# Patient Record
Sex: Male | Born: 1960 | Race: White | Hispanic: No | Smoking: Never smoker
Health system: Southern US, Community
[De-identification: ages and names within clinical notes are randomized; demographics above are authoritative.]

## PROBLEM LIST (undated history)

## (undated) DIAGNOSIS — Z972 Presence of dental prosthetic device (complete) (partial): Secondary | ICD-10-CM

## (undated) DIAGNOSIS — A0472 Enterocolitis due to Clostridium difficile, not specified as recurrent: Secondary | ICD-10-CM

## (undated) DIAGNOSIS — G2581 Restless legs syndrome: Secondary | ICD-10-CM

## (undated) DIAGNOSIS — F419 Anxiety disorder, unspecified: Secondary | ICD-10-CM

## (undated) DIAGNOSIS — F0390 Unspecified dementia without behavioral disturbance: Secondary | ICD-10-CM

## (undated) DIAGNOSIS — J449 Chronic obstructive pulmonary disease, unspecified: Secondary | ICD-10-CM

## (undated) DIAGNOSIS — D649 Anemia, unspecified: Secondary | ICD-10-CM

## (undated) DIAGNOSIS — I10 Essential (primary) hypertension: Secondary | ICD-10-CM

## (undated) DIAGNOSIS — Z8679 Personal history of other diseases of the circulatory system: Secondary | ICD-10-CM

## (undated) DIAGNOSIS — R0600 Dyspnea, unspecified: Secondary | ICD-10-CM

## (undated) DIAGNOSIS — C61 Malignant neoplasm of prostate: Secondary | ICD-10-CM

## (undated) HISTORY — PX: HERNIA REPAIR: SHX51

---

## 2013-02-04 ENCOUNTER — Emergency Department (HOSPITAL_COMMUNITY): Payer: Self-pay

## 2013-02-04 ENCOUNTER — Encounter (HOSPITAL_COMMUNITY): Payer: Self-pay | Admitting: *Deleted

## 2013-02-04 ENCOUNTER — Emergency Department (HOSPITAL_COMMUNITY)
Admission: EM | Admit: 2013-02-04 | Discharge: 2013-02-04 | Disposition: A | Discharge status: Home / Self Care | Payer: Self-pay | Attending: Emergency Medicine | Admitting: Emergency Medicine

## 2013-02-04 DIAGNOSIS — S42023A Displaced fracture of shaft of unspecified clavicle, initial encounter for closed fracture: Secondary | ICD-10-CM | POA: Insufficient documentation

## 2013-02-04 DIAGNOSIS — S42002A Fracture of unspecified part of left clavicle, initial encounter for closed fracture: Secondary | ICD-10-CM

## 2013-02-04 DIAGNOSIS — Y939 Activity, unspecified: Secondary | ICD-10-CM | POA: Insufficient documentation

## 2013-02-04 DIAGNOSIS — W11XXXA Fall on and from ladder, initial encounter: Secondary | ICD-10-CM | POA: Insufficient documentation

## 2013-02-04 DIAGNOSIS — S62102A Fracture of unspecified carpal bone, left wrist, initial encounter for closed fracture: Secondary | ICD-10-CM

## 2013-02-04 DIAGNOSIS — Y929 Unspecified place or not applicable: Secondary | ICD-10-CM | POA: Insufficient documentation

## 2013-02-04 DIAGNOSIS — S52599A Other fractures of lower end of unspecified radius, initial encounter for closed fracture: Secondary | ICD-10-CM | POA: Insufficient documentation

## 2013-02-04 IMAGING — CR DG FOREARM 2V*L*
2 series · 2 of 2 positions shown · non-contrast
Comparison: None.

CLINICAL DATA: Fall, left forearm pain, deformity.

LEFT FOREARM - 2 VIEW

[x forearm ap left]
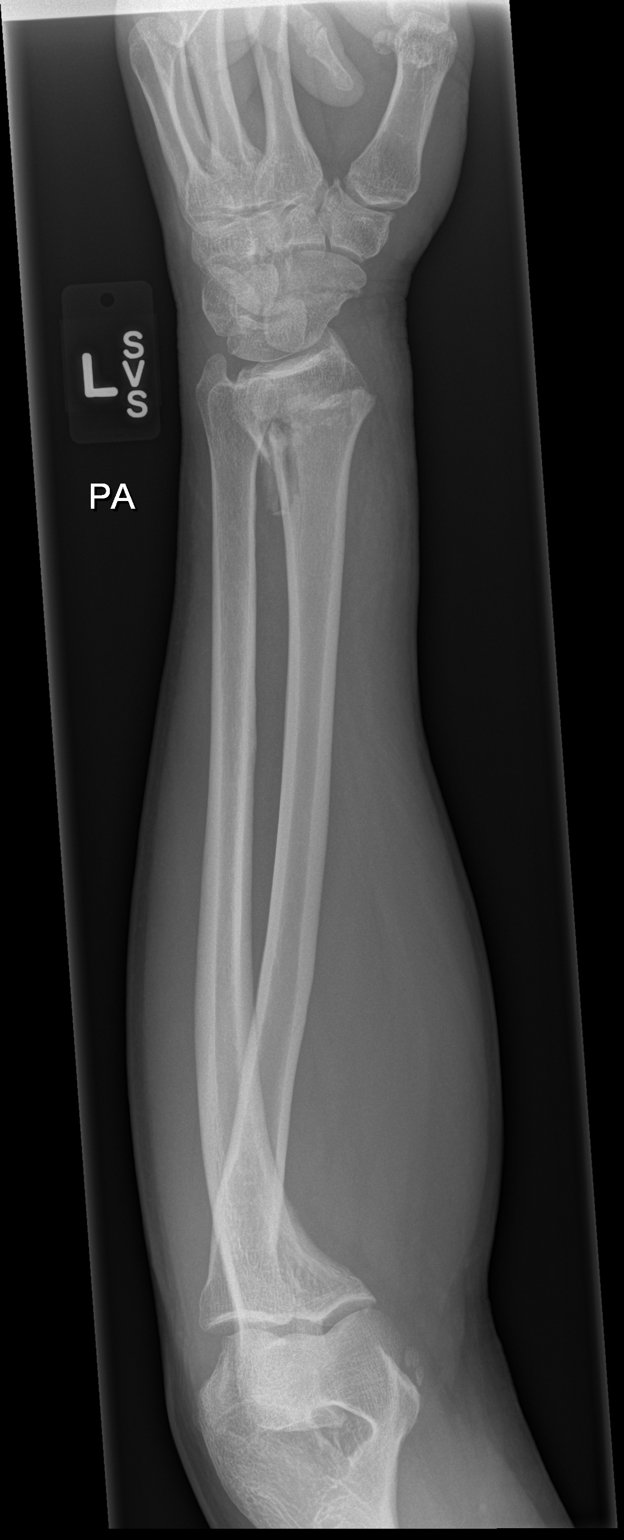

[x forearm lat left]
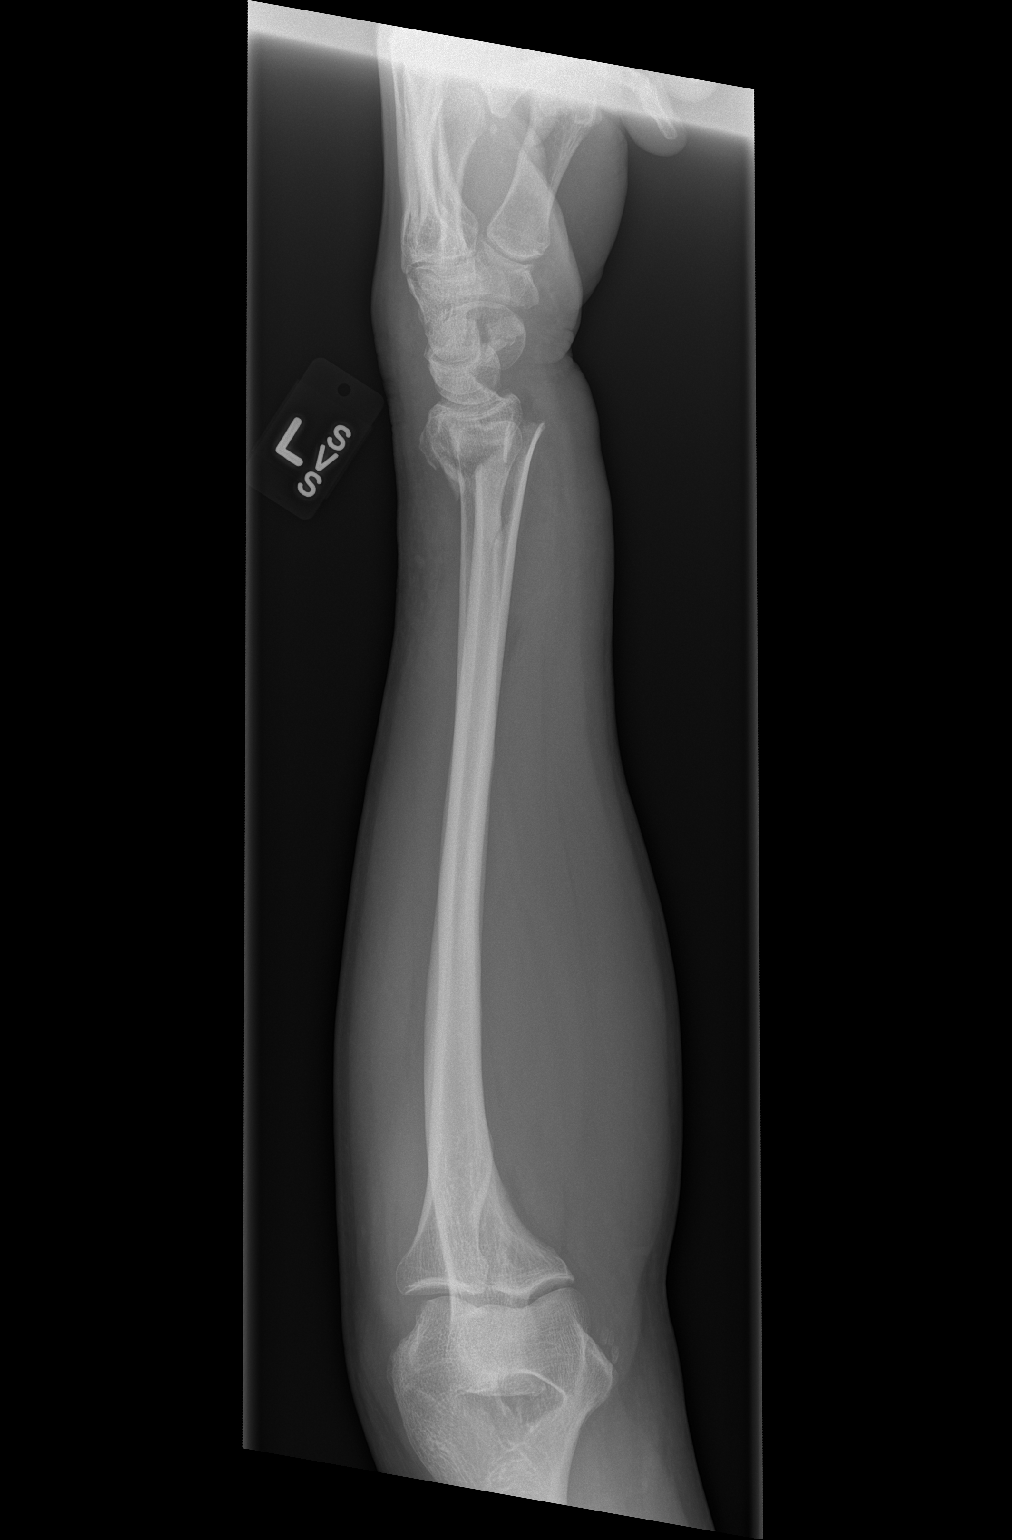

[2 of 2 positions shown; findings below may reference images not displayed]

FINDINGS: Comminuted, displaced and impacted fracture through the
distal left radius.  Distal fragment is displaced posteriorly
approximately 9 mm.  No visible ulnar abnormality.
IMPRESSION: Displaced, comminuted and impacted distal left radial fracture.

## 2013-02-04 IMAGING — CR DG CHEST 1V
1 series · 1 of 1 positions shown · non-contrast
Comparison: None.

***ADDENDUM*** CREATED: [DATE] [DATE]

After discussing the case with the clinician, the patient is tender
over the medial left clavicle.  While difficult to visualize, there
is a displaced fracture through the medial left clavicle.
***END ADDENDUM*** SIGNED BY: TIGER, M.D.
CLINICAL DATA: Fall, chest pain.  Arm injury.
CHEST - 1 VIEW

[x chest ap]
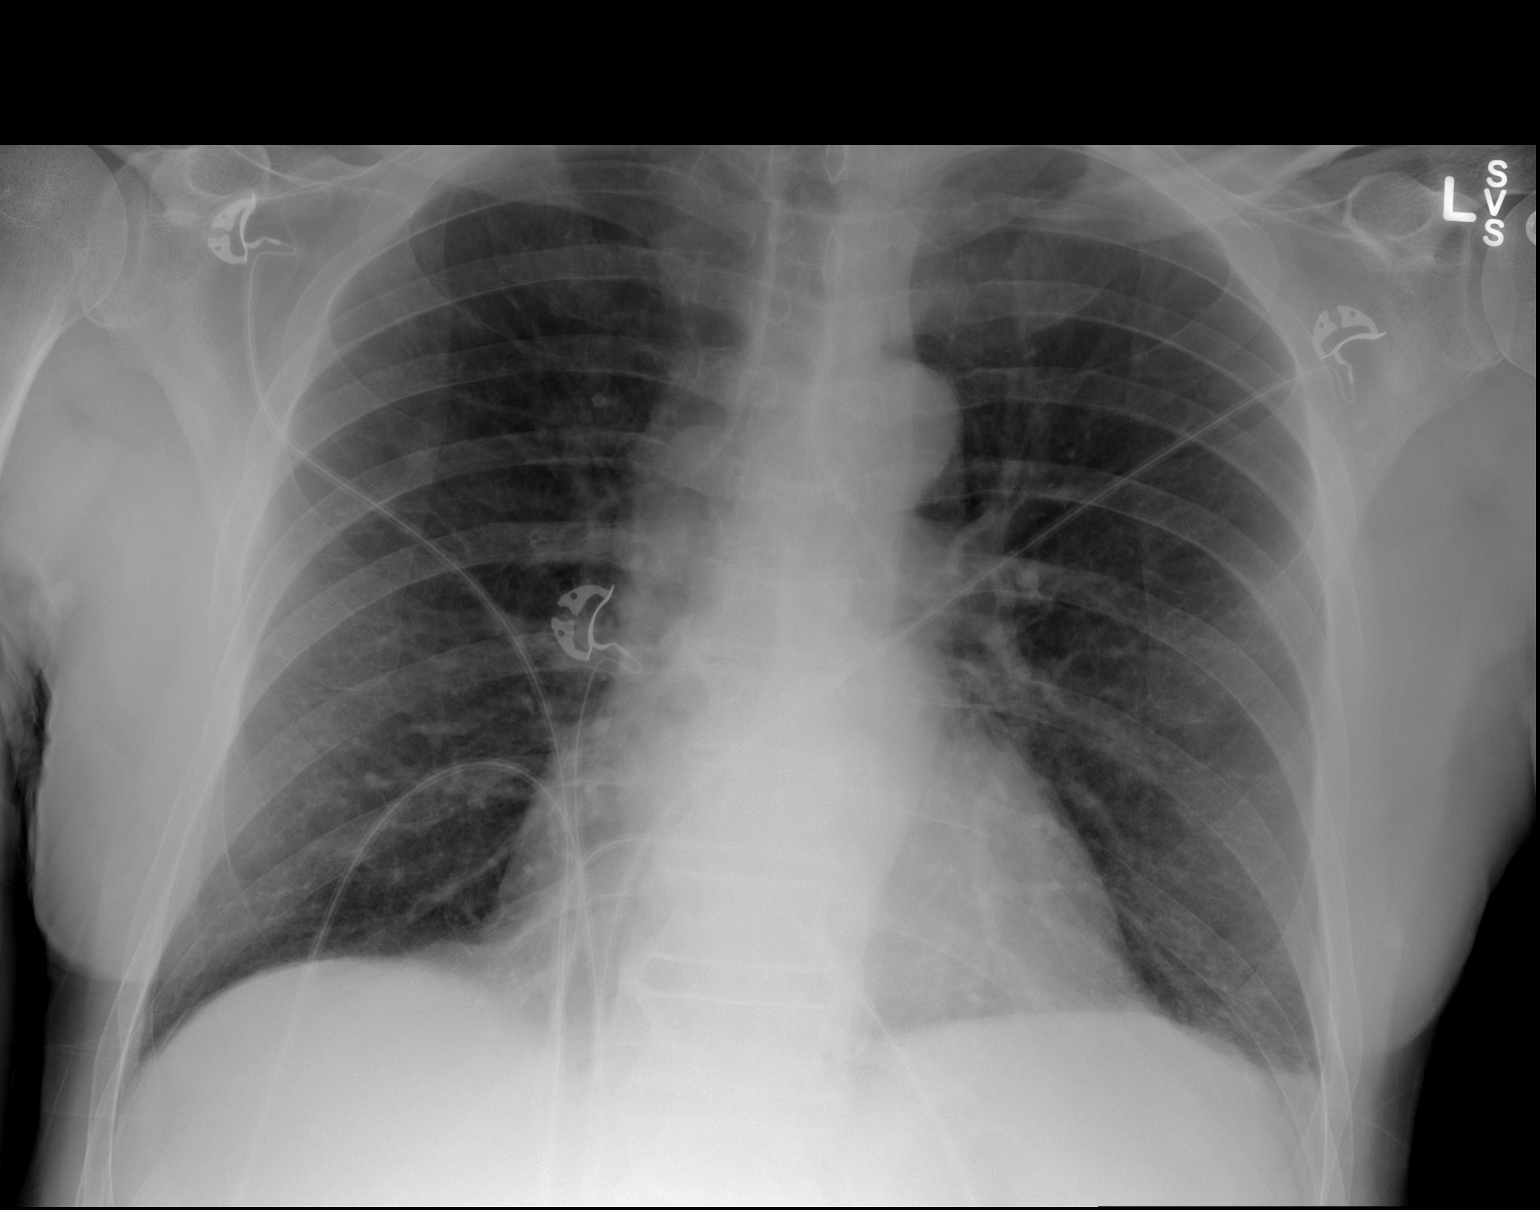

[1 of 1 positions shown; findings below may reference images not displayed]

FINDINGS: Heart is normal size.  No confluent airspace opacities or
effusions.  No acute bony abnormality.  No visualized rib fracture
or pneumothorax.
IMPRESSION: No acute cardiopulmonary disease.

## 2013-02-04 MED ORDER — OXYCODONE-ACETAMINOPHEN 5-325 MG PO TABS: 2.0000 | ORAL_TABLET | ORAL | Status: DC | PRN

## 2013-02-04 MED ORDER — OXYCODONE-ACETAMINOPHEN 5-325 MG PO TABS
2.0000 | ORAL_TABLET | Freq: Once | ORAL | Status: AC
  Administered 2013-02-04: 2 via ORAL
  Filled 2013-02-04: qty 2

## 2013-02-04 NOTE — ED Provider Notes (Signed)
History    CSN: [id number] Arrival date & time 02/04/13  2145  First MD Initiated Contact with Patient 02/04/13 2211     Chief Complaint  Patient presents with  . Fall  . Arm Injury    HPI Patient fell back off an 8 foot ladder and landed on left side.  Has deformity and pain to left forearm/wrist.  Also has some tenderness the left clavicle.  Patient denies shortness of breath abdominal pain denies head injury .  Denies spine or neck pain.  Denies back pain.  Denies paresthesias. History reviewed. No pertinent past medical history. History reviewed. No pertinent past surgical history. No family history on file. History  Substance Use Topics  . Smoking status: Never Smoker   . Smokeless tobacco: Not on file  . Alcohol Use: Yes     Comment: social    Review of Systems All other systems reviewed and are negative Allergies  Review of patient's allergies indicates no known allergies.  Home Medications   Current Outpatient Rx  Name  Route  Sig  Dispense  Refill  . ibuprofen (ADVIL,MOTRIN) 200 MG tablet   Oral   Take 600 mg by mouth every 6 (six) hours as needed for pain.         Marland Kitchen oxyCODONE-acetaminophen (PERCOCET/ROXICET) 5-325 MG per tablet   Oral   Take 2 tablets by mouth every 4 (four) hours as needed for pain.   30 tablet   0    BP 155/102  Pulse 70  Temp(Src) 97.9 F (36.6 C)  Resp 20  Wt 183 lb (83.008 kg)  SpO2 95% Physical Exam  Nursing note and vitals reviewed. Constitutional: He is oriented to person, place, and time. He appears well-developed and well-nourished. No distress.  HENT:  Head: Normocephalic and atraumatic.  Eyes: Pupils are equal, round, and reactive to light.  Neck: Normal range of motion.  Cardiovascular: Normal rate and intact distal pulses.   Pulmonary/Chest: No respiratory distress.  Abdominal: Normal appearance. He exhibits no distension.  Musculoskeletal:       Left shoulder: He exhibits swelling (Swelling and pain where  indicated).       Left wrist: He exhibits decreased range of motion, bony tenderness, swelling and deformity. He exhibits no laceration.       Arms: Neurological: He is alert and oriented to person, place, and time. No cranial nerve deficit.  Skin: Skin is warm and dry. No rash noted.  Psychiatric: He has a normal mood and affect. His behavior is normal.    ED Course  Procedures (including critical care time) Medications  oxyCODONE-acetaminophen (PERCOCET/ROXICET) 5-325 MG per tablet 2 tablet (2 tablets Oral Given 02/04/13 2240)    Labs Reviewed - No data to display Dg Chest 1 View  02/04/2013   **ADDENDUM** CREATED: 02/04/2013 22:43:46  After discussing the case with the clinician, the patient is tender over the medial left clavicle.  While difficult to visualize, there is a displaced fracture through the medial left clavicle.  **END ADDENDUM** SIGNED BY: Aubery Lapping. [place], M.D.  02/04/2013   *RADIOLOGY REPORT*  Clinical Data: Fall, chest pain.  Arm injury.  CHEST - 1 VIEW  Comparison: None.  Findings: Heart is normal size.  No confluent airspace opacities or effusions.  No acute bony abnormality.  No visualized rib fracture or pneumothorax.  IMPRESSION: No acute cardiopulmonary disease.   Original Report Authenticated By: Charlett Nose, M.D.   Dg Forearm Left  02/04/2013   *RADIOLOGY REPORT*  Clinical Data: Fall, left forearm pain, deformity.  LEFT FOREARM - 2 VIEW  Comparison: None.  Findings: Comminuted, displaced and impacted fracture through the distal left radius.  Distal fragment is displaced posteriorly approximately 9 mm.  No visible ulnar abnormality.  IMPRESSION: Displaced, comminuted and impacted distal left radial fracture.   Original Report Authenticated By: Charlett Nose, M.D.   1. Wrist fracture, left, closed, initial encounter   2. Clavicle fracture, left, closed, initial encounter     MDM  I discussed the case with Dr. Janee Morn who is on-call for hand surgery.  He reviewed the x-rays.   Request to place the patient in a sugar tong splint and sling.  He will see him in 9 AM in the morning.  Nelia Shi, MD 02/04/13 [phone]

## 2013-02-04 NOTE — ED Notes (Signed)
Pt refused IV start multiple times. States " I don't need an IV for a broke arm ma'am." pt informed that his type of injury requires close monitoring and IV is needed for possible hidden injuries. Pt continued to refuse. States "let me see the doctor first before y'all start sticking me with needles."

## 2013-02-04 NOTE — ED Notes (Addendum)
Pt fell straight back 34ft after ladder moved; landed on butt/left side/back; c/o left arm pain-- deformity; denies numbness tingling down legs; denies neck pain-- c collar placed; Dr Dierdre Highman made aware of injuries

## 2013-02-04 NOTE — ED Notes (Signed)
MD at bedside. 

## 2013-02-25 NOTE — Progress Notes (Signed)
No meds-has never had any surgery

## 2013-02-27 NOTE — H&P (Signed)
Dan Salazar is an 52 y.o. male.   CC / Reason for Visit: Left wrist and shoulder injury HPI: This patient is a 52 year old male who presents for evaluation having fallen off of a ladder at home.  He was evaluated in the emergency department yesterday, placed into a sugar tong splint for his distal radius and a sling for his clavicle fracture.  He has been taking pain medications and reports no numbness or tingling in the left hand digits  No past medical history on file.  No past surgical history on file.  No family history on file. Social History:  reports that he has never smoked. He does not have any smokeless tobacco history on file. He reports that  drinks alcohol. His drug history is not on file.  Allergies: No Known Allergies  No prescriptions prior to admission    No results found for this or any previous visit (from the past 48 hour(s)). No results found.  Review of Systems  All other systems reviewed and are negative.    Height 6\' 2"  (1.88 m), weight 83.008 kg (183 lb). Physical Exam  Constitutional:  WD, WN, NAD HEENT:  NCAT, EOMI Neuro/Psych:  Alert & oriented to person, place, and time; appropriate mood & affect Lymphatic: No generalized UE edema or lymphadenopathy Extremities / MSK:  Both UE are normal with respect to appearance, ranges of motion, joint stability, muscle strength/tone, sensation, & perfusion except as otherwise noted:  There is some swelling and tenderness in the mid to medial aspects of the left clavicle.  Overriding can be appreciated but there is no skin in jeopardy.  Left sided sugar tong splint is intact, digits are not significantly swollen and intact light touch sensation across all the digital tips with intact motor to the same  Labs / Xrays:  No radiographic studies obtained today.  X-rays from the time of injury are reviewed and reveal a displaced fracture of the medial midportion of the clavicle, as well as a dorsally translated and  displaced distal radius fracture.  Roughly 50% dorsal translation and 10-15 degrees dorsal tilt is noted  Assessment: 1.  Displaced left clavicle fracture 2.  Displaced left distal radius fracture  Plan:  I discussed these findings with him.  I recommended nonoperative treatment for the clavicle, but recommended ORIF for the radius fracture.  Plastic models were used to describe the injury, the altered alignment, the consequences of such, and the recommended course of action.  At this point, this could not be successfully closed reduced without significant anesthesia, and if one were to undergo anesthesia for closed reduction, I would recommend at least percutaneous pin fixation if not open treatment.  The patient is reticent to undergo any type of operative treatment and would like to seek a second opinion.  I offered to provide names and numbers of hand surgeons who could be consulted for second opinion and he declined.  In addition acute median neuropathy symptoms were discussed and the importance of relieving this conveyed.  He was instructed to seek immediate assistance if he developed numbness and tingling in the fingers.  As it stands, we will attempt to schedule surgery for next Wednesday, which he may cancel if he is not ready to proceed at that time for whatever reason.   Cele Mote A. 02/27/2013, 3:18 PM

## 2013-03-02 ENCOUNTER — Encounter (HOSPITAL_BASED_OUTPATIENT_CLINIC_OR_DEPARTMENT_OTHER): Payer: Self-pay | Admitting: Certified Registered Nurse Anesthetist

## 2013-03-02 ENCOUNTER — Ambulatory Visit (HOSPITAL_BASED_OUTPATIENT_CLINIC_OR_DEPARTMENT_OTHER)
Admission: RE | Admit: 2013-03-02 | Discharge: 2013-03-02 | Disposition: A | Discharge status: Home / Self Care | Payer: Self-pay | Source: Ambulatory Visit | Attending: Orthopedic Surgery | Admitting: Orthopedic Surgery

## 2013-03-02 ENCOUNTER — Encounter (HOSPITAL_BASED_OUTPATIENT_CLINIC_OR_DEPARTMENT_OTHER)
Admission: RE | Disposition: A | Discharge status: Home / Self Care | Payer: Self-pay | Source: Ambulatory Visit | Attending: Orthopedic Surgery

## 2013-03-02 ENCOUNTER — Encounter (HOSPITAL_BASED_OUTPATIENT_CLINIC_OR_DEPARTMENT_OTHER): Payer: Self-pay

## 2013-03-02 ENCOUNTER — Ambulatory Visit (HOSPITAL_BASED_OUTPATIENT_CLINIC_OR_DEPARTMENT_OTHER): Payer: Self-pay | Admitting: Certified Registered Nurse Anesthetist

## 2013-03-02 ENCOUNTER — Ambulatory Visit (HOSPITAL_COMMUNITY): Payer: Self-pay

## 2013-03-02 DIAGNOSIS — S52599A Other fractures of lower end of unspecified radius, initial encounter for closed fracture: Secondary | ICD-10-CM | POA: Insufficient documentation

## 2013-03-02 DIAGNOSIS — W11XXXA Fall on and from ladder, initial encounter: Secondary | ICD-10-CM | POA: Insufficient documentation

## 2013-03-02 DIAGNOSIS — Y92009 Unspecified place in unspecified non-institutional (private) residence as the place of occurrence of the external cause: Secondary | ICD-10-CM | POA: Insufficient documentation

## 2013-03-02 HISTORY — PX: OPEN REDUCTION INTERNAL FIXATION (ORIF) DISTAL RADIAL FRACTURE: SHX5989

## 2013-03-02 IMAGING — RF DG WRIST COMPLETE 3+V*L*
1 series · 4 of 4 positions shown · non-contrast
Comparison: [DATE]

CLINICAL DATA: Fracture

DG C-ARM 61-120 MIN,LEFT WRIST - COMPLETE 3+ VIEW

[Series 1: run · 4 of 4 slices shown]
[im 1/4]
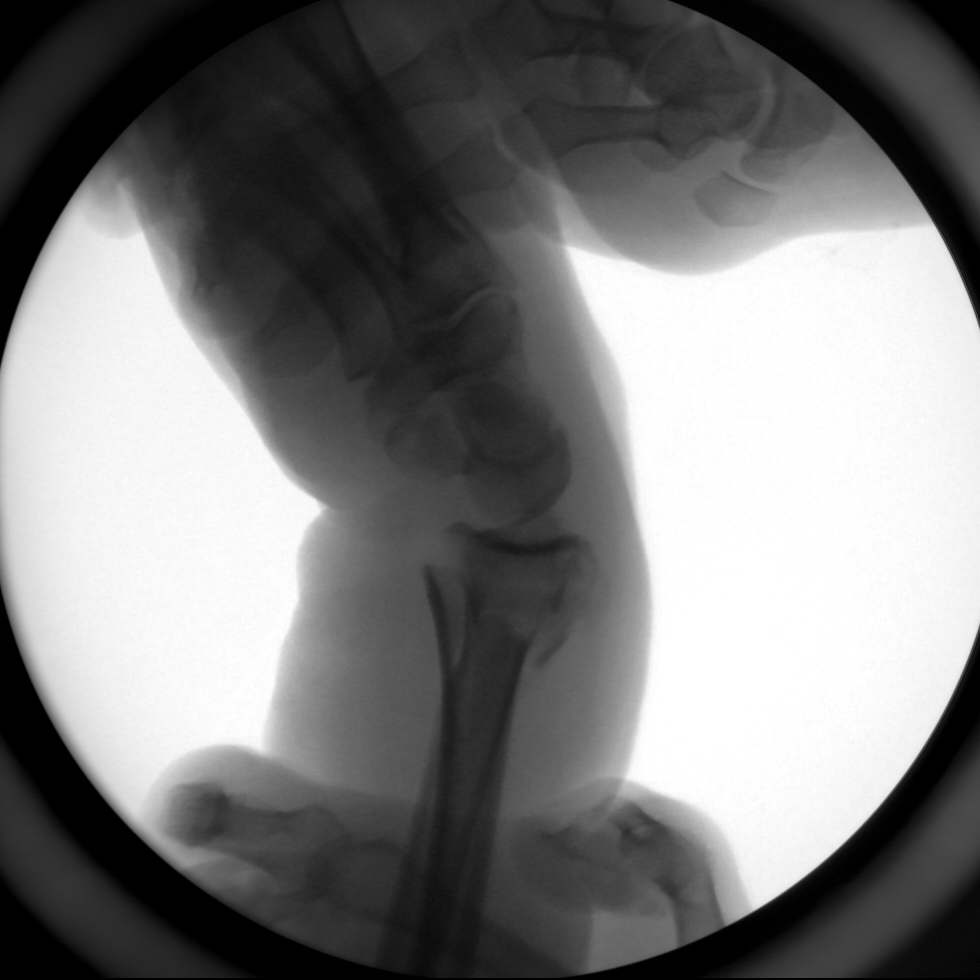
[im 2/4]
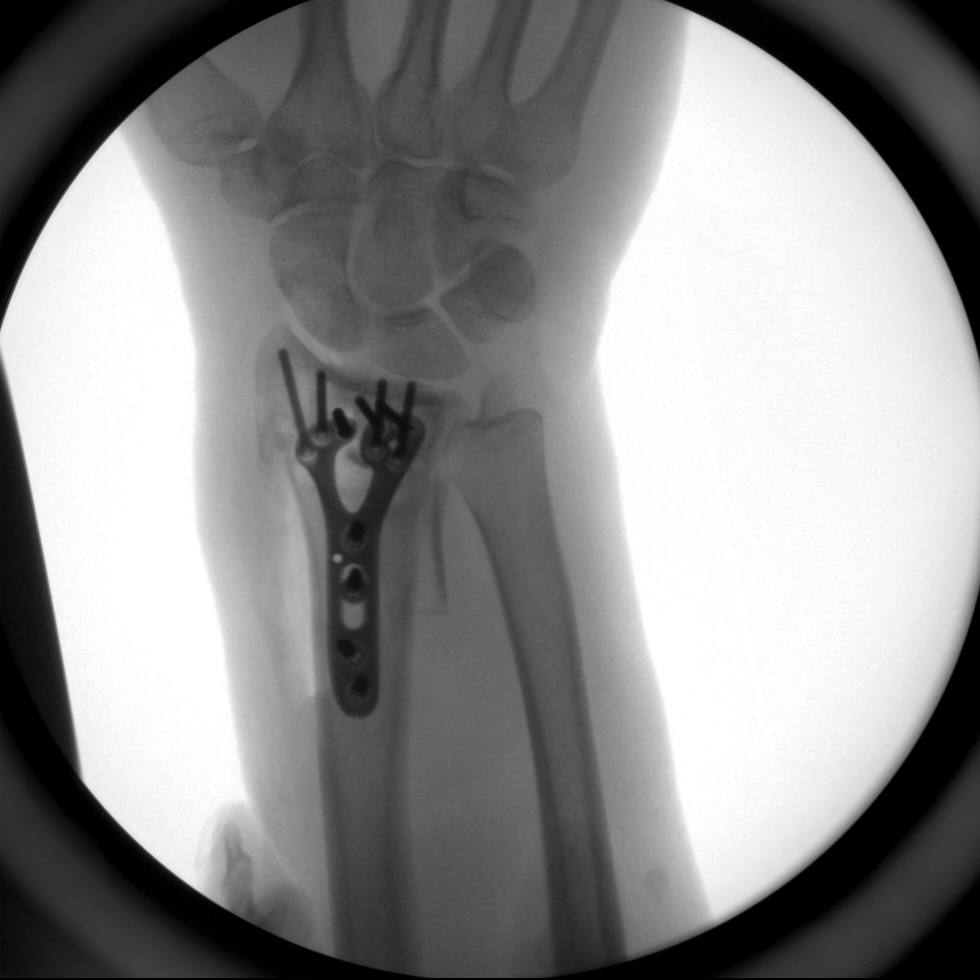
[im 3/4]
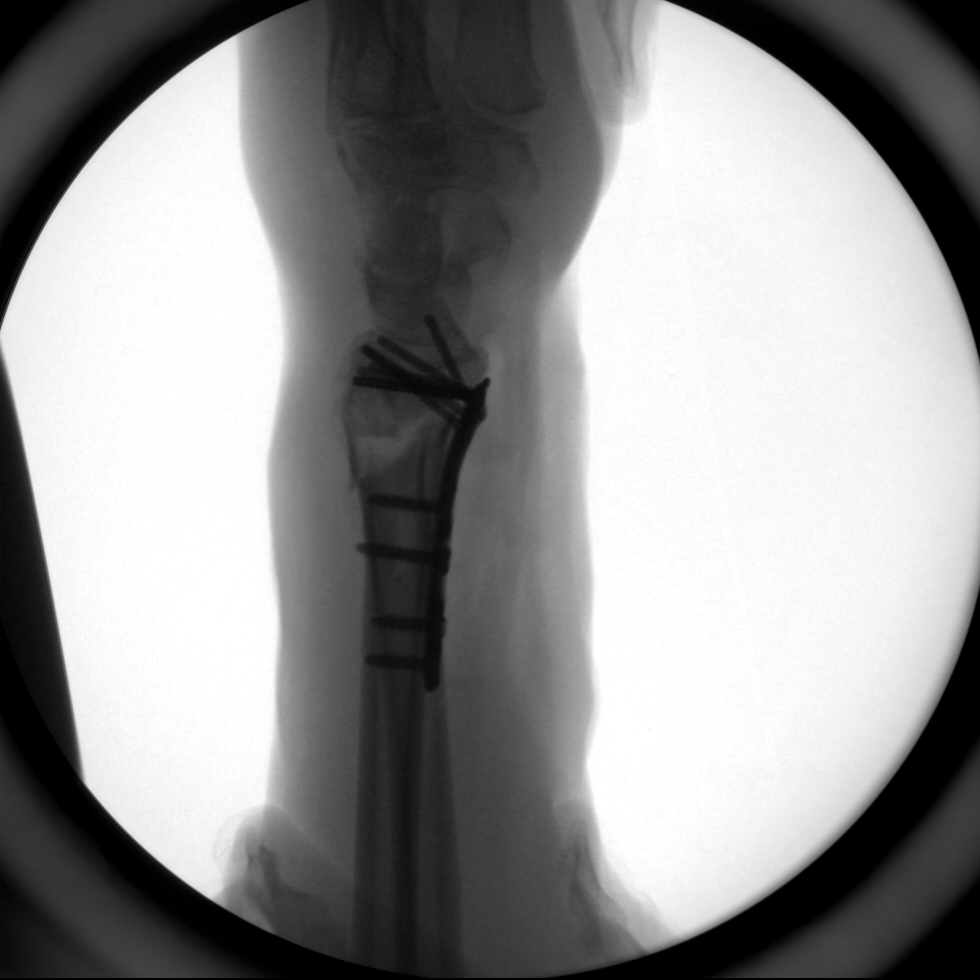
[im 4/4]
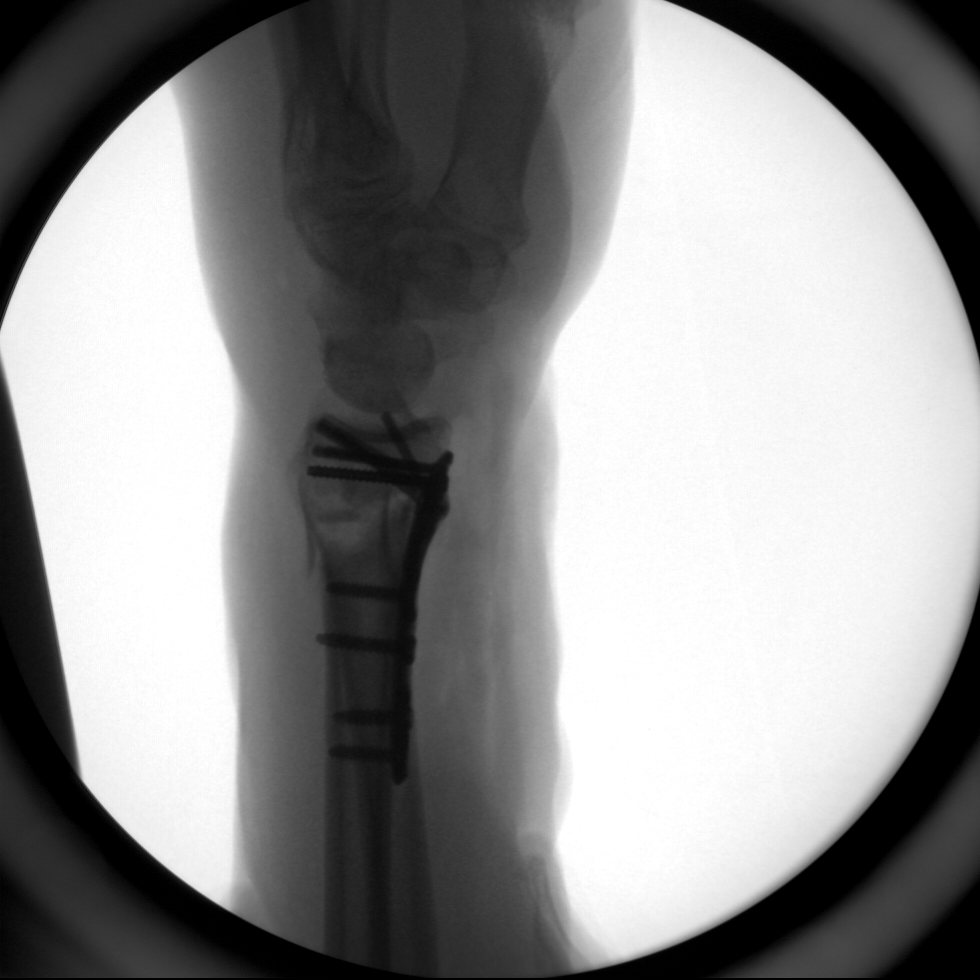

[4 of 4 positions shown; findings below may reference images not displayed]

FINDINGS: Four intraoperative C-arm spot images document reduction
of the comminuted distal radial fracture and fixation with plate
and screws, fragments projecting in near anatomic alignment.  Ulna
appears intact.
IMPRESSION: ORIF of distal radial fracture

## 2013-03-02 SURGERY — OPEN REDUCTION INTERNAL FIXATION (ORIF) DISTAL RADIUS FRACTURE
Anesthesia: General | Site: Wrist | Laterality: Left | Wound class: Clean

## 2013-03-02 MED ORDER — BUPIVACAINE-EPINEPHRINE 0.5% -1:200000 IJ SOLN
INTRAMUSCULAR | Status: DC | PRN
  Administered 2013-03-02: 10 mL

## 2013-03-02 MED ORDER — PROPOFOL 10 MG/ML IV BOLUS
INTRAVENOUS | Status: DC | PRN
  Administered 2013-03-02: 200 mg via INTRAVENOUS

## 2013-03-02 MED ORDER — HYDROMORPHONE HCL PF 1 MG/ML IJ SOLN
0.2500 mg | INTRAMUSCULAR | Status: DC | PRN
  Administered 2013-03-02 (×4): 0.5 mg via INTRAVENOUS

## 2013-03-02 MED ORDER — FENTANYL CITRATE 0.05 MG/ML IJ SOLN
INTRAMUSCULAR | Status: DC | PRN
  Administered 2013-03-02: 25 ug via INTRAVENOUS
  Administered 2013-03-02: 50 ug via INTRAVENOUS
  Administered 2013-03-02: 25 ug via INTRAVENOUS
  Administered 2013-03-02: 50 ug via INTRAVENOUS
  Administered 2013-03-02: 25 ug via INTRAVENOUS
  Administered 2013-03-02: 100 ug via INTRAVENOUS
  Administered 2013-03-02: 50 ug via INTRAVENOUS

## 2013-03-02 MED ORDER — FENTANYL CITRATE 0.05 MG/ML IJ SOLN: 50.0000 ug | INTRAMUSCULAR | Status: DC | PRN

## 2013-03-02 MED ORDER — LIDOCAINE HCL (CARDIAC) 20 MG/ML IV SOLN
INTRAVENOUS | Status: DC | PRN
  Administered 2013-03-02: 60 mg via INTRAVENOUS

## 2013-03-02 MED ORDER — LACTATED RINGERS IV SOLN
INTRAVENOUS | Status: DC
  Administered 2013-03-02 (×2): via INTRAVENOUS

## 2013-03-02 MED ORDER — DEXAMETHASONE SODIUM PHOSPHATE 10 MG/ML IJ SOLN
INTRAMUSCULAR | Status: DC | PRN
  Administered 2013-03-02: 10 mg via INTRAVENOUS

## 2013-03-02 MED ORDER — 0.9 % SODIUM CHLORIDE (POUR BTL) OPTIME
TOPICAL | Status: DC | PRN
  Administered 2013-03-02: 1000 mL

## 2013-03-02 MED ORDER — MIDAZOLAM HCL 2 MG/2ML IJ SOLN: 1.0000 mg | INTRAMUSCULAR | Status: DC | PRN

## 2013-03-02 MED ORDER — MIDAZOLAM HCL 5 MG/5ML IJ SOLN
INTRAMUSCULAR | Status: DC | PRN
  Administered 2013-03-02: 1 mg via INTRAVENOUS

## 2013-03-02 MED ORDER — ONDANSETRON HCL 4 MG/2ML IJ SOLN
INTRAMUSCULAR | Status: DC | PRN
  Administered 2013-03-02: 4 mg via INTRAVENOUS

## 2013-03-02 MED ORDER — OXYCODONE HCL 5 MG PO TABS
5.0000 mg | ORAL_TABLET | Freq: Once | ORAL | Status: AC | PRN
  Administered 2013-03-02: 5 mg via ORAL

## 2013-03-02 MED ORDER — OXYCODONE HCL 5 MG/5ML PO SOLN: 5.0000 mg | Freq: Once | ORAL | Status: AC | PRN

## 2013-03-02 MED ORDER — CEFAZOLIN SODIUM-DEXTROSE 2-3 GM-% IV SOLR
2.0000 g | INTRAVENOUS | Status: AC
  Administered 2013-03-02: 2 g via INTRAVENOUS

## 2013-03-02 MED ORDER — ONDANSETRON HCL 4 MG/2ML IJ SOLN: 4.0000 mg | Freq: Once | INTRAMUSCULAR | Status: DC | PRN

## 2013-03-02 MED ORDER — OXYCODONE-ACETAMINOPHEN 5-325 MG PO TABS: 1.0000 | ORAL_TABLET | ORAL | Status: DC | PRN

## 2013-03-02 SURGICAL SUPPLY — 53 items
BANDAGE COBAN STERILE 2 (GAUZE/BANDAGES/DRESSINGS) IMPLANT
BANDAGE GAUZE ELAST BULKY 4 IN (GAUZE/BANDAGES/DRESSINGS) ×4 IMPLANT
BLADE MINI RND TIP GREEN BEAV (BLADE) IMPLANT
BLADE SURG 15 STRL LF DISP TIS (BLADE) ×2 IMPLANT
BLADE SURG 15 STRL SS (BLADE) ×2
BNDG COHESIVE 4X5 TAN STRL (GAUZE/BANDAGES/DRESSINGS) ×2 IMPLANT
BNDG ESMARK 4X9 LF (GAUZE/BANDAGES/DRESSINGS) ×2 IMPLANT
BRUSH SCRUB EZ PLAIN DRY (MISCELLANEOUS) ×2 IMPLANT
CANISTER SUCTION 1200CC (MISCELLANEOUS) ×2 IMPLANT
CHLORAPREP W/TINT 26ML (MISCELLANEOUS) ×2 IMPLANT
CORDS BIPOLAR (ELECTRODE) ×2 IMPLANT
COVER MAYO STAND STRL (DRAPES) ×2 IMPLANT
COVER TABLE BACK 60X90 (DRAPES) ×2 IMPLANT
CUFF TOURNIQUET SINGLE 18IN (TOURNIQUET CUFF) ×2 IMPLANT
CUFF TOURNIQUET SINGLE 24IN (TOURNIQUET CUFF) IMPLANT
DRAPE C-ARM 42X72 X-RAY (DRAPES) ×2 IMPLANT
DRAPE EXTREMITY T 121X128X90 (DRAPE) ×2 IMPLANT
DRAPE SURG 17X23 STRL (DRAPES) ×2 IMPLANT
DRILL, SOLID SIDE CUTTING, 2.0MM X 40MM ×2 IMPLANT
DRILL, SOLID SIDE CUTTING, 2.5MM X 40MM ×2 IMPLANT
DRSG ADAPTIC 3X8 NADH LF (GAUZE/BANDAGES/DRESSINGS) ×2 IMPLANT
DRSG EMULSION OIL 3X3 NADH (GAUZE/BANDAGES/DRESSINGS) ×2 IMPLANT
ELECT REM PT RETURN 9FT ADLT (ELECTROSURGICAL) ×2
ELECTRODE REM PT RTRN 9FT ADLT (ELECTROSURGICAL) ×1 IMPLANT
GLOVE BIO SURGEON STRL SZ 6.5 (GLOVE) ×2 IMPLANT
GLOVE BIO SURGEON STRL SZ7.5 (GLOVE) ×2 IMPLANT
GLOVE BIOGEL PI IND STRL 7.0 (GLOVE) ×1 IMPLANT
GLOVE BIOGEL PI IND STRL 8 (GLOVE) ×1 IMPLANT
GLOVE BIOGEL PI INDICATOR 7.0 (GLOVE) ×1
GLOVE BIOGEL PI INDICATOR 8 (GLOVE) ×1
GOWN PREVENTION PLUS XLARGE (GOWN DISPOSABLE) ×4 IMPLANT
K-WIRE STANDARD 9MM X 152MM ×10 IMPLANT
NEEDLE HYPO 25X1 1.5 SAFETY (NEEDLE) IMPLANT
NS IRRIG 1000ML POUR BTL (IV SOLUTION) ×2 IMPLANT
PACK BASIN DAY SURGERY FS (CUSTOM PROCEDURE TRAY) ×2 IMPLANT
PAD CAST 4YDX4 CTTN HI CHSV (CAST SUPPLIES) ×1 IMPLANT
PADDING CAST ABS 4INX4YD NS (CAST SUPPLIES) ×1
PADDING CAST ABS COTTON 4X4 ST (CAST SUPPLIES) ×1 IMPLANT
PADDING CAST COTTON 4X4 STRL (CAST SUPPLIES) ×1
PENCIL BUTTON HOLSTER BLD 10FT (ELECTRODE) ×2 IMPLANT
RUBBERBAND STERILE (MISCELLANEOUS) IMPLANT
SKELETAL DYNAMICS DVR SET (Set) ×2 IMPLANT
SLEEVE SCD COMPRESS KNEE MED (MISCELLANEOUS) ×2 IMPLANT
SPONGE GAUZE 4X4 12PLY (GAUZE/BANDAGES/DRESSINGS) ×2 IMPLANT
STOCKINETTE 4X48 STRL (DRAPES) ×2 IMPLANT
SUT VIC AB 2-0 CT3 27 (SUTURE) ×2 IMPLANT
SUT VICRYL 4-0 PS2 18IN ABS (SUTURE) IMPLANT
SUT VICRYL RAPIDE 4/0 PS 2 (SUTURE) ×2 IMPLANT
SYR BULB 3OZ (MISCELLANEOUS) ×2 IMPLANT
SYRINGE 10CC LL (SYRINGE) IMPLANT
TOWEL OR 17X24 6PK STRL BLUE (TOWEL DISPOSABLE) ×2 IMPLANT
TOWEL OR NON WOVEN STRL DISP B (DISPOSABLE) ×2 IMPLANT
UNDERPAD 30X30 INCONTINENT (UNDERPADS AND DIAPERS) ×2 IMPLANT

## 2013-03-02 NOTE — Interval H&P Note (Signed)
History and Physical Interval Note:  03/02/2013 9:24 AM  Dan Salazar  has presented today for surgery, with the diagnosis of Left Distal Radius Fracture  The various methods of treatment have been discussed with the patient and family. After consideration of risks, benefits and other options for treatment, the patient has consented to  Procedure(s): OPEN REDUCTION INTERNAL FIXATION (ORIF) LEFT DISTAL RADIAL FRACTURE (Left) as a surgical intervention .  The patient's history has been reviewed, patient examined, no change in status, stable for surgery.  I have reviewed the patient's chart and labs.  Questions were answered to the patient's satisfaction.     [name] A.

## 2013-03-02 NOTE — Transfer of Care (Signed)
Immediate Anesthesia Transfer of Care Note  Patient: Dan Salazar  Procedure(s) Performed: Procedure(s): OPEN REDUCTION INTERNAL FIXATION (ORIF) LEFT DISTAL RADIAL FRACTURE (Left)  Patient Location: PACU  Anesthesia Type:General  Level of Consciousness: awake, alert , oriented and patient cooperative  Airway & Oxygen Therapy: Patient Spontanous Breathing and Patient connected to face mask oxygen  Post-op Assessment: Report given to PACU RN and Post -op Vital signs reviewed and stable  Post vital signs: Reviewed and stable  Complications: No apparent anesthesia complications

## 2013-03-02 NOTE — Op Note (Signed)
03/02/2013  9:42 AM  PATIENT:  Dan Salazar  52 y.o. male  PRE-OPERATIVE DIAGNOSIS:  Displaced left distal radius fracture  POST-OPERATIVE DIAGNOSIS:  Same  PROCEDURE:  ORIF left distal radius fracture  SURGEON: Cliffton Asters. Janee Morn, MD  PHYSICIAN ASSISTANT: None  ANESTHESIA:  general  SPECIMENS:  None  DRAINS:   None  PREOPERATIVE INDICATIONS:  [name] is a  52 y.o. male with a diagnosis of displaced left distal radius fracture who initially desired to continue nonoperative management seek a second opinion, and ultimately changed his intentions and agreed to proceed operatively.  The risks benefits and alternatives were discussed with the patient preoperatively including but not limited to the risks of infection, bleeding, nerve injury, cardiopulmonary complications, the need for revision surgery, among others, and the patient verbalized understanding and consented to proceed.  OPERATIVE IMPLANTS: Skeletal dynamics standard left plate, all distal holes drilled and filled with smooth pegs except for one multidirectional screw. 2 proximal holes filled with compressing screws, 2 with locking screws.  OPERATIVE FINDINGS: Distal radius fracture with dorsal and proximal displacement, near-anatomic alignment following reduction and fixation.  OPERATIVE PROCEDURE:  After receiving prophylactic antibiotics, the patient was escorted to the operative theatre and placed in a supine position.  A surgical "time-out" was performed during which the planned procedure, proposed operative site, and the correct patient identity were compared to the operative consent and agreement confirmed by the circulating nurse according to current facility policy.  An attempt was made at a closed reduction under fluoroscopy, and acceptable alignment could not be obtained. Decision was made to proceed with open treatment. Following application of a tourniquet to the operative extremity, splint was removed and the the  exposed skin was pre-scrubbed with a Hibiclens scrub brush before being formally prepped with Chloraprep and draped in the usual sterile fashion.  The limb was exsanguinated with an Esmarch bandage and the tourniquet inflated to approximately higher than systolic BP.  A curvilinear sinusoidal-shaped incision was made over the FCR. The skin was incised sharply with a scalpel. The FCR axis was exploited deeply and it was retracted radially. The contents of the carpal canal were retracted ulnarly. The pronator quadratus was reflected in an L-shaped ulnarly. The fracture had shredded it, allowing for 2 separate leaflets to be reflected ulnarly do to the axial a transverse nature of the disruption of the muscle. The brachial radialis was split in a Z-plasty fashion for later reapproximation. Work was set about to  free the distal fracture fragments from the shaft. The extended FCR approach was employed allowing person stripping of the dorsal periosteum off the radius and some dividing of it to allow for gains in length to be made. The fracture was provisionally reduced but still had dorsal tilt. The plate was affixed to the volar surface of the bone and secured with a screw in the slotted hole. A couple of attempts were made to place distal hardware by first inserting wires, and it was determined that the plate still was at too far distally as all the hardware was removed, the plate repositioned slightly proximal and secured in that fashion. Now the distal holes could be drilled and filled with hardware remaining juxta articular and not intra-articular. All the holes were filled with smooth pegs deliberately on the short side of the measurements. One take would not thread into the plate and so he multidirectional screw was placed instead. Satisfied with placement of the hardware but recognizing that perhaps the fracture was  still slightly short the plate was slid distally thus moving the distal fracture  fragments and then the plate was tightened and the new position and the other proximal holes drilled and filled. Once satisfied with the reduction and fixation, final fluoroscopic images were obtained and the wound is copiously irrigated. The brachial radialis was repaired with 2-0 Vicryl sutures followed by repair the pronator quadratus which covered the plate nicely. Tourniquet was released and additional hemostasis obtained and the tourniquet was reinflated. The skin was closed with 2-0 Vicryl deep dermal buried sutures followed by running 4-0 Vicryl Rapide horizontal mattress suture and the skin. The DRUJ he had been assessed and found to be stable following reduction and fixation of the fracture. Therefore the limb was dressed with a short arm dressing allowing for free elbow motion and forearm rotation. It contained a volar plaster component. Prior to dressing application 10 mL's of half percent Marcaine with epinephrine was instilled around the incision to help with postoperative pain. He was awakened and taken to the recovery room stable condition breathing spontaneously  DISPOSITION: he will be discharged home today with instructions to work vigorously on digital range of motion exercises.  Because this has been somewhat neglected over the past 2-3 weeks, he will return next week to therapy to have a splint constructed and begin his rehabilitation, and return to see me the following week for a wound check and new x-rays, including an incline lateral.

## 2013-03-02 NOTE — Interval H&P Note (Signed)
History and Physical Interval Note:  03/02/2013 9:24 AM  Dan Salazar  has presented today for surgery, with the diagnosis of Left Distal Radius Fracture  The various methods of treatment have been discussed with the patient and family. After consideration of risks, benefits and other options for treatment, the patient has consented to  Procedure(s): OPEN REDUCTION INTERNAL FIXATION (ORIF) LEFT DISTAL RADIAL FRACTURE (Left) as a surgical intervention .  However, I discussed with him a plan to try to obtain a closed reduction with percutaneous pinning if possible, but I predicted a low liklihood of success for that procedure at this time--likely requiring ORIF to which he has consented.  The patient's history has been reviewed, patient examined, no change in status, stable for surgery.  I have reviewed the patient's chart and labs.  Questions were answered to the patient's satisfaction.     Gabrielly Mccrystal A.

## 2013-03-02 NOTE — Anesthesia Postprocedure Evaluation (Signed)
  Anesthesia Post-op Note  Patient: Dan Salazar  Procedure(s) Performed: Procedure(s): OPEN REDUCTION INTERNAL FIXATION (ORIF) LEFT DISTAL RADIAL FRACTURE (Left)  Patient Location: PACU  Anesthesia Type:General  Level of Consciousness: awake, alert  and oriented  Airway and Oxygen Therapy: Patient Spontanous Breathing  Post-op Pain: mild  Post-op Assessment: Post-op Vital signs reviewed  Post-op Vital Signs: Reviewed  Complications: No apparent anesthesia complications

## 2013-03-02 NOTE — Anesthesia Preprocedure Evaluation (Signed)
Anesthesia Evaluation  Patient identified by MRN, date of birth, ID band Patient awake    Reviewed: Allergy & Precautions, H&P , NPO status , Patient's Chart, lab work & pertinent test results  Airway Mallampati: I TM Distance: >3 FB Neck ROM: Full    Dental  (+) Upper Dentures, Partial Lower and Dental Advisory Given   Pulmonary  breath sounds clear to auscultation        Cardiovascular Rhythm:Regular Rate:Normal     Neuro/Psych    GI/Hepatic   Endo/Other    Renal/GU      Musculoskeletal   Abdominal   Peds  Hematology   Anesthesia Other Findings   Reproductive/Obstetrics                           Anesthesia Physical Anesthesia Plan  ASA: I  Anesthesia Plan: General   Post-op Pain Management:    Induction: Intravenous  Airway Management Planned: LMA  Additional Equipment:   Intra-op Plan:   Post-operative Plan: Extubation in OR  Informed Consent: I have reviewed the patients History and Physical, chart, labs and discussed the procedure including the risks, benefits and alternatives for the proposed anesthesia with the patient or authorized representative who has indicated his/her understanding and acceptance.   Dental advisory given  Plan Discussed with: CRNA, Anesthesiologist and Surgeon  Anesthesia Plan Comments:         Anesthesia Quick Evaluation

## 2013-03-02 NOTE — H&P (View-Only) (Signed)
No meds-has never had any surgery 

## 2013-03-04 ENCOUNTER — Encounter (HOSPITAL_BASED_OUTPATIENT_CLINIC_OR_DEPARTMENT_OTHER): Payer: Self-pay | Admitting: Orthopedic Surgery

## 2016-09-28 ENCOUNTER — Inpatient Hospital Stay (HOSPITAL_COMMUNITY)
Admission: EM | Admit: 2016-09-28 | Discharge: 2016-09-30 | DRG: 641 | Disposition: A | Discharge status: Home / Self Care | Payer: Self-pay | Attending: Family Medicine | Admitting: Family Medicine

## 2016-09-28 ENCOUNTER — Encounter (HOSPITAL_COMMUNITY): Payer: Self-pay | Admitting: *Deleted

## 2016-09-28 ENCOUNTER — Emergency Department (HOSPITAL_COMMUNITY): Payer: Self-pay

## 2016-09-28 ENCOUNTER — Inpatient Hospital Stay (HOSPITAL_COMMUNITY): Payer: Self-pay

## 2016-09-28 DIAGNOSIS — D649 Anemia, unspecified: Secondary | ICD-10-CM | POA: Diagnosis present

## 2016-09-28 DIAGNOSIS — M8588 Other specified disorders of bone density and structure, other site: Secondary | ICD-10-CM | POA: Diagnosis present

## 2016-09-28 DIAGNOSIS — E871 Hypo-osmolality and hyponatremia: Secondary | ICD-10-CM | POA: Diagnosis present

## 2016-09-28 DIAGNOSIS — I10 Essential (primary) hypertension: Secondary | ICD-10-CM | POA: Diagnosis present

## 2016-09-28 DIAGNOSIS — R739 Hyperglycemia, unspecified: Secondary | ICD-10-CM | POA: Diagnosis present

## 2016-09-28 DIAGNOSIS — S22000A Wedge compression fracture of unspecified thoracic vertebra, initial encounter for closed fracture: Secondary | ICD-10-CM

## 2016-09-28 DIAGNOSIS — E873 Alkalosis: Secondary | ICD-10-CM | POA: Diagnosis present

## 2016-09-28 DIAGNOSIS — E878 Other disorders of electrolyte and fluid balance, not elsewhere classified: Secondary | ICD-10-CM | POA: Diagnosis present

## 2016-09-28 DIAGNOSIS — N179 Acute kidney failure, unspecified: Secondary | ICD-10-CM | POA: Diagnosis present

## 2016-09-28 DIAGNOSIS — E876 Hypokalemia: Principal | ICD-10-CM | POA: Diagnosis present

## 2016-09-28 DIAGNOSIS — I491 Atrial premature depolarization: Secondary | ICD-10-CM | POA: Diagnosis present

## 2016-09-28 DIAGNOSIS — M4854XA Collapsed vertebra, not elsewhere classified, thoracic region, initial encounter for fracture: Secondary | ICD-10-CM | POA: Diagnosis present

## 2016-09-28 HISTORY — DX: Essential (primary) hypertension: I10

## 2016-09-28 LAB — I-STAT VENOUS BLOOD GAS, ED
Acid-Base Excess: 15 mmol/L — ABNORMAL HIGH (ref 0.0–2.0)
BICARBONATE: 39.6 mmol/L — AB (ref 20.0–28.0)
O2 Saturation: 53 %
PCO2 VEN: 49.2 mmHg (ref 44.0–60.0)
PH VEN: 7.515 — AB (ref 7.250–7.430)
PO2 VEN: 26 mmHg — AB (ref 32.0–45.0)
TCO2: 41 mmol/L (ref 0–100)

## 2016-09-28 LAB — RENAL FUNCTION PANEL
ALBUMIN: 3.4 g/dL — AB (ref 3.5–5.0)
ANION GAP: 11 (ref 5–15)
Albumin: 3.3 g/dL — ABNORMAL LOW (ref 3.5–5.0)
Albumin: 3.1 g/dL — ABNORMAL LOW (ref 3.5–5.0)
Anion gap: 12 (ref 5–15)
Anion gap: 9 (ref 5–15)
BUN: 9 mg/dL (ref 6–20)
BUN: 11 mg/dL (ref 6–20)
BUN: 11 mg/dL (ref 6–20)
CALCIUM: 8.8 mg/dL — AB (ref 8.9–10.3)
CO2: 36 mmol/L — AB (ref 22–32)
CO2: 36 mmol/L — ABNORMAL HIGH (ref 22–32)
CO2: 37 mmol/L — ABNORMAL HIGH (ref 22–32)
Calcium: 8.7 mg/dL — ABNORMAL LOW (ref 8.9–10.3)
Calcium: 8.7 mg/dL — ABNORMAL LOW (ref 8.9–10.3)
Chloride: 80 mmol/L — ABNORMAL LOW (ref 101–111)
Chloride: 80 mmol/L — ABNORMAL LOW (ref 101–111)
Chloride: 85 mmol/L — ABNORMAL LOW (ref 101–111)
Creatinine, Ser: 1.11 mg/dL (ref 0.61–1.24)
Creatinine, Ser: 1.21 mg/dL (ref 0.61–1.24)
Creatinine, Ser: 1.27 mg/dL — ABNORMAL HIGH (ref 0.61–1.24)
GFR calc Af Amer: >60 mL/min (ref >60)
GFR calc Af Amer: >60 mL/min
GFR calc Af Amer: >60 mL/min
GFR calc non Af Amer: >60 mL/min (ref >60)
GFR calc non Af Amer: >60 mL/min
GFR calc non Af Amer: >60 mL/min
GLUCOSE: 111 mg/dL — AB (ref 65–99)
Glucose, Bld: 109 mg/dL — ABNORMAL HIGH (ref 65–99)
Glucose, Bld: 111 mg/dL — ABNORMAL HIGH (ref 65–99)
POTASSIUM: 2.1 mmol/L — AB (ref 3.5–5.1)
Phosphorus: 3.2 mg/dL (ref 2.5–4.6)
Phosphorus: 3.9 mg/dL (ref 2.5–4.6)
Phosphorus: 3.3 mg/dL (ref 2.5–4.6)
Potassium: 2.2 mmol/L — CL (ref 3.5–5.1)
Potassium: 2.6 mmol/L — CL (ref 3.5–5.1)
SODIUM: 127 mmol/L — AB (ref 135–145)
Sodium: 128 mmol/L — ABNORMAL LOW (ref 135–145)
Sodium: 131 mmol/L — ABNORMAL LOW (ref 135–145)

## 2016-09-28 LAB — MAGNESIUM: MAGNESIUM: 1.8 mg/dL (ref 1.7–2.4)

## 2016-09-28 LAB — COMPREHENSIVE METABOLIC PANEL
ALBUMIN: 3.5 g/dL (ref 3.5–5.0)
ALT: 8 U/L — AB (ref 17–63)
AST: 14 U/L — AB (ref 15–41)
Alkaline Phosphatase: 58 U/L (ref 38–126)
Anion gap: 13 (ref 5–15)
BUN: 9 mg/dL (ref 6–20)
CO2: 35 mmol/L — ABNORMAL HIGH (ref 22–32)
CREATININE: 1.19 mg/dL (ref 0.61–1.24)
Calcium: 8.9 mg/dL (ref 8.9–10.3)
Chloride: 79 mmol/L — ABNORMAL LOW (ref 101–111)
GFR calc Af Amer: >60 mL/min (ref >60)
GLUCOSE: 107 mg/dL — AB (ref 65–99)
Potassium: <2 mmol/L — CL (ref 3.5–5.1)
Sodium: 127 mmol/L — ABNORMAL LOW (ref 135–145)
Total Bilirubin: 1.2 mg/dL (ref 0.3–1.2)
Total Protein: 6.4 g/dL — ABNORMAL LOW (ref 6.5–8.1)

## 2016-09-28 LAB — CBC WITH DIFFERENTIAL/PLATELET
BASOS ABS: 0 10*3/uL (ref 0.0–0.1)
BASOS PCT: 0 %
EOS PCT: 0 %
Eosinophils Absolute: 0 10*3/uL (ref 0.0–0.7)
HEMATOCRIT: 34.3 % — AB (ref 39.0–52.0)
Hemoglobin: 12.6 g/dL — ABNORMAL LOW (ref 13.0–17.0)
Lymphocytes Relative: 17 %
Lymphs Abs: 0.9 10*3/uL (ref 0.7–4.0)
MCH: 34.8 pg — ABNORMAL HIGH (ref 26.0–34.0)
MCHC: 36.7 g/dL — ABNORMAL HIGH (ref 30.0–36.0)
MCV: 94.8 fL (ref 78.0–100.0)
MONO ABS: 0.3 10*3/uL (ref 0.1–1.0)
Monocytes Relative: 7 %
NEUTROS ABS: 3.8 10*3/uL (ref 1.7–7.7)
Neutrophils Relative %: 76 %
PLATELETS: 197 10*3/uL (ref 150–400)
RBC: 3.62 MIL/uL — AB (ref 4.22–5.81)
RDW: 13.3 % (ref 11.5–15.5)
WBC: 5.1 10*3/uL (ref 4.0–10.5)

## 2016-09-28 LAB — URINALYSIS, ROUTINE W REFLEX MICROSCOPIC
Bilirubin Urine: NEGATIVE
GLUCOSE, UA: NEGATIVE mg/dL
HGB URINE DIPSTICK: NEGATIVE
Ketones, ur: NEGATIVE mg/dL
Leukocytes, UA: NEGATIVE
Nitrite: NEGATIVE
PH: 6 (ref 5.0–8.0)
Protein, ur: NEGATIVE mg/dL
SPECIFIC GRAVITY, URINE: 1.004 — AB (ref 1.005–1.030)

## 2016-09-28 LAB — BRAIN NATRIURETIC PEPTIDE: B Natriuretic Peptide: 16.4 pg/mL (ref 0.0–100.0)

## 2016-09-28 LAB — I-STAT TROPONIN, ED: Troponin i, poc: 0.01 ng/mL (ref 0.00–0.08)

## 2016-09-28 LAB — MRSA PCR SCREENING: MRSA BY PCR: NEGATIVE

## 2016-09-28 LAB — TROPONIN I

## 2016-09-28 LAB — TSH: TSH: 0.703 u[IU]/mL (ref 0.350–4.500)

## 2016-09-28 LAB — D-DIMER, QUANTITATIVE: D-Dimer, Quant: 0.34 ug/mL-FEU (ref 0.00–0.50)

## 2016-09-28 IMAGING — DX DG THORACIC SPINE 2V
2 series · 2 of 2 positions shown · non-contrast
Comparison: [DATE] chest radiograph.

CLINICAL DATA: 56 y/o  M; shortness of breath.

EXAM:
THORACIC SPINE 2 VIEWS

[t-spine lat]
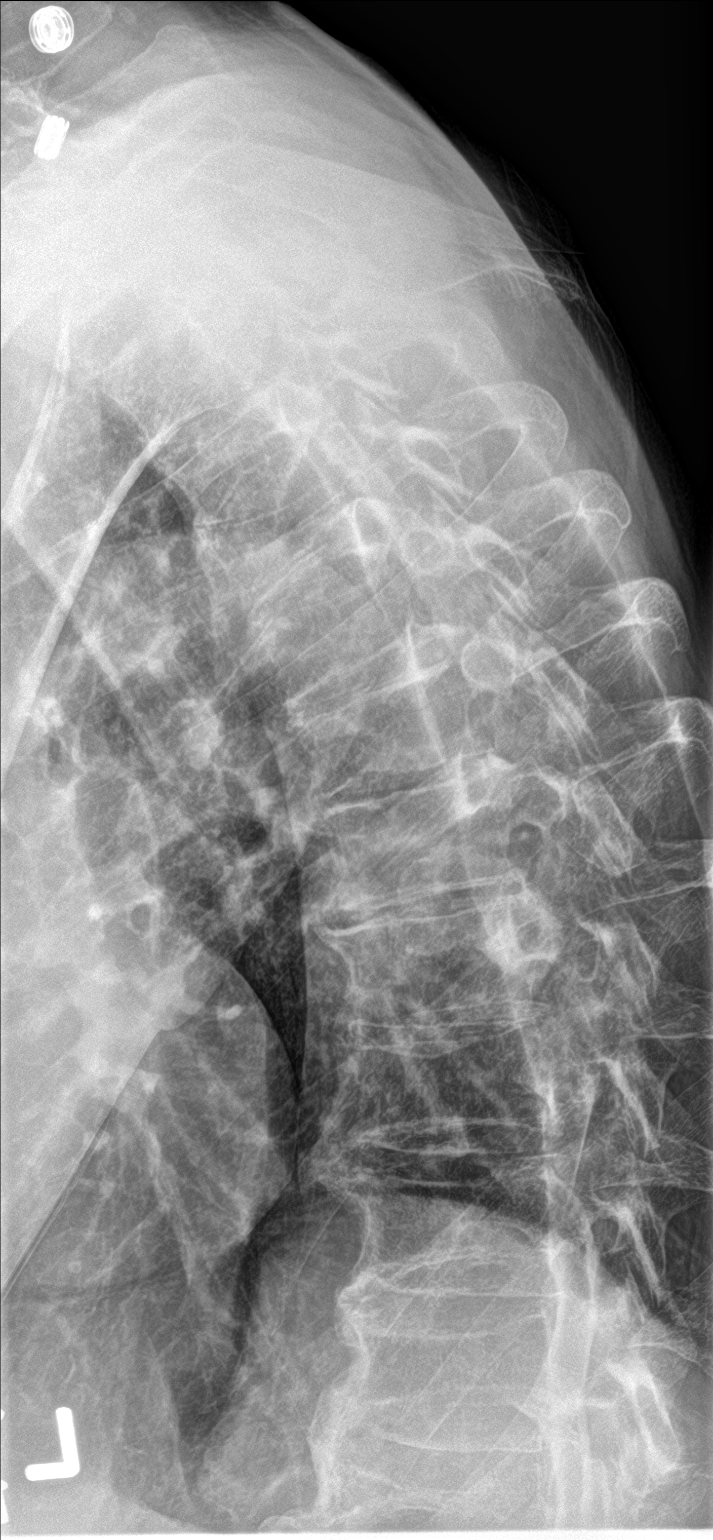

[t-spine ap]
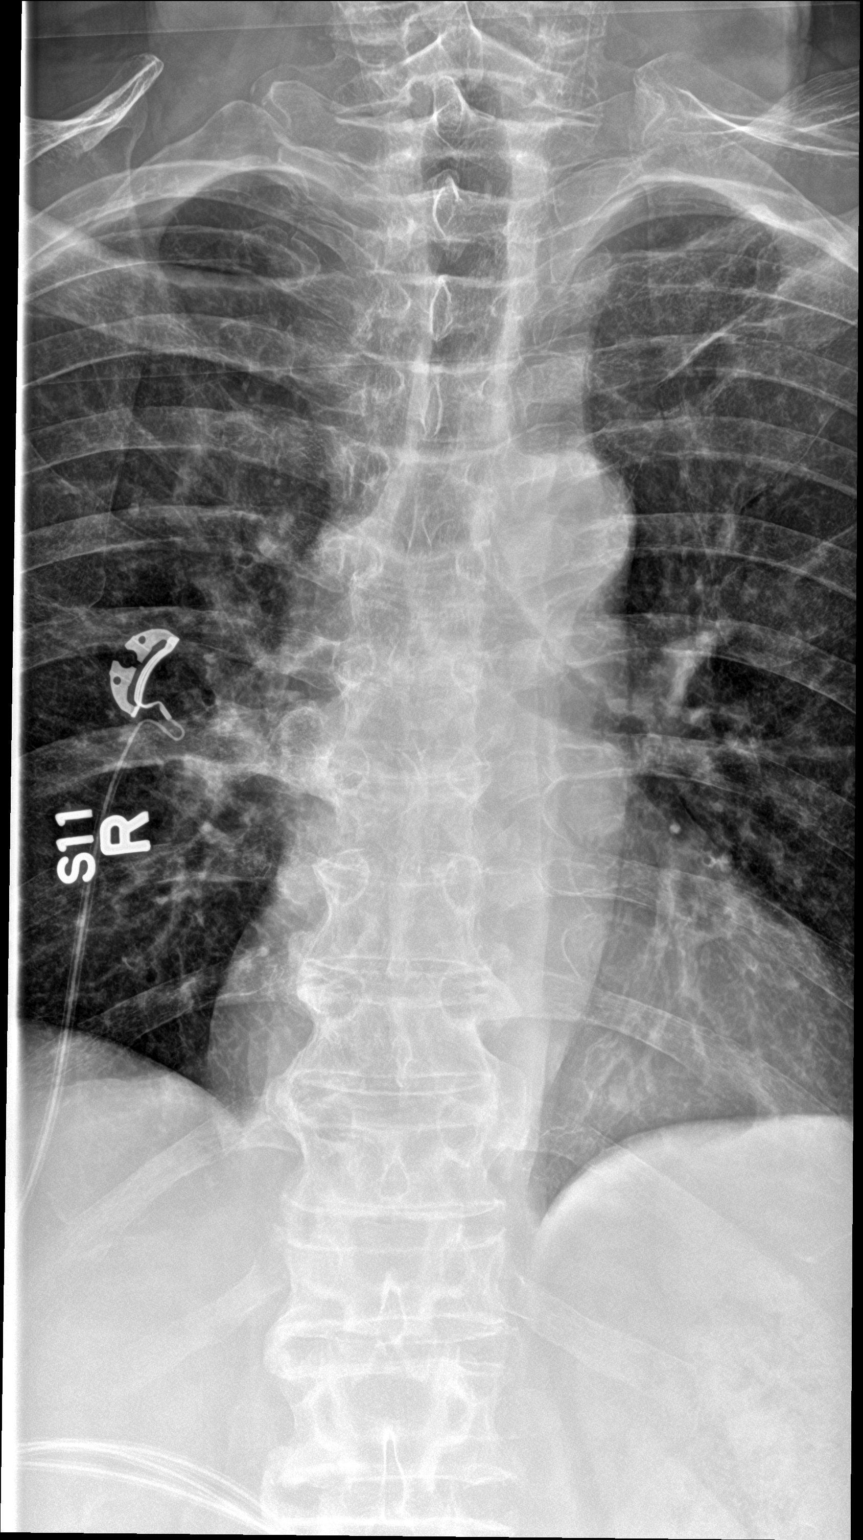

[2 of 2 positions shown; findings below may reference images not displayed]

FINDINGS: Slight loss of height of upper thoracic vertebral bodies is likely
degenerative. Normal thoracic kyphosis. No listhesis. Discogenic
degenerative changes are present with disc space narrowing, endplate
sclerosis, and marginal osteophytes.
IMPRESSION: Slight loss of height upper thoracic vertebral bodies is likely
degenerative. No acute fracture or malalignment identified.

By: VIDAEL M.D.

## 2016-09-28 IMAGING — CR DG CHEST 2V
2 series · 2 of 2 positions shown · non-contrast
Comparison: [DATE].

CLINICAL DATA: Shortness of breath .

EXAM:
CHEST  2 VIEW

[chest pa]
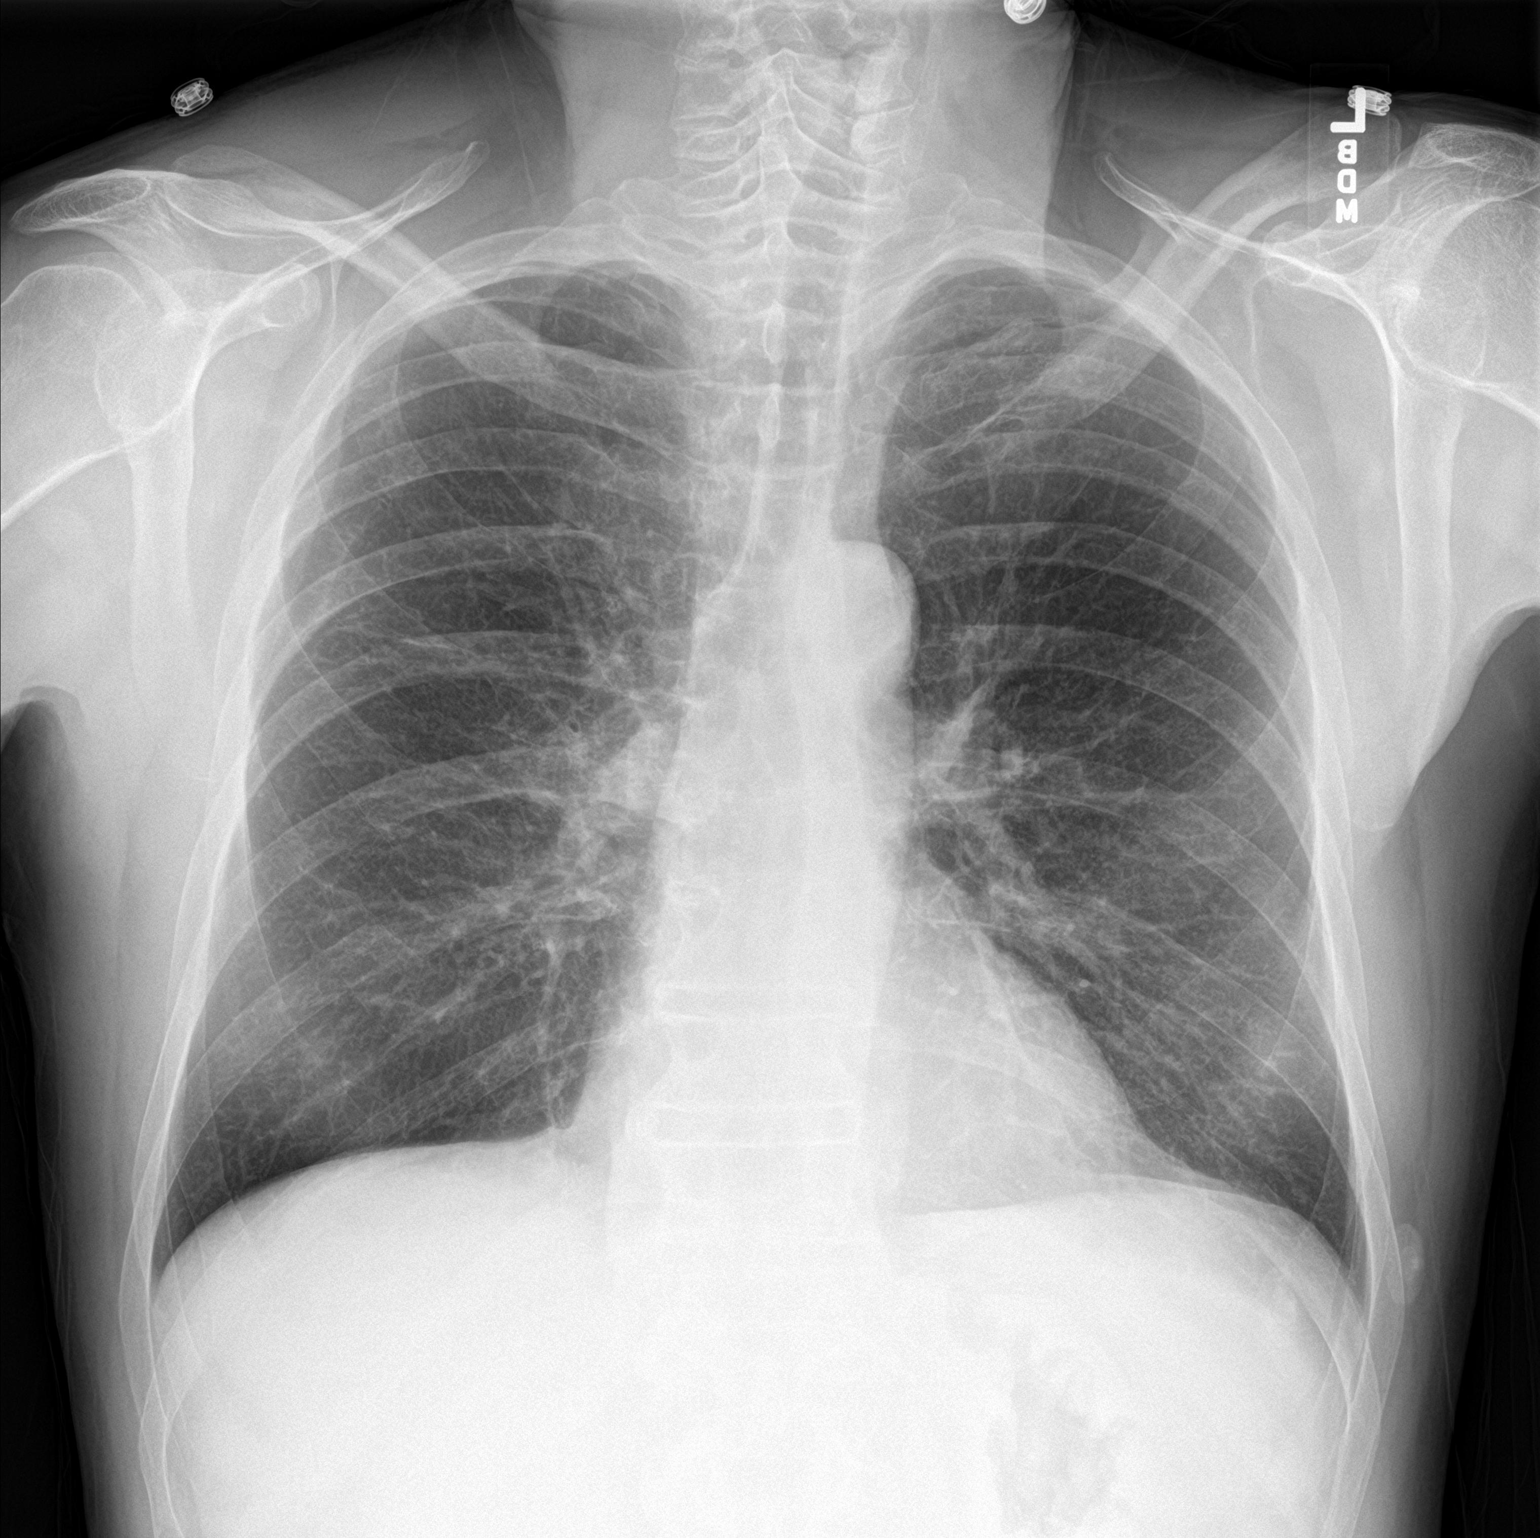

[chest lat]
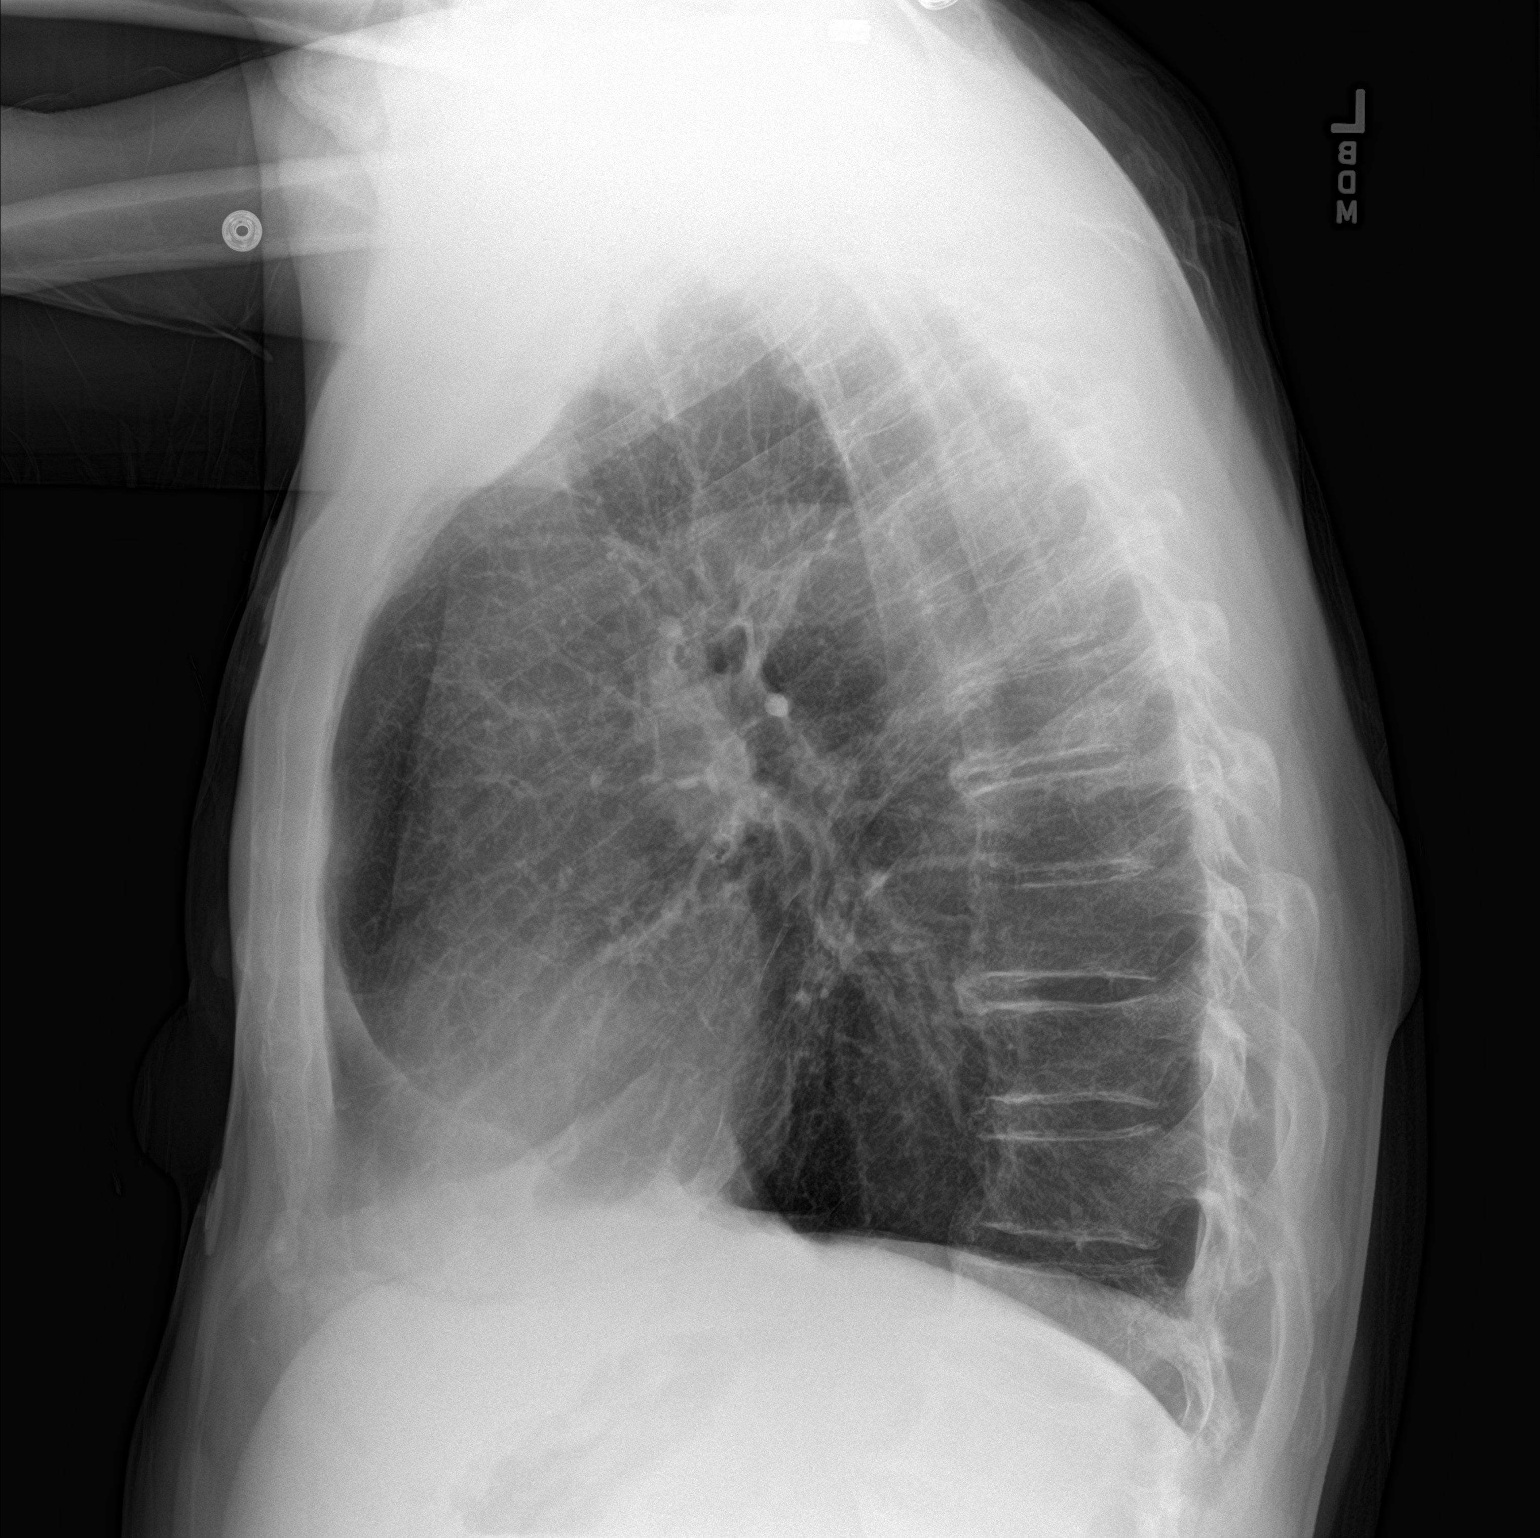

[2 of 2 positions shown; findings below may reference images not displayed]

FINDINGS: Mediastinum is normal. Heart size normal. Mild perihilar
interstitial prominence. Mild pneumonitis cannot be excluded. No
pleural effusion or pneumothorax. Diffuse thoracic spine osteopenia
and degenerative change. Mild upper thoracic vertebral body
compression fractures, age undetermined .
IMPRESSION: 1. Mild right perihilar interstitial prominence. Mild pneumonitis
cannot be excluded.

2. Diffuse thoracic spine osteopenia degenerative change. Mild upper
thoracic vertebral body compression fractures, age undetermined.

## 2016-09-28 MED ORDER — LISINOPRIL 40 MG PO TABS
40.0000 mg | ORAL_TABLET | Freq: Every day | ORAL | Status: DC
  Administered 2016-09-29 – 2016-09-30 (×2): 40 mg via ORAL
  Filled 2016-09-28: qty 1
  Filled 2016-09-28: qty 2

## 2016-09-28 MED ORDER — POTASSIUM CHLORIDE CRYS ER 20 MEQ PO TBCR
40.0000 meq | EXTENDED_RELEASE_TABLET | Freq: Three times a day (TID) | ORAL | Status: AC
  Administered 2016-09-28 (×2): 40 meq via ORAL
  Filled 2016-09-28 (×2): qty 2

## 2016-09-28 MED ORDER — MAGNESIUM SULFATE 2 GM/50ML IV SOLN: 2.0000 g | Freq: Once | INTRAVENOUS | Status: DC

## 2016-09-28 MED ORDER — SODIUM CHLORIDE 0.9 % IV SOLN
30.0000 meq | Freq: Once | INTRAVENOUS | Status: AC
  Administered 2016-09-28: 30 meq via INTRAVENOUS
  Filled 2016-09-28: qty 15

## 2016-09-28 MED ORDER — MAGNESIUM SULFATE 2 GM/50ML IV SOLN
2.0000 g | Freq: Once | INTRAVENOUS | Status: AC
  Administered 2016-09-28: 2 g via INTRAVENOUS
  Filled 2016-09-28: qty 50

## 2016-09-28 MED ORDER — LORAZEPAM 2 MG/ML IJ SOLN
1.0000 mg | Freq: Once | INTRAMUSCULAR | Status: AC
  Administered 2016-09-28: 1 mg via INTRAVENOUS
  Filled 2016-09-28: qty 1

## 2016-09-28 MED ORDER — POTASSIUM CHLORIDE 2 MEQ/ML IV SOLN
30.0000 meq | Freq: Once | INTRAVENOUS | Status: AC
  Administered 2016-09-28: 30 meq via INTRAVENOUS
  Filled 2016-09-28: qty 15

## 2016-09-28 MED ORDER — IPRATROPIUM-ALBUTEROL 0.5-2.5 (3) MG/3ML IN SOLN
3.0000 mL | Freq: Once | RESPIRATORY_TRACT | Status: AC
  Administered 2016-09-28: 3 mL via RESPIRATORY_TRACT
  Filled 2016-09-28: qty 3

## 2016-09-28 MED ORDER — ENOXAPARIN SODIUM 40 MG/0.4ML ~~LOC~~ SOLN
40.0000 mg | SUBCUTANEOUS | Status: DC
  Administered 2016-09-28 – 2016-09-29 (×2): 40 mg via SUBCUTANEOUS
  Filled 2016-09-28 (×2): qty 0.4

## 2016-09-28 MED ORDER — HYDRALAZINE HCL 20 MG/ML IJ SOLN: 10.0000 mg | Freq: Four times a day (QID) | INTRAMUSCULAR | Status: DC | PRN

## 2016-09-28 MED ORDER — POTASSIUM CHLORIDE CRYS ER 20 MEQ PO TBCR
40.0000 meq | EXTENDED_RELEASE_TABLET | Freq: Once | ORAL | Status: AC
  Administered 2016-09-28: 40 meq via ORAL
  Filled 2016-09-28: qty 2

## 2016-09-28 MED ORDER — LACTATED RINGERS IV BOLUS (SEPSIS)
1000.0000 mL | Freq: Once | INTRAVENOUS | Status: AC
  Administered 2016-09-28: 1000 mL via INTRAVENOUS

## 2016-09-28 MED ORDER — POTASSIUM CHLORIDE 2 MEQ/ML IV SOLN
INTRAVENOUS | Status: AC
Start: 2016-09-28 — End: 2016-09-29
  Administered 2016-09-28: 17:00:00 via INTRAVENOUS
  Filled 2016-09-28 (×2): qty 1000

## 2016-09-28 MED ORDER — SODIUM CHLORIDE 0.9 % IV SOLN
30.0000 meq | Freq: Once | INTRAVENOUS | Status: AC
  Administered 2016-09-29: 30 meq via INTRAVENOUS
  Filled 2016-09-28: qty 15

## 2016-09-28 MED ORDER — POTASSIUM CHLORIDE CRYS ER 20 MEQ PO TBCR
40.0000 meq | EXTENDED_RELEASE_TABLET | Freq: Three times a day (TID) | ORAL | Status: AC
  Administered 2016-09-29 (×2): 40 meq via ORAL
  Filled 2016-09-28: qty 2

## 2016-09-28 NOTE — Progress Notes (Signed)
Patient arrived to 4E27 via stretcher  From ED. Patient ambulated to bed with stand by assist. Patient denies any pain or discomfort at this time.

## 2016-09-28 NOTE — ED Provider Notes (Addendum)
[facility] DEPT Provider Note   CSN: [id number] Arrival date & time: 09/28/16  [id number]     History   Chief Complaint Chief Complaint  Patient presents with  . Shortness of Breath    HPI Dan Salazar is a 56 y.o. male.  HPI   56 yo M with h/o HTN who presents with Generalized weakness and shortness of breath. The patient states that he was recently started on 2 blood pressure medications. Since then, over the last month, he has felt generally fatigued" intermittently confused, and drowsy. He also feels like he is more short of breath than usual. He feels like he cannot get enough air. Denies any associated chest pain, however. The symptoms seem to be constant but do seem worse with exertion. Denies any chest pressure. No nausea or vomiting. He has had decreased appetite as well over the same time. He does not recall what these medications are. Otherwise, denies any recent fevers or chills. Denies any cough or sputum production.  Past Medical History:  Diagnosis Date  . Hypertension     Patient Active Problem List   Diagnosis Date Noted  . Hypokalemia 09/28/2016    Past Surgical History:  Procedure Laterality Date  . OPEN REDUCTION INTERNAL FIXATION (ORIF) DISTAL RADIAL FRACTURE Left 03/02/2013   Procedure: OPEN REDUCTION INTERNAL FIXATION (ORIF) LEFT DISTAL RADIAL FRACTURE;  Surgeon: Jolyn Nap, MD;  Location: [facility];  Service: Orthopedics;  Laterality: Left;       Home Medications    Prior to Admission medications   Medication Sig Start Date End Date Taking? Authorizing Provider  ibuprofen (ADVIL,MOTRIN) 200 MG tablet Take 600 mg by mouth every 6 (six) hours as needed for pain.    Historical Provider, MD  oxyCODONE-acetaminophen (PERCOCET/ROXICET) 5-325 MG per tablet Take 2 tablets by mouth every 4 (four) hours as needed for pain. 02/04/13   Leonard Schwartz, MD  oxyCODONE-acetaminophen (ROXICET) 5-325 MG per tablet Take 1-2 tablets by mouth every 4  (four) hours as needed for pain. 03/02/13   Milly Jakob, MD    Family History No family history on file.  Social History Social History  Substance Use Topics  . Smoking status: Never Smoker  . Smokeless tobacco: Never Used  . Alcohol use Yes     Comment: social     Allergies   Patient has no known allergies.   Review of Systems Review of Systems  Constitutional: Positive for fatigue. Negative for chills and fever.  HENT: Negative for congestion and rhinorrhea.   Eyes: Negative for visual disturbance.  Respiratory: Positive for shortness of breath. Negative for cough and wheezing.   Cardiovascular: Negative for chest pain and leg swelling.  Gastrointestinal: Negative for abdominal pain, diarrhea, nausea and vomiting.  Genitourinary: Negative for dysuria and flank pain.  Musculoskeletal: Negative for neck pain and neck stiffness.  Skin: Negative for rash and wound.  Allergic/Immunologic: Negative for immunocompromised state.  Neurological: Positive for weakness. Negative for syncope and headaches.  Psychiatric/Behavioral: Positive for confusion.  All other systems reviewed and are negative.    Physical Exam Updated Vital Signs BP 126/78   Pulse (!) 35   Temp 98.2 F (36.8 C) (Oral)   Resp 17   Ht 6\' 2"  (1.88 m)   Wt 190 lb (86.2 kg)   SpO2 98%   BMI 24.39 kg/m   Physical Exam  Constitutional: He is oriented to person, place, and time. He appears well-developed and well-nourished. No distress.  HENT:  Head:  Normocephalic and atraumatic.  Mildly dry MM  Eyes: Conjunctivae are normal.  Neck: Neck supple.  Cardiovascular: Normal rate, regular rhythm and normal heart sounds.  Exam reveals no friction rub.   No murmur heard. Pulmonary/Chest: Effort normal and breath sounds normal. No respiratory distress. He has no wheezes. He has no rales.  Abdominal: Soft. He exhibits no distension. There is no tenderness.  Musculoskeletal: He exhibits no edema.    Neurological: He is alert and oriented to person, place, and time. He has normal strength. No cranial nerve deficit or sensory deficit. He exhibits normal muscle tone. GCS eye subscore is 4. GCS verbal subscore is 5. GCS motor subscore is 6.  Mild, course tremor b/l UE  Skin: Skin is warm. Capillary refill takes less than 2 seconds.  Psychiatric: His mood appears anxious.  Nursing note and vitals reviewed.    ED Treatments / Results  Labs (all labs ordered are listed, but only abnormal results are displayed) Labs Reviewed  CBC WITH DIFFERENTIAL/PLATELET - Abnormal; Notable for the following:       Result Value   RBC 3.62 (*)    Hemoglobin 12.6 (*)    HCT 34.3 (*)    MCH 34.8 (*)    MCHC 36.7 (*)    All other components within normal limits  COMPREHENSIVE METABOLIC PANEL - Abnormal; Notable for the following:    Sodium 127 (*)    Potassium <2.0 (*)    Chloride 79 (*)    CO2 35 (*)    Glucose, Bld 107 (*)    Total Protein 6.4 (*)    AST 14 (*)    ALT 8 (*)    All other components within normal limits  URINALYSIS, ROUTINE W REFLEX MICROSCOPIC - Abnormal; Notable for the following:    Specific Gravity, Urine 1.004 (*)    All other components within normal limits  I-STAT VENOUS BLOOD GAS, ED - Abnormal; Notable for the following:    pH, Ven 7.515 (*)    pO2, Ven 26.0 (*)    Bicarbonate 39.6 (*)    Acid-Base Excess 15.0 (*)    All other components within normal limits  BRAIN NATRIURETIC PEPTIDE  D-DIMER, QUANTITATIVE (NOT AT [facility])  MAGNESIUM  I-STAT TROPOININ, ED    EKG  EKG Interpretation  Date/Time:  Friday September 28 2016 09:10:28 EST Ventricular Rate:  60 PR Interval:  156 QRS Duration: 122 QT Interval:  481 QTC Calculation: 481 R Axis:   31 Text Interpretation:  Sinus rhythm Atrial premature complexes Nonspecific intraventricular conduction delay Nonspecific T abnormalities, lateral leads Since last EKG pACs are new Otherwise no significant change Persistent TWI  in V6 No ST elevations Confirmed by Ellender Hose MD, Lysbeth Galas [phone]) on 09/28/2016 9:16:45 AM       Radiology Dg Chest 2 View  Result Date: 09/28/2016 CLINICAL DATA:  Shortness of breath . EXAM: CHEST  2 VIEW COMPARISON:  02/04/2013. FINDINGS: Mediastinum is normal. Heart size normal. Mild perihilar interstitial prominence. Mild pneumonitis cannot be excluded. No pleural effusion or pneumothorax. Diffuse thoracic spine osteopenia and degenerative change. Mild upper thoracic vertebral body compression fractures, age undetermined . IMPRESSION: 1. Mild right perihilar interstitial prominence. Mild pneumonitis cannot be excluded. 2. Diffuse thoracic spine osteopenia degenerative change. Mild upper thoracic vertebral body compression fractures, age undetermined. Electronically Signed   By: Marcello Moores  Register   On: 09/28/2016 09:27    Procedures .Critical Care Performed by: Duffy Bruce Authorized by: Duffy Bruce   Critical care provider statement:  Critical care time (minutes):  35   Critical care time was exclusive of:  Separately billable procedures and treating other patients   Critical care was necessary to treat or prevent imminent or life-threatening deterioration of the following conditions:  Circulatory failure, dehydration and metabolic crisis   Critical care was time spent personally by me on the following activities:  Blood draw for specimens, development of treatment plan with patient or surrogate, discussions with consultants, evaluation of patient's response to treatment, examination of patient, obtaining history from patient or surrogate, ordering and performing treatments and interventions, pulse oximetry, re-evaluation of patient's condition, ordering and review of laboratory studies and ordering and review of radiographic studies   I assumed direction of critical care for this patient from another provider in my specialty: no      (including critical care time)  Medications  Ordered in ED Medications  lactated ringers bolus 1,000 mL (1,000 mLs Intravenous New Bag/Given 09/28/16 1133)  potassium chloride SA (K-DUR,KLOR-CON) CR tablet 40 mEq (not administered)  potassium chloride 30 mEq in sodium chloride 0.9 % 265 mL (KCL MULTIRUN) IVPB (30 mEq Intravenous Given 09/28/16 1134)  magnesium sulfate IVPB 2 g 50 mL (not administered)  LORazepam (ATIVAN) injection 1 mg (not administered)  ipratropium-albuterol (DUONEB) 0.5-2.5 (3) MG/3ML nebulizer solution 3 mL (3 mLs Nebulization Given 09/28/16 0951)     Initial Impression / Assessment and Plan / ED Course  I have reviewed the triage vital signs and the nursing notes.  Pertinent labs & imaging results that were available during my care of the patient were reviewed by me and considered in my medical decision making (see chart for details).     56 year old male here with generalized fatigue, shortness of breath, and confusion. On arrival, vital signs are stable. He is mildly bradycardic. Labwork shows profound hyponatremia, hypokalemia, and metabolic alkalosis. He recently started a blood pressure medication, which I suspect is hydrochlorothiazide based on his description. Suspect his symptoms are secondary to adverse effect from diuretic. Will give the patient fluids, replete potassium, magnesium, and admit for close monitoring given hypokalemia, bradycardia, profound electrolyte disturbances.  Final Clinical Impressions(s) / ED Diagnoses   Final diagnoses:  Hypokalemia  Hyponatremia  Metabolic alkalosis    New Prescriptions New Prescriptions   No medications on file     Duffy Bruce, MD 09/28/16 1218    Duffy Bruce, MD 10/11/16 1217

## 2016-09-28 NOTE — ED Notes (Signed)
Patient transported to X-ray 

## 2016-09-28 NOTE — ED Triage Notes (Signed)
To ED by POV for eval of sob and 'just feeling not right' for a couple of weeks. Denies cp. No vomiting. Appears in nad.

## 2016-09-28 NOTE — Evaluation (Signed)
Physical Therapy Evaluation Patient Details Name: Dan Salazar MRN: [id number] DOB: 24-Dec-1960 Today's Date: 09/28/2016   History of Present Illness  Pt is a 56 y/o male admitted secondary to SOB and generalized weakness, found to be hyponatremic and hypokalemic. PMH includes HTN.  Clinical Impression  Pt presented supine in bed with HOB elevated, awake and willing to participate in therapy session. Prior to admission, pt reported that he was independent with all functional mobility and ADLs. He continues to work as well. Pt moving very well during evaluation and limited in ambulation distance only to being very tired. Pt stated that he did not sleep well and was just admitted on 4E floor from the ED approximately 15 minutes prior to this evaluation. All VSS throughout. PT will follow acutely to ensure a safe d/c home.     Follow Up Recommendations No PT follow up    Equipment Recommendations  None recommended by PT    Recommendations for Other Services       Precautions / Restrictions Restrictions Weight Bearing Restrictions: No      Mobility  Bed Mobility Overal bed mobility: Modified Independent                Transfers Overall transfer level: Needs assistance Equipment used: None Transfers: Sit to/from Stand Sit to Stand: Supervision         General transfer comment: supervision for safety, no LOB or instability with transition into standing from bed  Ambulation/Gait Ambulation/Gait assistance: Supervision Ambulation Distance (Feet): 25 Feet Assistive device: None Gait Pattern/deviations: Step-through pattern Gait velocity: decreased Gait velocity interpretation: Below normal speed for age/gender General Gait Details: no instability or LOB, supervision for safety and line management  Stairs            Wheelchair Mobility    Modified Rankin (Stroke Patients Only)       Balance Overall balance assessment: Needs assistance Sitting-balance support:  Feet supported Sitting balance-Leahy Scale: Normal     Standing balance support: During functional activity;No upper extremity supported Standing balance-Leahy Scale: Fair                               Pertinent Vitals/Pain Pain Assessment: No/denies pain    Home Living Family/patient expects to be discharged to:: Private residence Living Arrangements: Spouse/significant other Available Help at Discharge: Family;Available PRN/intermittently Type of Home: House Home Access: Stairs to enter Entrance Stairs-Rails: Right;Left;Can reach both Entrance Stairs-Number of Steps: 5 Home Layout: One level Home Equipment: None      Prior Function Level of Independence: Independent               Hand Dominance        Extremity/Trunk Assessment   Upper Extremity Assessment Upper Extremity Assessment: Overall WFL for tasks assessed    Lower Extremity Assessment Lower Extremity Assessment: Overall WFL for tasks assessed    Cervical / Trunk Assessment Cervical / Trunk Assessment: Normal  Communication   Communication: No difficulties  Cognition Arousal/Alertness: Awake/alert Behavior During Therapy: WFL for tasks assessed/performed Overall Cognitive Status: Within Functional Limits for tasks assessed                      General Comments      Exercises     Assessment/Plan    PT Assessment Patient needs continued PT services  PT Problem List Decreased balance;Decreased mobility;Decreased coordination;Cardiopulmonary status limiting activity  PT Treatment Interventions DME instruction;Gait training;Stair training;Functional mobility training;Therapeutic activities;Therapeutic exercise;Balance training;Neuromuscular re-education;Patient/family education    PT Goals (Current goals can be found in the Care Plan section)  Acute Rehab PT Goals Patient Stated Goal: return home tomorrow PT Goal Formulation: With patient Time For Goal  Achievement: 10/12/16 Potential to Achieve Goals: Good    Frequency Min 3X/week   Barriers to discharge        Co-evaluation               End of Session   Activity Tolerance: Patient tolerated treatment well Patient left: in bed;with call bell/phone within reach Nurse Communication: Mobility status PT Visit Diagnosis: Other abnormalities of gait and mobility (R26.89)         Time: [id number] PT Time Calculation (min) (ACUTE ONLY): 14 min   Charges:   PT Evaluation $PT Eval Low Complexity: 1 Procedure     PT G CodesClearnce Sorrel Oletta Buehring 09/28/2016, 4:47 PM Sherie Don, [place], DPT [phone]

## 2016-09-28 NOTE — ED Notes (Signed)
ED Provider at bedside. 
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
## 2016-09-29 ENCOUNTER — Inpatient Hospital Stay (HOSPITAL_COMMUNITY): Payer: Self-pay

## 2016-09-29 DIAGNOSIS — R0600 Dyspnea, unspecified: Secondary | ICD-10-CM

## 2016-09-29 LAB — ECHOCARDIOGRAM COMPLETE
CHL CUP DOP CALC LVOT VTI: 20.1 cm
CHL CUP MV DEC (S): 257
CHL CUP RV SYS PRESS: 29 mmHg
CHL CUP TV REG PEAK VELOCITY: 256 cm/s
E/e' ratio: 7.27
EWDT: 257 ms
FS: 35 % (ref 28–44)
Height: 74 in
IV/PV OW: 0.96
LA ID, A-P, ES: 38 mm
LA diam index: 1.78 cm/m2
LA vol A4C: 43.4 ml
LA vol index: 23.8 mL/m2
LA vol: 50.8 mL
LDCA: 3.8 cm2
LEFT ATRIUM END SYS DIAM: 38 mm
LV E/e' medial: 7.27
LV E/e'average: 7.27
LV PW d: 10.2 mm — AB (ref 0.6–1.1)
LV TDI E'MEDIAL: 6.02
LV e' LATERAL: 8.33 cm/s
LVOT SV: 76 mL
LVOT peak grad rest: 5 mmHg
LVOTD: 22 mm
LVOTPV: 117 cm/s
Lateral S' vel: 13.6 cm/s
MV pk A vel: 55.1 m/s
MV pk E vel: 60.6 m/s
TAPSE: 20.4 mm
TDI e' lateral: 8.33
TR max vel: 256 cm/s
Weight: 3040 oz

## 2016-09-29 LAB — HIV ANTIBODY (ROUTINE TESTING W REFLEX): HIV Screen 4th Generation wRfx: NONREACTIVE

## 2016-09-29 LAB — BASIC METABOLIC PANEL
Anion gap: 11 (ref 5–15)
Anion gap: 7 (ref 5–15)
BUN: 10 mg/dL (ref 6–20)
BUN: 11 mg/dL (ref 6–20)
CALCIUM: 8.8 mg/dL — AB (ref 8.9–10.3)
CHLORIDE: 92 mmol/L — AB (ref 101–111)
CHLORIDE: 97 mmol/L — AB (ref 101–111)
CO2: 31 mmol/L (ref 22–32)
CO2: 31 mmol/L (ref 22–32)
CREATININE: 1.2 mg/dL (ref 0.61–1.24)
CREATININE: 1.28 mg/dL — AB (ref 0.61–1.24)
Calcium: 8.9 mg/dL (ref 8.9–10.3)
Glucose, Bld: 101 mg/dL — ABNORMAL HIGH (ref 65–99)
Glucose, Bld: 135 mg/dL — ABNORMAL HIGH (ref 65–99)
POTASSIUM: 3.4 mmol/L — AB (ref 3.5–5.1)
Potassium: 3.5 mmol/L (ref 3.5–5.1)
SODIUM: 134 mmol/L — AB (ref 135–145)
SODIUM: 135 mmol/L (ref 135–145)

## 2016-09-29 LAB — CBC
HCT: 34.3 % — ABNORMAL LOW (ref 39.0–52.0)
HEMOGLOBIN: 12 g/dL — AB (ref 13.0–17.0)
MCH: 33.8 pg (ref 26.0–34.0)
MCHC: 35 g/dL (ref 30.0–36.0)
MCV: 96.6 fL (ref 78.0–100.0)
Platelets: 192 10*3/uL (ref 150–400)
RBC: 3.55 MIL/uL — AB (ref 4.22–5.81)
RDW: 13 % (ref 11.5–15.5)
WBC: 3.9 10*3/uL — ABNORMAL LOW (ref 4.0–10.5)

## 2016-09-29 LAB — HEMOGLOBIN A1C
HEMOGLOBIN A1C: 5.6 % (ref 4.8–5.6)
MEAN PLASMA GLUCOSE: 114 mg/dL

## 2016-09-29 MED ORDER — POTASSIUM CHLORIDE CRYS ER 20 MEQ PO TBCR
40.0000 meq | EXTENDED_RELEASE_TABLET | Freq: Once | ORAL | Status: AC
  Administered 2016-09-29: 40 meq via ORAL
  Filled 2016-09-29: qty 2

## 2016-09-29 MED ORDER — SODIUM CHLORIDE 0.45 % IV SOLN
INTRAVENOUS | Status: AC
  Administered 2016-09-29: 09:00:00 via INTRAVENOUS
  Filled 2016-09-29: qty 1000

## 2016-09-29 MED ORDER — SODIUM CHLORIDE 0.9 % IV SOLN
INTRAVENOUS | Status: DC
  Administered 2016-09-29: 06:00:00 via INTRAVENOUS
  Filled 2016-09-29: qty 1000

## 2016-09-29 NOTE — Progress Notes (Signed)
Family Medicine Teaching Service Daily Progress Note Intern Pager: [phone]  Patient name: Dan Salazar Medical record number: [id number] Date of birth: 09-19-1960 Age: 56 y.o. Gender: male  Primary Care Provider: No PCP Per Patient Consultants: None Code Status: Full  Pt Overview and Major Events to Date:    Assessment and Plan: Dan Salazar is a 56 y.o. male presenting with shortness of breath and generalized weakness, found to have significant hyponatremia and hypokalemia. PMH is significant for Hypertension.  # Metabolic Alkalosis with Multiple Electrolyte Abnormalities including Hyponatremia, Hypokalemia, and Hypochloremia, Resolved: Suspected secondary to medication effect. Started 2 new BP medications one month prior to presentation. Contacted [facility] on [address] and they confirm that he was taking Lisinopril 40mg  and HCTZ 25mg . VBG with pH 7.515, pCO2 49.2, pO2 26, Bicarb 39.6. CMP with Sodium 127, Potassium less than 2, Chloride 79. Magnesium level not checked. BNP, Troponin, and D Dimer all normal at 16.4, 0.01, and 0.34 respectively. CXR with mild right perihilar interstitial prominence, mild pneumonitis cannot be excluded. EKG with premature atrial complexes, sinus rhythm. In ED, was given Duoneb, 1L LR, Magnesium 2g, KCl 51mEq IV, and Kdur 71mEq. Electrolytes stable this morning. - transfer out of stepdown - Renal Function Panel daily - will give one more dose of Kdur today - TSH wnl - Increased risk of Rhabdomyolysis with severe hypokalemia. Monitor closely.  - PT/OT - no needs  - Does not have PCP. Will need Hospital Follow up.  #AKI. Unknown baseline. Patient admitted with Cr of 1.19, today 1.28. -trending up -repeat tomorrow -PO hydration -avoid nephrotoxic medications  #Shortness of breath, resolved - per patient, this is the reason he came to the ED. Longstanding, months in duration, not acutely worsened recently. XR without obvious cause. Vitals normal. No  edema, cough, fevers, etc to further guide differential. Nonsmoker. EKG with PACs, no significant ST changes though no prior for comparison. BNP normal. Trop neg.  - echo pending - recommend outpatient PFTs after discharge  # Hypertension: Home medications include Lisinopril 40mg  and HCTZ 25mg . Normotensive currently  - Hold home HCTZ. This will likely need to be held at discharge - Continue home Lisinopril - Monitor BP  # Anemia: Mild anemia with hemoglobin 12.6, normal MVC at 94.8, noted on admission. - Continue to monitor on CBC  # Hyperglycemia: to 107 noted on admission. Increased risk of diabetes with history of hypertension. - A1c 5.6  # Possible Osteopenia: CXR showed diffuse thoracic spine osteopenia with mild upper thoracic vertebral body compression fractures, age undetermined. - Consider further workup as outpatient.   FEN/GI: Heart Healthy, SLIV Prophylaxis: Lovenox  Disposition: Discharge home soon  Subjective:  Doing well this morning. No concerns. Patient wanting to be discharged. Denies any symptoms including no dyspnea.  Objective: Temp:  [97.4 F (36.3 C)-98.2 F (36.8 C)] 97.4 F (36.3 C) (03/03 0320) Pulse Rate:  [34-147] 66 (03/03 0700) Resp:  [11-21] 18 (03/03 0700) BP: (95-145)/(59-120) 128/87 (03/03 0700) SpO2:  [88 %-100 %] 92 % (03/03 0320) Physical Exam: General: 56yo male sitting in bed, resting comfortably, NAD Cardiovascular: RRR. No murmurs. Respiratory: Clear to auscultation bilaterally, no wheezing Gastrointestinal: Soft and nondistended, bowel sounds normal, nontender MSK: Muscle strength 5/5 in upper and lower extremities Neuro: No focal deficits. Sensation intact.   Laboratory:  Recent Labs Lab 09/28/16 0939 09/29/16 0513  WBC 5.1 3.9*  HGB 12.6* 12.0*  HCT 34.3* 34.3*  PLT 197 192    Recent Labs Lab 09/28/16 0939  09/28/16  2013 09/28/16 2235 09/29/16 0513  NA 127*  < > 128* 131* 134*  K <2.0*  < > 2.2* 2.6* 3.4*   CL 79*  < > 80* 85* 92*  CO2 35*  < > 36* 37* 31  BUN 9  < > 11 11 10   CREATININE 1.19  < > 1.21 1.27* 1.20  CALCIUM 8.9  < > 8.7* 8.7* 8.9  PROT 6.4*  --   --   --   --   BILITOT 1.2  --   --   --   --   ALKPHOS 58  --   --   --   --   ALT 8*  --   --   --   --   AST 14*  --   --   --   --   GLUCOSE 107*  < > 109* 111* 101*  < > = values in this interval not displayed.  Imaging/Diagnostic Tests: Dg Chest 2 View  Result Date: 09/28/2016 CLINICAL DATA:  Shortness of breath . EXAM: CHEST  2 VIEW COMPARISON:  02/04/2013. FINDINGS: Mediastinum is normal. Heart size normal. Mild perihilar interstitial prominence. Mild pneumonitis cannot be excluded. No pleural effusion or pneumothorax. Diffuse thoracic spine osteopenia and degenerative change. Mild upper thoracic vertebral body compression fractures, age undetermined . IMPRESSION: 1. Mild right perihilar interstitial prominence. Mild pneumonitis cannot be excluded. 2. Diffuse thoracic spine osteopenia degenerative change. Mild upper thoracic vertebral body compression fractures, age undetermined. Electronically Signed   By: Marcello Moores  Register   On: 09/28/2016 09:27   Dg Thoracic Spine 2 View  Result Date: 09/28/2016 CLINICAL DATA:  56 y/o  M; shortness of breath. EXAM: THORACIC SPINE 2 VIEWS COMPARISON:  09/28/2016 chest radiograph. FINDINGS: Slight loss of height of upper thoracic vertebral bodies is likely degenerative. Normal thoracic kyphosis. No listhesis. Discogenic degenerative changes are present with disc space narrowing, endplate sclerosis, and marginal osteophytes. IMPRESSION: Slight loss of height upper thoracic vertebral bodies is likely degenerative. No acute fracture or malalignment identified. Electronically Signed   By: Kristine Garbe M.D.   On: 09/28/2016 14:34    Katheren Shams, DO 09/29/2016, 8:32 AM PGY-3, [place] Intern pager: [phone], text pages welcome

## 2016-09-29 NOTE — Discharge Summary (Addendum)
[facility] Discharge Summary  Patient name: Dan Salazar Medical record number: [id number] Date of birth: 1961-04-22 Age: 56 y.o. Gender: male Date of Admission: 09/28/2016  Date of Discharge:  Admitting Physician: Dan Resides, MD  Primary Care Provider: No PCP Per Patient Consultants: None  Indication for Hospitalization: Electrolyte abnormalities  Discharge Diagnoses/Problem List:   Patient Active Problem List   Diagnosis Date Noted  . Hypokalemia 09/28/2016  . Hyponatremia   . Metabolic alkalosis   . Essential hypertension    Disposition: Home  Discharge Condition: Improved  Discharge Exam:  BP 131/82 (BP Location: Left Arm)   Pulse 61   Temp 98.2 F (36.8 C) (Oral)   Resp 19   Ht 6\' 2"  (1.88 m)   Wt 190 lb (86.2 kg)   SpO2 100%   BMI 24.39 kg/m   General: resting comfortably, NAD Cardiovascular: RRR. No murmurs. Respiratory: Clear to auscultation bilaterally, no wheezing Gastrointestinal: Soft and nondistended, bowel sounds normal, nontender MSK: Muscle strength 5/5 in upper and lower extremities Neuro: No focal deficits.     Brief Hospital Course:  Dan Salazar is a 56 y.o. male who presented with shortness of breath, found to have fairly profound electrolyte abnormalities. PMH is significant for hypertension.  Electrolyte abnormalities on admission included low K, low Na, low Cl with a metabolic alkalosis (see labs below). Most likely etiology was hydrochlorothiazide use. Electrolytes were replenished and patient returned to baseline soon after discontinuation of medicine. Noted to also have AKI which started down trending at time of discharge (baseline overall unknown). Made HCTZ an intolerance for patient to avoid this adverse effect in future. For his hypertension he was just continued on lisinopril. Blood pressures were wnl on just this medication. All symptoms had resolved by time of discharge.   Issues for Follow Up:  1.  Monitor BP on new medications 2. Recheck electrolytes and renal function  Significant Procedures: None  Significant Labs and Imaging:   Recent Labs Lab 09/28/16 0939 09/29/16 0513  WBC 5.1 3.9*  HGB 12.6* 12.0*  HCT 34.3* 34.3*  PLT 197 192    Recent Labs Lab 09/28/16 0939 09/28/16 1129 09/28/16 1542 09/28/16 2013 09/28/16 2235 09/29/16 0513 09/29/16 0939  NA 127*  --  127* 128* 131* 134* 135  K <2.0*  --  2.1* 2.2* 2.6* 3.4* 3.5  CL 79*  --  80* 80* 85* 92* 97*  CO2 35*  --  36* 36* 37* 31 31  GLUCOSE 107*  --  111* 109* 111* 101* 135*  BUN 9  --  9 11 11 10 11   CREATININE 1.19  --  1.11 1.21 1.27* 1.20 1.28*  CALCIUM 8.9  --  8.8* 8.7* 8.7* 8.9 8.8*  MG  --  1.8  --   --   --   --   --   PHOS  --   --  3.2 3.9 3.3  --   --   ALKPHOS 58  --   --   --   --   --   --   AST 14*  --   --   --   --   --   --   ALT 8*  --   --   --   --   --   --   ALBUMIN 3.5  --  3.4* 3.3* 3.1*  --   --    VBG 7.5/49.09/24/37.6 Trop neg TSH 0.7 Hbg A1c 5.6 HIV neg  Echo - normal EF; G1DD  Dg Chest 2 View  Result Date: 09/28/2016 IMPRESSION: 1. Mild right perihilar interstitial prominence. Mild pneumonitis cannot be excluded. 2. Diffuse thoracic spine osteopenia degenerative change. Mild upper thoracic vertebral body compression fractures, age undetermined. Electronically Signed   By: Marcello Moores  Register   On: 09/28/2016 09:27   Dg Thoracic Spine 2 View  Result Date: 09/28/2016 IMPRESSION: Slight loss of height upper thoracic vertebral bodies is likely degenerative. No acute fracture or malalignment identified. Electronically Signed   By: Kristine Garbe M.D.   On: 09/28/2016 14:34    Results/Tests Pending at Time of Discharge: None  Discharge Medications:  Allergies as of 09/30/2016      Reactions   Hctz [hydrochlorothiazide] Other (See Comments)   Severe electrolyte abnormalities      Medication List    STOP taking these medications   hydrochlorothiazide 25 MG  tablet Commonly known as:  HYDRODIURIL   oxyCODONE-acetaminophen 5-325 MG tablet Commonly known as:  ROXICET     TAKE these medications   lisinopril 40 MG tablet Commonly known as:  PRINIVIL,ZESTRIL Take 40 mg by mouth daily.       Discharge Instructions: Please refer to Patient Instructions section of EMR for full details.  Patient was counseled important signs and symptoms that should prompt return to medical care, changes in medications, dietary instructions, activity restrictions, and follow up appointments.   Follow-Up Appointments:   Follow-up Information    [facility]. Schedule an appointment as soon as possible for a visit.   Why:  For hospital follow-up Contact information: [place] [address] [phone]           Yasmeen Manka Y Hayze Gazda, DO 09/29/2016, 8:42 PM PGY-3, [place]

## 2016-09-29 NOTE — Progress Notes (Signed)
  Echocardiogram 2D Echocardiogram has been performed.  Johny Chess 09/29/2016, 3:33 PM

## 2016-09-29 NOTE — Progress Notes (Signed)
Report called to Hulan Amato, RN on 6N.

## 2016-09-30 LAB — RENAL FUNCTION PANEL
Albumin: 3.1 g/dL — ABNORMAL LOW (ref 3.5–5.0)
Anion gap: 6 (ref 5–15)
BUN: 14 mg/dL (ref 6–20)
CO2: 31 mmol/L (ref 22–32)
CREATININE: 1.27 mg/dL — AB (ref 0.61–1.24)
Calcium: 8.8 mg/dL — ABNORMAL LOW (ref 8.9–10.3)
Chloride: 96 mmol/L — ABNORMAL LOW (ref 101–111)
GFR calc Af Amer: >60 mL/min (ref >60)
GLUCOSE: 98 mg/dL (ref 65–99)
POTASSIUM: 3.3 mmol/L — AB (ref 3.5–5.1)
Phosphorus: 3.3 mg/dL (ref 2.5–4.6)
SODIUM: 133 mmol/L — AB (ref 135–145)

## 2016-09-30 MED ORDER — POTASSIUM CHLORIDE CRYS ER 20 MEQ PO TBCR
80.0000 meq | EXTENDED_RELEASE_TABLET | Freq: Once | ORAL | Status: AC
  Administered 2016-09-30: 80 meq via ORAL
  Filled 2016-09-30: qty 4

## 2016-09-30 NOTE — Discharge Instructions (Signed)
STOP TAKING YOUR HCTZ. Please dispense of medication appropriately.  Follow-up with PCP.  You were admitted to hospital for electrolyte abnormalities. They have since resolved.

## 2016-09-30 NOTE — Evaluation (Addendum)
Occupational Therapy Evaluation Patient Details Name: Dan Salazar MRN: [id number] DOB: 08-22-1960 Today's Date: 09/30/2016    History of Present Illness Pt is Dan 57 y/o male admitted secondary to SOB and generalized weakness, found to be hyponatremic and hypokalemic. PMH includes HTN.   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 Pt currently requires supervision for safety with standing ADL and increased time for processing with higher level ADL/IADL tasks. Vital signs stable throughout session with SpO2 ranging from 94-97%. He would benefit from continued OT services while admitted to improve independence with ADL and functional mobility prior to returning home with intermittent supervision from family. Will continue to follow acutely.    Follow Up Recommendations  No OT follow up;Supervision - Intermittent    Equipment Recommendations  None recommended by OT    Recommendations for Other Services       Precautions / Restrictions Precautions Precautions: Fall Restrictions Weight Bearing Restrictions: No      Mobility Bed Mobility               General bed mobility comments: OOB in chair on arrival  Transfers Overall transfer level: Needs assistance Equipment used: None Transfers: Sit to/from Stand Sit to Stand: Supervision         General transfer comment: Supervision for safety.    Balance Overall balance assessment: Needs assistance Sitting-balance support: Feet supported Sitting balance-Leahy Scale: Normal     Standing balance support: During functional activity;No upper extremity supported Standing balance-Leahy Scale: Good Standing balance comment: Supervision for safety with standing tasks.                            ADL Overall ADL's : Needs assistance/impaired                                       General ADL Comments: Supervision for safety with  standing ADL. Otherwise modified independence (increased time).     Vision Patient Visual Report: No change from baseline Vision Assessment?: No apparent visual deficits     Perception     Praxis      Pertinent Vitals/Pain Pain Assessment: No/denies pain     Hand Dominance Right   Extremity/Trunk Assessment Upper Extremity Assessment Upper Extremity Assessment: Overall WFL for tasks assessed   Lower Extremity Assessment Lower Extremity Assessment: Overall WFL for tasks assessed   Cervical / Trunk Assessment Cervical / Trunk Assessment: Normal   Communication Communication Communication: No difficulties   Cognition Arousal/Alertness: Awake/alert Behavior During Therapy: WFL for tasks assessed/performed Overall Cognitive Status: No family/caregiver present to determine baseline cognitive functioning                 General Comments: Per RN, pt likely at baseline cognitively. Increased processing time and required cues for problem solving.   General Comments       Exercises       Shoulder Instructions      Home Living Family/patient expects to be discharged to:: Private residence Living Arrangements: Spouse/significant other Available Help at Discharge: Family;Available PRN/intermittently Type of Home: House Home Access: Stairs to enter [employer] of Steps: 5 Entrance Stairs-Rails: Right;Left;Can reach both Home Layout: One level     Bathroom Shower/Tub: Tub/shower unit Shower/tub characteristics: Curtain Biochemist, clinical: Handicapped height (comfort height)     Home  Equipment: Grab bars - tub/shower          Prior Functioning/Environment Level of Independence: Independent                 OT Problem List: Decreased activity tolerance;Impaired balance (sitting and/or standing);Decreased safety awareness;Decreased cognition;Decreased knowledge of use of DME or AE      OT Treatment/Interventions: Self-care/ADL  training;Therapeutic exercise;Energy conservation;DME and/or AE instruction;Therapeutic activities;Patient/family education;Cognitive remediation/compensation;Balance training    OT Goals(Current goals can be found in the care plan section) Acute Rehab OT Goals Patient Stated Goal: go home today OT Goal Formulation: With patient Time For Goal Achievement: 10/14/16 Potential to Achieve Goals: Good ADL Goals Pt Will Transfer to Toilet: with modified independence;ambulating Pt Will Perform Toileting - Clothing Manipulation and hygiene: with modified independence;sit to/from stand Pt Will Perform Tub/Shower Transfer: with modified independence;Tub transfer;ambulating Additional ADL Goal #1: Pt will complete medication management task with no VC's for problem solving in Dan moderately distracting environment.  OT Frequency: Min 1X/week   Barriers to D/C:            Co-evaluation              End of Session Nurse Communication: Mobility status  Activity Tolerance: Patient tolerated treatment well Patient left: in chair;with call bell/phone within reach  OT Visit Diagnosis: Unsteadiness on feet (R26.81)                ADL either performed or assessed with clinical judgement  Time: 1025-1045 OT Time Calculation (min): 20 min Charges:  OT General Charges $OT Visit: 1 Procedure OT Evaluation $OT Eval Moderate Complexity: 1 Procedure G-Codes:     Dan Herrlich, MS OTR/L  Pager: [place] Dan Salazar 09/30/2016, 12:32 PM

## 2016-09-30 NOTE — Progress Notes (Signed)
Pt ready to go home.  DC instructions given and reviewed with pt.  Follow up reviewed.  Instructed pt on potassium rich foods at home.

## 2017-04-24 ENCOUNTER — Ambulatory Visit: Payer: Self-pay | Admitting: General Surgery

## 2017-04-24 NOTE — Patient Instructions (Addendum)
Dan Salazar  04/24/2017   Your procedure is scheduled on: 04/30/17  Report to [facility] Main  Entrance Take [place]  elevators to 3rd floor to  [place] at    0830 AM.    Call this number if you have problems the morning of surgery [phone]    Remember: ONLY 1 PERSON MAY GO WITH YOU TO SHORT STAY TO GET  READY MORNING OF [place].  Do not eat food or drink liquids :After Midnight.     Take these medicines the morning of surgery with A SIP OF WATER: NONE                                You may not have any metal on your body including hair pins and              piercings  Do not wear jewelry,, lotions, powders or perfumes, deodorant                       Men may shave face and neck.   Do not bring valuables to the hospital. [place].  Contacts, dentures or bridgework may not be worn into surgery.     Patients discharged the day of surgery will not be allowed to drive home.  Name and phone number of your driver:  Special Instructions: N/A              Please read over the following fact sheets you were given: _____________________________________________________________________             [facility] - Preparing for Surgery Before surgery, you can play an important role.  Because skin is not sterile, your skin needs to be as free of germs as possible.  You can reduce the number of germs on your skin by washing with CHG (chlorahexidine gluconate) soap before surgery.  CHG is an antiseptic cleaner which kills germs and bonds with the skin to continue killing germs even after washing. Please DO NOT use if you have an allergy to CHG or antibacterial soaps.  If your skin becomes reddened/irritated stop using the CHG and inform your nurse when you arrive at Short Stay. Do not shave (including legs and underarms) for at least 48 hours prior to the first CHG shower.  You may shave your  face/neck. Please follow these instructions carefully:  1.  Shower with CHG Soap the night before surgery and the  morning of Surgery.  2.  If you choose to wash your hair, wash your hair first as usual with your  normal  shampoo.  3.  After you shampoo, rinse your hair and body thoroughly to remove the  shampoo.                           4.  Use CHG as you would any other liquid soap.  You can apply chg directly  to the skin and wash                       Gently with a scrungie or clean washcloth.  5.  Apply the CHG Soap to your body ONLY  FROM THE NECK DOWN.   Do not use on face/ open                           Wound or open sores. Avoid contact with eyes, ears mouth and genitals (private parts).                       Wash face,  Genitals (private parts) with your normal soap.             6.  Wash thoroughly, paying special attention to the area where your surgery  will be performed.  7.  Thoroughly rinse your body with warm water from the neck down.  8.  DO NOT shower/wash with your normal soap after using and rinsing off  the CHG Soap.                9.  Pat yourself dry with a clean towel.            10.  Wear clean pajamas.            11.  Place clean sheets on your bed the night of your first shower and do not  sleep with pets. Day of Surgery : Do not apply any lotions/deodorants the morning of surgery.  Please wear clean clothes to the hospital/surgery center.  FAILURE TO FOLLOW THESE INSTRUCTIONS MAY RESULT IN THE CANCELLATION OF YOUR SURGERY PATIENT SIGNATURE_________________________________  NURSE SIGNATURE__________________________________  ________________________________________________________________________

## 2017-04-24 NOTE — H&P (Signed)
Dan [facility] 04/24/2017 9:09 AM Location: [facility] Patient #: [id number] DOB: [birth year] Married / Language: Dan [facility] / Race: White Male  History of Present Illness Dan Hiss M. [name]; 04/24/2017 9:36 AM) The patient is a 57 year old male who presents with an inguinal hernia. He comes in today to rediscuss his massive left inguinal hernia. He is accompanied by his significant other. He has been seeing his primary care team and has been taking his blood pressure medication and his blood pressure is much better. When I initially met him back in June his blood pressure was massively uncontrolled. He denies any headaches or vision changes. He denies any nausea, vomiting, diarrhea or constipation. He denies any inguinal discomfort. He states that he does get a little bit winded with physical activity but denies shortness of breath at rest. He denies any chest pain, chest pressure, TIAs or amaurosis fugax. He denies any jaw pain or left upper extremity pain.   Problem List/Past Medical Dan Hiss M. 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 [name], MD; 04/24/2017 9:36 AM) No pertinent past surgical history  Diagnostic Studies History Dan Hiss M. [name], MD; 04/24/2017 9:36 AM) Colonoscopy never  Allergies Dan Salazar, Salazar; 04/24/2017 9:10 AM) HYDROCHLOROTHIAZIDE  Medication History Dan Salazar, [place]; 04/24/2017 9:10 AM) Lisinopril (40MG Tablet, Oral) Active. Medications Reconciled  Social History Dan Hiss M. [name], MD; 04/24/2017 9:36 AM) Alcohol use Occasional alcohol use. Caffeine use Carbonated beverages. Illicit drug use Recently quit drug use. Tobacco use Never smoker.  Family History Dan Hiss M. [name], MD; 04/24/2017 9:36 AM) Breast Cancer Mother. Depression Daughter. Diabetes Mellitus Mother. Prostate Cancer Father.     Review of Systems Dan Hiss M. Dan Alvidrez  MD; 04/24/2017 9:34 AM) General Not Present- Appetite Loss, Chills, Fatigue, Fever, Night Sweats, Weight Gain and Weight Loss. Skin Not Present- Change in Wart/Mole, Dryness, Hives, Jaundice, New Lesions, Non-Healing Wounds, Rash and Ulcer. HEENT Not Present- Earache, Hearing Loss, Hoarseness, Nose Bleed, Oral Ulcers, Ringing in the Ears, Seasonal Allergies, Sinus Pain, Sore Throat, Visual Disturbances, Wears glasses/contact lenses and Yellow Eyes. Respiratory Present- Difficulty Breathing and Snoring. Not Present- Bloody sputum, Chronic Cough and Wheezing. Breast Not Present- Breast Mass, Breast Pain, Nipple Discharge and Skin Changes. Cardiovascular Not Present- Chest Pain, Difficulty Breathing Lying Down, Leg Cramps, Palpitations, Rapid Heart Rate, Shortness of Breath and Swelling of Extremities. Gastrointestinal Present- Hemorrhoids. Not Present- Abdominal Pain, Bloating, Bloody Stool, Change in Bowel Habits, Chronic diarrhea, Constipation, Difficulty Swallowing, Excessive gas, Gets full quickly at meals, Indigestion, Nausea, Rectal Pain and Vomiting. Male Genitourinary Not Present- Blood in Urine, Change in Urinary Stream, Frequency, Impotence, Nocturia, Painful Urination, Urgency and Urine Leakage.  Vitals Dan Salazar; 04/24/2017 9:10 AM) 04/24/2017 9:10 AM Weight: 182.6 lb Height: 74in Body Surface Area: 2.09 m Body Mass Index: 23.44 kg/m  Temp.: 98.30F  Pulse: 52 (Regular)  BP: 120/80 (Sitting, Left Arm, Standard)      Physical Exam Dan Hiss M. Dan Stensland MD; 04/24/2017 9:33 AM)  General Mental Status-Alert. General Appearance-Consistent with stated age. Hydration-Well hydrated. Voice-Normal.  Head and Neck Head-normocephalic, atraumatic with no lesions or palpable masses. Trachea-midline. Thyroid Gland Characteristics - normal size and consistency.  Eye Eyeball - Bilateral-Extraocular movements intact. Sclera/Conjunctiva - Bilateral-No  scleral icterus.  ENMT Note: ears - normal ext ears mouth - lips intact  Chest and Lung Exam Chest and lung exam reveals -quiet, even and easy respiratory effort with no use of accessory muscles and  on auscultation, normal breath sounds, no adventitious sounds and normal vocal resonance. Inspection Chest Wall - Normal. Back - normal.  Breast - Did not examine.  Cardiovascular Cardiovascular examination reveals -normal heart sounds, regular rate and rhythm with no murmurs and normal pedal pulses bilaterally.  Abdomen Inspection Inspection of the abdomen reveals - No Hernias. Skin - Scar - no surgical scars. Palpation/Percussion Palpation and Percussion of the abdomen reveal - Soft, Non Tender, No Rebound tenderness, No Rigidity (guarding) and No hepatosplenomegaly. Auscultation Auscultation of the abdomen reveals - Bowel sounds normal.  Male Genitourinary Note: Massive left inguinal bulge extends into scrotum., Soft, nontender, nonreducible  Peripheral Vascular Upper Extremity Palpation - Pulses bilaterally normal.  Neurologic Neurologic evaluation reveals -alert and oriented x 3 with no impairment of recent or remote memory. Mental Status-Normal.  Neuropsychiatric The patient's mood and affect are described as -normal. Judgment and Insight-insight is appropriate concerning matters relevant to self.  Musculoskeletal Normal Exam - Left-Upper Extremity Strength Normal and Lower Extremity Strength Normal. Normal Exam - Right-Upper Extremity Strength Normal and Lower Extremity Strength Normal.  Lymphatic Head & Neck  General Head & Neck Lymphatics: Bilateral - Description - Normal. Axillary - Did not examine. Femoral & Inguinal - Did not examine.    Assessment & Plan Dan Hiss M. [name] MD; 04/24/2017 9:33 AM)  [name] LEFT INGUINAL HERNIA (K40.30) Impression: We discussed the etiology of inguinal hernias. We discussed the signs & symptoms of  incarceration & strangulation. We discussed non-operative and operative management.  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  We discussed diagnostic laparoscopy with open repair with mesh I described the procedure in detail. The patient was given educational material. We discussed the risks and benefits including but not limited to bleeding, infection, chronic inguinal pain, nerve entrapment, hernia recurrence, mesh complications, hematoma formation, urinary retention, injury to the testicle, numbness in the groin, blood clots, injury to the surrounding structures, and anesthesia risk. We also discussed the typical post operative recovery course, including no heavy lifting for 6 weeks. I explained that the likelihood of improvement of their symptoms is fair to good  I explained that he was at high risk for hematoma formation, recurrence, testicular loss, chronic numbness and/or pain in his groin due to the sheer size of his inguinal hernia  Current Plans Pt Education - Pamphlet Given - Hernia Surgery: discussed with patient and provided information. HYPERTENSION, ESSENTIAL (I10) Impression: It appears the patient has poor insight into his underlying medical history. He was unaware why he was taking the pills. Specifically he did not know he had a diagnosis of hypertension. He states that he ran out of the pills a few months ago. It sounded like they started a second agent in January for refractory hypertension namely HCTZ to his lisinopril. He ended up in [facility] with a left tried abnormalities in March probably due to diuretic usage. He currently is not taking any medication for hypertension. I explained that he would have to follow-up with his primary care team prior to scheduling surgery to get back on hypertension medication  Leighton Ruff. [name], MD, FACS General, Bariatric, & Minimally Invasive Surgery [facility], [place]

## 2017-04-29 ENCOUNTER — Encounter (HOSPITAL_COMMUNITY)
Admission: RE | Admit: 2017-04-29 | Discharge: 2017-04-29 | Disposition: A | Discharge status: Home / Self Care | Payer: Self-pay | Source: Ambulatory Visit | Attending: General Surgery | Admitting: General Surgery

## 2017-04-29 ENCOUNTER — Encounter (HOSPITAL_COMMUNITY): Payer: Self-pay

## 2017-04-29 DIAGNOSIS — K4030 Unilateral inguinal hernia, with obstruction, without gangrene, not specified as recurrent: Secondary | ICD-10-CM | POA: Insufficient documentation

## 2017-04-29 DIAGNOSIS — I10 Essential (primary) hypertension: Secondary | ICD-10-CM | POA: Insufficient documentation

## 2017-04-29 DIAGNOSIS — Z01812 Encounter for preprocedural laboratory examination: Secondary | ICD-10-CM | POA: Insufficient documentation

## 2017-04-29 HISTORY — DX: Dyspnea, unspecified: R06.00

## 2017-04-29 LAB — CBC WITH DIFFERENTIAL/PLATELET
BASOS ABS: 0 10*3/uL (ref 0.0–0.1)
Basophils Relative: 0 %
EOS PCT: 1 %
Eosinophils Absolute: 0.1 10*3/uL (ref 0.0–0.7)
HEMATOCRIT: 45 % (ref 39.0–52.0)
HEMOGLOBIN: 15.6 g/dL (ref 13.0–17.0)
LYMPHS PCT: 14 %
Lymphs Abs: 1.1 10*3/uL (ref 0.7–4.0)
MCH: 32.4 pg (ref 26.0–34.0)
MCHC: 34.7 g/dL (ref 30.0–36.0)
MCV: 93.4 fL (ref 78.0–100.0)
Monocytes Absolute: 0.5 10*3/uL (ref 0.1–1.0)
Monocytes Relative: 7 %
NEUTROS ABS: 6 10*3/uL (ref 1.7–7.7)
NEUTROS PCT: 78 %
PLATELETS: 199 10*3/uL (ref 150–400)
RBC: 4.82 MIL/uL (ref 4.22–5.81)
RDW: 13.8 % (ref 11.5–15.5)
WBC: 7.7 10*3/uL (ref 4.0–10.5)

## 2017-04-29 LAB — COMPREHENSIVE METABOLIC PANEL
ALK PHOS: 68 U/L (ref 38–126)
ALT: 28 U/L (ref 17–63)
AST: 23 U/L (ref 15–41)
Albumin: 3.8 g/dL (ref 3.5–5.0)
Anion gap: 7 (ref 5–15)
BUN: 16 mg/dL (ref 6–20)
CHLORIDE: 101 mmol/L (ref 101–111)
CO2: 28 mmol/L (ref 22–32)
CREATININE: 1.24 mg/dL (ref 0.61–1.24)
Calcium: 8.8 mg/dL — ABNORMAL LOW (ref 8.9–10.3)
GFR calc Af Amer: >60 mL/min (ref >60)
Glucose, Bld: 99 mg/dL (ref 65–99)
Potassium: 4.1 mmol/L (ref 3.5–5.1)
Sodium: 136 mmol/L (ref 135–145)
Total Bilirubin: 0.8 mg/dL (ref 0.3–1.2)
Total Protein: 7 g/dL (ref 6.5–8.1)

## 2017-04-29 NOTE — Progress Notes (Signed)
ekg 09/29/16 chart cxr 09/28/16 epic Echo 09/29/16 epic

## 2017-04-30 ENCOUNTER — Encounter (HOSPITAL_COMMUNITY)
Admission: RE | Disposition: A | Discharge status: Home / Self Care | Payer: Self-pay | Source: Ambulatory Visit | Attending: General Surgery

## 2017-04-30 ENCOUNTER — Ambulatory Visit (HOSPITAL_COMMUNITY): Payer: Self-pay | Admitting: Certified Registered Nurse Anesthetist

## 2017-04-30 ENCOUNTER — Observation Stay (HOSPITAL_COMMUNITY)
Admission: RE | Admit: 2017-04-30 | Discharge: 2017-05-01 | Disposition: A | Discharge status: Home / Self Care | Payer: Self-pay | Source: Ambulatory Visit | Attending: General Surgery | Admitting: General Surgery

## 2017-04-30 ENCOUNTER — Encounter (HOSPITAL_COMMUNITY): Payer: Self-pay | Admitting: Certified Registered Nurse Anesthetist

## 2017-04-30 DIAGNOSIS — Z79899 Other long term (current) drug therapy: Secondary | ICD-10-CM | POA: Insufficient documentation

## 2017-04-30 DIAGNOSIS — Z888 Allergy status to other drugs, medicaments and biological substances status: Secondary | ICD-10-CM | POA: Insufficient documentation

## 2017-04-30 DIAGNOSIS — Q549 Hypospadias, unspecified: Secondary | ICD-10-CM | POA: Insufficient documentation

## 2017-04-30 DIAGNOSIS — K4030 Unilateral inguinal hernia, with obstruction, without gangrene, not specified as recurrent: Principal | ICD-10-CM | POA: Insufficient documentation

## 2017-04-30 DIAGNOSIS — I10 Essential (primary) hypertension: Secondary | ICD-10-CM | POA: Insufficient documentation

## 2017-04-30 HISTORY — PX: LAPAROSCOPY: SHX197

## 2017-04-30 HISTORY — PX: INSERTION OF MESH: SHX5868

## 2017-04-30 HISTORY — PX: INGUINAL HERNIA REPAIR: SHX194

## 2017-04-30 SURGERY — LAPAROSCOPY, DIAGNOSTIC
Anesthesia: General | Site: Groin

## 2017-04-30 MED ORDER — ROCURONIUM BROMIDE 10 MG/ML (PF) SYRINGE
PREFILLED_SYRINGE | INTRAVENOUS | Status: DC | PRN
  Administered 2017-04-30: 50 mg via INTRAVENOUS
  Administered 2017-04-30: 20 mg via INTRAVENOUS

## 2017-04-30 MED ORDER — FENTANYL CITRATE (PF) 100 MCG/2ML IJ SOLN
INTRAMUSCULAR | Status: AC
Start: 2017-04-30 — End: 2017-04-30
  Filled 2017-04-30: qty 2

## 2017-04-30 MED ORDER — ENOXAPARIN SODIUM 40 MG/0.4ML ~~LOC~~ SOLN
40.0000 mg | SUBCUTANEOUS | Status: DC
  Administered 2017-05-01: 40 mg via SUBCUTANEOUS
  Filled 2017-04-30: qty 0.4

## 2017-04-30 MED ORDER — SUCCINYLCHOLINE CHLORIDE 200 MG/10ML IV SOSY
PREFILLED_SYRINGE | INTRAVENOUS | Status: AC
  Filled 2017-04-30: qty 10

## 2017-04-30 MED ORDER — MORPHINE SULFATE (PF) 2 MG/ML IV SOLN: 1.0000 mg | INTRAVENOUS | Status: DC | PRN

## 2017-04-30 MED ORDER — PROPOFOL 10 MG/ML IV BOLUS
INTRAVENOUS | Status: DC | PRN
  Administered 2017-04-30: 160 mg via INTRAVENOUS
  Administered 2017-04-30: 40 mg via INTRAVENOUS

## 2017-04-30 MED ORDER — LACTATED RINGERS IV SOLN
INTRAVENOUS | Status: DC
  Administered 2017-04-30 (×3): via INTRAVENOUS

## 2017-04-30 MED ORDER — GLYCOPYRROLATE 0.2 MG/ML IV SOSY
PREFILLED_SYRINGE | INTRAVENOUS | Status: AC
  Filled 2017-04-30: qty 5

## 2017-04-30 MED ORDER — SODIUM CHLORIDE 0.9 % IJ SOLN
INTRAMUSCULAR | Status: AC
  Filled 2017-04-30: qty 50

## 2017-04-30 MED ORDER — POLYMYXIN B SULFATE 500000 UNITS IJ SOLR
INTRAMUSCULAR | Status: AC
  Filled 2017-04-30: qty 500000

## 2017-04-30 MED ORDER — ONDANSETRON HCL 4 MG/2ML IJ SOLN
INTRAMUSCULAR | Status: DC | PRN
  Administered 2017-04-30: 4 mg via INTRAVENOUS

## 2017-04-30 MED ORDER — BUPIVACAINE HCL (PF) 0.5 % IJ SOLN
INTRAMUSCULAR | Status: DC | PRN
  Administered 2017-04-30: 30 mL

## 2017-04-30 MED ORDER — LIDOCAINE HCL 2 % IJ SOLN
INTRAMUSCULAR | Status: AC
  Filled 2017-04-30: qty 20

## 2017-04-30 MED ORDER — FENTANYL CITRATE (PF) 100 MCG/2ML IJ SOLN
100.0000 ug | Freq: Once | INTRAMUSCULAR | Status: AC
  Administered 2017-04-30: 100 ug via INTRAVENOUS

## 2017-04-30 MED ORDER — DIPHENHYDRAMINE HCL 50 MG/ML IJ SOLN: 12.5000 mg | Freq: Four times a day (QID) | INTRAMUSCULAR | Status: DC | PRN

## 2017-04-30 MED ORDER — GABAPENTIN 100 MG PO CAPS
200.0000 mg | ORAL_CAPSULE | Freq: Two times a day (BID) | ORAL | Status: DC
  Administered 2017-04-30: 200 mg via ORAL
  Filled 2017-04-30 (×2): qty 2

## 2017-04-30 MED ORDER — PROMETHAZINE HCL 25 MG/ML IJ SOLN: 6.2500 mg | INTRAMUSCULAR | Status: DC | PRN

## 2017-04-30 MED ORDER — ACETAMINOPHEN 500 MG PO TABS
1000.0000 mg | ORAL_TABLET | Freq: Four times a day (QID) | ORAL | Status: DC
  Administered 2017-04-30 – 2017-05-01 (×3): 1000 mg via ORAL
  Filled 2017-04-30 (×3): qty 2

## 2017-04-30 MED ORDER — MIDAZOLAM HCL 2 MG/2ML IJ SOLN
INTRAMUSCULAR | Status: AC
  Administered 2017-04-30: 2 mg via INTRAVENOUS
  Filled 2017-04-30: qty 2

## 2017-04-30 MED ORDER — GABAPENTIN 300 MG PO CAPS
300.0000 mg | ORAL_CAPSULE | ORAL | Status: AC
  Administered 2017-04-30: 300 mg via ORAL
  Filled 2017-04-30: qty 1

## 2017-04-30 MED ORDER — LIDOCAINE 2% (20 MG/ML) 5 ML SYRINGE
INTRAMUSCULAR | Status: DC | PRN
  Administered 2017-04-30: 100 mg via INTRAVENOUS

## 2017-04-30 MED ORDER — FENTANYL CITRATE (PF) 250 MCG/5ML IJ SOLN
INTRAMUSCULAR | Status: AC
  Filled 2017-04-30: qty 5

## 2017-04-30 MED ORDER — CEFAZOLIN SODIUM-DEXTROSE 2-4 GM/100ML-% IV SOLN
2.0000 g | INTRAVENOUS | Status: AC
  Administered 2017-04-30: 2 g via INTRAVENOUS
  Filled 2017-04-30: qty 100

## 2017-04-30 MED ORDER — TRAMADOL HCL 50 MG PO TABS: 50.0000 mg | ORAL_TABLET | Freq: Four times a day (QID) | ORAL | Status: DC | PRN

## 2017-04-30 MED ORDER — PROPOFOL 10 MG/ML IV BOLUS
INTRAVENOUS | Status: AC
  Filled 2017-04-30: qty 20

## 2017-04-30 MED ORDER — SODIUM CHLORIDE 0.9 % IV SOLN
INTRAVENOUS | Status: DC | PRN
  Administered 2017-04-30: 500 mL

## 2017-04-30 MED ORDER — LISINOPRIL 20 MG PO TABS
40.0000 mg | ORAL_TABLET | Freq: Every day | ORAL | Status: DC
  Administered 2017-04-30: 40 mg via ORAL
  Filled 2017-04-30 (×2): qty 2

## 2017-04-30 MED ORDER — KETAMINE HCL 10 MG/ML IJ SOLN
INTRAMUSCULAR | Status: DC | PRN
  Administered 2017-04-30: 30 mg via INTRAVENOUS

## 2017-04-30 MED ORDER — SODIUM CHLORIDE 0.9 % IJ SOLN
INTRAMUSCULAR | Status: DC | PRN
  Administered 2017-04-30: 50 mL

## 2017-04-30 MED ORDER — CHLORHEXIDINE GLUCONATE 4 % EX LIQD: 60.0000 mL | Freq: Once | CUTANEOUS | Status: DC

## 2017-04-30 MED ORDER — FENTANYL CITRATE (PF) 100 MCG/2ML IJ SOLN
INTRAMUSCULAR | Status: DC | PRN
  Administered 2017-04-30 (×3): 100 ug via INTRAVENOUS
  Administered 2017-04-30: 50 ug via INTRAVENOUS

## 2017-04-30 MED ORDER — KETOROLAC TROMETHAMINE 30 MG/ML IJ SOLN
INTRAMUSCULAR | Status: AC
  Filled 2017-04-30: qty 1

## 2017-04-30 MED ORDER — POTASSIUM CHLORIDE IN NACL 20-0.9 MEQ/L-% IV SOLN
INTRAVENOUS | Status: DC
  Administered 2017-04-30: 17:00:00 via INTRAVENOUS
  Filled 2017-04-30: qty 1000

## 2017-04-30 MED ORDER — MIDAZOLAM HCL 2 MG/2ML IJ SOLN
INTRAMUSCULAR | Status: AC
  Filled 2017-04-30: qty 2

## 2017-04-30 MED ORDER — ONDANSETRON 4 MG PO TBDP: 4.0000 mg | ORAL_TABLET | Freq: Four times a day (QID) | ORAL | Status: DC | PRN

## 2017-04-30 MED ORDER — ROCURONIUM BROMIDE 50 MG/5ML IV SOSY
PREFILLED_SYRINGE | INTRAVENOUS | Status: AC
  Filled 2017-04-30: qty 5

## 2017-04-30 MED ORDER — DIPHENHYDRAMINE HCL 12.5 MG/5ML PO ELIX: 12.5000 mg | ORAL_SOLUTION | Freq: Four times a day (QID) | ORAL | Status: DC | PRN

## 2017-04-30 MED ORDER — ONDANSETRON HCL 4 MG/2ML IJ SOLN: 4.0000 mg | Freq: Four times a day (QID) | INTRAMUSCULAR | Status: DC | PRN

## 2017-04-30 MED ORDER — ONDANSETRON HCL 4 MG/2ML IJ SOLN
INTRAMUSCULAR | Status: AC
  Filled 2017-04-30: qty 2

## 2017-04-30 MED ORDER — SUGAMMADEX SODIUM 200 MG/2ML IV SOLN
INTRAVENOUS | Status: AC
  Filled 2017-04-30: qty 2

## 2017-04-30 MED ORDER — PHENYLEPHRINE 40 MCG/ML (10ML) SYRINGE FOR IV PUSH (FOR BLOOD PRESSURE SUPPORT)
PREFILLED_SYRINGE | INTRAVENOUS | Status: AC
  Filled 2017-04-30: qty 10

## 2017-04-30 MED ORDER — GLYCOPYRROLATE 0.2 MG/ML IV SOSY
PREFILLED_SYRINGE | INTRAVENOUS | Status: DC | PRN
  Administered 2017-04-30: 0.4 mg via INTRAVENOUS

## 2017-04-30 MED ORDER — PHENYLEPHRINE 40 MCG/ML (10ML) SYRINGE FOR IV PUSH (FOR BLOOD PRESSURE SUPPORT)
PREFILLED_SYRINGE | INTRAVENOUS | Status: DC | PRN
  Administered 2017-04-30 (×5): 80 ug via INTRAVENOUS

## 2017-04-30 MED ORDER — DEXAMETHASONE SODIUM PHOSPHATE 10 MG/ML IJ SOLN
INTRAMUSCULAR | Status: AC
  Filled 2017-04-30: qty 1

## 2017-04-30 MED ORDER — KETAMINE HCL-SODIUM CHLORIDE 100-0.9 MG/10ML-% IV SOSY
PREFILLED_SYRINGE | INTRAVENOUS | Status: AC
  Filled 2017-04-30: qty 10

## 2017-04-30 MED ORDER — 0.9 % SODIUM CHLORIDE (POUR BTL) OPTIME
TOPICAL | Status: DC | PRN
  Administered 2017-04-30: 1000 mL

## 2017-04-30 MED ORDER — BUPIVACAINE LIPOSOME 1.3 % IJ SUSP
20.0000 mL | Freq: Once | INTRAMUSCULAR | Status: AC
  Administered 2017-04-30: 20 mL
  Filled 2017-04-30: qty 20

## 2017-04-30 MED ORDER — SUGAMMADEX SODIUM 200 MG/2ML IV SOLN
INTRAVENOUS | Status: DC | PRN
  Administered 2017-04-30: 200 mg via INTRAVENOUS

## 2017-04-30 MED ORDER — MIDAZOLAM HCL 2 MG/2ML IJ SOLN
2.0000 mg | Freq: Once | INTRAMUSCULAR | Status: AC
  Administered 2017-04-30: 2 mg via INTRAVENOUS

## 2017-04-30 MED ORDER — FENTANYL CITRATE (PF) 100 MCG/2ML IJ SOLN
INTRAMUSCULAR | Status: AC
  Administered 2017-04-30: 100 ug via INTRAVENOUS
  Filled 2017-04-30: qty 2

## 2017-04-30 MED ORDER — SIMETHICONE 80 MG PO CHEW: 40.0000 mg | CHEWABLE_TABLET | Freq: Four times a day (QID) | ORAL | Status: DC | PRN

## 2017-04-30 MED ORDER — DOCUSATE SODIUM 100 MG PO CAPS
100.0000 mg | ORAL_CAPSULE | Freq: Two times a day (BID) | ORAL | Status: DC
  Administered 2017-04-30: 100 mg via ORAL
  Filled 2017-04-30 (×3): qty 1

## 2017-04-30 MED ORDER — DEXAMETHASONE SODIUM PHOSPHATE 4 MG/ML IJ SOLN
INTRAMUSCULAR | Status: DC | PRN
  Administered 2017-04-30: 10 mg via INTRAVENOUS

## 2017-04-30 MED ORDER — KETOROLAC TROMETHAMINE 30 MG/ML IJ SOLN
30.0000 mg | Freq: Once | INTRAMUSCULAR | Status: DC | PRN
  Administered 2017-04-30: 30 mg via INTRAVENOUS

## 2017-04-30 MED ORDER — LIDOCAINE 2% (20 MG/ML) 5 ML SYRINGE
INTRAMUSCULAR | Status: AC
  Filled 2017-04-30: qty 5

## 2017-04-30 MED ORDER — ACETAMINOPHEN 500 MG PO TABS
1000.0000 mg | ORAL_TABLET | ORAL | Status: AC
  Administered 2017-04-30: 1000 mg via ORAL
  Filled 2017-04-30: qty 2

## 2017-04-30 MED ORDER — HYDROMORPHONE HCL-NACL 0.5-0.9 MG/ML-% IV SOSY: 0.2500 mg | PREFILLED_SYRINGE | INTRAVENOUS | Status: DC | PRN

## 2017-04-30 MED ORDER — LIDOCAINE 2% (20 MG/ML) 5 ML SYRINGE
INTRAMUSCULAR | Status: DC | PRN
  Administered 2017-04-30: 1.5 mg/kg/h via INTRAVENOUS

## 2017-04-30 SURGICAL SUPPLY — 72 items
APPLIER CLIP 5 13 M/L LIGAMAX5 (MISCELLANEOUS)
APPLIER CLIP ROT 10 11.4 M/L (STAPLE)
BENZOIN TINCTURE PRP APPL 2/3 (GAUZE/BANDAGES/DRESSINGS) IMPLANT
BLADE EXTENDED COATED 6.5IN (ELECTRODE) IMPLANT
BLADE SURG SZ10 CARB STEEL (BLADE) IMPLANT
CELLS DAT CNTRL 66122 CELL SVR (MISCELLANEOUS) IMPLANT
CHLORAPREP W/TINT 26ML (MISCELLANEOUS) ×5 IMPLANT
CLIP APPLIE 5 13 M/L LIGAMAX5 (MISCELLANEOUS) IMPLANT
CLIP APPLIE ROT 10 11.4 M/L (STAPLE) IMPLANT
CLOSURE WOUND 1/2 X4 (GAUZE/BANDAGES/DRESSINGS) ×1
COVER MAYO STAND STRL (DRAPES) IMPLANT
COVER SURGICAL LIGHT HANDLE (MISCELLANEOUS) ×5 IMPLANT
DECANTER SPIKE VIAL GLASS SM (MISCELLANEOUS) ×10 IMPLANT
DERMABOND ADVANCED (GAUZE/BANDAGES/DRESSINGS) ×2
DERMABOND ADVANCED .7 DNX12 (GAUZE/BANDAGES/DRESSINGS) ×3 IMPLANT
DRAIN PENROSE 18X1/2 LTX STRL (DRAIN) ×5 IMPLANT
DRAPE LAPAROTOMY T 98X78 PEDS (DRAPES) ×5 IMPLANT
DRAPE SHEET LG 3/4 BI-LAMINATE (DRAPES) IMPLANT
DRAPE WARM FLUID 44X44 (DRAPE) IMPLANT
DRSG TEGADERM 4X4.75 (GAUZE/BANDAGES/DRESSINGS) IMPLANT
ELECT PENCIL ROCKER SW 15FT (MISCELLANEOUS) IMPLANT
ELECT REM PT RETURN 15FT ADLT (MISCELLANEOUS) ×5 IMPLANT
GAUZE SPONGE 4X4 12PLY STRL (GAUZE/BANDAGES/DRESSINGS) ×5 IMPLANT
GLOVE BIO SURGEON STRL SZ7.5 (GLOVE) ×5 IMPLANT
GLOVE BIOGEL M STRL SZ7.5 (GLOVE) ×5 IMPLANT
GLOVE INDICATOR 8.0 STRL GRN (GLOVE) ×5 IMPLANT
GOWN STRL REUS W/TWL XL LVL3 (GOWN DISPOSABLE) ×20 IMPLANT
HANDLE SUCTION POOLE (INSTRUMENTS) IMPLANT
IRRIG SUCT STRYKERFLOW 2 WTIP (MISCELLANEOUS)
IRRIGATION SUCT STRKRFLW 2 WTP (MISCELLANEOUS) IMPLANT
KIT BASIN OR (CUSTOM PROCEDURE TRAY) ×5 IMPLANT
LEGGING LITHOTOMY PAIR STRL (DRAPES) IMPLANT
MESH ULTRAPRO 3X6 7.6X15CM (Mesh General) ×5 IMPLANT
NEEDLE HYPO 22GX1.5 SAFETY (NEEDLE) ×5 IMPLANT
PACK GENERAL/GYN (CUSTOM PROCEDURE TRAY) IMPLANT
RTRCTR WOUND ALEXIS 18CM MED (MISCELLANEOUS)
SCISSORS LAP 5X35 DISP (ENDOMECHANICALS) ×5 IMPLANT
SEALER TISSUE X1 CVD JAW (INSTRUMENTS) IMPLANT
SHEARS HARMONIC ACE PLUS 36CM (ENDOMECHANICALS) IMPLANT
SLEEVE XCEL OPT CAN 5 100 (ENDOMECHANICALS) ×15 IMPLANT
SPONGE LAP 18X18 X RAY DECT (DISPOSABLE) ×5 IMPLANT
STAPLER VISISTAT 35W (STAPLE) IMPLANT
STRIP CLOSURE SKIN 1/2X4 (GAUZE/BANDAGES/DRESSINGS) ×4 IMPLANT
SUCTION POOLE HANDLE (INSTRUMENTS)
SUT MNCRL AB 4-0 PS2 18 (SUTURE) ×5 IMPLANT
SUT PDS AB 1 TP1 96 (SUTURE) IMPLANT
SUT PROLENE 2 0 CT2 30 (SUTURE) ×30 IMPLANT
SUT PROLENE 2 0 KS (SUTURE) IMPLANT
SUT PROLENE 2 0 SH DA (SUTURE) IMPLANT
SUT SILK 2 0 (SUTURE)
SUT SILK 2 0 SH CR/8 (SUTURE) IMPLANT
SUT SILK 2-0 18XBRD TIE 12 (SUTURE) IMPLANT
SUT SILK 3 0 (SUTURE)
SUT SILK 3 0 SH CR/8 (SUTURE) IMPLANT
SUT SILK 3-0 18XBRD TIE 12 (SUTURE) IMPLANT
SUT VIC AB 2-0 SH 18 (SUTURE) ×15 IMPLANT
SUT VIC AB 2-0 SH 27 (SUTURE) ×4
SUT VIC AB 2-0 SH 27X BRD (SUTURE) ×6 IMPLANT
SUT VIC AB 3-0 SH 18 (SUTURE) ×10 IMPLANT
SYR 20CC LL (SYRINGE) ×5 IMPLANT
SYR BULB IRRIGATION 50ML (SYRINGE) IMPLANT
SYS LAPSCP GELPORT 120MM (MISCELLANEOUS)
SYSTEM LAPSCP GELPORT 120MM (MISCELLANEOUS) IMPLANT
TOWEL OR 17X26 10 PK STRL BLUE (TOWEL DISPOSABLE) ×5 IMPLANT
TOWEL OR NON WOVEN STRL DISP B (DISPOSABLE) ×5 IMPLANT
TRAY FOLEY W/METER SILVER 16FR (SET/KITS/TRAYS/PACK) ×5 IMPLANT
TRAY LAP CHOLE (CUSTOM PROCEDURE TRAY) ×5 IMPLANT
TRAY LAPAROSCOPIC (CUSTOM PROCEDURE TRAY) ×5 IMPLANT
TROCAR BLADELESS OPT 5 100 (ENDOMECHANICALS) ×5 IMPLANT
TROCAR XCEL NON-BLD 11X100MML (ENDOMECHANICALS) IMPLANT
YANKAUER SUCT BULB TIP 10FT TU (MISCELLANEOUS) IMPLANT
YANKAUER SUCT BULB TIP NO VENT (SUCTIONS) ×5 IMPLANT

## 2017-04-30 NOTE — Op Note (Signed)
Dan Salazar [id number] 1960-12-20 04/30/2017   Diagnostic laparoscopy, Open Repair of Massive Incarcerated Left direct Inguinal Hernia with Mesh Procedure Note  Indications: The patient presented with a history of a left, not reducible hernia.    Pre-operative Diagnosis: Massive left incarcerated inguinal hernia  Post-operative Diagnosis: Massive left direct incarcerated inguinal hernia  Surgeon: Gayland Curry  MD FACS  Assistants: Alphonsa Overall MD FACS (an assistant was required for this hernia given the fact of its massive size, assistance in identifying critical structures)  Anesthesia: General endotracheal anesthesia and L TAP block   Surgeon: Leighton Ruff. [name], MD, FACS  Procedure Details  The patient was seen again in the Holding Room. The risks, benefits, complications, treatment options, and expected outcomes were discussed with the patient. The possibilities of reaction to medication, pulmonary aspiration, perforation of viscus, bleeding, recurrent infection, the need for additional procedures, and development of a complication requiring transfusion or further operation were discussed with the patient and/or family. The likelihood of success in repairing the hernia and returning the patient to their previous functional status is good. Patient underwent a tap block by anesthesia. He also received ERAS medications. There was concurrence with the proposed plan, and informed consent was obtained. The site of surgery was properly noted/marked. The patient was taken to the Operating Room, identified as [facility], and the procedure verified as diagnostic laparoscopy, left inguinal hernia repair, possible orchiectomy. A Time Out was held and the above information confirmed.  The patient was placed in the supine position and underwent induction of anesthesia. A Foley catheter was placed using sterile technique. It should be noted that the patient has hypospadias. The catheter passed with  ease.The lower abdomen and groin was prepped with Chloraprep and the scrotum and penis prepped with Betadine and draped in the standard fashion. Laparoscopy was done first. This was done in order to identify the nature of the herniated contents. Optiview te Technique was used in the left upper quadrant. A small incision was made just below the left subcostal margin. A 0 5 mm laparoscope was advanced through a 5 mm trocar and carefully advanced through all layers of the abdominal wall and entered the abdominal cavity. Pneumoperitoneum was smoothly established up to a patient pressure of 15 mmHg. The sigmoid colon appeared to be herniated into the left groin hernia along with omentum. There is evidence of a small right indirect inguinal hernia. A 5 mm trocar was placed just above the umbilicand another 5 in the right upper quadrant all under direct visualization. I tried to reduce the omentum and sigmoid colon. It appeared that the sigmoid colon was potentially part of the hernia sac as in a sliding hernia. I was able to reduce some of the omentum but there is still a large bulky mass herniated. I could not do anymore laparoscopically.    An generous oblique incision was made over the inguinal canal. Dissection was carried down through the subcutaneous tissue with cautery to the external oblique fascia.  We opened the external oblique fascia along the direction of its fibers to the external ring. There was a large amount of herniated contents. Cremasterics fibers were taken down with Bovie electrocautery. Pubic tubercle was identified. It took about 20 minutes to reduce the hernia sac from the scrotum. The testicle was also delivered into the operative field. We were able to tease the hernia sac with its contents away from the testicle and cord structures. I ended up opening  The thickened hernia sac. Within  it was a large amount of omentum as well as portions of the sigmoid colon. There was a distal part of the  omentum that was chronically indurated and essentially in the shape of a large lemon. I was unable to reduce it through the small direct defect and therefore decided to reduce and amputate the majority of the herniated omentum. This was done with a series of Kelly's and 2-0 Vicryl ties. 2-0 Vicryl suture stick ties were placed on the patient aspect of the transected omentum. At this point I was able to reduce the remaining omentum back into the abdominal cavity. The spermatic cord was circumferentially dissected bluntly and retracted with a Penrose drain.  The floor of the inguinal canal was inspected and there was a decent sized direct defect.  We skeletonized the spermatic cord and was able to reduce the herniated direct contents back into the abdominal cavity. I then placed 3 interrupted 2-0 Prolene sutures between the conjoined tendon and the shelving edge of the inguinal ligament to reapproximate the floor of the inguinal canal. We used a 3 x 6 inch piece of Ultrapro mesh, which was cut into a keyhole shape.  This was secured with 2-0 Prolene, beginning at the pubic tubercle, running this along the shelving edge inferiorly. Superiorly, the mesh was secured to the internal oblique fascia with interrupted 2-0 Prolene sutures.  The tails of the mesh were sutured together behind the spermatic cord.  The mesh was tucked underneath the external oblique fascia laterally.   I then reestablished pneumoperitoneum. The reduced colon Appeared viable. There is no evidence of injury to the colonic wall. There was some oozing from some of the omentum which was taking care of with cautery. The abdominal cavity was irrigated and evacuated. There is no evidence of ongoing bleeding. I infiltrated Exparel in bilateral lateral upper abdominal walls and lower abdominal walls. Pneumoperitoneum was released.  I then irrigated the inguinal wound with triple antibiotic irrigation.  The external oblique fascia was reapproximated  with 2-0 Vicryl.  3-0 Vicryl was used to close the subcutaneous tissues and 4-0 Monocryl was used to close the skin in subcuticular fashion.  4-0 Monocryl was used to close the 5 mm trocar sites. Dermabond was used to seal the incision.    The patient was then extubated and brought to the recovery room in stable condition.  All sponge, instrument, and needle counts were correct prior to closure and at the conclusion of the case.   Estimated Blood Loss: less than 50 mL                 Complications: None; patient tolerated the procedure well.         Disposition: PACU - hemodynamically stable.         Condition: stable  Leighton Ruff. [name], MD, FACS General, Bariatric, & Minimally Invasive Surgery [facility], [place]

## 2017-04-30 NOTE — H&P (View-Only) (Signed)
Dan Salazar 04/24/2017 9:09 AM Location: [facility] Patient #: [id number] DOB: 03/13/1961 Married / Language: English / Race: White Male  History of Present Illness (Dan Martinezgarcia M. Nyxon Strupp MD; 04/24/2017 9:36 AM) The patient is a 56 year old male who presents with an inguinal hernia. He comes in today to rediscuss his massive left inguinal hernia. He is accompanied by his significant other. He has been seeing his primary care team and has been taking his blood pressure medication and his blood pressure is much better. When I initially met him back in June his blood pressure was massively uncontrolled. He denies any headaches or vision changes. He denies any nausea, vomiting, diarrhea or constipation. He denies any inguinal discomfort. He states that he does get a little bit winded with physical activity but denies shortness of breath at rest. He denies any chest pain, chest pressure, TIAs or amaurosis fugax. He denies any jaw pain or left upper extremity pain.   Problem List/Past Medical (Dan Ganus M. 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 Jalysa Swopes, MD; 04/24/2017 9:36 AM) No pertinent past surgical history  Diagnostic Studies History (Dan Mogg M. Alieah Brinton, MD; 04/24/2017 9:36 AM) Colonoscopy never  Allergies (Dan Salazar, Salazar; 04/24/2017 9:10 AM) HYDROCHLOROTHIAZIDE  Medication History (Dan Salazar, Salazar; 04/24/2017 9:10 AM) Lisinopril (40MG Tablet, Oral) Active. Medications Reconciled  Social History (Dan Montefusco M. Daemon Dowty, MD; 04/24/2017 9:36 AM) Alcohol use Occasional alcohol use. Caffeine use Carbonated beverages. Illicit drug use Recently quit drug use. Tobacco use Never smoker.  Family History (Dan Derstine M. Minda Faas, MD; 04/24/2017 9:36 AM) Breast Cancer Mother. Depression Daughter. Diabetes Mellitus Mother. Prostate Cancer Father.     Review of Systems (Dan Birdwell M. Jahsir Rama  MD; 04/24/2017 9:34 AM) General Not Present- Appetite Loss, Chills, Fatigue, Fever, Night Sweats, Weight Gain and Weight Loss. Skin Not Present- Change in Wart/Mole, Dryness, Hives, Jaundice, New Lesions, Non-Healing Wounds, Rash and Ulcer. HEENT Not Present- Earache, Hearing Loss, Hoarseness, Nose Bleed, Oral Ulcers, Ringing in the Ears, Seasonal Allergies, Sinus Pain, Sore Throat, Visual Disturbances, Wears glasses/contact lenses and Yellow Eyes. Respiratory Present- Difficulty Breathing and Snoring. Not Present- Bloody sputum, Chronic Cough and Wheezing. Breast Not Present- Breast Mass, Breast Pain, Nipple Discharge and Skin Changes. Cardiovascular Not Present- Chest Pain, Difficulty Breathing Lying Down, Leg Cramps, Palpitations, Rapid Heart Rate, Shortness of Breath and Swelling of Extremities. Gastrointestinal Present- Hemorrhoids. Not Present- Abdominal Pain, Bloating, Bloody Stool, Change in Bowel Habits, Chronic diarrhea, Constipation, Difficulty Swallowing, Excessive gas, Gets full quickly at meals, Indigestion, Nausea, Rectal Pain and Vomiting. Male Genitourinary Not Present- Blood in Urine, Change in Urinary Stream, Frequency, Impotence, Nocturia, Painful Urination, Urgency and Urine Leakage.  Vitals (Dan Salazar; 04/24/2017 9:10 AM) 04/24/2017 9:10 AM Weight: 182.6 lb Height: 74in Body Surface Area: 2.09 m Body Mass Index: 23.44 kg/m  Temp.: 98.2F  Pulse: 52 (Regular)  BP: 120/80 (Sitting, Left Arm, Standard)      Physical Exam (Karol Liendo M. Lonette Stevison MD; 04/24/2017 9:33 AM)  General Mental Status-Alert. General Appearance-Consistent with stated age. Hydration-Well hydrated. Voice-Normal.  Head and Neck Head-normocephalic, atraumatic with no lesions or palpable masses. Trachea-midline. Thyroid Gland Characteristics - normal size and consistency.  Eye Eyeball - Bilateral-Extraocular movements intact. Sclera/Conjunctiva - Bilateral-No  scleral icterus.  ENMT Note: ears - normal ext ears mouth - lips intact  Chest and Lung Exam Chest and lung exam reveals -quiet, even and easy respiratory effort with no use of accessory muscles and   on auscultation, normal breath sounds, no adventitious sounds and normal vocal resonance. Inspection Chest Wall - Normal. Back - normal.  Breast - Did not examine.  Cardiovascular Cardiovascular examination reveals -normal heart sounds, regular rate and rhythm with no murmurs and normal pedal pulses bilaterally.  Abdomen Inspection Inspection of the abdomen reveals - No Hernias. Skin - Scar - no surgical scars. Palpation/Percussion Palpation and Percussion of the abdomen reveal - Soft, Non Tender, No Rebound tenderness, No Rigidity (guarding) and No hepatosplenomegaly. Auscultation Auscultation of the abdomen reveals - Bowel sounds normal.  Male Genitourinary Note: Massive left inguinal bulge extends into scrotum., Soft, nontender, nonreducible  Peripheral Vascular Upper Extremity Palpation - Pulses bilaterally normal.  Neurologic Neurologic evaluation reveals -alert and oriented x 3 with no impairment of recent or remote memory. Mental Status-Normal.  Neuropsychiatric The patient's mood and affect are described as -normal. Judgment and Insight-insight is appropriate concerning matters relevant to self.  Musculoskeletal Normal Exam - Left-Upper Extremity Strength Normal and Lower Extremity Strength Normal. Normal Exam - Right-Upper Extremity Strength Normal and Lower Extremity Strength Normal.  Lymphatic Head & Neck  General Head & Neck Lymphatics: Bilateral - Description - Normal. Axillary - Did not examine. Femoral & Inguinal - Did not examine.    Assessment & Plan (Ezequias Lard M. Keeli Roberg MD; 04/24/2017 9:33 AM)  INCARCERATED LEFT INGUINAL HERNIA (K40.30) Impression: We discussed the etiology of inguinal hernias. We discussed the signs & symptoms of  incarceration & strangulation. We discussed non-operative and operative management.  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  We discussed diagnostic laparoscopy with open repair with mesh I described the procedure in detail. The patient was given educational material. We discussed the risks and benefits including but not limited to bleeding, infection, chronic inguinal pain, nerve entrapment, hernia recurrence, mesh complications, hematoma formation, urinary retention, injury to the testicle, numbness in the groin, blood clots, injury to the surrounding structures, and anesthesia risk. We also discussed the typical post operative recovery course, including no heavy lifting for 6 weeks. I explained that the likelihood of improvement of their symptoms is fair to good  I explained that he was at high risk for hematoma formation, recurrence, testicular loss, chronic numbness and/or pain in his groin due to the sheer size of his inguinal hernia  Current Plans Pt Education - Pamphlet Given - Hernia Surgery: discussed with patient and provided information. HYPERTENSION, ESSENTIAL (I10) Impression: It appears the patient has poor insight into his underlying medical history. He was unaware why he was taking the pills. Specifically he did not know he had a diagnosis of hypertension. He states that he ran out of the pills a few months ago. It sounded like they started a second agent in January for refractory hypertension namely HCTZ to his lisinopril. He ended up in [facility] with a left tried abnormalities in March probably due to diuretic usage. He currently is not taking any medication for hypertension. I explained that he would have to follow-up with his primary care team prior to scheduling surgery to get back on hypertension medication  Sinead Hockman M. Oland Arquette, MD, FACS General, Bariatric, & Minimally Invasive Surgery [facility] [place]   

## 2017-04-30 NOTE — Anesthesia Procedure Notes (Signed)
Anesthesia Regional Block: TAP block   Pre-Anesthetic Checklist: ,, timeout performed, Correct Patient, Correct Site, Correct Laterality, Correct Procedure, Correct Position, site marked, Risks and benefits discussed,  Surgical consent,  Pre-op evaluation,  At surgeon's request and post-op pain management  Laterality: Left  Prep: chloraprep       Needles:  Injection technique: Single-shot  Needle Type: Echogenic Needle     Needle Length: 9cm      Additional Needles:   Procedures:,,,, ultrasound used (permanent image in chart),,,,  Narrative:  Start time: 04/30/2017 10:24 AM End time: 04/30/2017 10:34 AM Injection made incrementally with aspirations every 5 mL.  Performed by: Personally  Anesthesiologist: Vinton Layson  Additional Notes: Patient tolerated the procedure well without complications

## 2017-04-30 NOTE — Progress Notes (Signed)
Assisted Dr. Rose with left, ultrasound guided, transabdominal plane block. Side rails up, monitors on throughout procedure. See vital signs in flow sheet. Tolerated Procedure well.  

## 2017-04-30 NOTE — Anesthesia Procedure Notes (Signed)
Anesthesia Procedure Image    

## 2017-04-30 NOTE — Interval H&P Note (Signed)
History and Physical Interval Note:  04/30/2017 10:23 AM  Dan Salazar  has presented today for surgery, with the diagnosis of MASSIVE LEFT INGUINAL HERNIA   The various methods of treatment have been discussed with the patient and family. After consideration of risks, benefits and other options for treatment, the patient has consented to  Procedure(s): LAPAROSCOPY DIAGNOSTIC (N/A) OPEN REPAIR MASSIVE INCARCERATED LEFT INGUINAL HERNIA REPAIR WITH MESH (Left) ORCHIECTOMY (N/A) INSERTION OF MESH (Left) as a surgical intervention .  The patient's history has been reviewed, patient examined, no change in status, stable for surgery.  I have reviewed the patient's chart and labs.  Questions were answered to the patient's satisfaction.    Leighton Ruff. [name], MD, [facility], Bariatric, & Minimally Invasive Surgery [facility], [place]  [facility]

## 2017-04-30 NOTE — Anesthesia Preprocedure Evaluation (Signed)
Anesthesia Evaluation  Patient identified by MRN, date of birth, ID band Patient awake    Reviewed: Allergy & Precautions, NPO status , Patient's Chart, lab work & pertinent test results  Airway Mallampati: II  TM Distance: >3 FB Neck ROM: Full    Dental no notable dental hx.    Pulmonary neg pulmonary ROS,    Pulmonary exam normal breath sounds clear to auscultation       Cardiovascular hypertension, Normal cardiovascular exam Rhythm:Regular Rate:Normal     Neuro/Psych negative neurological ROS  negative psych ROS   GI/Hepatic negative GI ROS, Neg liver ROS,   Endo/Other  negative endocrine ROS  Renal/GU negative Renal ROS  negative genitourinary   Musculoskeletal negative musculoskeletal ROS (+)   Abdominal   Peds negative pediatric ROS (+)  Hematology negative hematology ROS (+)   Anesthesia Other Findings   Reproductive/Obstetrics negative OB ROS                             Anesthesia Physical Anesthesia Plan  ASA: II  Anesthesia Plan: General   Post-op Pain Management:    Induction: Intravenous  PONV Risk Score and Plan: 2 and Ondansetron and Dexamethasone  Airway Management Planned: Oral ETT  Additional Equipment:   Intra-op Plan:   Post-operative Plan: Extubation in OR  Informed Consent: I have reviewed the patients History and Physical, chart, labs and discussed the procedure including the risks, benefits and alternatives for the proposed anesthesia with the patient or authorized representative who has indicated his/her understanding and acceptance.   Dental advisory given  Plan Discussed with: CRNA  Anesthesia Plan Comments:         Anesthesia Quick Evaluation

## 2017-04-30 NOTE — Anesthesia Procedure Notes (Signed)
Procedure Name: Intubation Date/Time: 04/30/2017 11:09 AM Performed by: Carleene Cooper A Pre-anesthesia Checklist: Patient identified, Emergency Drugs available, Suction available, Patient being monitored and Timeout performed Patient Re-evaluated:Patient Re-evaluated prior to induction Oxygen Delivery Method: Circle system utilized Preoxygenation: Pre-oxygenation with 100% oxygen Induction Type: IV induction Ventilation: Mask ventilation without difficulty Laryngoscope Size: Mac and 4 Grade View: Grade I Tube type: Oral Tube size: 7.5 mm Number of attempts: 1 Airway Equipment and Method: Stylet Placement Confirmation: ETT inserted through vocal cords under direct vision,  positive ETCO2 and breath sounds checked- equal and bilateral Secured at: 22 cm Tube secured with: Tape Dental Injury: Teeth and Oropharynx as per pre-operative assessment

## 2017-04-30 NOTE — Transfer of Care (Signed)
Immediate Anesthesia Transfer of Care Note  Patient: Dan Salazar  Procedure(s) Performed: Procedure(s): LAPAROSCOPY DIAGNOSTIC (N/A) OPEN REPAIR MASSIVE INCARCERATED LEFT INGUINAL HERNIA REPAIR WITH MESH (Left) INSERTION OF MESH (Left)  Patient Location: PACU  Anesthesia Type:General  Level of Consciousness:  sedated, patient cooperative and responds to stimulation  Airway & Oxygen Therapy:Patient Spontanous Breathing and Patient connected to face mask oxgen  Post-op Assessment:  Report given to PACU RN and Post -op Vital signs reviewed and stable  Post vital signs:  Reviewed and stable  Last Vitals:  Vitals:   04/30/17 1042 04/30/17 1047  BP: 96/70 97/66  Pulse: (!) 44 (!) 45  Resp: 10 11  SpO2: 347% 583%    Complications: No apparent anesthesia complications

## 2017-05-01 LAB — BASIC METABOLIC PANEL
ANION GAP: 7 (ref 5–15)
BUN: 23 mg/dL — AB (ref 6–20)
CO2: 28 mmol/L (ref 22–32)
Calcium: 8.3 mg/dL — ABNORMAL LOW (ref 8.9–10.3)
Chloride: 101 mmol/L (ref 101–111)
Creatinine, Ser: 1.31 mg/dL — ABNORMAL HIGH (ref 0.61–1.24)
GFR calc Af Amer: >60 mL/min (ref >60)
GFR calc non Af Amer: 58 mL/min — ABNORMAL LOW (ref >60)
GLUCOSE: 112 mg/dL — AB (ref 65–99)
POTASSIUM: 4.9 mmol/L (ref 3.5–5.1)
Sodium: 136 mmol/L (ref 135–145)

## 2017-05-01 LAB — CBC
HEMATOCRIT: 37.7 % — AB (ref 39.0–52.0)
Hemoglobin: 12.7 g/dL — ABNORMAL LOW (ref 13.0–17.0)
MCH: 32.4 pg (ref 26.0–34.0)
MCHC: 33.7 g/dL (ref 30.0–36.0)
MCV: 96.2 fL (ref 78.0–100.0)
PLATELETS: 187 10*3/uL (ref 150–400)
RBC: 3.92 MIL/uL — AB (ref 4.22–5.81)
RDW: 14.1 % (ref 11.5–15.5)
WBC: 12.1 10*3/uL — AB (ref 4.0–10.5)

## 2017-05-01 MED ORDER — TRAMADOL HCL 50 MG PO TABS: 50.0000 mg | ORAL_TABLET | Freq: Four times a day (QID) | ORAL | 0 refills | Status: DC | PRN

## 2017-05-01 NOTE — Progress Notes (Signed)
Pt was bladder scanned, 103 cc of urine was measured.

## 2017-05-01 NOTE — Discharge Instructions (Signed)
DRINK PLENTY OF LIQUIDS!!!!!!!!  [facility]  UMBILICAL OR INGUINAL HERNIA REPAIR: POST OP INSTRUCTIONS  Always review your discharge instruction sheet given to you by the facility where your surgery was performed. IF YOU HAVE DISABILITY OR FAMILY LEAVE FORMS, YOU MUST BRING THEM TO THE OFFICE FOR PROCESSING.   DO NOT GIVE THEM TO YOUR DOCTOR.  1. A  prescription for pain medication may be given to you upon discharge.   you may take acetaminophen (Tylenol) and/or ibuprofen (Advil) as needed for pain. If you still have pain after taking tylenol and ibuprofen then you may take your narcotic pain medication (ultram) as prescribed, if needed.  2. Take your usually prescribed medications unless otherwise directed. 3. If you need a refill on your pain medication, please contact your pharmacy.  They will contact our office to request authorization. Prescriptions will not be filled after 5 pm or on week-ends. 4. You should follow a light diet the first 24 hours after arrival home, such as soup and crackers, etc.  Be sure to include lots of fluids daily.  Resume your normal diet the day after surgery. 5. Most patients will experience some swelling and bruising around the umbilicus or in the groin and scrotum.  Ice packs and reclining will help.  Swelling and bruising can take several days to resolve.  6. It is common to experience some constipation if taking pain medication after surgery.  Increasing fluid intake and taking a stool softener (such as Colace) will usually help or prevent this problem from occurring.  A mild laxative (Milk of Magnesia or Miralax) should be taken according to package directions if there are no bowel movements after 48 hours. 7.  If your surgeon used skin glue on the incision, you may shower in 24 hours.  The glue will flake off over the next 2-3 weeks.  Any sutures or staples will be removed at the office during your follow-up visit. 8. ACTIVITIES:  You may  resume regular (light) daily activities beginning the next day--such as daily self-care, walking, climbing stairs--gradually increasing activities as tolerated.  You may have sexual intercourse when it is comfortable.  Refrain from any heavy lifting or straining until approved by your doctor. a. You may drive when you are no longer taking prescription pain medication, you can comfortably wear a seatbelt, and you can safely maneuver your car and apply brakes. b. RETURN TO WORK:  9. You should see your doctor in the office for a follow-up appointment approximately 2-3 weeks after your surgery.  Make sure that you call for this appointment within a day or two after you arrive home to insure a convenient appointment time. 10. OTHER INSTRUCTIONS:     WHEN TO CALL YOUR DOCTOR: 1. Fever over 101.0 2. Inability to urinate 3. Nausea and/or vomiting 4. Extreme swelling or bruising 5. Continued bleeding from incision. 6. Increased pain, redness, or drainage from the incision  The clinic staff is available to answer your questions during regular business hours.  Please dont hesitate to call and ask to speak to one of the nurses for clinical concerns.  If you have a medical emergency, go to the nearest emergency room or call 911.  A surgeon from [facility] is always on call at the hospital   [address], [place], [place], [place]  [address] ?  [address], [place], [place]   [address] [phone] ? [phone] ? FAX [phone] Web site: [email or web address]

## 2017-05-01 NOTE — Progress Notes (Signed)
IV was removed. Pt was given discharge instructions and prescription. He was taken out in a wheelchair by nurse.His friend took him home.

## 2017-05-01 NOTE — Discharge Summary (Signed)
Physician Discharge Summary  Dan Salazar RDE:[id number] DOB: 1960-08-02 DOA: 04/30/2017  PCP: Patient, No Pcp Per  Admit date: 04/30/2017 Discharge date: 05/01/2017  Recommendations for Outpatient Follow-up:   Follow-up Information    Greer Pickerel, MD. Go on 06/05/2017.   Specialty:  General Surgery Why:  at 11:15 AM; please arrive at 11 AM Contact information: [place] [address] [phone]          Discharge Diagnoses:  1. Incarcerated left inguinal hernia 2. hypertension  Surgical Procedure:  Diagnostic laparoscopy; open repair of massive incarcerated left direct inguinal hernia with mesh  Discharge Condition: good Disposition: home  Diet recommendation: cardiac  Filed Weights   04/30/17 0851  Weight: 82.6 kg (182 lb)    History of present illness:  The patient is a 56 year old male who presents with an inguinal hernia. He comes in today to rediscuss his massive left inguinal hernia. He is accompanied by his significant other. He has been seeing his primary care team and has been taking his blood pressure medication and his blood pressure is much better. When I initially met him back in June his blood pressure was massively uncontrolled. He denies any headaches or vision changes. He denies any nausea, vomiting, diarrhea or constipation. He denies any inguinal discomfort. He states that he does get a little bit winded with physical activity but denies shortness of breath at rest. He denies any chest pain, chest pressure, TIAs or amaurosis fugax. He denies any jaw pain or left upper extremity pain.  Hospital Course:  He was kept overnight for observation for pain control, bleeding, and to make sure voided spontaneously. On pod 1 he was doing well, ambulating in halls, minimal pain, voided several times, post void residual was 100, tolerating a diet. We discussed dc instructions.   BP 136/71 (BP Location: Left Arm)   Pulse (!) 52   Temp  97.7 F (36.5 C) (Axillary)   Resp 18   Ht '6\' 2"'$  (1.88 m)   Wt 82.6 kg (182 lb)   SpO2 100%   BMI 23.37 kg/m   Gen: alert, NAD, non-toxic appearing Pupils: equal, no scleral icterus Pulm: Lungs clear to auscultation, symmetric chest rise CV: regular rate and rhythm Abd: soft, nontender, nondistended. . No cellulitis. No incisional hernia GU- redundant scrotal skin but no signif swelling or evidence of hematoma Ext: no edema, no calf tenderness Skin: no rash, no jaundice    Discharge Instructions  Discharge Instructions    Call MD for:    Complete by:  As directed    Temperature >101   Call MD for:  hives    Complete by:  As directed    Call MD for:  persistant dizziness or light-headedness    Complete by:  As directed    Call MD for:  persistant nausea and vomiting    Complete by:  As directed    Call MD for:  redness, tenderness, or signs of infection (pain, swelling, redness, odor or green/yellow discharge around incision site)    Complete by:  As directed    Call MD for:  severe uncontrolled pain    Complete by:  As directed    Diet - low sodium heart healthy    Complete by:  As directed    Discharge instructions    Complete by:  As directed    See [facility] discharge instructions   Increase activity slowly    Complete by:  As directed  Allergies as of 05/01/2017      Reactions   Hctz [hydrochlorothiazide] Other (See Comments)   Severe electrolyte abnormalities      Medication List    TAKE these medications   lisinopril 40 MG tablet Commonly known as:  PRINIVIL,ZESTRIL Take 40 mg by mouth daily.   traMADol 50 MG tablet Commonly known as:  ULTRAM Take 1 tablet (50 mg total) by mouth every 6 (six) hours as needed (mild pain).      Follow-up Information    Greer Pickerel, MD. Go on 06/05/2017.   Specialty:  General Surgery Why:  at 11:15 AM; please arrive at 11 AM Contact information: [place] [address] [phone]             The results of significant diagnostics from this hospitalization (including imaging, microbiology, ancillary and laboratory) are listed below for reference.    Significant Diagnostic Studies: No results found.  Microbiology: No results found for this or any previous visit (from the past 240 hour(s)).   Labs: Basic Metabolic Panel:  Recent Labs Lab 04/29/17 0827 05/01/17 0531  NA 136 136  K 4.1 4.9  CL 101 101  CO2 28 28  GLUCOSE 99 112*  BUN 16 23*  CREATININE 1.24 1.31*  CALCIUM 8.8* 8.3*   Liver Function Tests:  Recent Labs Lab 04/29/17 0827  AST 23  ALT 28  ALKPHOS 68  BILITOT 0.8  PROT 7.0  ALBUMIN 3.8   No results for input(s): LIPASE, AMYLASE in the last 168 hours. No results for input(s): AMMONIA in the last 168 hours. CBC:  Recent Labs Lab 04/29/17 0827 05/01/17 0531  WBC 7.7 12.1*  NEUTROABS 6.0  --   HGB 15.6 12.7*  HCT 45.0 37.7*  MCV 93.4 96.2  PLT 199 187   Cardiac Enzymes: No results for input(s): CKTOTAL, CKMB, CKMBINDEX, TROPONINI in the last 168 hours. BNP: BNP (last 3 results)  Recent Labs  09/28/16 0939  BNP 16.4    ProBNP (last 3 results) No results for input(s): PROBNP in the last 8760 hours.  CBG: No results for input(s): GLUCAP in the last 168 hours.  Active Problems:   Incarcerated inguinal hernia   Time coordinating discharge: 15 min  Signed:  Gayland Curry, MD [facility] [place] 05/01/2017, 8:36 AM

## 2017-05-02 NOTE — Anesthesia Postprocedure Evaluation (Signed)
Anesthesia Post Note  Patient: Programmer, systems  Procedure(s) Performed: LAPAROSCOPY DIAGNOSTIC (N/A Abdomen) OPEN REPAIR MASSIVE INCARCERATED LEFT INGUINAL HERNIA REPAIR WITH MESH (Left Groin) INSERTION OF MESH (Left Groin)     Patient location during evaluation: PACU Anesthesia Type: General Level of consciousness: awake and alert Pain management: pain level controlled Vital Signs Assessment: post-procedure vital signs reviewed and stable Respiratory status: spontaneous breathing, nonlabored ventilation, respiratory function stable and patient connected to nasal cannula oxygen Cardiovascular status: blood pressure returned to baseline and stable Postop Assessment: no apparent nausea or vomiting Anesthetic complications: no    Last Vitals:  Vitals:   05/01/17 0149 05/01/17 0541  BP: 109/77 136/71  Pulse: (!) 56 (!) 52  Resp: 18 18  Temp: 36.5 C 36.5 C  SpO2: 98% 100%    Last Pain:  Vitals:   05/01/17 0638  TempSrc:   PainSc: 0-No pain                 Ludmilla Mcgillis S

## 2017-10-31 ENCOUNTER — Other Ambulatory Visit: Payer: Self-pay

## 2017-10-31 ENCOUNTER — Inpatient Hospital Stay (HOSPITAL_COMMUNITY)
Admission: EM | Admit: 2017-10-31 | Discharge: 2017-11-14 | DRG: 853 | Disposition: A | Discharge status: Home Health | Payer: Self-pay | Attending: Internal Medicine | Admitting: Internal Medicine

## 2017-10-31 ENCOUNTER — Encounter (HOSPITAL_COMMUNITY): Payer: Self-pay

## 2017-10-31 ENCOUNTER — Emergency Department (HOSPITAL_COMMUNITY): Payer: Self-pay

## 2017-10-31 ENCOUNTER — Inpatient Hospital Stay (HOSPITAL_COMMUNITY): Payer: Self-pay

## 2017-10-31 DIAGNOSIS — J189 Pneumonia, unspecified organism: Secondary | ICD-10-CM

## 2017-10-31 DIAGNOSIS — D6489 Other specified anemias: Secondary | ICD-10-CM | POA: Diagnosis present

## 2017-10-31 DIAGNOSIS — I130 Hypertensive heart and chronic kidney disease with heart failure and stage 1 through stage 4 chronic kidney disease, or unspecified chronic kidney disease: Secondary | ICD-10-CM | POA: Diagnosis present

## 2017-10-31 DIAGNOSIS — J869 Pyothorax without fistula: Secondary | ICD-10-CM | POA: Diagnosis present

## 2017-10-31 DIAGNOSIS — J9819 Other pulmonary collapse: Secondary | ICD-10-CM | POA: Diagnosis present

## 2017-10-31 DIAGNOSIS — T17990A Other foreign object in respiratory tract, part unspecified in causing asphyxiation, initial encounter: Secondary | ICD-10-CM | POA: Diagnosis present

## 2017-10-31 DIAGNOSIS — I5032 Chronic diastolic (congestive) heart failure: Secondary | ICD-10-CM | POA: Diagnosis present

## 2017-10-31 DIAGNOSIS — R6521 Severe sepsis with septic shock: Secondary | ICD-10-CM | POA: Diagnosis present

## 2017-10-31 DIAGNOSIS — E871 Hypo-osmolality and hyponatremia: Secondary | ICD-10-CM | POA: Diagnosis present

## 2017-10-31 DIAGNOSIS — J9601 Acute respiratory failure with hypoxia: Secondary | ICD-10-CM

## 2017-10-31 DIAGNOSIS — Z79899 Other long term (current) drug therapy: Secondary | ICD-10-CM

## 2017-10-31 DIAGNOSIS — Z01818 Encounter for other preprocedural examination: Secondary | ICD-10-CM

## 2017-10-31 DIAGNOSIS — Z781 Physical restraint status: Secondary | ICD-10-CM

## 2017-10-31 DIAGNOSIS — E2740 Unspecified adrenocortical insufficiency: Secondary | ICD-10-CM | POA: Diagnosis present

## 2017-10-31 DIAGNOSIS — J90 Pleural effusion, not elsewhere classified: Secondary | ICD-10-CM

## 2017-10-31 DIAGNOSIS — G9340 Encephalopathy, unspecified: Secondary | ICD-10-CM | POA: Diagnosis present

## 2017-10-31 DIAGNOSIS — Z9689 Presence of other specified functional implants: Secondary | ICD-10-CM

## 2017-10-31 DIAGNOSIS — E876 Hypokalemia: Secondary | ICD-10-CM | POA: Diagnosis not present

## 2017-10-31 DIAGNOSIS — R000 Tachycardia, unspecified: Secondary | ICD-10-CM

## 2017-10-31 DIAGNOSIS — J690 Pneumonitis due to inhalation of food and vomit: Secondary | ICD-10-CM | POA: Diagnosis present

## 2017-10-31 DIAGNOSIS — J9811 Atelectasis: Secondary | ICD-10-CM

## 2017-10-31 DIAGNOSIS — A419 Sepsis, unspecified organism: Principal | ICD-10-CM | POA: Diagnosis present

## 2017-10-31 DIAGNOSIS — D631 Anemia in chronic kidney disease: Secondary | ICD-10-CM | POA: Diagnosis present

## 2017-10-31 DIAGNOSIS — N182 Chronic kidney disease, stage 2 (mild): Secondary | ICD-10-CM | POA: Diagnosis present

## 2017-10-31 DIAGNOSIS — J939 Pneumothorax, unspecified: Secondary | ICD-10-CM

## 2017-10-31 DIAGNOSIS — Z789 Other specified health status: Secondary | ICD-10-CM

## 2017-10-31 DIAGNOSIS — R1310 Dysphagia, unspecified: Secondary | ICD-10-CM | POA: Diagnosis present

## 2017-10-31 DIAGNOSIS — R197 Diarrhea, unspecified: Secondary | ICD-10-CM | POA: Diagnosis not present

## 2017-10-31 LAB — BLOOD GAS, VENOUS
ACID-BASE EXCESS: 2.3 mmol/L — AB (ref 0.0–2.0)
BICARBONATE: 27 mmol/L (ref 20.0–28.0)
Drawn by: 295031
O2 CONTENT: 3 L/min
O2 Saturation: 36.3 %
PATIENT TEMPERATURE: 98.6
pCO2, Ven: 44.6 mmHg (ref 44.0–60.0)
pH, Ven: 7.399 (ref 7.250–7.430)

## 2017-10-31 LAB — BASIC METABOLIC PANEL
Anion gap: 11 (ref 5–15)
BUN: 18 mg/dL (ref 6–20)
CALCIUM: 8.8 mg/dL — AB (ref 8.9–10.3)
CHLORIDE: 95 mmol/L — AB (ref 101–111)
CO2: 27 mmol/L (ref 22–32)
CREATININE: 1.21 mg/dL (ref 0.61–1.24)
GFR calc Af Amer: >60 mL/min (ref >60)
Glucose, Bld: 126 mg/dL — ABNORMAL HIGH (ref 65–99)
Potassium: 4.6 mmol/L (ref 3.5–5.1)
SODIUM: 133 mmol/L — AB (ref 135–145)

## 2017-10-31 LAB — I-STAT TROPONIN, ED: Troponin i, poc: 0 ng/mL (ref 0.00–0.08)

## 2017-10-31 LAB — CBC WITH DIFFERENTIAL/PLATELET
BASOS PCT: 0 %
Basophils Absolute: 0 10*3/uL (ref 0.0–0.1)
EOS ABS: 0 10*3/uL (ref 0.0–0.7)
EOS PCT: 0 %
HCT: 37.5 % — ABNORMAL LOW (ref 39.0–52.0)
Hemoglobin: 12.6 g/dL — ABNORMAL LOW (ref 13.0–17.0)
LYMPHS ABS: 0.9 10*3/uL (ref 0.7–4.0)
Lymphocytes Relative: 4 %
MCH: 31.7 pg (ref 26.0–34.0)
MCHC: 33.6 g/dL (ref 30.0–36.0)
MCV: 94.2 fL (ref 78.0–100.0)
MONOS PCT: 4 %
Monocytes Absolute: 1 10*3/uL (ref 0.1–1.0)
Neutro Abs: 22 10*3/uL — ABNORMAL HIGH (ref 1.7–7.7)
Neutrophils Relative %: 92 %
PLATELETS: 363 10*3/uL (ref 150–400)
RBC: 3.98 MIL/uL — ABNORMAL LOW (ref 4.22–5.81)
RDW: 13.6 % (ref 11.5–15.5)
WBC: 23.9 10*3/uL — ABNORMAL HIGH (ref 4.0–10.5)

## 2017-10-31 IMAGING — CT CT ANGIO CHEST
2 of 6 series · 18 of 36 positions shown · IV contrast (ISOVUE)
Comparison: None.

CLINICAL DATA: Hypertension and dyspnea for 4 weeks. Cough.
Shortness of breath.

EXAM:
CT ANGIOGRAPHY CHEST WITH CONTRAST
TECHNIQUE: Multidetector CT imaging of the chest was performed using the
standard protocol during bolus administration of intravenous
contrast. Multiplanar CT image reconstructions and MIPs were
obtained to evaluate the vascular anatomy.
CONTRAST:  100mL [Z7] IOPAMIDOL ([Z7]) INJECTION 76%

[Series 5: thins · axial · 0.83mm/px · z∈[-672,-384]mm · 17 of 320 slices shown]
[im 16/320  lung]
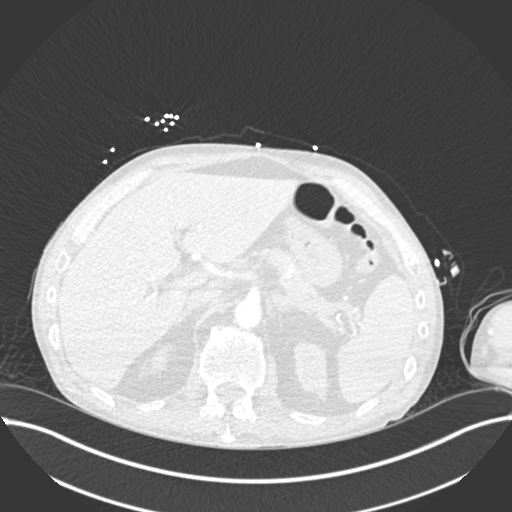
[im 32/320  mediastinal]
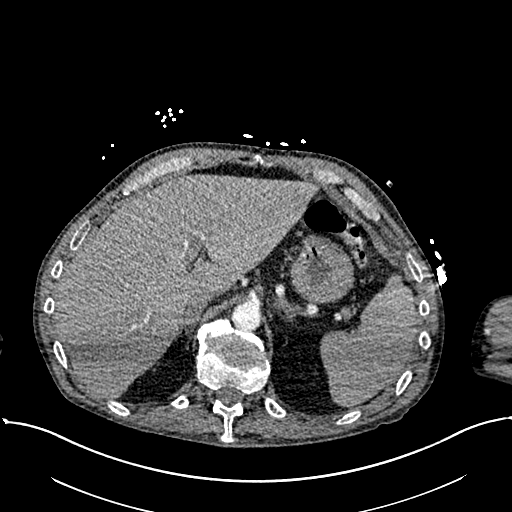
[im 48/320  lung]
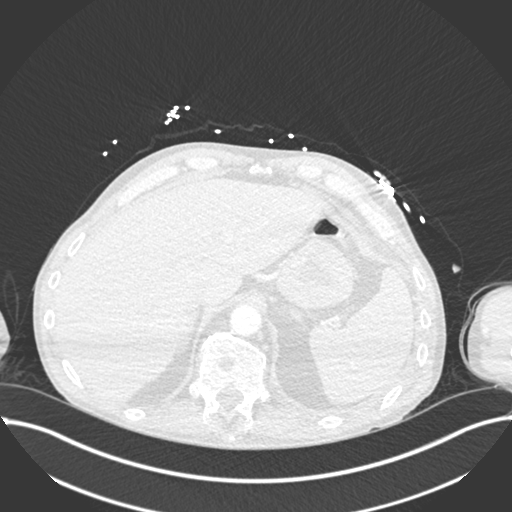
[im 64/320  mediastinal]
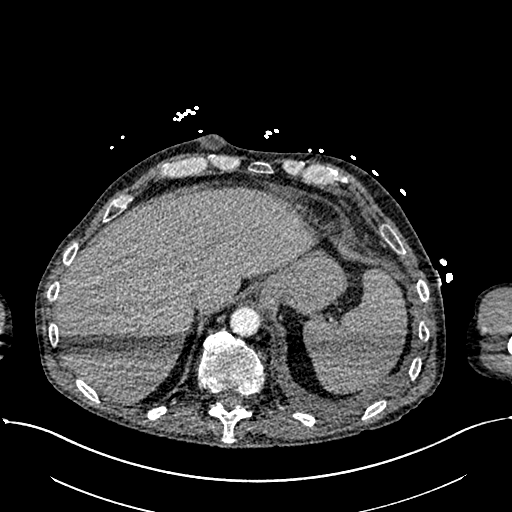
[im 96/320  lung]
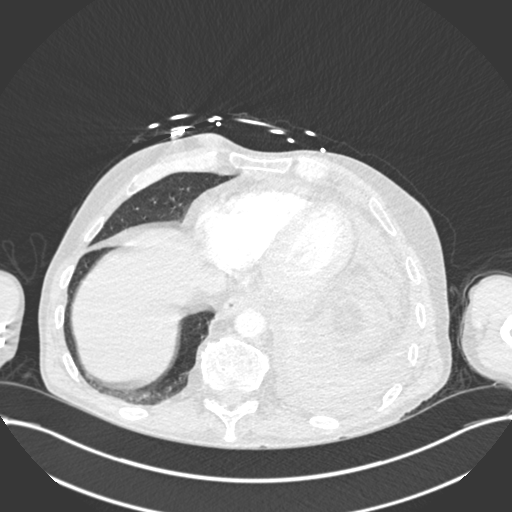
[im 112/320  mediastinal]
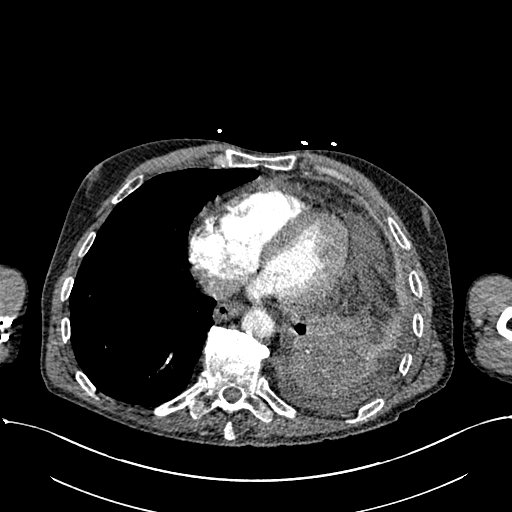
[im 128/320  lung]
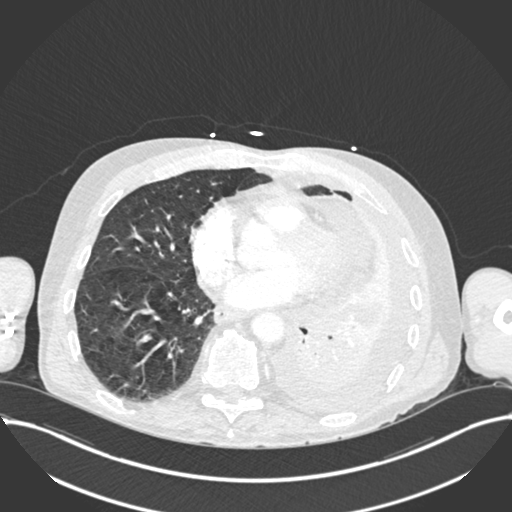
[im 144/320  mediastinal]
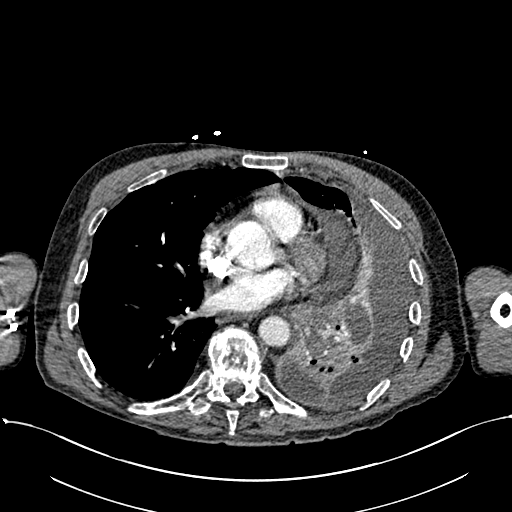
[im 160/320  lung]
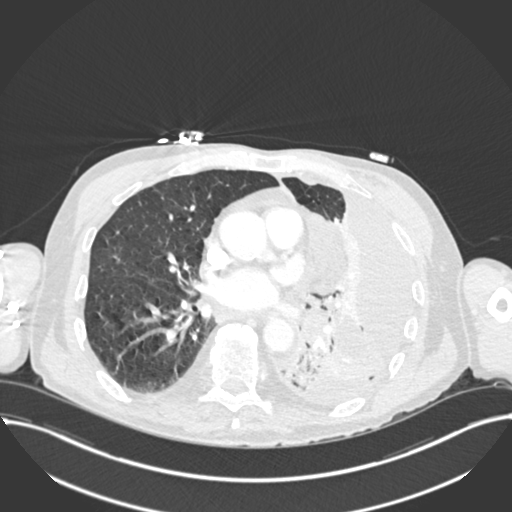
[im 176/320  mediastinal]
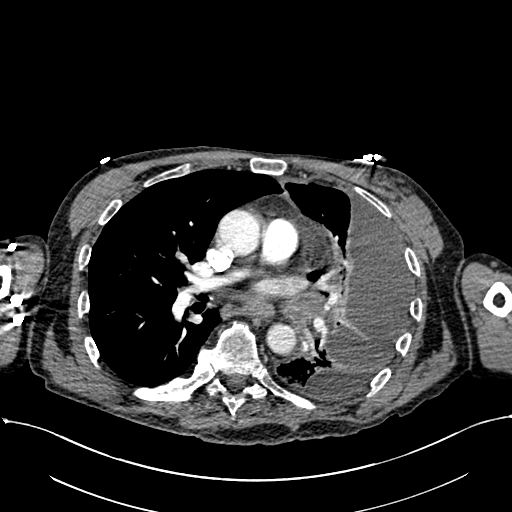
[im 192/320  lung]
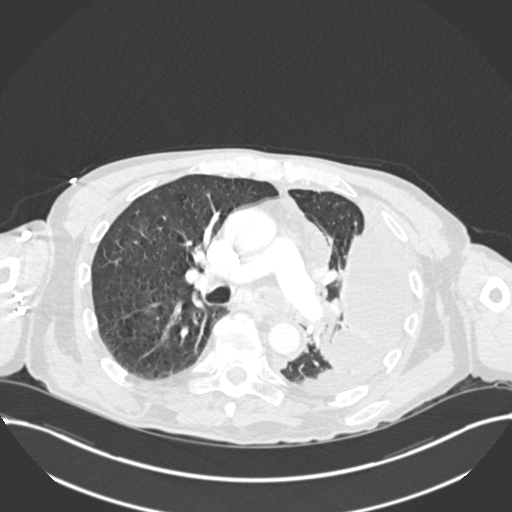
[im 208/320  mediastinal]
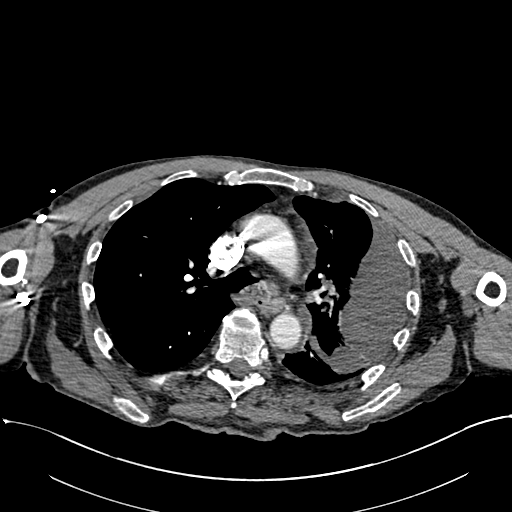
[im 224/320  lung]
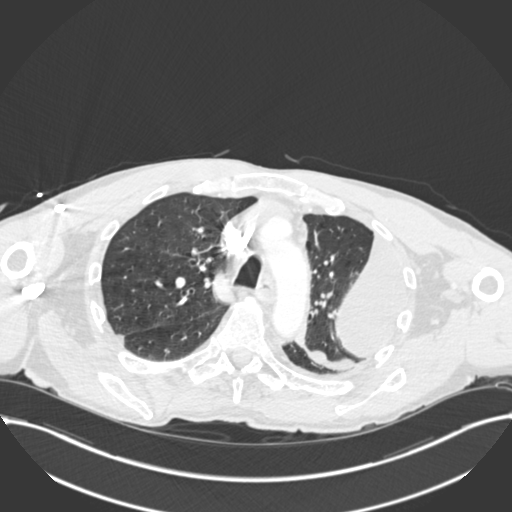
[im 256/320  mediastinal]
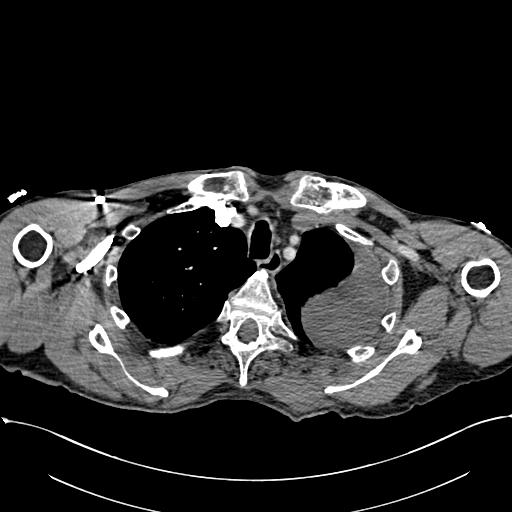
[im 272/320  lung]
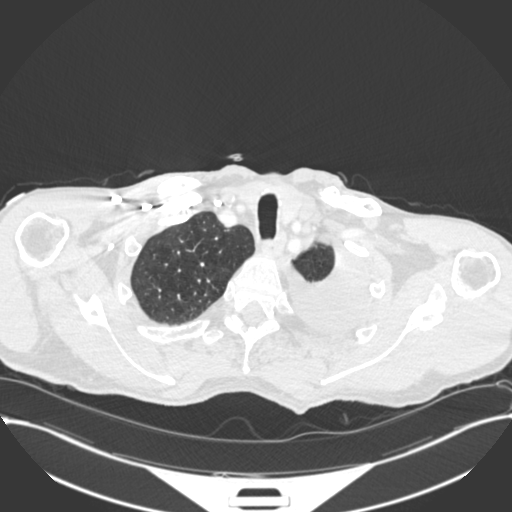
[im 288/320  mediastinal]
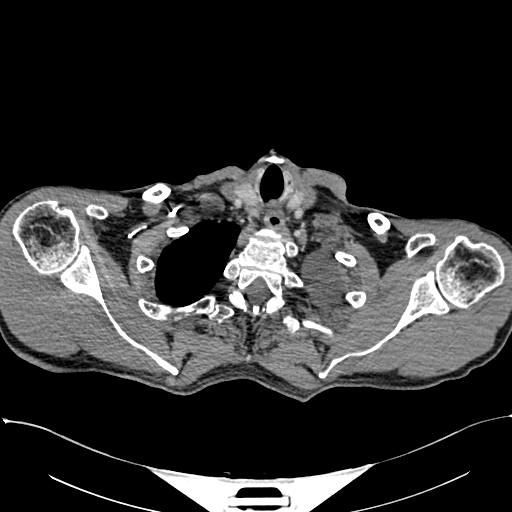
[im 304/320  lung]
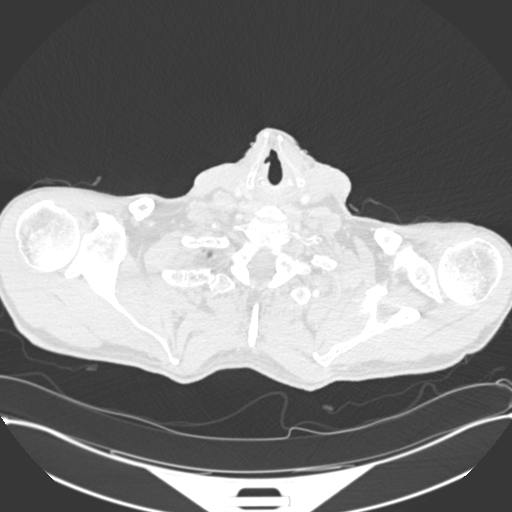

[Series 7: coronal mpr · coronal · 0.64mm/px · 1 of 150 slices shown]
[im 75/150  mediastinal]
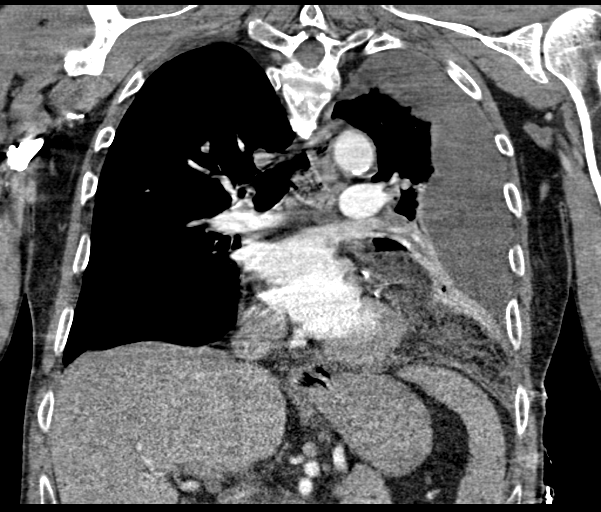

[18 of 36 positions shown; findings below may reference images not displayed]

FINDINGS: Cardiovascular: Good opacification of the central and segmental
pulmonary arteries. No focal filling defects. No evidence of
significant pulmonary embolus. Normal caliber thoracic aorta with
scattered calcifications. Great vessel origins are patent. Normal
heart size with small to moderate pericardial effusion. Coronary
artery calcifications.

Mediastinum/Nodes: Esophagus is decompressed. Small esophageal
hiatal hernia. No significant lymphadenopathy in the chest.

Lungs/Pleura: There is a large amount of fluid and debris at the
distal trachea involving the origin of the left mainstem bronchus
with near complete filling of the left mainstem and central bronchi.
This likely represents aspiration although mucous impaction or
secretions could also possibly have this appearance. Obstructing
endobronchial lesion could also be present. There is volume loss and
consolidation of the left lower lung. Large left pleural effusion
with possible loculation. Right lung is clear and expanded. No
significant fluid or secretions on the right. No pneumothorax.

Upper Abdomen: No acute abnormality.

Musculoskeletal: Degenerative changes in the spine. No destructive
bone lesions.

Review of the MIP images confirms the above findings.
IMPRESSION: 1. No evidence of significant pulmonary embolus.
2. Large amount of fluid and debris filling the left mainstem
bronchus and extending into the left central bronchi. This is likely
due to aspiration. Mucous secretions or obstructing endobronchial
lesion are other possibilities.
3. Consolidation and volume loss in the left lower lung, likely
infectious or postobstructive.
4. Large left pleural effusion with possible loculation.
5. Pericardial effusion.

Aortic Atherosclerosis ([Z7]-[Z7]).

## 2017-10-31 IMAGING — CT CT HEAD W/O CM
4 series · 17 of 47 positions shown, 19 images · non-contrast
Comparison: None.

CLINICAL DATA: Altered mental status.

EXAM:
CT HEAD WITHOUT CONTRAST
TECHNIQUE: Contiguous axial images were obtained from the base of the skull
through the vertex without intravenous contrast.

[Series 2: head wo · axial · 0.47mm/px · z∈[-256,-136]mm · 7 of 33 slices shown, 9 images]
[im 5/33  brain]
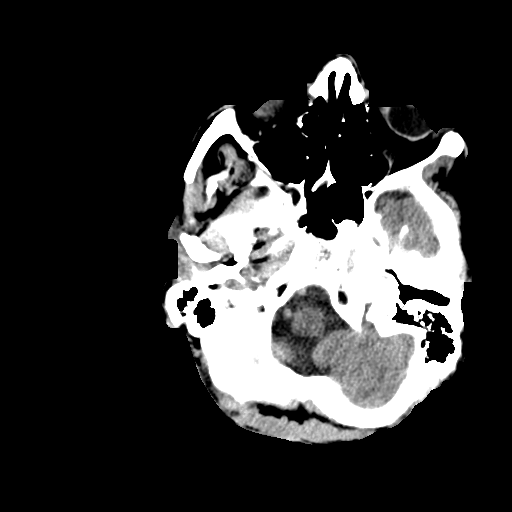
[im 5/33  bone]
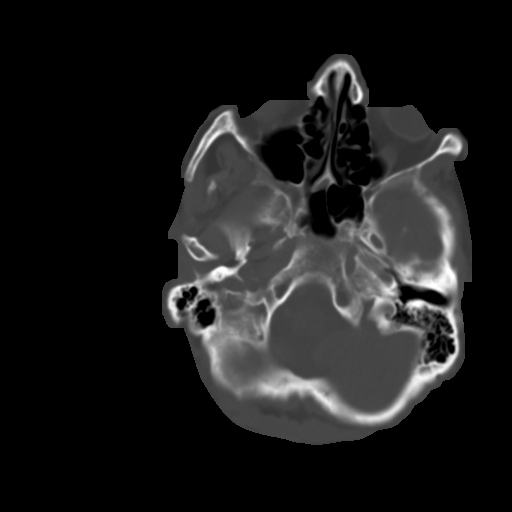
[im 9/33  brain]
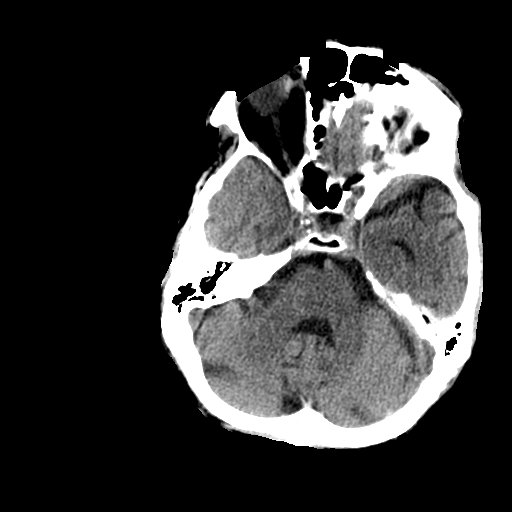
[im 13/33  brain]
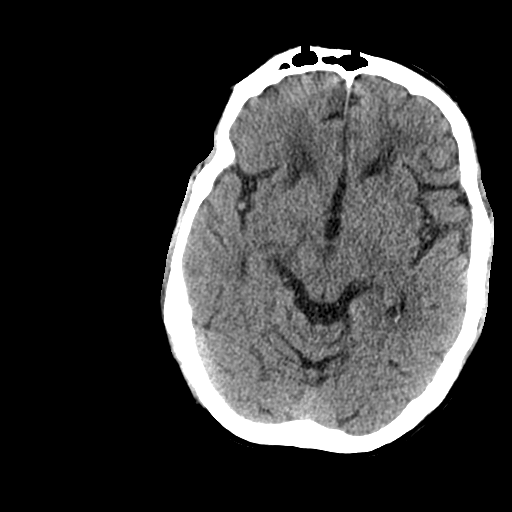
[im 17/33  brain]
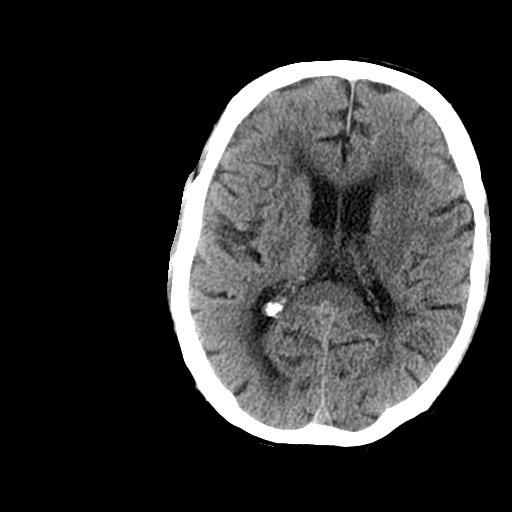
[im 21/33  brain]
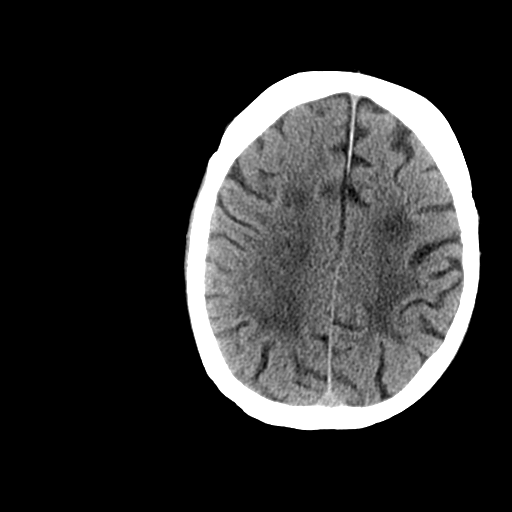
[im 21/33  bone]
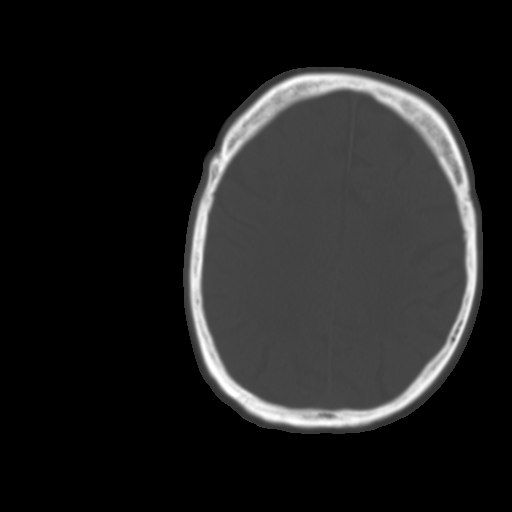
[im 25/33  brain]
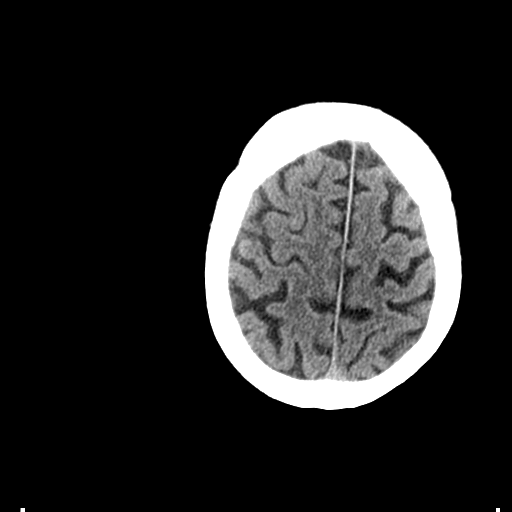
[im 29/33  brain]
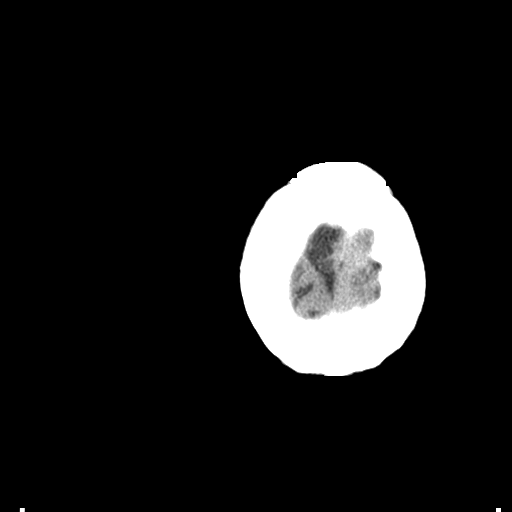

[Series 3: head bone · axial · 0.47mm/px · z∈[-260,-204]mm · 4 of 83 slices shown]
[im 9/83  bone]
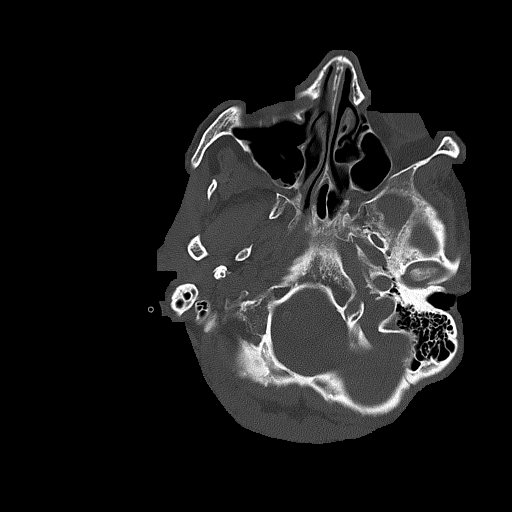
[im 17/83  bone]
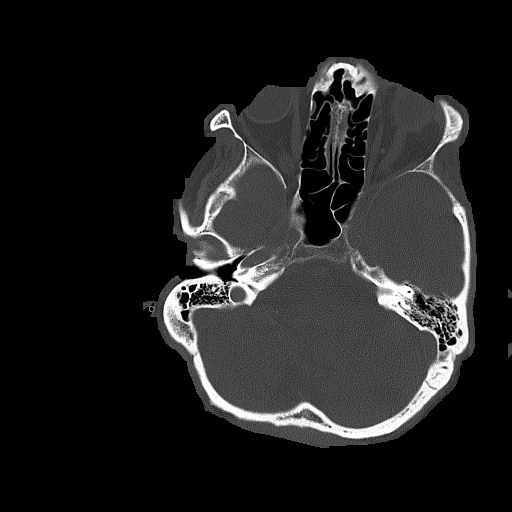
[im 25/83  bone]
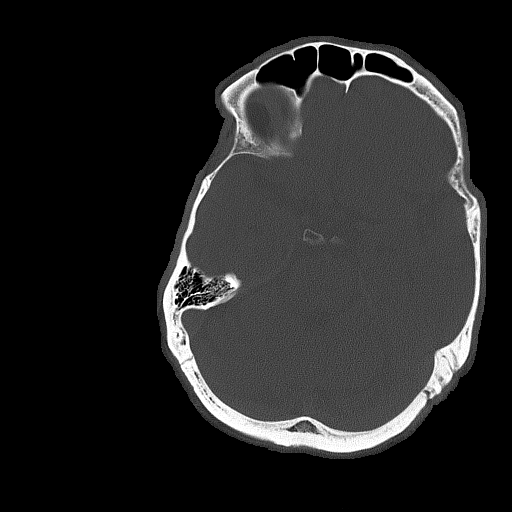
[im 37/83  bone]
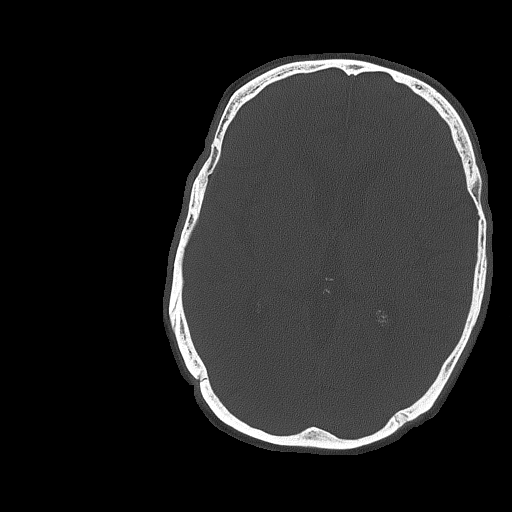

[Series 4: coronal soft tissue · coronal · 0.32mm/px · 3 of 68 slices shown]
[im 23/68  brain]
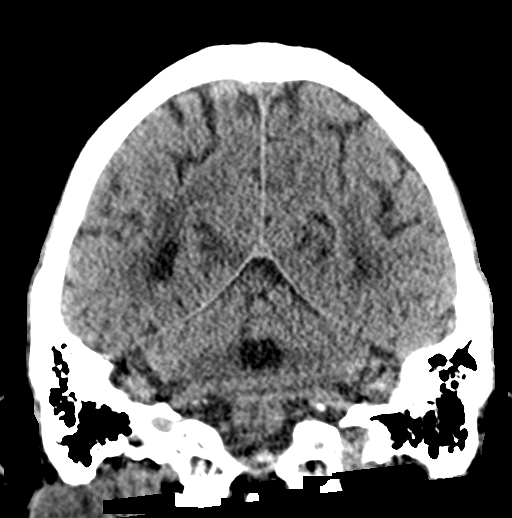
[im 30/68  brain]
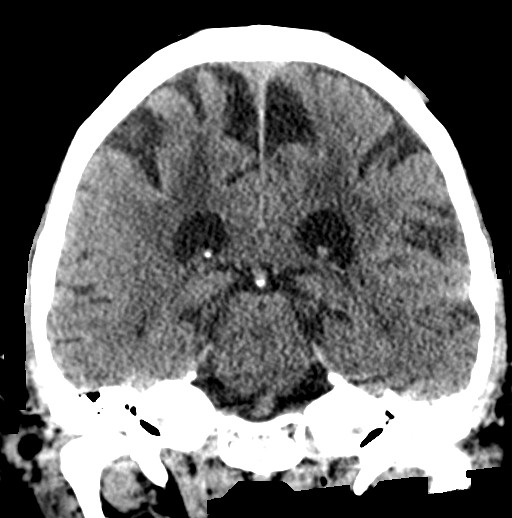
[im 38/68  brain]
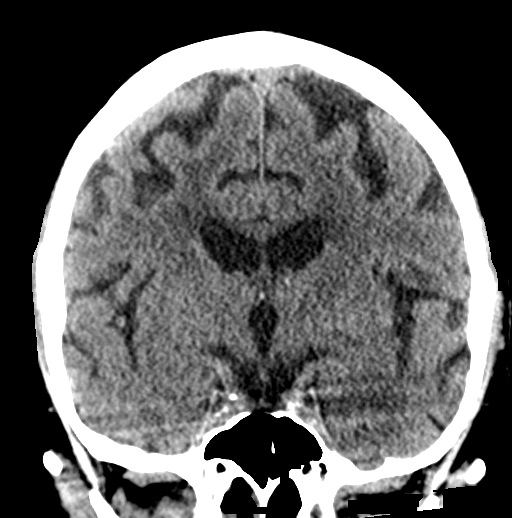

[Series 5: sagittal soft tissue · sagittal · 0.32mm/px · 3 of 55 slices shown]
[im 19/55  brain]
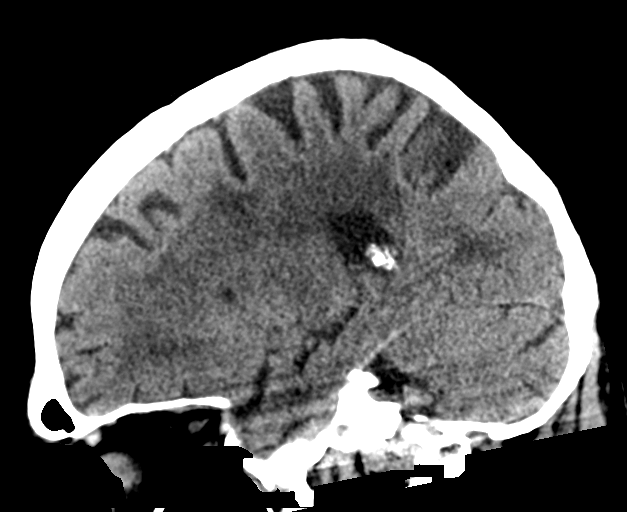
[im 28/55  brain]
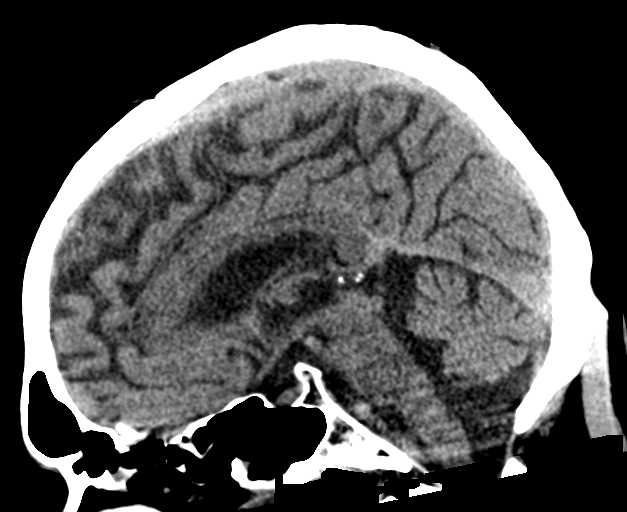
[im 37/55  brain]
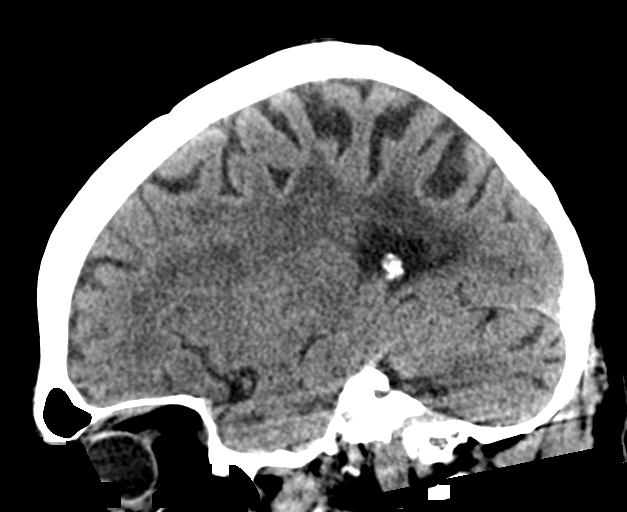

[17 of 47 positions shown; findings below may reference images not displayed]

FINDINGS: Brain: No evidence of acute infarction, hemorrhage, hydrocephalus,
extra-axial collection or mass lesion/mass effect. Mild generalized
cerebral atrophy. Moderate periventricular and subcortical white
matter hypodensities are nonspecific but favored to reflect chronic
microvascular ischemic changes. Lacunar infarcts in the left pons,
left basal ganglia, and bilateral thalami.

Vascular: Calcified atherosclerosis at the skullbase. No hyperdense
vessel.

Skull: Normal. Negative for fracture or focal lesion.

Sinuses/Orbits: No acute finding.

Other: 1.3 cm oval lesion in the subcutaneous tissues of the left
parietal scalp may represent a sebaceous cyst.
IMPRESSION: 1.  No acute intracranial abnormality.
2. Mild atrophy and moderate chronic microvascular ischemic changes.

## 2017-10-31 IMAGING — CR DG CHEST 2V
2 series · 2 of 2 positions shown · non-contrast
Comparison: Chest x-ray dated [DATE].

CLINICAL DATA: Worsening shortness of breath over the past 6
months. History of sickle cell anemia.

EXAM:
CHEST - 2 VIEW

[w chest lat]
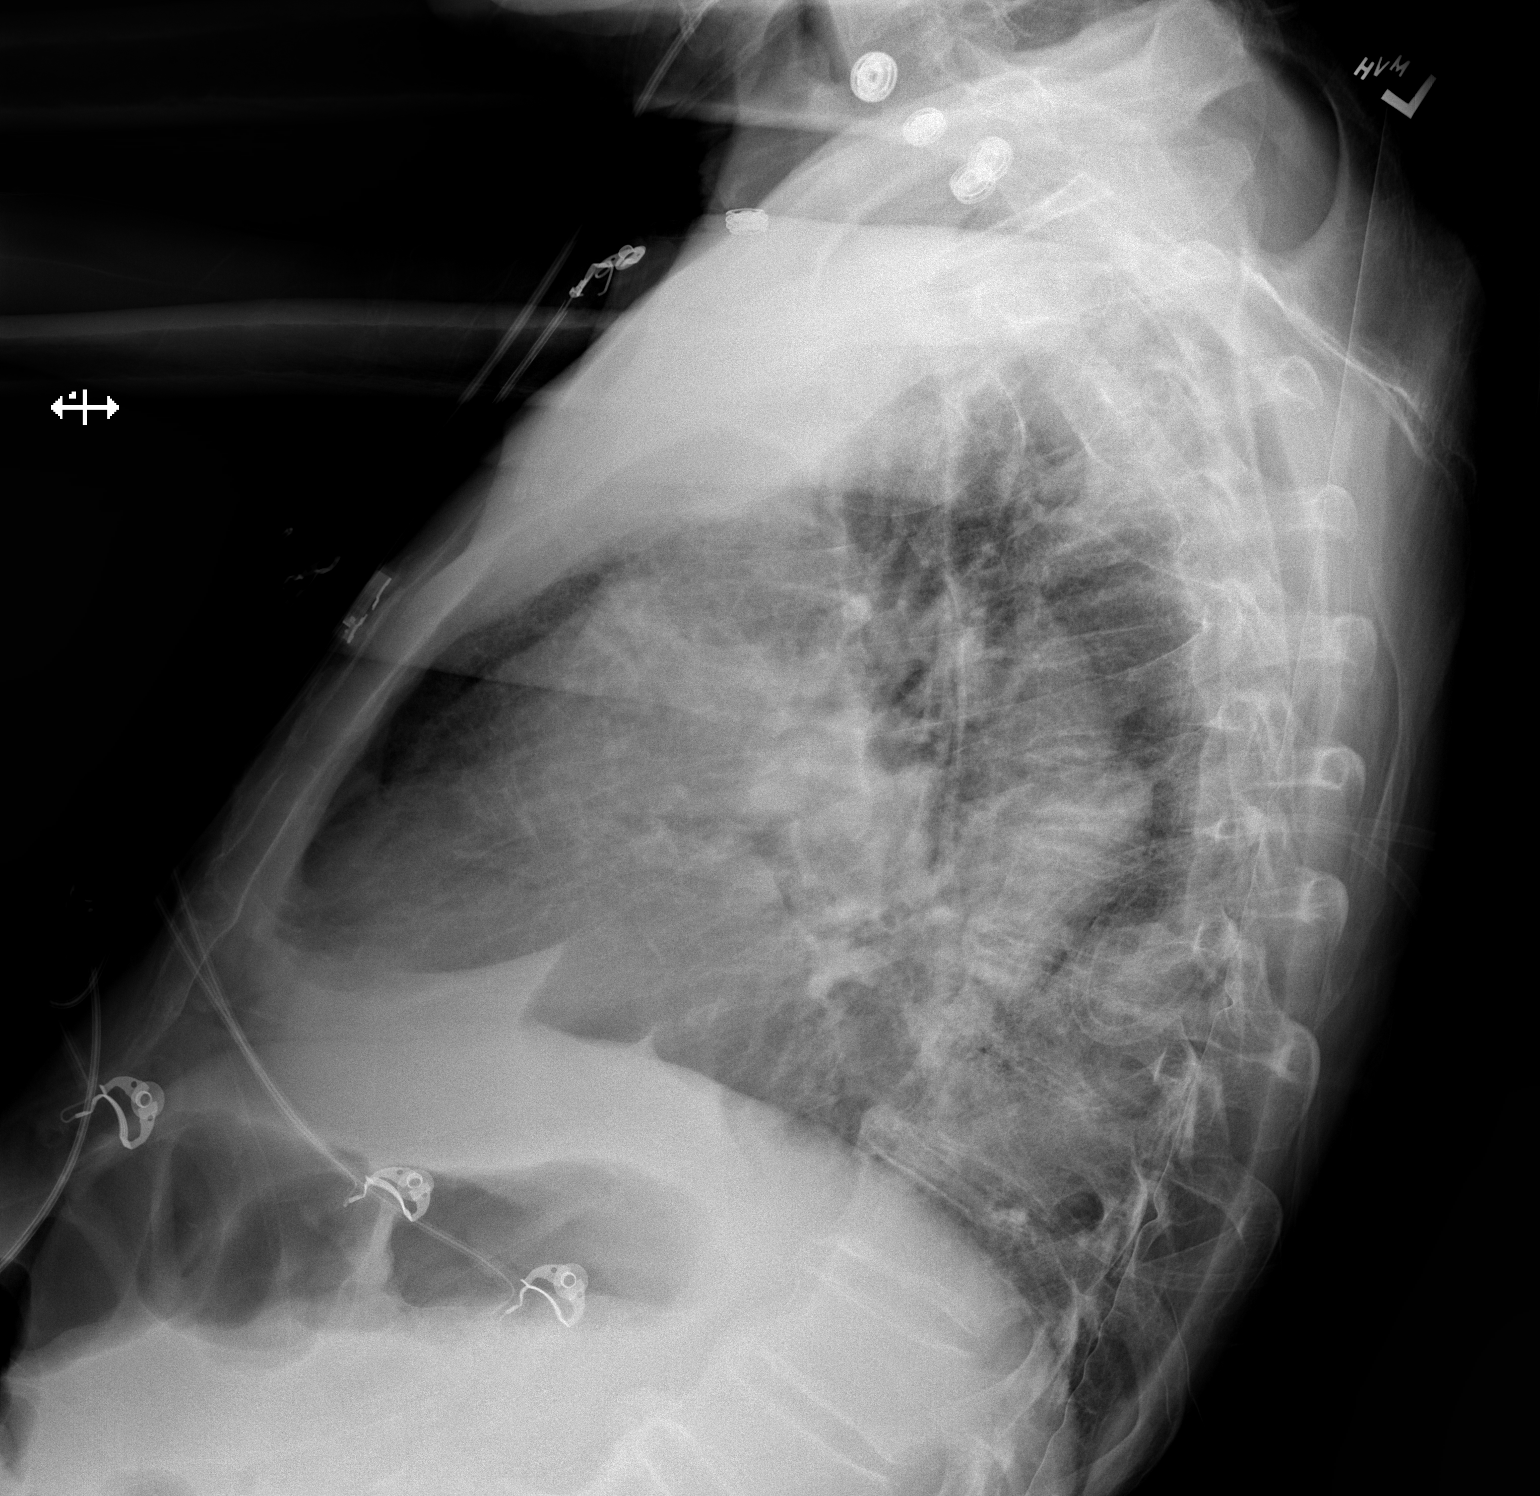

[x chest ap]
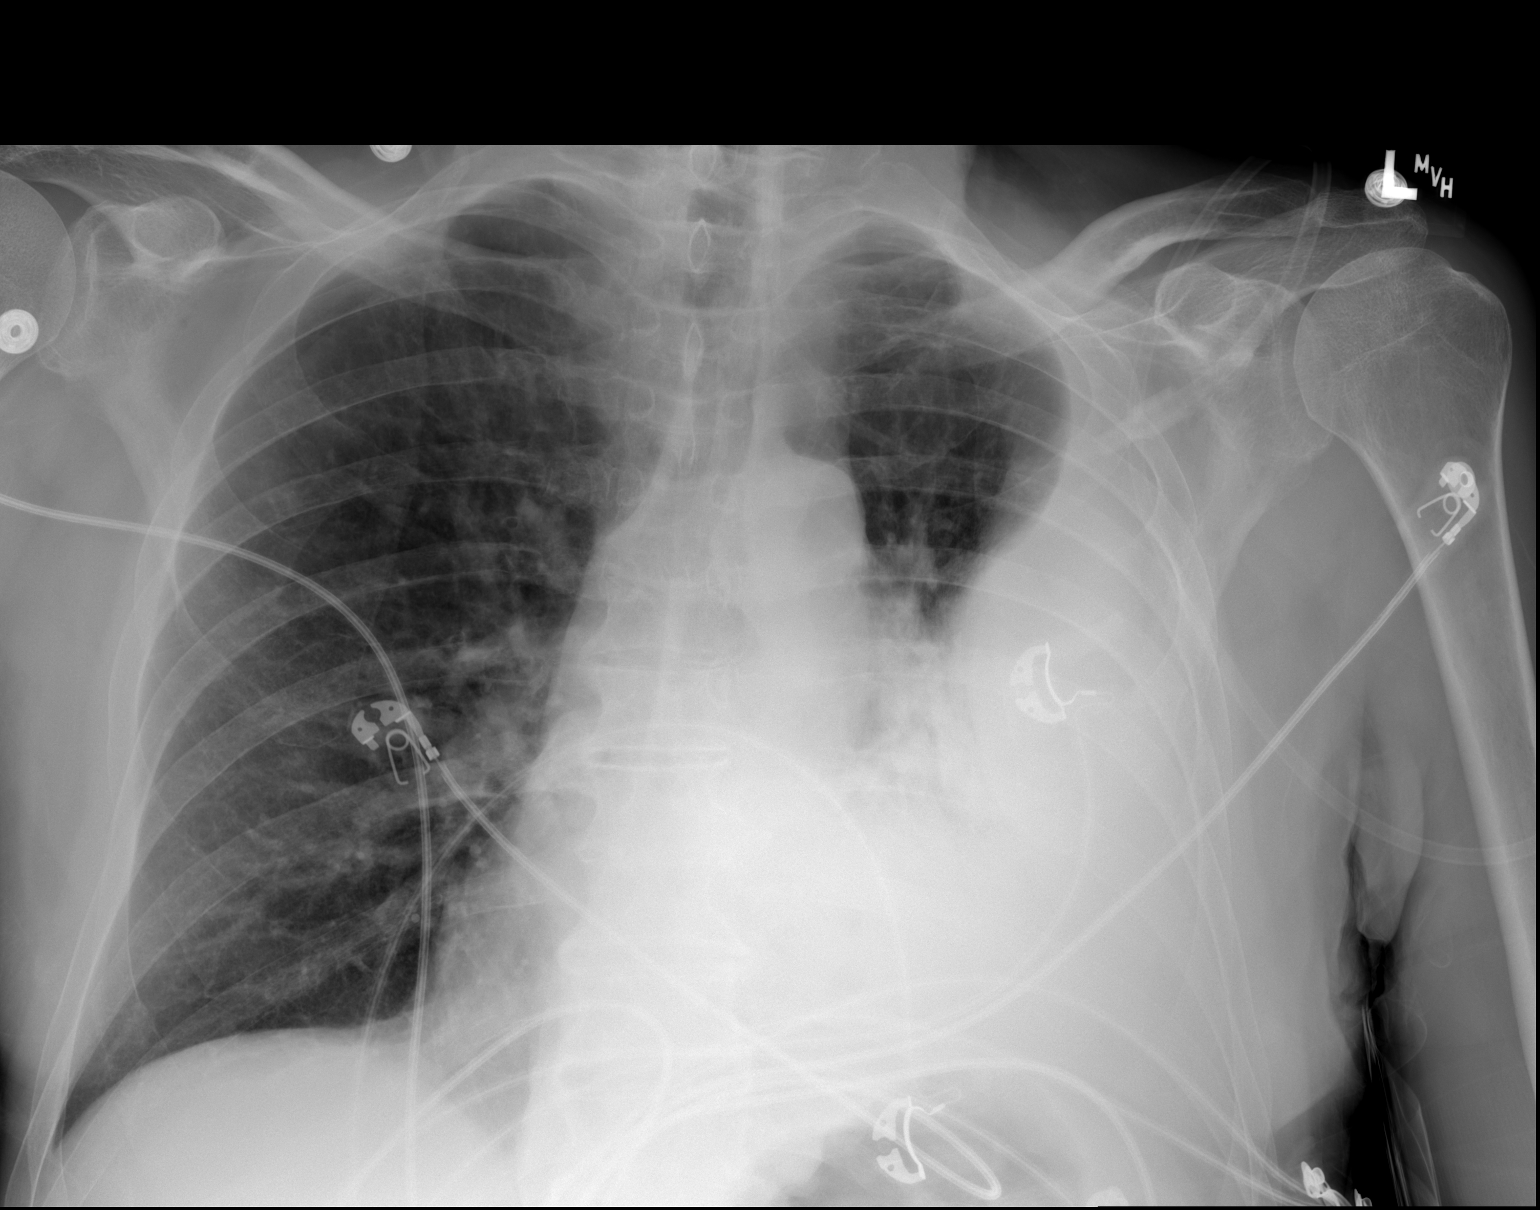

[2 of 2 positions shown; findings below may reference images not displayed]

FINDINGS: The left heart border is obscured. Moderate to large left-sided
pleural effusion with consolidation/collapse of the left lower lobe
and atelectasis in the lingula. The right lung is clear. No
pneumothorax. No acute osseous abnormality.
IMPRESSION: 1. New moderate to large left-sided pleural effusion with adjacent
left lung collapse.

## 2017-10-31 MED ORDER — PREDNISONE 20 MG PO TABS
60.0000 mg | ORAL_TABLET | Freq: Once | ORAL | Status: AC
  Administered 2017-10-31: 60 mg via ORAL
  Filled 2017-10-31: qty 3

## 2017-10-31 MED ORDER — SODIUM CHLORIDE 0.9 % IV BOLUS
500.0000 mL | Freq: Once | INTRAVENOUS | Status: AC
  Administered 2017-10-31: 500 mL via INTRAVENOUS

## 2017-10-31 MED ORDER — TAMSULOSIN HCL 0.4 MG PO CAPS: 0.4000 mg | ORAL_CAPSULE | Freq: Every day | ORAL | Status: DC

## 2017-10-31 MED ORDER — VANCOMYCIN HCL 10 G IV SOLR
1500.0000 mg | INTRAVENOUS | Status: DC
  Administered 2017-11-01: 1500 mg via INTRAVENOUS
  Filled 2017-10-31 (×2): qty 1500

## 2017-10-31 MED ORDER — IOPAMIDOL (ISOVUE-370) INJECTION 76%
INTRAVENOUS | Status: AC
  Filled 2017-10-31: qty 100

## 2017-10-31 MED ORDER — IOPAMIDOL (ISOVUE-370) INJECTION 76%
100.0000 mL | Freq: Once | INTRAVENOUS | Status: AC | PRN
  Administered 2017-10-31: 100 mL via INTRAVENOUS

## 2017-10-31 MED ORDER — VANCOMYCIN HCL 10 G IV SOLR
1500.0000 mg | Freq: Once | INTRAVENOUS | Status: AC
  Administered 2017-10-31: 1500 mg via INTRAVENOUS
  Filled 2017-10-31: qty 1500

## 2017-10-31 MED ORDER — IPRATROPIUM-ALBUTEROL 0.5-2.5 (3) MG/3ML IN SOLN
3.0000 mL | RESPIRATORY_TRACT | Status: AC
Start: 2017-10-31 — End: 2017-10-31
  Administered 2017-10-31 (×3): 3 mL via RESPIRATORY_TRACT
  Filled 2017-10-31: qty 9

## 2017-10-31 MED ORDER — SODIUM CHLORIDE 0.9 % IV SOLN
INTRAVENOUS | Status: DC
  Administered 2017-10-31 – 2017-11-01 (×2): via INTRAVENOUS

## 2017-10-31 MED ORDER — SODIUM CHLORIDE 0.9 % IV SOLN
2.0000 g | INTRAVENOUS | Status: AC
  Administered 2017-10-31: 2 g via INTRAVENOUS
  Filled 2017-10-31: qty 2

## 2017-10-31 MED ORDER — SODIUM CHLORIDE 0.9 % IV SOLN
1.0000 g | Freq: Three times a day (TID) | INTRAVENOUS | Status: DC
  Filled 2017-10-31: qty 1

## 2017-10-31 MED ORDER — ENOXAPARIN SODIUM 40 MG/0.4ML ~~LOC~~ SOLN
40.0000 mg | Freq: Every day | SUBCUTANEOUS | Status: DC
  Administered 2017-10-31: 40 mg via SUBCUTANEOUS
  Filled 2017-10-31: qty 0.4

## 2017-10-31 MED ORDER — LISINOPRIL 20 MG PO TABS: 20.0000 mg | ORAL_TABLET | Freq: Every day | ORAL | Status: DC

## 2017-10-31 MED ORDER — SODIUM CHLORIDE 0.9 % IJ SOLN
INTRAMUSCULAR | Status: AC
  Filled 2017-10-31: qty 50

## 2017-10-31 MED ORDER — PIPERACILLIN-TAZOBACTAM 3.375 G IVPB 30 MIN
3.3750 g | Freq: Once | INTRAVENOUS | Status: DC
  Administered 2017-10-31: 3.375 g via INTRAVENOUS
  Filled 2017-10-31: qty 50

## 2017-10-31 NOTE — ED Notes (Signed)
Bed: WA08 Expected date:  Expected time:  Means of arrival:  Comments: EMS-SOB 

## 2017-10-31 NOTE — ED Notes (Addendum)
Pt is alert and oriented x 4 and is verbally responsive, Pt reports left rib pain 8/10. Pt reports that he has had a productive cough x a few weeks with yellow phlegm. Pt l/s are diminished bialterally and remains tachy at 121. Pt is breathing at 30 RR/ min.shallow and non-labored, O2 saturation between 88-96% on 3 lmp of oxygen. Pt has an abscess noted to lower rt side chest.

## 2017-10-31 NOTE — ED Notes (Signed)
ED TO INPATIENT HANDOFF REPORT  Name/Age/Gender Dan Salazar 57 y.o. male  Code Status Code Status History    Date Active Date Inactive Code Status Order ID Comments User Context   04/30/2017 1543 05/01/2017 1303 Full Code [id number]  Greer Pickerel, MD Inpatient   09/28/2016 1402 09/30/2016 1526 Full Code [id number]  Lorna Few, DO ED      Home/SNF/Other Home  Chief Complaint shortness of breath  Level of Care/Admitting Diagnosis ED Disposition    ED Disposition Condition [place]: [facility] [100102]  Level of Care: Stepdown [14]  Admit to SDU based on following criteria: Respiratory Distress:  Frequent assessment and/or intervention to maintain adequate ventilation/respiration, pulmonary toilet, and respiratory treatment.  Diagnosis: Pleural effusion [761950]  Admitting Physician: Jani Gravel [[id number]]  Attending Physician: Jani Gravel [phone]  Estimated length of stay: past midnight tomorrow  Certification:: I certify this patient will need inpatient services for at least 2 midnights  PT Class (Do Not Modify): Inpatient [101]  PT Acc Code (Do Not Modify): Private [1]       Medical History Past Medical History:  Diagnosis Date  . Dyspnea   . Hypertension     Allergies Allergies  Allergen Reactions  . Hctz [Hydrochlorothiazide] Other (See Comments)    Severe electrolyte abnormalities    IV Location/Drains/Wounds Patient Lines/Drains/Airways Status   Active Line/Drains/Airways    Name:   Placement date:   Placement time:   Site:   Days:   Incision 03/02/13 Hand Left   03/02/13    1033     1704   Incision (Closed) 04/30/17 Abdomen Other (Comment)   04/30/17    1326     184   Incision - 3 Ports Abdomen Left Umbilicus Right   71/24/58    1245     184          Labs/Imaging Results for orders placed or performed during the hospital encounter of 10/31/17 (from the past 48 hour(s))  CBC with Differential     Status:  Abnormal   Collection Time: 10/31/17  5:14 PM  Result Value Ref Range   WBC 23.9 (H) 4.0 - 10.5 K/uL   RBC 3.98 (L) 4.22 - 5.81 MIL/uL   Hemoglobin 12.6 (L) 13.0 - 17.0 g/dL   HCT 37.5 (L) 39.0 - 52.0 %   MCV 94.2 78.0 - 100.0 fL   MCH 31.7 26.0 - 34.0 pg   MCHC 33.6 30.0 - 36.0 g/dL   RDW 13.6 11.5 - 15.5 %   Platelets 363 150 - 400 K/uL   Neutrophils Relative % 92 %   Neutro Abs 22.0 (H) 1.7 - 7.7 K/uL   Lymphocytes Relative 4 %   Lymphs Abs 0.9 0.7 - 4.0 K/uL   Monocytes Relative 4 %   Monocytes Absolute 1.0 0.1 - 1.0 K/uL   Eosinophils Relative 0 %   Eosinophils Absolute 0.0 0.0 - 0.7 K/uL   Basophils Relative 0 %   Basophils Absolute 0.0 0.0 - 0.1 K/uL    Comment: Performed at [facility], [place] [address], [place], [place] [address]  Basic metabolic panel     Status: Abnormal   Collection Time: 10/31/17  5:14 PM  Result Value Ref Range   Sodium 133 (L) 135 - 145 mmol/L   Potassium 4.6 3.5 - 5.1 mmol/L   Chloride 95 (L) 101 - 111 mmol/L   CO2 27 22 - 32 mmol/L   Glucose,  Bld 126 (H) 65 - 99 mg/dL   BUN 18 6 - 20 mg/dL   Creatinine, Ser 1.21 0.61 - 1.24 mg/dL   Calcium 8.8 (L) 8.9 - 10.3 mg/dL   GFR calc non Af Amer >60 >60 mL/min   GFR calc Af Amer >60 >60 mL/min    Comment: (NOTE) The eGFR has been calculated using the CKD EPI equation. This calculation has not been validated in all clinical situations. eGFR's persistently <60 mL/min signify possible Chronic Kidney Disease.    Anion gap 11 5 - 15    Comment: Performed at [facility], [place] [address], [place], [place] [address]  Blood gas, venous     Status: Abnormal   Collection Time: 10/31/17  5:20 PM  Result Value Ref Range   O2 Content 3.0 L/min   Delivery systems NASAL CANNULA    pH, Ven 7.399 7.250 - 7.430   pCO2, Ven 44.6 44.0 - 60.0 mmHg   pO2, Ven below reportable range 32.0 - 45.0 mmHg    Comment: RBV DAN FLOYD,MD AT 1732 BY KENNAN DEPUE,RRT,RCP ON 10/31/2017    Bicarbonate 27.0 20.0 - 28.0 mmol/L   Acid-Base Excess 2.3 (H) 0.0 - 2.0 mmol/L   O2 Saturation 36.3 %   Patient temperature 98.6    Collection site VEIN    Drawn by [phone]    Sample type VEIN     Comment: Performed at [facility], [place] [address], [place], [place] [address]  I-stat troponin, ED     Status: None   Collection Time: 10/31/17  5:22 PM  Result Value Ref Range   Troponin i, poc 0.00 0.00 - 0.08 ng/mL   Comment 3            Comment: Due to the release kinetics of cTnI, a negative result within the first hours of the onset of symptoms does not rule out myocardial infarction with certainty. If myocardial infarction is still suspected, repeat the test at appropriate intervals.    Dg Chest 2 View  Result Date: 10/31/2017 CLINICAL DATA:  Worsening shortness of breath over the past 6 months. History of sickle cell anemia. EXAM: CHEST - 2 VIEW COMPARISON:  Chest x-ray dated September 28, 2016. FINDINGS: The left heart border is obscured. Moderate to large left-sided pleural effusion with consolidation/collapse of the left lower lobe and atelectasis in the lingula. The right lung is clear. No pneumothorax. No acute osseous abnormality. IMPRESSION: 1. New moderate to large left-sided pleural effusion with adjacent left lung collapse. Electronically Signed   By: Titus Dubin M.D.   On: 10/31/2017 18:32    Pending Labs Unresulted Labs (From admission, onward)   Start     Ordered   11/01/17 0500  Creatinine, serum  Daily,   R     10/31/17 2024   10/31/17 1639  .Cooxemetry Panel (carboxy, met, total hgb, O2 sat)  Once,   R     10/31/17 1640   Signed and Held  Culture, blood (routine x 2) Call MD if unable to obtain prior to antibiotics being given  BLOOD CULTURE X 2,   R    Comments:  If blood cultures drawn in Emergency Department - Do not draw and cancel order   Question:  Patient immune status  Answer:  Normal   Signed and Held   Signed and Held  Culture,  sputum-assessment  Once,   R    Question:  Patient immune status  Answer:  Normal  Signed and Held   Signed and Held  Gram stain  Once,   R    Question:  Patient immune status  Answer:  Normal   Signed and Held   Signed and Held  HIV antibody (Routine Screening)  Once,   R     Signed and Held   Signed and Held  Strep pneumoniae urinary antigen  Once,   R     Signed and Held   Signed and Held  Creatinine, serum  (enoxaparin (LOVENOX)    CrCl >/= 30 ml/min)  Weekly,   R    Comments:  while on enoxaparin therapy    Signed and Held   Signed and Held  Troponin I (q 6hr x 3)  Now then every 6 hours,   R     Signed and Held   Signed and Held  CBC  Tomorrow morning,   R     Signed and Held   Signed and Held  Legionella Pneumophila Serogp 1 Ur Ag  Once,   R     Signed and Held      Vitals/Pain Today's Vitals   10/31/17 1733 10/31/17 1734 10/31/17 1954 10/31/17 2049  BP:   93/67 102/70  Pulse:   (!) 117 100  Resp:   18 (!) 30  Temp:      TempSrc:      SpO2:   95% 95%  Weight: 175 lb (79.4 kg)     Height: '6\' 2"'$  (1.88 m)     PainSc:  8       Isolation Precautions No active isolations  Medications Medications  vancomycin (VANCOCIN) 1,500 mg in sodium chloride 0.9 % 500 mL IVPB (1,500 mg Intravenous New Bag/Given 10/31/17 1920)  ceFEPIme (MAXIPIME) 2 g in sodium chloride 0.9 % 100 mL IVPB (has no administration in time range)  ceFEPIme (MAXIPIME) 1 g in sodium chloride 0.9 % 100 mL IVPB (has no administration in time range)  vancomycin (VANCOCIN) 1,500 mg in sodium chloride 0.9 % 500 mL IVPB (has no administration in time range)  sodium chloride 0.9 % injection (has no administration in time range)  iopamidol (ISOVUE-370) 76 % injection (has no administration in time range)  ipratropium-albuterol (DUONEB) 0.5-2.5 (3) MG/3ML nebulizer solution 3 mL (3 mLs Nebulization Given 10/31/17 1655)  predniSONE (DELTASONE) tablet 60 mg (60 mg Oral Given 10/31/17 1727)  iopamidol (ISOVUE-370) 76 %  injection 100 mL (100 mLs Intravenous Contrast Given 10/31/17 2053)    Mobility walks

## 2017-10-31 NOTE — Progress Notes (Signed)
Hypotension Tele Trop I q6hx3 Check cortisol Check cardiac echo Will give ns 523mL iv x1 Pulmonary consult for ? Mucous plugging as well as for ? Loculated pleural effusion

## 2017-10-31 NOTE — H&P (Signed)
TRH H&P   Patient Demographics:    Dan Salazar, is a 57 y.o. male  MRN: [id number]   DOB - November 08, 1960  Admit Date - 10/31/2017  Outpatient Primary MD for the patient is Patient, No Pcp Per  Referring MD/NP/PA:   Deno Etienne  Outpatient Specialists:   Patient coming from: home  Chief Complaint  Patient presents with  . Shortness of Breath      HPI:    Dan Salazar  is a 57 y.o. male, w Hypertension c/o dyspnea for the past 4 weeks. Worse today. Slight cough with yellow sputum.  Denies fever, chills, cp, palp,  N/v, diarrhea, brbpr.  Pt presented due to dyspnea.   In ED.  CXR IMPRESSION: 1. New moderate to large left-sided pleural effusion with adjacent left lung collapse.  Wbc 23.9, hgb 12.6, Plt 363 Na 133, K 4.6, Bun 18, Creatinine 1.21  PH 7.399 Pco2 44.6    Trop I 0.00  Pt will be admitted for left pleural effusion, and tachycardia and leukocytosis.     Review of systems:    In addition to the HPI above,  No Fever-chills, No Headache, No changes with Vision or hearing, No problems swallowing food or Liquids, No Chest pain No Abdominal pain, No Nausea or Vommitting, Bowel movements are regular, No Blood in stool or Urine, No dysuria, No new skin rashes or bruises, No new joints pains-aches,  No new weakness, tingling, numbness in any extremity, No recent weight gain or loss, No polyuria, polydypsia or polyphagia, No significant Mental Stressors.  A full 10 point Review of Systems was done, except as stated above, all other Review of Systems were negative.   With Past History of the following :    Past Medical History:  Diagnosis Date  . Dyspnea   . Hypertension       Past Surgical History:  Procedure Laterality Date  . HERNIA REPAIR     Lap diagnostic inguinal hernia repar 04/30/17 Dr. Greer Pickerel  . INGUINAL HERNIA REPAIR Left 04/30/2017     Procedure: OPEN REPAIR MASSIVE INCARCERATED LEFT INGUINAL HERNIA REPAIR WITH MESH;  Surgeon: Greer Pickerel, MD;  Location: [facility];  Service: General;  Laterality: Left;  . INSERTION OF MESH Left 04/30/2017   Procedure: INSERTION OF MESH;  Surgeon: Greer Pickerel, MD;  Location: [facility];  Service: General;  Laterality: Left;  . LAPAROSCOPY N/A 04/30/2017   Procedure: LAPAROSCOPY DIAGNOSTIC;  Surgeon: Greer Pickerel, MD;  Location: [facility];  Service: General;  Laterality: N/A;  . OPEN REDUCTION INTERNAL FIXATION (ORIF) DISTAL RADIAL FRACTURE Left 03/02/2013   Procedure: OPEN REDUCTION INTERNAL FIXATION (ORIF) LEFT DISTAL RADIAL FRACTURE;  Surgeon: Jolyn Nap, MD;  Location: [facility];  Service: Orthopedics;  Laterality: Left;      Social History:     Social History   Tobacco Use  . Smoking status:  Never Smoker  . Smokeless tobacco: Never Used  Substance Use Topics  . Alcohol use: Yes    Comment: social     Lives - at home Mobility - walks by self   Family History :    History reviewed. No pertinent family history. Negative for Copd   Home Medications:   Prior to Admission medications   Medication Sig Start Date End Date Taking? Authorizing Provider  lisinopril (PRINIVIL,ZESTRIL) 40 MG tablet Take 20 mg by mouth daily.  09/19/16  Yes [provider]  tamsulosin (FLOMAX) 0.4 MG CAPS capsule Take 0.4 mg by mouth daily.   Yes [provider]  traMADol (ULTRAM) 50 MG tablet Take 1 tablet (50 mg total) by mouth every 6 (six) hours as needed (mild pain). Patient not taking: Reported on 10/31/2017 05/01/17   Greer Pickerel, MD     Allergies:     Allergies  Allergen Reactions  . Hctz [Hydrochlorothiazide] Other (See Comments)    Severe electrolyte abnormalities     Physical Exam:   Vitals  Blood pressure 93/67, pulse (!) 117, temperature 99.8 F (37.7 C), temperature source Oral, resp. rate 18, height 6\' 2"  (1.88 m), weight 79.4 kg (175 lb), SpO2  95 %.   1. General  lying in bed in NAD,    2. Normal affect and insight, Not Suicidal or Homicidal, Awake Alert, Oriented X 3.  3. No F.N deficits, ALL C.Nerves Intact, Strength 5/5 all 4 extremities, Sensation intact all 4 extremities, Plantars down going.  4. Ears and Eyes appear Normal, Conjunctivae clear, PERRLA. Moist Oral Mucosa.  5. Supple Neck, No JVD, No cervical lymphadenopathy appriciated, No Carotid Bruits.  6. Symmetrical Chest wall movement tight, , decrease in breath sounds left lung base, about 1/2 up   7.tachycardia s1, s2, no m/g/r  8. Positive Bowel Sounds, Abdomen Soft, No tenderness, No organomegaly appriciated,No rebound -guarding or rigidity.  9.  No Cyanosis, Normal Skin Turgor, No Skin Rash or Bruise.  10. Good muscle tone,  joints appear normal , no effusions, Normal ROM.  11. No Palpable Lymph Nodes in Neck or Axillae     Data Review:    CBC Recent Labs  Lab 10/31/17 1714  WBC 23.9*  HGB 12.6*  HCT 37.5*  PLT 363  MCV 94.2  MCH 31.7  MCHC 33.6  RDW 13.6  LYMPHSABS 0.9  MONOABS 1.0  EOSABS 0.0  BASOSABS 0.0   ------------------------------------------------------------------------------------------------------------------  Chemistries  Recent Labs  Lab 10/31/17 1714  NA 133*  K 4.6  CL 95*  CO2 27  GLUCOSE 126*  BUN 18  CREATININE 1.21  CALCIUM 8.8*   ------------------------------------------------------------------------------------------------------------------ estimated creatinine clearance is 73.8 mL/min (by C-G formula based on SCr of 1.21 mg/dL). ------------------------------------------------------------------------------------------------------------------ No results for input(s): TSH, T4TOTAL, T3FREE, THYROIDAB in the last 72 hours.  Invalid input(s): FREET3  Coagulation profile No results for input(s): INR, PROTIME in the last 168  hours. ------------------------------------------------------------------------------------------------------------------- No results for input(s): DDIMER in the last 72 hours. -------------------------------------------------------------------------------------------------------------------  Cardiac Enzymes No results for input(s): CKMB, TROPONINI, MYOGLOBIN in the last 168 hours.  Invalid input(s): CK ------------------------------------------------------------------------------------------------------------------    Component Value Date/Time   BNP 16.4 09/28/2016 0939     ---------------------------------------------------------------------------------------------------------------  Urinalysis    Component Value Date/Time   COLORURINE YELLOW 09/28/2016 0942   APPEARANCEUR CLEAR 09/28/2016 0942   LABSPEC 1.004 (L) 09/28/2016 0942   PHURINE 6.0 09/28/2016 [place] 09/28/2016 0942   HGBUR NEGATIVE 09/28/2016 0942   BILIRUBINUR  NEGATIVE 09/28/2016 [place] 09/28/2016 0942   PROTEINUR NEGATIVE 09/28/2016 0942   NITRITE NEGATIVE 09/28/2016 0942   LEUKOCYTESUR NEGATIVE 09/28/2016 0942    ----------------------------------------------------------------------------------------------------------------   Imaging Results:    Dg Chest 2 View  Result Date: 10/31/2017 CLINICAL DATA:  Worsening shortness of breath over the past 6 months. History of sickle cell anemia. EXAM: CHEST - 2 VIEW COMPARISON:  Chest x-ray dated September 28, 2016. FINDINGS: The left heart border is obscured. Moderate to large left-sided pleural effusion with consolidation/collapse of the left lower lobe and atelectasis in the lingula. The right lung is clear. No pneumothorax. No acute osseous abnormality. IMPRESSION: 1. New moderate to large left-sided pleural effusion with adjacent left lung collapse. Electronically Signed   By: Titus Dubin M.D.   On: 10/31/2017 18:32       Assessment & Plan:    Principal Problem:   Pleural effusion Active Problems:   Hyponatremia   Tachycardia    Left pleural effusion NPO after MN CTA chest pending Pulmonary consult requested for evaluation vanco iv, cefepime iv pharmacy to dose  Tachycardia Tele Trop I q6h x3 Cardiac echo  Hyponatremia Check cmp in am    DVT Prophylaxis Lovenox - SCDs  AM Labs Ordered, also please review Full Orders  Family Communication: Admission, patients condition and plan of care including tests being ordered have been discussed with the patient  who indicate understanding and agree with the plan and Code Status.  Code Status FULL CODE  Likely DC to  home  Condition GUARDED    Consults called: pulmonary consult  Admission status: inpatient  Time spent in minutes : 45   Jani Gravel M.D on 10/31/2017 at 7:59 PM  Between 7am to 7pm - Pager - [phone]. After 7pm go to [email or web address] - password [facility]  [facility] - Office  [phone]

## 2017-10-31 NOTE — Progress Notes (Addendum)
Pharmacy Antibiotic Note  Dan Salazar is a 57 y.o. male admitted on 10/31/2017 with pneumonia.  Pharmacy has been consulted for vanc/cefepime dosing.  Plan: 1) Vancomycin 1500mg  IV x 1 then 1500mg  IV q24 - goal AUC 400-500 2) Cefepime 2g x 1 then 1g IV q8 3) Daily SCr  Height: 6\' 2"  (188 cm) Weight: 175 lb (79.4 kg) IBW/kg (Calculated) : 82.2  Temp (24hrs), Avg:99.8 F (37.7 C), Min:99.8 F (37.7 C), Max:99.8 F (37.7 C)  Recent Labs  Lab 10/31/17 1714  WBC 23.9*  CREATININE 1.21    Estimated Creatinine Clearance: 73.8 mL/min (by C-G formula based on SCr of 1.21 mg/dL).    Allergies  Allergen Reactions  . Hctz [Hydrochlorothiazide] Other (See Comments)    Severe electrolyte abnormalities    Thank you for allowing pharmacy to be a part of this patient's care.   Adrian Saran, PharmD, BCPS Pager [phone] 10/31/2017 8:22 PM

## 2017-10-31 NOTE — ED Provider Notes (Signed)
[facility] DEPT Provider Note   CSN: [id number] Arrival date & time: 10/31/17  1626     History   Chief Complaint Chief Complaint  Patient presents with  . Shortness of Breath    HPI Dan Salazar is a 57 y.o. male.  57 yo M with a chief complaint of shortness of breath.  This been going on for a long time but worsening over the past week.  The patient denies smoking use denies history of COPD or asthma.  He denies fevers.  Has had some cough.  Denies lower extremity edema denies history of heart failure.  Denies change in medications.  Denies headache.  The history is provided by the patient.  Shortness of Breath  This is a new problem. The average episode lasts 2 months. The problem occurs frequently.The current episode started more than 1 week ago. The problem has been rapidly worsening. Pertinent negatives include no fever, no headaches, no chest pain, no vomiting, no abdominal pain and no rash. He has tried nothing for the symptoms. The treatment provided no relief. He has had prior hospitalizations. He has had no prior ED visits. Associated medical issues do not include asthma or COPD.    Past Medical History:  Diagnosis Date  . Dyspnea   . Hypertension     Patient Active Problem List   Diagnosis Date Noted  . Pleural effusion 10/31/2017  . Incarcerated inguinal hernia 04/30/2017  . Hypokalemia 09/28/2016  . Hyponatremia   . Metabolic alkalosis   . Essential hypertension     Past Surgical History:  Procedure Laterality Date  . HERNIA REPAIR     Lap diagnostic inguinal hernia repar 04/30/17 Dr. Greer Pickerel  . INGUINAL HERNIA REPAIR Left 04/30/2017   Procedure: OPEN REPAIR MASSIVE INCARCERATED LEFT INGUINAL HERNIA REPAIR WITH MESH;  Surgeon: Greer Pickerel, MD;  Location: [facility];  Service: General;  Laterality: Left;  . INSERTION OF MESH Left 04/30/2017   Procedure: INSERTION OF MESH;  Surgeon: Greer Pickerel, MD;  Location: [facility];   Service: General;  Laterality: Left;  . LAPAROSCOPY N/A 04/30/2017   Procedure: LAPAROSCOPY DIAGNOSTIC;  Surgeon: Greer Pickerel, MD;  Location: [facility];  Service: General;  Laterality: N/A;  . OPEN REDUCTION INTERNAL FIXATION (ORIF) DISTAL RADIAL FRACTURE Left 03/02/2013   Procedure: OPEN REDUCTION INTERNAL FIXATION (ORIF) LEFT DISTAL RADIAL FRACTURE;  Surgeon: Jolyn Nap, MD;  Location: [facility];  Service: Orthopedics;  Laterality: Left;        Home Medications    Prior to Admission medications   Medication Sig Start Date End Date Taking? Authorizing Provider  lisinopril (PRINIVIL,ZESTRIL) 40 MG tablet Take 20 mg by mouth daily.  09/19/16  Yes [provider]  tamsulosin (FLOMAX) 0.4 MG CAPS capsule Take 0.4 mg by mouth daily.   Yes [provider]  traMADol (ULTRAM) 50 MG tablet Take 1 tablet (50 mg total) by mouth every 6 (six) hours as needed (mild pain). Patient not taking: Reported on 10/31/2017 05/01/17   Greer Pickerel, MD    Family History History reviewed. No pertinent family history.  Social History Social History   Tobacco Use  . Smoking status: Never Smoker  . Smokeless tobacco: Never Used  Substance Use Topics  . Alcohol use: Yes    Comment: social  . Drug use: No     Allergies   Hctz [hydrochlorothiazide]   Review of Systems Review of Systems  Constitutional: Negative for chills and fever.  HENT:  Negative for congestion and facial swelling.   Eyes: Negative for discharge and visual disturbance.  Respiratory: Positive for shortness of breath.   Cardiovascular: Negative for chest pain and palpitations.  Gastrointestinal: Negative for abdominal pain, diarrhea and vomiting.  Musculoskeletal: Negative for arthralgias and myalgias.  Skin: Negative for color change and rash.  Neurological: Negative for tremors, syncope and headaches.  Psychiatric/Behavioral: Negative for confusion and dysphoric mood.     Physical  Exam Updated Vital Signs BP 97/69 (BP Location: Left Arm)   Pulse (!) 126   Temp 99.8 F (37.7 C) (Oral)   Resp (!) 37   Ht 6\' 2"  (1.88 m)   Wt 79.4 kg (175 lb)   SpO2 92%   BMI 22.47 kg/m   Physical Exam  Constitutional: He is oriented to person, place, and time. He appears well-developed and well-nourished.  HENT:  Head: Normocephalic and atraumatic.  Eyes: Pupils are equal, round, and reactive to light. EOM are normal.  Neck: Normal range of motion. Neck supple. No JVD present.  Cardiovascular: Normal rate and regular rhythm. Exam reveals no gallop and no friction rub.  No murmur heard. Pulmonary/Chest: No respiratory distress. He has no wheezes.  Diminished in all fields   Abdominal: He exhibits no distension and no mass. There is no tenderness. There is no rebound and no guarding.  Musculoskeletal: Normal range of motion.  Neurological: He is alert and oriented to person, place, and time.  Skin: No rash noted. No pallor.  Psychiatric: He has a normal mood and affect. His behavior is normal.  Nursing note and vitals reviewed.    ED Treatments / Results  Labs (all labs ordered are listed, but only abnormal results are displayed) Labs Reviewed  CBC WITH DIFFERENTIAL/PLATELET - Abnormal; Notable for the following components:      Result Value   WBC 23.9 (*)    RBC 3.98 (*)    Hemoglobin 12.6 (*)    HCT 37.5 (*)    Neutro Abs 22.0 (*)    All other components within normal limits  BASIC METABOLIC PANEL - Abnormal; Notable for the following components:   Sodium 133 (*)    Chloride 95 (*)    Glucose, Bld 126 (*)    Calcium 8.8 (*)    All other components within normal limits  BLOOD GAS, VENOUS - Abnormal; Notable for the following components:   Acid-Base Excess 2.3 (*)    All other components within normal limits  COOXEMETRY PANEL  I-STAT TROPONIN, ED    EKG None  Radiology Dg Chest 2 View  Result Date: 10/31/2017 CLINICAL DATA:  Worsening shortness of  breath over the past 6 months. History of sickle cell anemia. EXAM: CHEST - 2 VIEW COMPARISON:  Chest x-ray dated September 28, 2016. FINDINGS: The left heart border is obscured. Moderate to large left-sided pleural effusion with consolidation/collapse of the left lower lobe and atelectasis in the lingula. The right lung is clear. No pneumothorax. No acute osseous abnormality. IMPRESSION: 1. New moderate to large left-sided pleural effusion with adjacent left lung collapse. Electronically Signed   By: Titus Dubin M.D.   On: 10/31/2017 18:32    Procedures Procedures (including critical care time)  Medications Ordered in ED Medications  vancomycin (VANCOCIN) 1,500 mg in sodium chloride 0.9 % 500 mL IVPB (1,500 mg Intravenous New Bag/Given 10/31/17 1920)  piperacillin-tazobactam (ZOSYN) IVPB 3.375 g (has no administration in time range)  ipratropium-albuterol (DUONEB) 0.5-2.5 (3) MG/3ML nebulizer solution 3 mL (3 mLs  Nebulization Given 10/31/17 1655)  predniSONE (DELTASONE) tablet 60 mg (60 mg Oral Given 10/31/17 1727)     Initial Impression / Assessment and Plan / ED Course  I have reviewed the triage vital signs and the nursing notes.  Pertinent labs & imaging results that were available during my care of the patient were reviewed by me and considered in my medical decision making (see chart for details).     57 yo M with a cc of sob. Going on for quite some time but worsening over the past week.  Cough, but denies other symptoms.  Diminished breath sounds in all fields, will obtain cxr, labs, give duonebs, steroids and reassess.   Patient found to have a large left-sided pleural effusion.  We will cover broadly with antibiotics as the patient has had some cough going on for the past couple months.  Discussed with the hospitalist and will obtain a CT scan to further evaluate the pleural effusion for possible empyema.  Admit.  CRITICAL CARE Performed by: Cecilio Asper   Total critical  care time: 80 minutes  Critical care time was exclusive of separately billable procedures and treating other patients.  Critical care was necessary to treat or prevent imminent or life-threatening deterioration.  Critical care was time spent personally by me on the following activities: development of treatment plan with patient and/or surrogate as well as nursing, discussions with consultants, evaluation of patient's response to treatment, examination of patient, obtaining history from patient or surrogate, ordering and performing treatments and interventions, ordering and review of laboratory studies, ordering and review of radiographic studies, pulse oximetry and re-evaluation of patient's condition.  The patients results and plan were reviewed and discussed.   Any x-rays performed were independently reviewed by myself.   Differential diagnosis were considered with the presenting HPI.  Medications  vancomycin (VANCOCIN) 1,500 mg in sodium chloride 0.9 % 500 mL IVPB (1,500 mg Intravenous New Bag/Given 10/31/17 1920)  piperacillin-tazobactam (ZOSYN) IVPB 3.375 g (has no administration in time range)  ipratropium-albuterol (DUONEB) 0.5-2.5 (3) MG/3ML nebulizer solution 3 mL (3 mLs Nebulization Given 10/31/17 1655)  predniSONE (DELTASONE) tablet 60 mg (60 mg Oral Given 10/31/17 1727)    Vitals:   10/31/17 1716 10/31/17 1717 10/31/17 1732 10/31/17 1733  BP: 97/69     Pulse: (!) 126     Resp: (!) 37     Temp:      TempSrc:      SpO2: (!) 81% (!) 89% 92%   Weight:    79.4 kg (175 lb)  Height:    6\' 2"  (1.88 m)    Final diagnoses:  Pleural effusion on left    Admission/ observation were discussed with the admitting physician, patient and/or family and they are comfortable with the plan.    Final Clinical Impressions(s) / ED Diagnoses   Final diagnoses:  Pleural effusion on left    ED Discharge Orders    None       Deno Etienne, DO 10/31/17 1924

## 2017-10-31 NOTE — ED Triage Notes (Signed)
Pt arrived via EMS from sickle Cell clinic. Pt has hx of anemia. Pt has been c/o SOB x6 months, that has been worse over the past few days. Pt O2 saturation was 86% on RA. Pt is not on o@ at home. Pt was given a Duo Neb en route per EMS symptoms improved. . Pt has been having pain to left lower flank, pt does reports a productive cough. Pt has some Memory deficits, caregiver is enroute.   EMS v/s   104/62 RR 22 HR 86 CBG 124 O2 100% on Neb.

## 2017-10-31 NOTE — ED Notes (Signed)
Dan Salazar [phone] or [phone] he would like a phone of anything changes/happens.

## 2017-11-01 ENCOUNTER — Inpatient Hospital Stay (HOSPITAL_COMMUNITY): Payer: Self-pay

## 2017-11-01 DIAGNOSIS — R531 Weakness: Secondary | ICD-10-CM

## 2017-11-01 DIAGNOSIS — I361 Nonrheumatic tricuspid (valve) insufficiency: Secondary | ICD-10-CM

## 2017-11-01 DIAGNOSIS — A419 Sepsis, unspecified organism: Principal | ICD-10-CM

## 2017-11-01 DIAGNOSIS — J690 Pneumonitis due to inhalation of food and vomit: Secondary | ICD-10-CM

## 2017-11-01 DIAGNOSIS — R6521 Severe sepsis with septic shock: Secondary | ICD-10-CM

## 2017-11-01 DIAGNOSIS — J90 Pleural effusion, not elsewhere classified: Secondary | ICD-10-CM

## 2017-11-01 DIAGNOSIS — R634 Abnormal weight loss: Secondary | ICD-10-CM

## 2017-11-01 LAB — GLUCOSE, CAPILLARY
GLUCOSE-CAPILLARY: 126 mg/dL — AB (ref 65–99)
GLUCOSE-CAPILLARY: 186 mg/dL — AB (ref 65–99)
Glucose-Capillary: 120 mg/dL — ABNORMAL HIGH (ref 65–99)
Glucose-Capillary: 151 mg/dL — ABNORMAL HIGH (ref 65–99)
Glucose-Capillary: 104 mg/dL — ABNORMAL HIGH (ref 65–99)

## 2017-11-01 LAB — ECHOCARDIOGRAM COMPLETE
HEIGHTINCHES: 74 in
WEIGHTICAEL: 2800 [oz_av]

## 2017-11-01 LAB — BASIC METABOLIC PANEL
Anion gap: 9 (ref 5–15)
BUN: 23 mg/dL — ABNORMAL HIGH (ref 6–20)
CO2: 24 mmol/L (ref 22–32)
Calcium: 7.7 mg/dL — ABNORMAL LOW (ref 8.9–10.3)
Chloride: 100 mmol/L — ABNORMAL LOW (ref 101–111)
Creatinine, Ser: 1.33 mg/dL — ABNORMAL HIGH (ref 0.61–1.24)
GFR calc Af Amer: >60 mL/min (ref >60)
GFR calc non Af Amer: 57 mL/min — ABNORMAL LOW (ref >60)
Glucose, Bld: 153 mg/dL — ABNORMAL HIGH (ref 65–99)
Potassium: 4.1 mmol/L (ref 3.5–5.1)
Sodium: 133 mmol/L — ABNORMAL LOW (ref 135–145)

## 2017-11-01 LAB — RESPIRATORY PANEL BY PCR
ADENOVIRUS-RVPPCR: NOT DETECTED
Bordetella pertussis: NOT DETECTED
CORONAVIRUS 229E-RVPPCR: NOT DETECTED
CORONAVIRUS HKU1-RVPPCR: NOT DETECTED
CORONAVIRUS NL63-RVPPCR: NOT DETECTED
Chlamydophila pneumoniae: NOT DETECTED
Coronavirus OC43: NOT DETECTED
INFLUENZA B-RVPPCR: NOT DETECTED
Influenza A: NOT DETECTED
MYCOPLASMA PNEUMONIAE-RVPPCR: NOT DETECTED
Metapneumovirus: NOT DETECTED
PARAINFLUENZA VIRUS 1-RVPPCR: NOT DETECTED
PARAINFLUENZA VIRUS 2-RVPPCR: NOT DETECTED
Parainfluenza Virus 3: NOT DETECTED
Parainfluenza Virus 4: NOT DETECTED
Respiratory Syncytial Virus: NOT DETECTED
Rhinovirus / Enterovirus: NOT DETECTED

## 2017-11-01 LAB — CBC WITH DIFFERENTIAL/PLATELET
BASOS PCT: 0 %
Basophils Absolute: 0 10*3/uL (ref 0.0–0.1)
EOS ABS: 0 10*3/uL (ref 0.0–0.7)
Eosinophils Relative: 0 %
HEMATOCRIT: 30.7 % — AB (ref 39.0–52.0)
HEMOGLOBIN: 10.1 g/dL — AB (ref 13.0–17.0)
Lymphocytes Relative: 2 %
Lymphs Abs: 0.4 10*3/uL — ABNORMAL LOW (ref 0.7–4.0)
MCH: 31.1 pg (ref 26.0–34.0)
MCHC: 32.9 g/dL (ref 30.0–36.0)
MCV: 94.5 fL (ref 78.0–100.0)
Monocytes Absolute: 0.7 10*3/uL (ref 0.1–1.0)
Monocytes Relative: 3 %
NEUTROS ABS: 19.2 10*3/uL — AB (ref 1.7–7.7)
NEUTROS PCT: 95 %
Platelets: 267 10*3/uL (ref 150–400)
RBC: 3.25 MIL/uL — AB (ref 4.22–5.81)
RDW: 13.8 % (ref 11.5–15.5)
WBC: 20.3 10*3/uL — AB (ref 4.0–10.5)

## 2017-11-01 LAB — LACTIC ACID, PLASMA
LACTIC ACID, VENOUS: 2.3 mmol/L — AB (ref 0.5–1.9)
Lactic Acid, Venous: 1.5 mmol/L (ref 0.5–1.9)
Lactic Acid, Venous: 1.6 mmol/L (ref 0.5–1.9)

## 2017-11-01 LAB — HIV ANTIBODY (ROUTINE TESTING W REFLEX): HIV SCREEN 4TH GENERATION: NONREACTIVE

## 2017-11-01 LAB — CORTISOL: CORTISOL PLASMA: 13.5 ug/dL

## 2017-11-01 LAB — PHOSPHORUS: Phosphorus: 4.2 mg/dL (ref 2.5–4.6)

## 2017-11-01 LAB — PROCALCITONIN: Procalcitonin: 0.93 ng/mL

## 2017-11-01 LAB — HEPATIC FUNCTION PANEL
ALT: 21 U/L (ref 17–63)
AST: 16 U/L (ref 15–41)
Albumin: 2.4 g/dL — ABNORMAL LOW (ref 3.5–5.0)
Alkaline Phosphatase: 66 U/L (ref 38–126)
Bilirubin, Direct: 0.3 mg/dL (ref 0.1–0.5)
Indirect Bilirubin: 0.7 mg/dL (ref 0.3–0.9)
Total Bilirubin: 1 mg/dL (ref 0.3–1.2)
Total Protein: 5.7 g/dL — ABNORMAL LOW (ref 6.5–8.1)

## 2017-11-01 LAB — EXPECTORATED SPUTUM ASSESSMENT W REFEX TO RESP CULTURE

## 2017-11-01 LAB — STREP PNEUMONIAE URINARY ANTIGEN: STREP PNEUMO URINARY ANTIGEN: NEGATIVE

## 2017-11-01 LAB — MRSA PCR SCREENING: MRSA by PCR: NEGATIVE

## 2017-11-01 LAB — TROPONIN I
Troponin I: 0.18 ng/mL (ref <0.03)
Troponin I: <0.03 ng/mL (ref <0.03)

## 2017-11-01 LAB — EXPECTORATED SPUTUM ASSESSMENT W GRAM STAIN, RFLX TO RESP C

## 2017-11-01 LAB — LIPID PANEL
CHOLESTEROL: 101 mg/dL (ref 0–200)
HDL: 42 mg/dL (ref >40)
LDL CALC: 49 mg/dL (ref 0–99)
Total CHOL/HDL Ratio: 2.4 RATIO
Triglycerides: 49 mg/dL (ref <150)
VLDL: 10 mg/dL (ref 0–40)

## 2017-11-01 LAB — HEMOGLOBIN A1C
Hgb A1c MFr Bld: 6.1 % — ABNORMAL HIGH (ref 4.8–5.6)
MEAN PLASMA GLUCOSE: 128.37 mg/dL

## 2017-11-01 LAB — PROTIME-INR
INR: 1.28
Prothrombin Time: 15.9 seconds — ABNORMAL HIGH (ref 11.4–15.2)

## 2017-11-01 LAB — MAGNESIUM: Magnesium: 1.8 mg/dL (ref 1.7–2.4)

## 2017-11-01 LAB — TSH: TSH: 0.511 u[IU]/mL (ref 0.350–4.500)

## 2017-11-01 MED ORDER — ORAL CARE MOUTH RINSE
15.0000 mL | Freq: Two times a day (BID) | OROMUCOSAL | Status: DC
  Administered 2017-11-01 – 2017-11-05 (×7): 15 mL via OROMUCOSAL

## 2017-11-01 MED ORDER — SODIUM CHLORIDE 0.9 % IV BOLUS (SEPSIS)
2000.0000 mL | Freq: Once | INTRAVENOUS | Status: AC
  Administered 2017-11-01: 2000 mL via INTRAVENOUS

## 2017-11-01 MED ORDER — ASPIRIN EC 81 MG PO TBEC: 81.0000 mg | DELAYED_RELEASE_TABLET | Freq: Every day | ORAL | Status: DC

## 2017-11-01 MED ORDER — IPRATROPIUM-ALBUTEROL 0.5-2.5 (3) MG/3ML IN SOLN: 3.0000 mL | RESPIRATORY_TRACT | Status: DC | PRN

## 2017-11-01 MED ORDER — ACETAMINOPHEN 325 MG PO TABS: 650.0000 mg | ORAL_TABLET | ORAL | Status: DC | PRN

## 2017-11-01 MED ORDER — INSULIN ASPART 100 UNIT/ML ~~LOC~~ SOLN
1.0000 [IU] | SUBCUTANEOUS | Status: DC
  Administered 2017-11-01: 1 [IU] via SUBCUTANEOUS
  Administered 2017-11-01 (×2): 2 [IU] via SUBCUTANEOUS
  Administered 2017-11-02 – 2017-11-04 (×3): 1 [IU] via SUBCUTANEOUS

## 2017-11-01 MED ORDER — PANTOPRAZOLE SODIUM 40 MG IV SOLR
40.0000 mg | Freq: Every day | INTRAVENOUS | Status: DC
  Administered 2017-11-01 – 2017-11-05 (×6): 40 mg via INTRAVENOUS
  Filled 2017-11-01 (×6): qty 40

## 2017-11-01 MED ORDER — PIPERACILLIN-TAZOBACTAM 3.375 G IVPB
3.3750 g | Freq: Three times a day (TID) | INTRAVENOUS | Status: DC
  Administered 2017-11-01 – 2017-11-11 (×32): 3.375 g via INTRAVENOUS
  Filled 2017-11-01 (×33): qty 50

## 2017-11-01 MED ORDER — ONDANSETRON HCL 4 MG/2ML IJ SOLN
4.0000 mg | Freq: Four times a day (QID) | INTRAMUSCULAR | Status: DC | PRN
  Administered 2017-11-03: 4 mg via INTRAVENOUS

## 2017-11-01 MED ORDER — HYDROCORTISONE NA SUCCINATE PF 100 MG IJ SOLR
50.0000 mg | Freq: Four times a day (QID) | INTRAMUSCULAR | Status: DC
  Administered 2017-11-01 – 2017-11-02 (×4): 50 mg via INTRAVENOUS
  Filled 2017-11-01 (×4): qty 2

## 2017-11-01 MED ORDER — ATORVASTATIN CALCIUM 40 MG PO TABS: 80.0000 mg | ORAL_TABLET | Freq: Every day | ORAL | Status: DC

## 2017-11-01 MED ORDER — IPRATROPIUM-ALBUTEROL 0.5-2.5 (3) MG/3ML IN SOLN
3.0000 mL | RESPIRATORY_TRACT | Status: DC
  Administered 2017-11-01 (×2): 3 mL via RESPIRATORY_TRACT
  Filled 2017-11-01 (×2): qty 3

## 2017-11-01 MED ORDER — NOREPINEPHRINE 4 MG/250ML-% IV SOLN
5.0000 ug/min | INTRAVENOUS | Status: DC
  Administered 2017-11-01: 5 ug/min via INTRAVENOUS
  Filled 2017-11-01: qty 250

## 2017-11-01 MED ORDER — SODIUM CHLORIDE 0.9 % IV SOLN
INTRAVENOUS | Status: DC
  Administered 2017-11-01: 20:00:00 via INTRAVENOUS

## 2017-11-01 MED ORDER — HEPARIN SODIUM (PORCINE) 5000 UNIT/ML IJ SOLN
5000.0000 [IU] | Freq: Three times a day (TID) | INTRAMUSCULAR | Status: DC
  Administered 2017-11-02 (×3): 5000 [IU] via SUBCUTANEOUS
  Filled 2017-11-01 (×3): qty 1

## 2017-11-01 MED ORDER — SODIUM CHLORIDE 0.9 % IV SOLN: 250.0000 mL | INTRAVENOUS | Status: DC | PRN

## 2017-11-01 NOTE — Progress Notes (Signed)
  Echocardiogram 2D Echocardiogram has been performed.  Divya Munshi L Androw 11/01/2017, 1:35 PM

## 2017-11-01 NOTE — Consult Note (Signed)
PULMONARY / CRITICAL CARE MEDICINE   Name: Dan Salazar MRN: [id number] DOB: 02-05-1961    ADMISSION DATE:  10/31/2017 CONSULTATION DATE: 11/01/17  REFERRING MD: Dr Maudie Mercury  CHIEF COMPLAINT: Hypoxia, Shock  HISTORY OF PRESENT ILLNESS:   10yoM with hx HTN, Anemia, CKD, g1 Diastolic CHF, Neversmoker, who presents to the hospital c/o 6 months of SOB, Generalized weakness, and Untintentional weight loss "quite a bit." He is unable to quantify how much weight he has lost but does report he has had a good appetite and isn't sure why he keeps losing weight. He says his SOB worsened over the past few days and now he also has a cough productive of yellow sputum. Denies F/C, CP, or Dysphagia.   On arrival to the hospital he was found to have an aspiration pneumonia and a loculated pleural effusion. He was found to have acute hypoxia requiring 4L O2. He was found to have soft BP although unclear what his baseline BP is. He was admitted to ICU under hosptialist service. He received 500cc IVF bolus. BP then began dropping from previously 90/60's to now 80/50's. He remains on 4L O2 in no respiratory distress. PCCM is now consulted.   PAST MEDICAL HISTORY :  He  has a past medical history of Dyspnea and Hypertension.  PAST SURGICAL HISTORY: He  has a past surgical history that includes Open reduction internal fixation (orif) distal radial fracture (Left, 03/02/2013); Hernia repair; laparoscopy (N/A, 04/30/2017); Inguinal hernia repair (Left, 04/30/2017); and Insertion of mesh (Left, 04/30/2017).  Allergies  Allergen Reactions  . Hctz [Hydrochlorothiazide] Other (See Comments)    Severe electrolyte abnormalities    No current facility-administered medications on file prior to encounter.    Current Outpatient Medications on File Prior to Encounter  Medication Sig  . lisinopril (PRINIVIL,ZESTRIL) 40 MG tablet Take 20 mg by mouth daily.   . tamsulosin (FLOMAX) 0.4 MG CAPS capsule Take 0.4 mg by mouth daily.  .  traMADol (ULTRAM) 50 MG tablet Take 1 tablet (50 mg total) by mouth every 6 (six) hours as needed (mild pain). (Patient not taking: Reported on 10/31/2017)   FAMILY HISTORY:  His has no family status information on file.   SOCIAL HISTORY: He  reports that he has never smoked. He has never used smokeless tobacco. He reports that he drinks alcohol. He reports that he does not use drugs.  REVIEW OF SYSTEMS:   Review of Systems  Constitutional: Positive for malaise/fatigue and weight loss. Negative for chills and fever.  HENT: Negative.   Eyes: Negative.   Respiratory: Positive for cough, sputum production and shortness of breath. Negative for wheezing.   Cardiovascular: Negative for chest pain.  Gastrointestinal: Negative.  Negative for blood in stool and melena.  Genitourinary: Negative.   Musculoskeletal: Negative.   Skin: Negative.   Neurological: Positive for weakness.  Endo/Heme/Allergies: Negative.   Psychiatric/Behavioral: Negative.    SUBJECTIVE:  Lying in ICU bed on 4L O2 in NAD  VITAL SIGNS: BP (!) 85/58   Pulse 94   Temp 98.3 F (36.8 C) (Oral)   Resp (!) 27   Ht 6\' 2"  (1.88 m)   Wt 79.4 kg (175 lb)   SpO2 96%   BMI 22.47 kg/m   INTAKE / OUTPUT: No intake/output data recorded.  PHYSICAL EXAMINATION: General: WDWN Adult male, appears older than his stated age, in NAD Neuro: Awake/alert, oriented x 3, moving all extremities but is diffusely weak, PERRL, EOMI HEENT: OP clear, MM slightly dry Cardiovascular: RRR  no m/r/g Lungs: Coarse breath sounds b/l anteriorly and laterally; decreased breath sounds left base; no accessory muscle use. No respiratory distress. Speaking in full sentences  Abdomen: Soft flat NTND, BS hypoactive Musculoskeletal: no LE edema  Skin: no rashes   LABS:  BMET Recent Labs  Lab 10/31/17 1714  NA 133*  K 4.6  CL 95*  CO2 27  BUN 18  CREATININE 1.21  GLUCOSE 126*   Electrolytes Recent Labs  Lab 10/31/17 1714  CALCIUM 8.8*    CBC Recent Labs  Lab 10/31/17 1714  WBC 23.9*  HGB 12.6*  HCT 37.5*  PLT 363   Coag's No results for input(s): APTT, INR in the last 168 hours.  Sepsis Markers No results for input(s): LATICACIDVEN, PROCALCITON, O2SATVEN in the last 168 hours.  ABG No results for input(s): PHART, PCO2ART, PO2ART in the last 168 hours.  Liver Enzymes No results for input(s): AST, ALT, ALKPHOS, BILITOT, ALBUMIN in the last 168 hours.  Cardiac Enzymes Recent Labs  Lab 10/31/17 2335  TROPONINI 0.18*   Glucose No results for input(s): GLUCAP in the last 168 hours.  Imaging Dg Chest 2 View  Result Date: 10/31/2017 CLINICAL DATA:  Worsening shortness of breath over the past 6 months. History of sickle cell anemia. EXAM: CHEST - 2 VIEW COMPARISON:  Chest x-ray dated September 28, 2016. FINDINGS: The left heart border is obscured. Moderate to large left-sided pleural effusion with consolidation/collapse of the left lower lobe and atelectasis in the lingula. The right lung is clear. No pneumothorax. No acute osseous abnormality. IMPRESSION: 1. New moderate to large left-sided pleural effusion with adjacent left lung collapse. Electronically Signed   By: Titus Dubin M.D.   On: 10/31/2017 18:32   Ct Head Wo Contrast  Result Date: 10/31/2017 CLINICAL DATA:  Altered mental status. EXAM: CT HEAD WITHOUT CONTRAST TECHNIQUE: Contiguous axial images were obtained from the base of the skull through the vertex without intravenous contrast. COMPARISON:  None. FINDINGS: Brain: No evidence of acute infarction, hemorrhage, hydrocephalus, extra-axial collection or mass lesion/mass effect. Mild generalized cerebral atrophy. Moderate periventricular and subcortical white matter hypodensities are nonspecific but favored to reflect chronic microvascular ischemic changes. 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 Vascular: Calcified atherosclerosis at the skullbase. No hyperdense vessel.  Skull: Normal. Negative for fracture or focal lesion. Sinuses/Orbits: No acute finding. Other: 1.3 cm oval lesion in the subcutaneous tissues of the left parietal scalp may represent a sebaceous cyst. IMPRESSION: 1.  No acute intracranial abnormality. 2. Mild atrophy and moderate chronic microvascular ischemic changes. Electronically Signed   By: Titus Dubin M.D.   On: 10/31/2017 21:30   Ct Angio Chest Pe W And/or Wo Contrast  Result Date: 10/31/2017 CLINICAL DATA:  Hypertension and dyspnea for 4 weeks. Cough. Shortness of breath. EXAM: CT ANGIOGRAPHY CHEST WITH CONTRAST TECHNIQUE: Multidetector CT imaging of the chest was performed using the standard protocol during bolus administration of intravenous contrast. Multiplanar CT image reconstructions and MIPs were obtained to evaluate the vascular anatomy. CONTRAST:  190mL ISOVUE-370 IOPAMIDOL (ISOVUE-370) INJECTION 76% COMPARISON:  None. FINDINGS: Cardiovascular: Good opacification of the central and segmental pulmonary arteries. No focal filling defects. No evidence of significant pulmonary embolus. Normal caliber thoracic aorta with scattered calcifications. Great vessel origins are patent. Normal heart size with small to moderate pericardial effusion. Coronary artery calcifications. Mediastinum/Nodes: Esophagus is decompressed. Small esophageal hiatal hernia. No significant lymphadenopathy in the chest. Lungs/Pleura: There is a large amount of fluid  and debris at the distal trachea involving the origin of the left mainstem bronchus with near complete filling of the left mainstem and central bronchi. This likely represents aspiration although mucous impaction or secretions could also possibly have this appearance. Obstructing endobronchial lesion could also be present. There is volume loss and consolidation of the left lower lung. Large left pleural effusion with possible loculation. Right lung is clear and expanded. No significant fluid or secretions on the  right. No pneumothorax. Upper Abdomen: No acute abnormality. Musculoskeletal: Degenerative changes in the spine. No destructive bone lesions. Review of the MIP images confirms the above findings. IMPRESSION: 1. No evidence of significant pulmonary embolus. 2. Large amount of fluid and debris filling the left mainstem bronchus and extending into the left central bronchi. This is likely due to aspiration. Mucous secretions or obstructing endobronchial lesion are other possibilities. 3. Consolidation and volume loss in the left lower lung, likely infectious or postobstructive. 4. Large left pleural effusion with possible loculation. 5. Pericardial effusion. Aortic Atherosclerosis (ICD10-I70.0). Electronically Signed   By: Lucienne Capers M.D.   On: 10/31/2017 21:37   CULTURES: Blood cultures (4/4): pending Sputum culture: ordered  ANTIBIOTICS: Cefepime 4/4 Zosyn 4/4 >> Vancomycin 4/4 >>  SIGNIFICANT EVENTS: 4./4: presented to ER c/o 6 months of SOB, Generalized weakness, Weight loss  LINES/TUBES: PIV's  DISCUSSION: 57yoM with hx HTN, Anemia, CKD, g1 Diastolic CHF, Neversmoker, who presents to the hospital c/o 6 months of SOB, Generalized weakness, and Untintentional weight loss, now with productive cough and worsening SOB for past few days, found to have Septic shock due to Aspiration pneumonia and Loculated Pleural effusion  ASSESSMENT / PLAN:  PULMONARY 1. Acute Hypoxic Respiratory Failure; Aspiration Pneumonia; Left-sided Loculated Pleural Effusion - Chest CT on my review shows a left-sided pulmonary infiltrate with considerable debris within left mainstem bronchus, consistent with an aspiration pneumonia. It also shows a large left-sided loculated pleural effusion.  - stable on 3L O2 at time of my exam with Pox 98%. Add humidification to O2.  - Start Chest PT q4hrs with vibratory vest and Duoenbs q4hrs - Incentive spirometry q1hr while awake; Flutter valve q2hrs while awake - Consider  Ultrasound guided thoracentesis in AM however the loculated appearance on CT makes it likely that it will not be able to be fully drained with thoracentesis and may require VATS once clinically more stable. No urgency for thoracentesis at this time since patient is stable on 3L O2 and in no respiratory distress. - Check INR now in preparation for thoracentesis in AM; hold DVT prophylaxis until after procedure.   CARDIOVASCULAR 1. Hx HTN; Currently in Shock: - hold home antihypertensive medications  RENAL 1. CKD: - creatinine 1.21 which is at his baseline creatinine which ranges 1.2-1.3; continue to monitor. Avoid nephrotoxic agents. Monitor UOP.   GASTROINTESTINAL No active issues; NPO; GI prophylaxis   HEMATOLOGIC 1. Hx Anemia:  - Hgb 12.6 which is at his baseline which ranges in 12's  2. Unintentional Weight loss: - patient reports "significant" unintentional weight loss over the past 6 months despite good appetite; he cannot quantify how much weight he has lost though. Very suspicious this could indicate malignancy; discussed this with him. He is a never smoker but has never had a prostate exam,  Colonoscopy, or other cancer screening tests.  - check TSH, HIV, LFT's, HCV, A1c, CEA  INFECTIOUS 1. Septic Shock due to Aspiration Pneumonia: - continue O2 and Chest PT as discussed above - Currently MAP 65 but  has only received 500cc IVF. Give another 2L IVF bolus now to complete his 30cc/kg - Trend lactate and procalcitonin; obtain blood cultures and sputum culture.  - Change Cefepime to Zosyn since concerned for possible aspiration; Continue Vancomycin - Check MRSA nares and Respiratory Viral panel - If MAP does not improve and stay above 65 after IVF's, will start Levophed.   ENDOCRINE No active issues; NPO; cover with SSI as needed  NEUROLOGIC 1. Generalized Weakness: no focal deficits on neuro exam; weakness likely due to sepsis. Defer to primary team if wish for more workup if  weakness doesn't improve with treatment of sepsis.   FAMILY  - Updates: No family present at bedside at time of my exam - Inter-disciplinary family meet or Palliative Care meeting due by: 11/07/17  60 minutes critical care time  Vernie Murders, MD  Pulmonary and [place] Pager: [phone]  11/01/2017, 1:28 AM

## 2017-11-01 NOTE — Progress Notes (Signed)
MD made aware earlier of critical troponin. New orders received. MD on floor and also made aware of critical lactic result. No new orders given. Will contiune to monitor pt.

## 2017-11-01 NOTE — Progress Notes (Signed)
Nutrition Brief Note  Patient identified on the Malnutrition Screening Tool (MST) Report  Wt Readings from Last 15 Encounters:  10/31/17 175 lb (79.4 kg)  04/30/17 182 lb (82.6 kg)  04/29/17 182 lb (82.6 kg)  09/28/16 190 lb (86.2 kg)  03/02/13 202 lb (91.6 kg)  02/04/13 183 lb (83 kg)    Body mass index is 22.47 kg/m. Patient meets criteria for normal weight based on current BMI. Per review, pt has lost 7 lbs (3.8% body weight) in the past 6 months which is not significant for time frame. Skin WDL.  Pt with hx of HTN who presented to the ED with SOB x4 weeks. He was found to have moderate to large L-sided pleural effusion with L lung collapse. He reports good appetite PTA and no difficulty chewing or swallowing and no pain or nausea with or without intakes PTA. He reports that he was losing weight PTA but unsure of time frame, the amount of weight lost, or UBW.   Diet advanced from NPO to CLD today at 10:25 AM. Medications reviewed; 50 mg Solu-cortef QID, sliding scale Novolog, 40 mg IV Protonix/day, 60 mg Deltasone x1 dose yesterday. Labs reviewed;  CBGs: 120 and 126 mg/dL today, Na: 133 mmol/L, Cl: 100 mmol/L, BUN: 23 mg/dL, creatinine: 1.33 mg/dL, Ca: 7.7 mg/dL, GFR: 57 mL/min.  IVF: NS @ 75 mL/hr.   No nutrition interventions warranted at this time. If nutrition issues arise, please consult RD.      Jarome Matin, MS, RD, LDN, [facility] Inpatient Clinical Dietitian Pager # [phone] After hours/weekend pager # [phone]

## 2017-11-01 NOTE — Progress Notes (Signed)
PULMONARY / CRITICAL CARE MEDICINE   Name: Dan Salazar MRN: [id number] DOB: October 06, 1960    ADMISSION DATE:  10/31/2017 CONSULTATION DATE:  11/01/2017  REFERRING MD:  Dr. Maudie Mercury, [facility]  CHIEF COMPLAINT:  Short of breath  HISTORY OF PRESENT ILLNESS:   57 yo male dyspnea, hypoxia, flank pain, weight loss and cough.  Found to have aspiration pneumonia and Lt pleural effusion.  PMHx of HTN, CKD 1.  SUBJECTIVE:  He wants something to eat.  VITAL SIGNS: BP 107/68   Pulse 87   Temp 97.8 F (36.6 C) (Oral)   Resp (!) 26   Ht 6\' 2"  (1.88 m)   Wt 175 lb (79.4 kg)   SpO2 90%   BMI 22.47 kg/m   INTAKE / OUTPUT: I/O last 3 completed shifts: In: 650 [IV Piggyback:650] Out: 400 [Urine:400]  PHYSICAL EXAMINATION:  General - alert Eyes - pupils reactive ENT - poor dentition Cardiac - regular, no murmur Chest - decreased BS Lt lung, scattered rhonchi Abd - soft, non tender Ext - no edema Skin - no rashes Neuro - normal strength   LABS:  BMET Recent Labs  Lab 10/31/17 1714 11/01/17 0129  NA 133* 133*  K 4.6 4.1  CL 95* 100*  CO2 27 24  BUN 18 23*  CREATININE 1.21 1.33*  GLUCOSE 126* 153*    Electrolytes Recent Labs  Lab 10/31/17 1714 11/01/17 0129  CALCIUM 8.8* 7.7*  MG  --  1.8  PHOS  --  4.2    CBC Recent Labs  Lab 10/31/17 1714 11/01/17 0129  WBC 23.9* 20.3*  HGB 12.6* 10.1*  HCT 37.5* 30.7*  PLT 363 267    Coag's Recent Labs  Lab 11/01/17 0100  INR 1.28    Sepsis Markers Recent Labs  Lab 11/01/17 0100 11/01/17 0402 11/01/17 0646  LATICACIDVEN 2.3* 1.5 1.6  PROCALCITON 0.93  --   --     ABG No results for input(s): PHART, PCO2ART, PO2ART in the last 168 hours.  Liver Enzymes Recent Labs  Lab 11/01/17 0129  AST 16  ALT 21  ALKPHOS 66  BILITOT 1.0  ALBUMIN 2.4*    Cardiac Enzymes Recent Labs  Lab 10/31/17 2335 11/01/17 0646  TROPONINI 0.18* <0.03    Glucose Recent Labs  Lab 11/01/17 0317 11/01/17 0754  GLUCAP 120*  126*    Imaging Dg Chest 2 View  Result Date: 10/31/2017 CLINICAL DATA:  Worsening shortness of breath over the past 6 months. History of sickle cell anemia. EXAM: CHEST - 2 VIEW COMPARISON:  Chest x-ray dated September 28, 2016. FINDINGS: The left heart border is obscured. Moderate to large left-sided pleural effusion with consolidation/collapse of the left lower lobe and atelectasis in the lingula. The right lung is clear. No pneumothorax. No acute osseous abnormality. IMPRESSION: 1. New moderate to large left-sided pleural effusion with adjacent left lung collapse. Electronically Signed   By: Titus Dubin M.D.   On: 10/31/2017 18:32   Ct Head Wo Contrast  Result Date: 10/31/2017 CLINICAL DATA:  Altered mental status. EXAM: CT HEAD WITHOUT CONTRAST TECHNIQUE: Contiguous axial images were obtained from the base of the skull through the vertex without intravenous contrast. COMPARISON:  None. FINDINGS: Brain: No evidence of acute infarction, hemorrhage, hydrocephalus, extra-axial collection or mass lesion/mass effect. Mild generalized cerebral atrophy. Moderate periventricular and subcortical white matter hypodensities are nonspecific but favored to reflect chronic microvascular ischemic changes. 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 Vascular: Calcified atherosclerosis  at the skullbase. No hyperdense vessel. Skull: Normal. Negative for fracture or focal lesion. Sinuses/Orbits: No acute finding. Other: 1.3 cm oval lesion in the subcutaneous tissues of the left parietal scalp may represent a sebaceous cyst. IMPRESSION: 1.  No acute intracranial abnormality. 2. Mild atrophy and moderate chronic microvascular ischemic changes. Electronically Signed   By: Titus Dubin M.D.   On: 10/31/2017 21:30   Ct Angio Chest Pe W And/or Wo Contrast  Result Date: 10/31/2017 CLINICAL DATA:  Hypertension and dyspnea for 4 weeks. Cough. Shortness of breath. EXAM: CT ANGIOGRAPHY CHEST  WITH CONTRAST TECHNIQUE: Multidetector CT imaging of the chest was performed using the standard protocol during bolus administration of intravenous contrast. Multiplanar CT image reconstructions and MIPs were obtained to evaluate the vascular anatomy. CONTRAST:  159mL ISOVUE-370 IOPAMIDOL (ISOVUE-370) INJECTION 76% COMPARISON:  None. FINDINGS: Cardiovascular: Good opacification of the central and segmental pulmonary arteries. No focal filling defects. No evidence of significant pulmonary embolus. Normal caliber thoracic aorta with scattered calcifications. Great vessel origins are patent. Normal heart size with small to moderate pericardial effusion. Coronary artery calcifications. Mediastinum/Nodes: Esophagus is decompressed. Small esophageal hiatal hernia. No significant lymphadenopathy in the chest. Lungs/Pleura: There is a large amount of fluid and debris at the distal trachea involving the origin of the left mainstem bronchus with near complete filling of the left mainstem and central bronchi. This likely represents aspiration although mucous impaction or secretions could also possibly have this appearance. Obstructing endobronchial lesion could also be present. There is volume loss and consolidation of the left lower lung. Large left pleural effusion with possible loculation. Right lung is clear and expanded. No significant fluid or secretions on the right. No pneumothorax. Upper Abdomen: No acute abnormality. Musculoskeletal: Degenerative changes in the spine. No destructive bone lesions. Review of the MIP images confirms the above findings. IMPRESSION: 1. No evidence of significant pulmonary embolus. 2. Large amount of fluid and debris filling the left mainstem bronchus and extending into the left central bronchi. This is likely due to aspiration. Mucous secretions or obstructing endobronchial lesion are other possibilities. 3. Consolidation and volume loss in the left lower lung, likely infectious or  postobstructive. 4. Large left pleural effusion with possible loculation. 5. Pericardial effusion. Aortic Atherosclerosis (ICD10-I70.0). Electronically Signed   By: Lucienne Capers M.D.   On: 10/31/2017 21:37     STUDIES:  CT angio chest 4/04 >> debris in Lt main bronchus, consolidation LLL, loculated Lt pleural effusion CT head 4/04 >> atrophy, microvascular ischemic changes  CULTURES: Blood 4/04 >> Respiratory viral panel 4/05 >>  HIV 4/05 >> Pneumococcal Ag 4/05 >> negative Legionella Ag 4/05 >>   ANTIBIOTICS: Vancomycin 4/04 >> Zosyn 4/04 >>   SIGNIFICANT EVENTS: 4/04 Admit 4/05 Consult TCTS, Transfer to [facility]  LINES/TUBES:  DISCUSSION: 57 yo with aspiration pneumonia and loculated Lt effusion.  Will likely need VATS.  Have consulted TCTS.  Will transfer to [facility].  ASSESSMENT / PLAN:  Aspiration pneumonia. Loculated Lt pleural effusion. - continue ABx - transfer to [facility] - TCTS to assess - f/u CXR  Septic shock. - pressors to keep MAP > 65 - continue IV fluids  Relative adrenal insufficiency. - cortisol 13.5 from 4/05 - add solu cortef while on pressors  Dysphagia. - speech therapy to assess  CKD 2. - monitor renal fx  Anemia of critical illness. - f/u CBC  DVT prophylaxis - SQ heparin SUP - Protonix Nutrition - clear liquids Goals of care - full code  CC  time 34 minutes  Chesley Mires, MD [place] 11/01/2017, 10:24 AM

## 2017-11-01 NOTE — Evaluation (Addendum)
Clinical/Bedside Swallow Evaluation Patient Details  Name: Dan Salazar MRN: [id number] Date of Birth: 05-02-1961  Today's Date: 11/01/2017 Time: SLP Start Time (ACUTE ONLY): 1150 SLP Stop Time (ACUTE ONLY): 1210 SLP Time Calculation (min) (ACUTE ONLY): 20 min  Past Medical History:  Past Medical History:  Diagnosis Date  . Dyspnea   . Hypertension    Past Surgical History:  Past Surgical History:  Procedure Laterality Date  . HERNIA REPAIR     Lap diagnostic inguinal hernia repar 04/30/17 Dr. Greer Pickerel  . INGUINAL HERNIA REPAIR Left 04/30/2017   Procedure: OPEN REPAIR MASSIVE INCARCERATED LEFT INGUINAL HERNIA REPAIR WITH MESH;  Surgeon: Greer Pickerel, MD;  Location: [facility];  Service: General;  Laterality: Left;  . INSERTION OF MESH Left 04/30/2017   Procedure: INSERTION OF MESH;  Surgeon: Greer Pickerel, MD;  Location: [facility];  Service: General;  Laterality: Left;  . LAPAROSCOPY N/A 04/30/2017   Procedure: LAPAROSCOPY DIAGNOSTIC;  Surgeon: Greer Pickerel, MD;  Location: [facility];  Service: General;  Laterality: N/A;  . OPEN REDUCTION INTERNAL FIXATION (ORIF) DISTAL RADIAL FRACTURE Left 03/02/2013   Procedure: OPEN REDUCTION INTERNAL FIXATION (ORIF) LEFT DISTAL RADIAL FRACTURE;  Surgeon: Jolyn Nap, MD;  Location: [place];  Service: Orthopedics;  Laterality: Left;   HPI:  57 year old male admitted 10/31/17 with SOB. PMH significant for HTN, dyspnea. MD questions aspiration pneumonia. Chest CT indicates "Large amount of fluid and debris filling the left mainstem bronchus and extending into the left central bronchi. This is likely due to aspiration." No prior history of dysphagia per pt. No prior ST intervention found.  Assessment / Plan / Recommendation Clinical Impression  Pt presents with adequate oral motor strength and function. Missing dentition, but dentures are in his room. Pt reports no history of swallowing difficulty, and RN has observed no difficulty. Pt was  provided with thin liquid, puree, and solid consistencies. No oral difficulty noted, and no overt s/s aspiration observed.  Recommend advancing diet back to regular per MD/test schedule (pt on clear liquids for possible procedure today. RN ok'd a few bites of puree/solid for BSE). Objective study may be beneficial to rule out silent aspiration if lungs do not clear in a timely manner. Pt has no high risk indicators to raise suspicion for dysphagia or tendency to aspirate, however, silent aspiration cannot be determined at bedside. ST will follow for diet tolerance assessment and education.  SLP Visit Diagnosis: Dysphagia, unspecified (R13.10)    Aspiration Risk  Mild aspiration risk    Diet Recommendation Regular;Thin liquid   Liquid Administration via: Cup;Straw Medication Administration: Whole meds with liquid Supervision: Patient able to self feed Compensations: Minimize environmental distractions;Small sips/bites;Slow rate Postural Changes: Seated upright at 90 degrees    Other  Recommendations Oral Care Recommendations: Oral care BID   Follow up Recommendations (TBD)      Frequency and Duration min 1 x/week  1 week       Prognosis Prognosis for Safe Diet Advancement: Good      Swallow Study   General Date of Onset: 10/31/17 HPI: 57 year old male admitted 10/31/17 with SOB. PMH significant for HTN, dyspnea. MD questions aspiration pneumonia. Chest CT indicates "Large amount of fluid and debris filling the left mainstem bronchus and extending into the left central bronchi. This is likely due to aspiration." Type of Study: Bedside Swallow Evaluation Previous Swallow Assessment: none Diet Prior to this Study: Thin liquids(clear liquids) Temperature Spikes Noted: No Respiratory Status: Nasal  cannula History of Recent Intubation: No Behavior/Cognition: Alert;Cooperative Oral Cavity Assessment: Within Functional Limits Oral Care Completed by SLP: No Oral Cavity - Dentition:  Missing dentition Vision: Functional for self-feeding Self-Feeding Abilities: Able to feed self Patient Positioning: Upright in bed Baseline Vocal Quality: Normal Volitional Cough: Strong Volitional Swallow: Able to elicit    Oral/Motor/Sensory Function Overall Oral Motor/Sensory Function: Within functional limits   Ice Chips Ice chips: Not tested   Thin Liquid Thin Liquid: Within functional limits Presentation: Straw    Nectar Thick Nectar Thick Liquid: Not tested   Honey Thick Honey Thick Liquid: Not tested   Puree Puree: Within functional limits Presentation: Spoon   Solid   GO   Solid: Within functional limits Presentation: [name] [name] Speech Language Pathologist [phone]  Shonna Chock 11/01/2017,12:19 PM

## 2017-11-01 NOTE — Progress Notes (Signed)
Patient is on pressor, care transferred to critical care. hospitalist will sign off.

## 2017-11-01 NOTE — Progress Notes (Signed)
Pharmacy Antibiotic Note  Dan Salazar is a 57 y.o. male admitted on 10/31/2017 with pneumonia.  Pharmacy has been consulted for vanc/cefepime dosing.  Plan: 1) Vancomycin 1500mg  IV x 1 then 1500mg  IV q24 - goal AUC 400-500 2) Zosyn 3.375 Gm IV q8h EI 3) Daily SCr  Height: 6\' 2"  (188 cm) Weight: 175 lb (79.4 kg) IBW/kg (Calculated) : 82.2  Temp (24hrs), Avg:99.1 F (37.3 C), Min:98.3 F (36.8 C), Max:99.8 F (37.7 C)  Recent Labs  Lab 10/31/17 1714  WBC 23.9*  CREATININE 1.21    Estimated Creatinine Clearance: 73.8 mL/min (by C-G formula based on SCr of 1.21 mg/dL).    Allergies  Allergen Reactions  . Hctz [Hydrochlorothiazide] Other (See Comments)    Severe electrolyte abnormalities    Thank you for allowing pharmacy to be a part of this patient's care.   Dorrene German 11/01/2017 12:54 AM

## 2017-11-02 DIAGNOSIS — J90 Pleural effusion, not elsewhere classified: Secondary | ICD-10-CM

## 2017-11-02 LAB — GLUCOSE, CAPILLARY
GLUCOSE-CAPILLARY: 98 mg/dL (ref 65–99)
Glucose-Capillary: 101 mg/dL — ABNORMAL HIGH (ref 65–99)
Glucose-Capillary: 101 mg/dL — ABNORMAL HIGH (ref 65–99)
Glucose-Capillary: 108 mg/dL — ABNORMAL HIGH (ref 65–99)
Glucose-Capillary: 111 mg/dL — ABNORMAL HIGH (ref 65–99)
Glucose-Capillary: 112 mg/dL — ABNORMAL HIGH (ref 65–99)
Glucose-Capillary: 152 mg/dL — ABNORMAL HIGH (ref 65–99)

## 2017-11-02 LAB — MAGNESIUM: Magnesium: 2 mg/dL (ref 1.7–2.4)

## 2017-11-02 LAB — BASIC METABOLIC PANEL
ANION GAP: 8 (ref 5–15)
BUN: 19 mg/dL (ref 6–20)
CO2: 25 mmol/L (ref 22–32)
CREATININE: 1.08 mg/dL (ref 0.61–1.24)
Calcium: 8.3 mg/dL — ABNORMAL LOW (ref 8.9–10.3)
Chloride: 104 mmol/L (ref 101–111)
GLUCOSE: 113 mg/dL — AB (ref 65–99)
Potassium: 4.2 mmol/L (ref 3.5–5.1)
Sodium: 137 mmol/L (ref 135–145)

## 2017-11-02 LAB — CBC WITH DIFFERENTIAL/PLATELET
BASOS ABS: 0 10*3/uL (ref 0.0–0.1)
BASOS PCT: 0 %
Eosinophils Absolute: 0 10*3/uL (ref 0.0–0.7)
Eosinophils Relative: 0 %
HCT: 31.1 % — ABNORMAL LOW (ref 39.0–52.0)
Hemoglobin: 10.1 g/dL — ABNORMAL LOW (ref 13.0–17.0)
Lymphocytes Relative: 3 %
Lymphs Abs: 0.6 10*3/uL — ABNORMAL LOW (ref 0.7–4.0)
MCH: 30.6 pg (ref 26.0–34.0)
MCHC: 32.5 g/dL (ref 30.0–36.0)
MCV: 94.2 fL (ref 78.0–100.0)
MONO ABS: 0.9 10*3/uL (ref 0.1–1.0)
MONOS PCT: 5 %
Neutro Abs: 17 10*3/uL — ABNORMAL HIGH (ref 1.7–7.7)
Neutrophils Relative %: 92 %
PLATELETS: 315 10*3/uL (ref 150–400)
RBC: 3.3 MIL/uL — ABNORMAL LOW (ref 4.22–5.81)
RDW: 14.1 % (ref 11.5–15.5)
WBC: 18.5 10*3/uL — ABNORMAL HIGH (ref 4.0–10.5)

## 2017-11-02 LAB — LEGIONELLA PNEUMOPHILA SEROGP 1 UR AG: L. pneumophila Serogp 1 Ur Ag: NEGATIVE

## 2017-11-02 LAB — HEPATITIS C ANTIBODY

## 2017-11-02 LAB — PROCALCITONIN: PROCALCITONIN: 0.66 ng/mL

## 2017-11-02 LAB — CEA: CEA1: 4.1 ng/mL (ref 0.0–4.7)

## 2017-11-02 MED ORDER — VANCOMYCIN HCL IN DEXTROSE 1-5 GM/200ML-% IV SOLN
1000.0000 mg | Freq: Two times a day (BID) | INTRAVENOUS | Status: DC
  Administered 2017-11-02 – 2017-11-08 (×12): 1000 mg via INTRAVENOUS
  Filled 2017-11-02 (×13): qty 200

## 2017-11-02 MED ORDER — HYDROCORTISONE NA SUCCINATE PF 100 MG IJ SOLR
50.0000 mg | Freq: Two times a day (BID) | INTRAMUSCULAR | Status: DC
  Administered 2017-11-02 – 2017-11-03 (×2): 50 mg via INTRAVENOUS
  Filled 2017-11-02 (×2): qty 2

## 2017-11-02 NOTE — Consult Note (Signed)
Reason for Consult: Loculated left pleural effusion Referring Physician: Dr. Owens Shark Dan Salazar is an 57 y.o. male.  HPI: 57 yo man presented with a cc/o of several months of SOB and left sided pleuritic CP.  Dan Salazar is a 57 yo man with a past history of hypertension who c/o of shortness of breath for the past several months.  He also has been having left sided chest pain recently. Worse with cough. He denies any fevers or chills. He went to a doctor on 4/4 and says he was sent straight to the ED. He was found to have a loculated left pleural effusion and occlusion of the left main stem bronchus. Radiology felt bronchial obstruction likely due to debris, mucous rather than tumor.  He was admitted and started on IV antibiotics. He feels better since admission but still has a frequent productive cough with yellow sputum. Transferred to [facility] for VATS.  Past Medical History:  Diagnosis Date  . Dyspnea   . Hypertension     Past Surgical History:  Procedure Laterality Date  . HERNIA REPAIR     Lap diagnostic inguinal hernia repar 04/30/17 Dr. Greer Pickerel  . INGUINAL HERNIA REPAIR Left 04/30/2017   Procedure: OPEN REPAIR MASSIVE INCARCERATED LEFT INGUINAL HERNIA REPAIR WITH MESH;  Surgeon: Greer Pickerel, MD;  Location: [facility];  Service: General;  Laterality: Left;  . INSERTION OF MESH Left 04/30/2017   Procedure: INSERTION OF MESH;  Surgeon: Greer Pickerel, MD;  Location: [facility];  Service: General;  Laterality: Left;  . LAPAROSCOPY N/A 04/30/2017   Procedure: LAPAROSCOPY DIAGNOSTIC;  Surgeon: Greer Pickerel, MD;  Location: [facility];  Service: General;  Laterality: N/A;  . OPEN REDUCTION INTERNAL FIXATION (ORIF) DISTAL RADIAL FRACTURE Left 03/02/2013   Procedure: OPEN REDUCTION INTERNAL FIXATION (ORIF) LEFT DISTAL RADIAL FRACTURE;  Surgeon: Jolyn Nap, MD;  Location: [facility];  Service: Orthopedics;  Laterality: Left;    History reviewed. No pertinent family history.  Social  History:  reports that he has never smoked. He has never used smokeless tobacco. He reports that he drinks alcohol. He reports that he does not use drugs.  Allergies:  Allergies  Allergen Reactions  . Hctz [Hydrochlorothiazide] Other (See Comments)    Severe electrolyte abnormalities    Medications:  Scheduled: . heparin injection (subcutaneous)  5,000 Units Subcutaneous Q8H  . hydrocortisone sod succinate (SOLU-CORTEF) inj  50 mg Intravenous Q12H  . insulin aspart  1-3 Units Subcutaneous Q4H  . mouth rinse  15 mL Mouth Rinse BID  . pantoprazole (PROTONIX) IV  40 mg Intravenous QHS    Results for orders placed or performed during the hospital encounter of 10/31/17 (from the past 48 hour(s))  CBC with Differential     Status: Abnormal   Collection Time: 10/31/17  5:14 PM  Result Value Ref Range   WBC 23.9 (H) 4.0 - 10.5 K/uL   RBC 3.98 (L) 4.22 - 5.81 MIL/uL   Hemoglobin 12.6 (L) 13.0 - 17.0 g/dL   HCT 37.5 (L) 39.0 - 52.0 %   MCV 94.2 78.0 - 100.0 fL   MCH 31.7 26.0 - 34.0 pg   MCHC 33.6 30.0 - 36.0 g/dL   RDW 13.6 11.5 - 15.5 %   Platelets 363 150 - 400 K/uL   Neutrophils Relative % 92 %   Neutro Abs 22.0 (H) 1.7 - 7.7 K/uL   Lymphocytes Relative 4 %   Lymphs Abs 0.9 0.7 - 4.0 K/uL   Monocytes  Relative 4 %   Monocytes Absolute 1.0 0.1 - 1.0 K/uL   Eosinophils Relative 0 %   Eosinophils Absolute 0.0 0.0 - 0.7 K/uL   Basophils Relative 0 %   Basophils Absolute 0.0 0.0 - 0.1 K/uL    Comment: Performed at [facility], [place] [address] [place], [place] [address]  Basic metabolic panel     Status: Abnormal   Collection Time: 10/31/17  5:14 PM  Result Value Ref Range   Sodium 133 (L) 135 - 145 mmol/L   Potassium 4.6 3.5 - 5.1 mmol/L   Chloride 95 (L) 101 - 111 mmol/L   CO2 27 22 - 32 mmol/L   Glucose, Bld 126 (H) 65 - 99 mg/dL   BUN 18 6 - 20 mg/dL   Creatinine, Ser 1.21 0.61 - 1.24 mg/dL   Calcium 8.8 (L) 8.9 - 10.3 mg/dL   GFR calc non Af Amer >60  >60 mL/min   GFR calc Af Amer >60 >60 mL/min    Comment: (NOTE) The eGFR has been calculated using the CKD EPI equation. This calculation has not been validated in all clinical situations. eGFR's persistently <60 mL/min signify possible Chronic Kidney Disease.    Anion gap 11 5 - 15    Comment: Performed at [facility], [place] [address], [place], [place] [address]  Blood gas, venous     Status: Abnormal   Collection Time: 10/31/17  5:20 PM  Result Value Ref Range   O2 Content 3.0 L/min   Delivery systems NASAL CANNULA    pH, Ven 7.399 7.250 - 7.430   pCO2, Ven 44.6 44.0 - 60.0 mmHg   pO2, Ven below reportable range 32.0 - 45.0 mmHg    Comment: RBV DAN FLOYD,MD AT 1732 BY KENNAN DEPUE,RRT,RCP ON 10/31/2017   Bicarbonate 27.0 20.0 - 28.0 mmol/L   Acid-Base Excess 2.3 (H) 0.0 - 2.0 mmol/L   O2 Saturation 36.3 %   Patient temperature 98.6    Collection site VEIN    Drawn by [phone]    Sample type VEIN     Comment: Performed at [facility], [place] [address] [place], [place] [address]  I-stat troponin, ED     Status: None   Collection Time: 10/31/17  5:22 PM  Result Value Ref Range   Troponin i, poc 0.00 0.00 - 0.08 ng/mL   Comment 3            Comment: Due to the release kinetics of cTnI, a negative result within the first hours of the onset of symptoms does not rule out myocardial infarction with certainty. If myocardial infarction is still suspected, repeat the test at appropriate intervals.   MRSA PCR Screening     Status: None   Collection Time: 10/31/17 11:03 PM  Result Value Ref Range   MRSA by PCR NEGATIVE NEGATIVE    Comment:        The GeneXpert MRSA Assay (FDA approved for NASAL specimens only), is one component of a comprehensive MRSA colonization surveillance program. It is not intended to diagnose MRSA infection nor to guide or monitor treatment for MRSA infections. Performed at [facility], [place]  [address] [place], [place] [address]   Culture, blood (routine x 2) Call MD if unable to obtain prior to antibiotics being given     Status: None (Preliminary result)   Collection Time: 10/31/17 11:35 PM  Result Value Ref Range   Specimen Description  BLOOD LEFT ANTECUBITAL Performed at [place] [address] [place], [place] [address]    Special Requests      BOTTLES DRAWN AEROBIC AND ANAEROBIC Blood Culture adequate volume Performed at [place] [address] [place], [place] [address]    Culture      NO GROWTH 1 DAY Performed at [facility] [address] [place], [place] [address]    Report Status PENDING   Culture, blood (routine x 2) Call MD if unable to obtain prior to antibiotics being given     Status: None (Preliminary result)   Collection Time: 10/31/17 11:35 PM  Result Value Ref Range   Specimen Description      BLOOD LEFT HAND Performed at [place] [address], [place], [place] [address]    Special Requests      BOTTLES DRAWN AEROBIC AND ANAEROBIC Blood Culture adequate volume Performed at [place] [address], [place], [place] [address]    Culture      NO GROWTH 1 DAY Performed at [place] [address] [place], [place] [address]    Report Status PENDING   HIV antibody (Routine Screening)     Status: None   Collection Time: 10/31/17 11:35 PM  Result Value Ref Range   HIV Screen 4th Generation wRfx Non Reactive Non Reactive    Comment: (NOTE) Performed At: [facility] [address] [place], [place] [id number] Rush Farmer MD [id number] Performed at [facility], [place] [address], [place], [place] [address]   Troponin I (q 6hr x 3)     Status: Abnormal   Collection Time: 10/31/17 11:35 PM  Result Value Ref Range   Troponin I 0.18 (HH) <0.03 ng/mL    Comment: CRITICAL RESULT CALLED TO, READ BACK BY  AND VERIFIED WITHMellody Drown RN [phone] 11/01/17 A NAVARRO Performed at [facility], [place] [address], [place], [place] [address]   Cortisol     Status: None   Collection Time: 11/01/17  1:00 AM  Result Value Ref Range   Cortisol, Plasma 13.5 ug/dL    Comment: (NOTE) AM    6.7 - 22.6 ug/dL PM   <10.0       ug/dL Performed at [place] [address], [place], [place] [address]   Lactic acid, plasma     Status: Abnormal   Collection Time: 11/01/17  1:00 AM  Result Value Ref Range   Lactic Acid, Venous 2.3 (HH) 0.5 - 1.9 mmol/L    Comment: CRITICAL RESULT CALLED TO, READ BACK BY AND VERIFIED WITHMellody Drown RN [phone] 11/01/17 A NAVARRO Performed at [facility], [place] [address] [place], [place] [address]   Procalcitonin - Baseline     Status: None   Collection Time: 11/01/17  1:00 AM  Result Value Ref Range   Procalcitonin 0.93 ng/mL    Comment:        Interpretation: PCT > 0.5 ng/mL and <= 2 ng/mL: Systemic infection (sepsis) is possible, but other conditions are known to elevate PCT as well. (NOTE)       Sepsis PCT Algorithm           Lower Respiratory Tract  Infection PCT Algorithm    ----------------------------     ----------------------------         PCT < 0.25 ng/mL                PCT < 0.10 ng/mL         Strongly encourage             Strongly discourage   discontinuation of antibiotics    initiation of antibiotics    ----------------------------     -----------------------------       PCT 0.25 - 0.50 ng/mL            PCT 0.10 - 0.25 ng/mL               OR       >80% decrease in PCT            Discourage initiation of                                            antibiotics      Encourage discontinuation           of antibiotics    ----------------------------     -----------------------------         PCT >= 0.50 ng/mL              PCT 0.26 - 0.50 ng/mL                AND       <80%  decrease in PCT             Encourage initiation of                                             antibiotics       Encourage continuation           of antibiotics    ----------------------------     -----------------------------        PCT >= 0.50 ng/mL                  PCT > 0.50 ng/mL               AND         increase in PCT                  Strongly encourage                                      initiation of antibiotics    Strongly encourage escalation           of antibiotics                                     -----------------------------                                           PCT <= 0.25 ng/mL  OR                                        > 80% decrease in PCT                                     Discontinue / Do not initiate                                             antibiotics Performed at [place] [address], [place], [place] [address]   Protime-INR     Status: Abnormal   Collection Time: 11/01/17  1:00 AM  Result Value Ref Range   Prothrombin Time 15.9 (H) 11.4 - 15.2 seconds   INR 1.28     Comment: Performed at [facility], [place] [address] [place], [place] [address]  TSH     Status: None   Collection Time: 11/01/17  1:29 AM  Result Value Ref Range   TSH 0.511 0.350 - 4.500 uIU/mL    Comment: Performed by a 3rd Generation assay with a functional sensitivity of <=0.01 uIU/mL. Performed at [facility], [place] [address] [place], [place] [address]   CEA     Status: None   Collection Time: 11/01/17  1:29 AM  Result Value Ref Range   CEA 4.1 0.0 - 4.7 ng/mL    Comment: (NOTE)                             Nonsmokers          <3.9                             Smokers             <5.6 Roche [employer]ostics Electrochemiluminescence Immunoassay (ECLIA) Values obtained with different assay methods or kits cannot be used interchangeably.  Results cannot  be interpreted as absolute evidence of the presence or absence of malignant disease. Performed At: [facility] [place], [place] [id number] Rush Farmer MD [id number] Performed at [facility], [place] [address], [place], [place] [address]   Hemoglobin A1c     Status: Abnormal   Collection Time: 11/01/17  1:29 AM  Result Value Ref Range   Hgb A1c MFr Bld 6.1 (H) 4.8 - 5.6 %    Comment: (NOTE) Pre diabetes:          5.7%-6.4% Diabetes:              >6.4% Glycemic control for   <7.0% adults with diabetes    Mean Plasma Glucose 128.37 mg/dL    Comment: Performed at [place] [address], [place],  [address]  Hepatitis C antibody     Status: None   Collection Time: 11/01/17  1:29 AM  Result Value Ref Range   HCV Ab <0.1 0.0 - 0.9 s/co ratio    Comment: (NOTE)  Negative:     < 0.8                             Indeterminate: 0.8 - 0.9                                  Positive:     > 0.9 The CDC recommends that a positive HCV antibody result be followed up with a HCV Nucleic Acid Amplification test (650354). Performed At: [facility] [place], [place] [id number] Rush Farmer MD [id number] Performed at [facility], [place] [address], [place], [place] [address]   Hepatic function panel     Status: Abnormal   Collection Time: 11/01/17  1:29 AM  Result Value Ref Range   Total Protein 5.7 (L) 6.5 - 8.1 g/dL   Albumin 2.4 (L) 3.5 - 5.0 g/dL   AST 16 15 - 41 U/L   ALT 21 17 - 63 U/L   Alkaline Phosphatase 66 38 - 126 U/L   Total Bilirubin 1.0 0.3 - 1.2 mg/dL   Bilirubin, Direct 0.3 0.1 - 0.5 mg/dL   Indirect Bilirubin 0.7 0.3 - 0.9 mg/dL    Comment: Performed at [facility], [place] [address] [place], [place] [address]  Basic metabolic panel     Status: Abnormal   Collection Time: 11/01/17  1:29 AM  Result Value Ref Range    Sodium 133 (L) 135 - 145 mmol/L   Potassium 4.1 3.5 - 5.1 mmol/L   Chloride 100 (L) 101 - 111 mmol/L   CO2 24 22 - 32 mmol/L   Glucose, Bld 153 (H) 65 - 99 mg/dL   BUN 23 (H) 6 - 20 mg/dL   Creatinine, Ser 1.33 (H) 0.61 - 1.24 mg/dL   Calcium 7.7 (L) 8.9 - 10.3 mg/dL   GFR calc non Af Amer 57 (L) >60 mL/min   GFR calc Af Amer >60 >60 mL/min    Comment: (NOTE) The eGFR has been calculated using the CKD EPI equation. This calculation has not been validated in all clinical situations. eGFR's persistently <60 mL/min signify possible Chronic Kidney Disease.    Anion gap 9 5 - 15    Comment: Performed at [facility], [place] [address], [place], [place] [address]  Magnesium     Status: None   Collection Time: 11/01/17  1:29 AM  Result Value Ref Range   Magnesium 1.8 1.7 - 2.4 mg/dL    Comment: Performed at [facility], [place] [address] [place], [place] [address]  Phosphorus     Status: None   Collection Time: 11/01/17  1:29 AM  Result Value Ref Range   Phosphorus 4.2 2.5 - 4.6 mg/dL    Comment: Performed at [facility], [place] [address], [place], [place] [address]  CBC with Differential/Platelet     Status: Abnormal   Collection Time: 11/01/17  1:29 AM  Result Value Ref Range   WBC 20.3 (H) 4.0 - 10.5 K/uL   RBC 3.25 (L) 4.22 - 5.81 MIL/uL   Hemoglobin 10.1 (L) 13.0 - 17.0 g/dL   HCT 30.7 (L) 39.0 - 52.0 %   MCV 94.5 78.0 - 100.0 fL   MCH 31.1 26.0 - 34.0 pg   MCHC 32.9 30.0 - 36.0 g/dL   RDW 13.8 11.5 - 15.5 %  Platelets 267 150 - 400 K/uL   Neutrophils Relative % 95 %   Neutro Abs 19.2 (H) 1.7 - 7.7 K/uL   Lymphocytes Relative 2 %   Lymphs Abs 0.4 (L) 0.7 - 4.0 K/uL   Monocytes Relative 3 %   Monocytes Absolute 0.7 0.1 - 1.0 K/uL   Eosinophils Relative 0 %   Eosinophils Absolute 0.0 0.0 - 0.7 K/uL   Basophils Relative 0 %   Basophils Absolute 0.0 0.0 - 0.1 K/uL    Comment: Performed at [facility], [place] [address], [place], [place] [address]  Respiratory Panel by PCR     Status: None   Collection Time: 11/01/17  1:30 AM  Result Value Ref Range   Adenovirus NOT DETECTED NOT DETECTED   Coronavirus 229E NOT DETECTED NOT DETECTED   Coronavirus HKU1 NOT DETECTED NOT DETECTED   Coronavirus NL63 NOT DETECTED NOT DETECTED   Coronavirus OC43 NOT DETECTED NOT DETECTED   Metapneumovirus NOT DETECTED NOT DETECTED   Rhinovirus / Enterovirus NOT DETECTED NOT DETECTED   Influenza A NOT DETECTED NOT DETECTED   Influenza B NOT DETECTED NOT DETECTED   Parainfluenza Virus 1 NOT DETECTED NOT DETECTED   Parainfluenza Virus 2 NOT DETECTED NOT DETECTED   Parainfluenza Virus 3 NOT DETECTED NOT DETECTED   Parainfluenza Virus 4 NOT DETECTED NOT DETECTED   Respiratory Syncytial Virus NOT DETECTED NOT DETECTED   Bordetella pertussis NOT DETECTED NOT DETECTED   Chlamydophila pneumoniae NOT DETECTED NOT DETECTED   Mycoplasma pneumoniae NOT DETECTED NOT DETECTED    Comment: Performed at [place] [address] [place], [place] [address]  Lipid panel     Status: None   Collection Time: 11/01/17  1:50 AM  Result Value Ref Range   Cholesterol 101 0 - 200 mg/dL   Triglycerides 49 <150 mg/dL   HDL 42 >40 mg/dL   Total CHOL/HDL Ratio 2.4 RATIO   VLDL 10 0 - 40 mg/dL   LDL Cholesterol 49 0 - 99 mg/dL    Comment:        Total Cholesterol/HDL:CHD Risk Coronary Heart Disease Risk Table                     Men   Women  1/2 Average Risk   3.4   3.3  Average Risk       5.0   4.4  2 X Average Risk   9.6   7.1  3 X Average Risk  23.4   11.0        Use the calculated Patient Ratio above and the CHD Risk Table to determine the patient's CHD Risk.        ATP III CLASSIFICATION (LDL):  <100     mg/dL   Optimal  100-129  mg/dL   Near or Above                    Optimal  130-159  mg/dL   Borderline  160-189  mg/dL   High  >190     mg/dL   Very High Performed at [place] [address], [place], [place] [address]   Glucose, capillary     Status: Abnormal   Collection Time: 11/01/17  3:17 AM  Result Value Ref Range   Glucose-Capillary 120 (H) 65 - 99 mg/dL  Lactic acid, plasma     Status: None   Collection Time: 11/01/17  4:02 AM  Result Value Ref Range   Lactic Acid, Venous 1.5 0.5 - 1.9 mmol/L    Comment: Performed at [facility], [place] [address] [place], [place] [address]  Strep pneumoniae urinary antigen     Status: None   Collection Time: 11/01/17  6:30 AM  Result Value Ref Range   Strep Pneumo Urinary Antigen NEGATIVE NEGATIVE    Comment:        Infection due to S. pneumoniae cannot be absolutely ruled out since the antigen present may be below the detection limit of the test.   Lactic acid, plasma     Status: None   Collection Time: 11/01/17  6:46 AM  Result Value Ref Range   Lactic Acid, Venous 1.6 0.5 - 1.9 mmol/L    Comment: Performed at [facility], [place] [address] [place], [place] [address]  Troponin I (q 6hr x 3)     Status: None   Collection Time: 11/01/17  6:46 AM  Result Value Ref Range   Troponin I <0.03 <0.03 ng/mL    Comment: Performed at [facility], [place] [address], [place], [place] [address]  Glucose, capillary     Status: Abnormal   Collection Time: 11/01/17  7:54 AM  Result Value Ref Range   Glucose-Capillary 126 (H) 65 - 99 mg/dL  Culture, expectorated sputum-assessment     Status: None   Collection Time: 11/01/17 10:34 AM  Result Value Ref Range   Specimen Description SPUTUM    Special Requests NONE    Sputum evaluation      THIS SPECIMEN IS ACCEPTABLE FOR SPUTUM CULTURE Performed at [facility], [place] [address] [place], [place] [address]    Report Status 11/01/2017 FINAL   Culture, respiratory (NON-Expectorated)     Status: None (Preliminary result)   Collection Time: 11/01/17 10:34 AM  Result Value Ref Range   Specimen  Description      SPUTUM Performed at [place] [address], [place], [place] [address]    Special Requests      NONE Reflexed from [id number] Performed at [facility], [place] [address], [place], [place] [address]    Gram Stain      ABUNDANT WBC PRESENT, PREDOMINANTLY PMN NO ORGANISMS SEEN    Culture      CULTURE REINCUBATED FOR BETTER GROWTH Performed at [facility], [place] [address], [place], [place] [address]    Report Status PENDING   Glucose, capillary     Status: Abnormal   Collection Time: 11/01/17 12:07 PM  Result Value Ref Range   Glucose-Capillary 151 (H) 65 - 99 mg/dL   Comment 1 Notify RN    Comment 2 Document in Chart   Glucose, capillary     Status: Abnormal   Collection Time: 11/01/17  4:11 PM  Result Value Ref Range   Glucose-Capillary 104 (H) 65 - 99 mg/dL   Comment 1 Notify RN    Comment 2 Document in Chart   Glucose, capillary     Status: Abnormal   Collection Time: 11/01/17  7:10 PM  Result Value Ref Range   Glucose-Capillary 186 (H) 65 - 99 mg/dL   Comment 1 Notify RN    Comment 2 Document in Chart   Glucose, capillary     Status: Abnormal   Collection Time: 11/02/17 12:21 AM  Result Value Ref Range   Glucose-Capillary 101 (H) 65 - 99 mg/dL  Procalcitonin  Status: None   Collection Time: 11/02/17  3:23 AM  Result Value Ref Range   Procalcitonin 0.66 ng/mL    Comment:        Interpretation: PCT > 0.5 ng/mL and <= 2 ng/mL: Systemic infection (sepsis) is possible, but other conditions are known to elevate PCT as well. (NOTE)       Sepsis PCT Algorithm           Lower Respiratory Tract                                      Infection PCT Algorithm    ----------------------------     ----------------------------         PCT < 0.25 ng/mL                PCT < 0.10 ng/mL         Strongly encourage             Strongly discourage   discontinuation of antibiotics    initiation of antibiotics     ----------------------------     -----------------------------       PCT 0.25 - 0.50 ng/mL            PCT 0.10 - 0.25 ng/mL               OR       >80% decrease in PCT            Discourage initiation of                                            antibiotics      Encourage discontinuation           of antibiotics    ----------------------------     -----------------------------         PCT >= 0.50 ng/mL              PCT 0.26 - 0.50 ng/mL                AND       <80% decrease in PCT             Encourage initiation of                                             antibiotics       Encourage continuation           of antibiotics    ----------------------------     -----------------------------        PCT >= 0.50 ng/mL                  PCT > 0.50 ng/mL               AND         increase in PCT                  Strongly encourage  initiation of antibiotics    Strongly encourage escalation           of antibiotics                                     -----------------------------                                           PCT <= 0.25 ng/mL                                                 OR                                        > 80% decrease in PCT                                     Discontinue / Do not initiate                                             antibiotics Performed at [place] [address] [place], [place] [address]   CBC with Differential/Platelet     Status: Abnormal   Collection Time: 11/02/17  3:23 AM  Result Value Ref Range   WBC 18.5 (H) 4.0 - 10.5 K/uL   RBC 3.30 (L) 4.22 - 5.81 MIL/uL   Hemoglobin 10.1 (L) 13.0 - 17.0 g/dL   HCT 31.1 (L) 39.0 - 52.0 %   MCV 94.2 78.0 - 100.0 fL   MCH 30.6 26.0 - 34.0 pg   MCHC 32.5 30.0 - 36.0 g/dL   RDW 14.1 11.5 - 15.5 %   Platelets 315 150 - 400 K/uL   Neutrophils Relative % 92 %   Neutro Abs 17.0 (H) 1.7 - 7.7 K/uL   Lymphocytes Relative 3 %   Lymphs Abs 0.6  (L) 0.7 - 4.0 K/uL   Monocytes Relative 5 %   Monocytes Absolute 0.9 0.1 - 1.0 K/uL   Eosinophils Relative 0 %   Eosinophils Absolute 0.0 0.0 - 0.7 K/uL   Basophils Relative 0 %   Basophils Absolute 0.0 0.0 - 0.1 K/uL    Comment: Performed at [facility], [place] [address], [place], [place] [address]  Basic metabolic panel     Status: Abnormal   Collection Time: 11/02/17  3:23 AM  Result Value Ref Range   Sodium 137 135 - 145 mmol/L   Potassium 4.2 3.5 - 5.1 mmol/L   Chloride 104 101 - 111 mmol/L   CO2 25 22 - 32 mmol/L   Glucose, Bld 113 (H) 65 - 99 mg/dL   BUN 19 6 - 20 mg/dL   Creatinine, Ser 1.08 0.61 - 1.24 mg/dL   Calcium 8.3 (L) 8.9 - 10.3 mg/dL   GFR calc non Af Amer >60 >60 mL/min   GFR calc Af Amer >60 >  60 mL/min    Comment: (NOTE) The eGFR has been calculated using the CKD EPI equation. This calculation has not been validated in all clinical situations. eGFR's persistently <60 mL/min signify possible Chronic Kidney Disease.    Anion gap 8 5 - 15    Comment: Performed at [facility], [place] [address] [place], [place] [address]  Magnesium     Status: None   Collection Time: 11/02/17  3:23 AM  Result Value Ref Range   Magnesium 2.0 1.7 - 2.4 mg/dL    Comment: Performed at [facility], [place] [address] [place], [place] [address]  Glucose, capillary     Status: Abnormal   Collection Time: 11/02/17  5:23 AM  Result Value Ref Range   Glucose-Capillary 108 (H) 65 - 99 mg/dL   Comment 1 Notify RN    Comment 2 Document in Chart   Glucose, capillary     Status: Abnormal   Collection Time: 11/02/17  7:40 AM  Result Value Ref Range   Glucose-Capillary 112 (H) 65 - 99 mg/dL  Glucose, capillary     Status: Abnormal   Collection Time: 11/02/17  8:41 AM  Result Value Ref Range   Glucose-Capillary 101 (H) 65 - 99 mg/dL  Glucose, capillary     Status: None   Collection Time: 11/02/17 11:40 AM  Result Value Ref Range    Glucose-Capillary 98 65 - 99 mg/dL    Dg Chest 2 View  Result Date: 10/31/2017 CLINICAL DATA:  Worsening shortness of breath over the past 6 months. History of sickle cell anemia. EXAM: CHEST - 2 VIEW COMPARISON:  Chest x-ray dated September 28, 2016. FINDINGS: The left heart border is obscured. Moderate to large left-sided pleural effusion with consolidation/collapse of the left lower lobe and atelectasis in the lingula. The right lung is clear. No pneumothorax. No acute osseous abnormality. IMPRESSION: 1. New moderate to large left-sided pleural effusion with adjacent left lung collapse. Electronically Signed   By: Titus Dubin M.D.   On: 10/31/2017 18:32   Ct Head Wo Contrast  Result Date: 10/31/2017 CLINICAL DATA:  Altered mental status. EXAM: CT HEAD WITHOUT CONTRAST TECHNIQUE: Contiguous axial images were obtained from the base of the skull through the vertex without intravenous contrast. COMPARISON:  None. FINDINGS: Brain: No evidence of acute infarction, hemorrhage, hydrocephalus, extra-axial collection or mass lesion/mass effect. Mild generalized cerebral atrophy. Moderate periventricular and subcortical white matter hypodensities are nonspecific but favored to reflect chronic microvascular ischemic changes. 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 Vascular: Calcified atherosclerosis at the skullbase. No hyperdense vessel. Skull: Normal. Negative for fracture or focal lesion. Sinuses/Orbits: No acute finding. Other: 1.3 cm oval lesion in the subcutaneous tissues of the left parietal scalp may represent a sebaceous cyst. IMPRESSION: 1.  No acute intracranial abnormality. 2. Mild atrophy and moderate chronic microvascular ischemic changes. Electronically Signed   By: Titus Dubin M.D.   On: 10/31/2017 21:30   Ct Angio Chest Pe W And/or Wo Contrast  Result Date: 10/31/2017 CLINICAL DATA:  Hypertension and dyspnea for 4 weeks. Cough. Shortness of breath. EXAM: CT  ANGIOGRAPHY CHEST WITH CONTRAST TECHNIQUE: Multidetector CT imaging of the chest was performed using the standard protocol during bolus administration of intravenous contrast. Multiplanar CT image reconstructions and MIPs were obtained to evaluate the vascular anatomy. CONTRAST:  132m ISOVUE-370 IOPAMIDOL (ISOVUE-370) INJECTION 76% COMPARISON:  None. FINDINGS: Cardiovascular: Good opacification of the central and segmental pulmonary arteries. No  focal filling defects. No evidence of significant pulmonary embolus. Normal caliber thoracic aorta with scattered calcifications. Great vessel origins are patent. Normal heart size with small to moderate pericardial effusion. Coronary artery calcifications. Mediastinum/Nodes: Esophagus is decompressed. Small esophageal hiatal hernia. No significant lymphadenopathy in the chest. Lungs/Pleura: There is a large amount of fluid and debris at the distal trachea involving the origin of the left mainstem bronchus with near complete filling of the left mainstem and central bronchi. This likely represents aspiration although mucous impaction or secretions could also possibly have this appearance. Obstructing endobronchial lesion could also be present. There is volume loss and consolidation of the left lower lung. Large left pleural effusion with possible loculation. Right lung is clear and expanded. No significant fluid or secretions on the right. No pneumothorax. Upper Abdomen: No acute abnormality. Musculoskeletal: Degenerative changes in the spine. No destructive bone lesions. Review of the MIP images confirms the above findings. IMPRESSION: 1. No evidence of significant pulmonary embolus. 2. Large amount of fluid and debris filling the left mainstem bronchus and extending into the left central bronchi. This is likely due to aspiration. Mucous secretions or obstructing endobronchial lesion are other possibilities. 3. Consolidation and volume loss in the left lower lung, likely  infectious or postobstructive. 4. Large left pleural effusion with possible loculation. 5. Pericardial effusion. Aortic Atherosclerosis (ICD10-I70.0). Electronically Signed   By: Lucienne Capers M.D.   On: 10/31/2017 21:37    ROS Blood pressure 117/79, pulse 73, temperature 97.8 F (36.6 C), temperature source Oral, resp. rate (!) 28, height '6\' 2"'$  (1.88 m), weight 181 lb 10.5 oz (82.4 kg), SpO2 97 %. Physical Exam  Assessment/Plan: 57 yo nonsmoker who presents with cough, shortness of breath and pleuritic CP. He has a obstruction of his left main stem bronchus and a loculated left pleural effusion. He is currently being treated for pneumonia with IV vancomycin and Zosyn.  He needs bronchoscopy- flexible and possibly rigid and possible left VATS for drainage of pleural effusion. It is unclear if the bronchial obstruction is debris/ mucous or if there may be an underlying mass. The airway needs to be reestablished first. I discussed the indications, risks, benefits and alternatives with Dan Salazar and with his POA Claudie Fisherman. They understand the general nature of the procedure, the need for general anesthesia, the incisions to be used and the use of drainage tubes postoperatively. They understand the risks include but are not limited to death, MI, DVT, PE, bleeding, possible need for transfusion, infection, air leak, inability to clear airway as well as the possibility of other unforeseeable complications.  They both are in agreement to proceed  Plan OR tomorrow morning  Melrose Nakayama 11/02/2017, 1:04 PM

## 2017-11-02 NOTE — Progress Notes (Signed)
Pharmacy Antibiotic Note  Dan Salazar is a 57 y.o. male admitted on 10/31/2017 with pneumonia.  Pharmacy has been consulted for vanc/cefepime dosing.  Plan: 1. Adjust Vancomycin to 1g IV every 12 hours 2. Continue Zosyn 3.375g IV every 8 hours (infused over 4 hours) 3. Will continue to follow renal function, culture results, LOT, and antibiotic de-escalation plans   Height: 6\' 2"  (188 cm) Weight: 181 lb 10.5 oz (82.4 kg) IBW/kg (Calculated) : 82.2  Temp (24hrs), Avg:98.5 F (36.9 C), Min:97.7 F (36.5 C), Max:99.4 F (37.4 C)  Recent Labs  Lab 10/31/17 1714 11/01/17 0100 11/01/17 0129 11/01/17 0402 11/01/17 0646 11/02/17 0323  WBC 23.9*  --  20.3*  --   --  18.5*  CREATININE 1.21  --  1.33*  --   --  1.08  LATICACIDVEN  --  2.3*  --  1.5 1.6  --     Estimated Creatinine Clearance: 85.6 mL/min (by C-G formula based on SCr of 1.08 mg/dL).    Allergies  Allergen Reactions  . Hctz [Hydrochlorothiazide] Other (See Comments)    Severe electrolyte abnormalities   Thank you for allowing pharmacy to be a part of this patient's care.  Alycia Rossetti, PharmD, BCPS Clinical Pharmacist Pager: [phone] Clinical phone for 11/02/2017 from 7a-3:30p: [phone] If after 3:30p, please call main pharmacy at: [phone] 11/02/2017 1:16 PM

## 2017-11-02 NOTE — Progress Notes (Signed)
PULMONARY / CRITICAL CARE MEDICINE   Name: Dan Salazar MRN: [id number] DOB: 10/11/1960    ADMISSION DATE:  10/31/2017 CONSULTATION DATE:  11/01/2017  REFERRING MD:  Dr. Maudie Mercury, [facility]  CHIEF COMPLAINT:  Short of breath  HISTORY OF PRESENT ILLNESS:   57 yo male dyspnea, hypoxia, flank pain, weight loss and cough.  Found to have aspiration pneumonia and Lt pleural effusion.  PMHx of HTN, CKD 1.  SUBJECTIVE:  Breathing better.  VITAL SIGNS: BP 123/82 (BP Location: Left Arm)   Pulse 75   Temp 98.3 F (36.8 C) (Oral)   Resp (!) 24   Ht 6\' 2"  (1.88 m)   Wt 181 lb 10.5 oz (82.4 kg)   SpO2 97%   BMI 23.32 kg/m   INTAKE / OUTPUT: I/O last 3 completed shifts: In: 2900.6 [I.V.:1550.6; IV Piggyback:1350] Out: 1226 [Urine:1225; Stool:1]  PHYSICAL EXAMINATION:  General - pleasant Eyes - pupils reactive ENT - poor dentition Cardiac - regular, no murmur Chest - decreased BS on Lt Abd - soft, non tender Ext - no edema Skin - no rashes Neuro - normal strength Psych - normal mood  LABS:  BMET Recent Labs  Lab 10/31/17 1714 11/01/17 0129 11/02/17 0323  NA 133* 133* 137  K 4.6 4.1 4.2  CL 95* 100* 104  CO2 27 24 25   BUN 18 23* 19  CREATININE 1.21 1.33* 1.08  GLUCOSE 126* 153* 113*    Electrolytes Recent Labs  Lab 10/31/17 1714 11/01/17 0129 11/02/17 0323  CALCIUM 8.8* 7.7* 8.3*  MG  --  1.8 2.0  PHOS  --  4.2  --     CBC Recent Labs  Lab 10/31/17 1714 11/01/17 0129 11/02/17 0323  WBC 23.9* 20.3* 18.5*  HGB 12.6* 10.1* 10.1*  HCT 37.5* 30.7* 31.1*  PLT 363 267 315    Coag's Recent Labs  Lab 11/01/17 0100  INR 1.28    Sepsis Markers Recent Labs  Lab 11/01/17 0100 11/01/17 0402 11/01/17 0646 11/02/17 0323  LATICACIDVEN 2.3* 1.5 1.6  --   PROCALCITON 0.93  --   --  0.66    ABG No results for input(s): PHART, PCO2ART, PO2ART in the last 168 hours.  Liver Enzymes Recent Labs  Lab 11/01/17 0129  AST 16  ALT 21  ALKPHOS 66  BILITOT 1.0   ALBUMIN 2.4*    Cardiac Enzymes Recent Labs  Lab 10/31/17 2335 11/01/17 0646  TROPONINI 0.18* <0.03    Glucose Recent Labs  Lab 11/01/17 0317 11/01/17 0754 11/01/17 1207 11/01/17 1611 11/01/17 1910 11/02/17 0021  GLUCAP 120* 126* 151* 104* 186* 101*    Imaging No results found.   STUDIES:  CT angio chest 4/04 >> debris in Lt main bronchus, consolidation LLL, loculated Lt pleural effusion CT head 4/04 >> atrophy, microvascular ischemic changes Echo 4/05 >> EF 65 to 70%, PAS 38 mmHg  CULTURES: Blood 4/04 >> Respiratory viral panel 4/05 >> negative HIV 4/05 >> negative Pneumococcal Ag 4/05 >> negative Legionella Ag 4/05 >>   ANTIBIOTICS: Vancomycin 4/04 >> Zosyn 4/04 >>   SIGNIFICANT EVENTS: 4/04 Admit 4/06 Off pressors, transferred to [facility]  ASSESSMENT / PLAN:  Aspiration pneumonia. Loculated Lt pleural effusion. - continue Abx - TCTS to assess - f/u CXR  Septic shock. - off pressors 4/06 - continue IV fluids  Relative adrenal insufficiency. - cortisol 13.5 from 4/05 - wean off solu cortef  Dysphagia. - seen by speech therapy 4/05 >> regular diet  CKD 2. - monitor renal  fx  Anemia of critical illness. - f/u CBC  DVT prophylaxis - SQ heparin SUP - Protonix Nutrition - clear liquids Goals of care - full code   Chesley Mires, MD [place] 11/02/2017, 9:07 AM

## 2017-11-03 ENCOUNTER — Encounter (HOSPITAL_COMMUNITY)
Admission: EM | Disposition: A | Discharge status: Home Health | Payer: Self-pay | Source: Home / Self Care | Attending: Pulmonary Disease

## 2017-11-03 ENCOUNTER — Inpatient Hospital Stay (HOSPITAL_COMMUNITY): Payer: Self-pay

## 2017-11-03 ENCOUNTER — Inpatient Hospital Stay (HOSPITAL_COMMUNITY): Payer: Self-pay | Admitting: Anesthesiology

## 2017-11-03 DIAGNOSIS — J869 Pyothorax without fistula: Secondary | ICD-10-CM

## 2017-11-03 DIAGNOSIS — J189 Pneumonia, unspecified organism: Secondary | ICD-10-CM

## 2017-11-03 HISTORY — PX: FLEXIBLE BRONCHOSCOPY: SHX5094

## 2017-11-03 HISTORY — DX: Pneumonia, unspecified organism: J18.9

## 2017-11-03 HISTORY — PX: VIDEO ASSISTED THORACOSCOPY (VATS)/EMPYEMA: SHX6172

## 2017-11-03 LAB — URINALYSIS, ROUTINE W REFLEX MICROSCOPIC
BILIRUBIN URINE: NEGATIVE
Glucose, UA: NEGATIVE mg/dL
HGB URINE DIPSTICK: NEGATIVE
Ketones, ur: NEGATIVE mg/dL
Leukocytes, UA: NEGATIVE
NITRITE: NEGATIVE
Protein, ur: NEGATIVE mg/dL
SPECIFIC GRAVITY, URINE: 1.011 (ref 1.005–1.030)
pH: 5 (ref 5.0–8.0)

## 2017-11-03 LAB — GRAM STAIN

## 2017-11-03 LAB — GLUCOSE, CAPILLARY
GLUCOSE-CAPILLARY: 108 mg/dL — AB (ref 65–99)
Glucose-Capillary: 107 mg/dL — ABNORMAL HIGH (ref 65–99)
Glucose-Capillary: 104 mg/dL — ABNORMAL HIGH (ref 65–99)
Glucose-Capillary: 127 mg/dL — ABNORMAL HIGH (ref 65–99)
Glucose-Capillary: 112 mg/dL — ABNORMAL HIGH (ref 65–99)

## 2017-11-03 LAB — COMPREHENSIVE METABOLIC PANEL
ALT: 47 U/L (ref 17–63)
ANION GAP: 10 (ref 5–15)
AST: 40 U/L (ref 15–41)
Albumin: 2.1 g/dL — ABNORMAL LOW (ref 3.5–5.0)
Alkaline Phosphatase: 134 U/L — ABNORMAL HIGH (ref 38–126)
BILIRUBIN TOTAL: 0.6 mg/dL (ref 0.3–1.2)
BUN: 19 mg/dL (ref 6–20)
CO2: 24 mmol/L (ref 22–32)
Calcium: 8.1 mg/dL — ABNORMAL LOW (ref 8.9–10.3)
Chloride: 101 mmol/L (ref 101–111)
Creatinine, Ser: 1.36 mg/dL — ABNORMAL HIGH (ref 0.61–1.24)
GFR calc Af Amer: >60 mL/min (ref >60)
GFR, EST NON AFRICAN AMERICAN: 55 mL/min — AB (ref >60)
Glucose, Bld: 115 mg/dL — ABNORMAL HIGH (ref 65–99)
POTASSIUM: 4 mmol/L (ref 3.5–5.1)
Sodium: 135 mmol/L (ref 135–145)
TOTAL PROTEIN: 5.5 g/dL — AB (ref 6.5–8.1)

## 2017-11-03 LAB — PROTIME-INR
INR: 1.15
PROTHROMBIN TIME: 14.6 s (ref 11.4–15.2)

## 2017-11-03 LAB — BLOOD GAS, ARTERIAL
Acid-Base Excess: 0.4 mmol/L (ref 0.0–2.0)
BICARBONATE: 24 mmol/L (ref 20.0–28.0)
DRAWN BY: 347621
FIO2: 0.28
O2 Content: 2 L/min
O2 SAT: 91 %
PO2 ART: 59.1 mmHg — AB (ref 83.0–108.0)
Patient temperature: 98
pCO2 arterial: 34.7 mmHg (ref 32.0–48.0)
pH, Arterial: 7.453 — ABNORMAL HIGH (ref 7.350–7.450)

## 2017-11-03 LAB — CBC
HCT: 30 % — ABNORMAL LOW (ref 39.0–52.0)
HEMOGLOBIN: 9.7 g/dL — AB (ref 13.0–17.0)
MCH: 30.1 pg (ref 26.0–34.0)
MCHC: 32.3 g/dL (ref 30.0–36.0)
MCV: 93.2 fL (ref 78.0–100.0)
Platelets: 314 10*3/uL (ref 150–400)
RBC: 3.22 MIL/uL — AB (ref 4.22–5.81)
RDW: 13.8 % (ref 11.5–15.5)
WBC: 16.3 10*3/uL — AB (ref 4.0–10.5)

## 2017-11-03 LAB — BPAM RBC
Blood Product Expiration Date: 201904202359
Blood Product Expiration Date: 201904202359
ISSUE DATE / TIME: 201904071041
ISSUE DATE / TIME: 201904071041
Unit Type and Rh: 9500
Unit Type and Rh: 9500

## 2017-11-03 LAB — TYPE AND SCREEN
ABO/RH(D): O NEG
Antibody Screen: NEGATIVE
Unit division: 0
Unit division: 0

## 2017-11-03 LAB — SURGICAL PCR SCREEN
MRSA, PCR: NEGATIVE
STAPHYLOCOCCUS AUREUS: NEGATIVE

## 2017-11-03 LAB — PREPARE RBC (CROSSMATCH)

## 2017-11-03 LAB — PROCALCITONIN: Procalcitonin: 0.39 ng/mL

## 2017-11-03 LAB — ABO/RH: ABO/RH(D): O NEG

## 2017-11-03 LAB — APTT: aPTT: 31 seconds (ref 24–36)

## 2017-11-03 IMAGING — DX DG CHEST 1V PORT
1 series · 1 of 1 positions shown · non-contrast
Comparison: [DATE] at [DATE] a.m.

CLINICAL DATA: Chest tube placement.  Pneumothorax.

EXAM:
PORTABLE CHEST 1 VIEW

[chest ap]
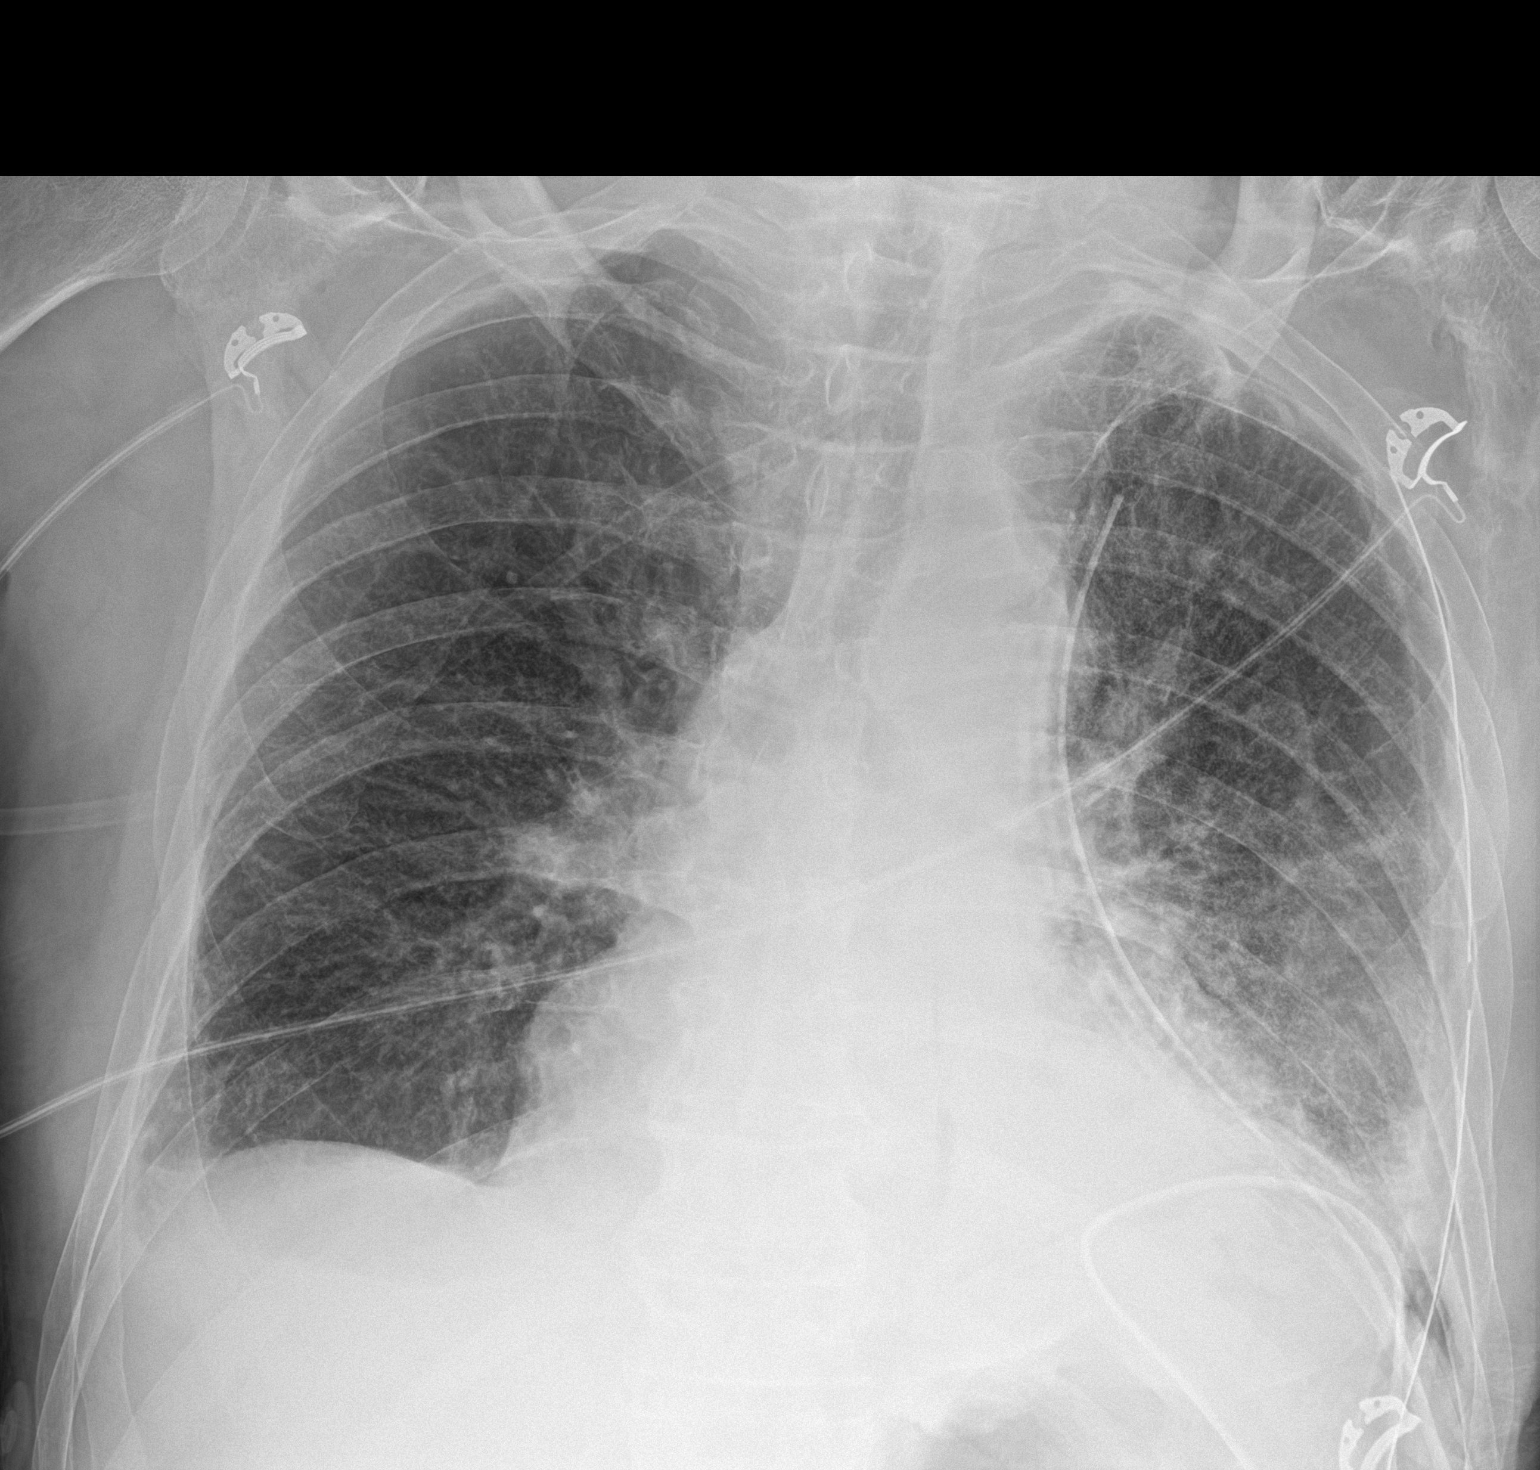

[1 of 1 positions shown; findings below may reference images not displayed]

FINDINGS: Since the earlier study, 2 left-sided chest tubes have been placed,
both with their tips near the left apex, 1 lateral and 1 medial.
There has been a significant reduction in the left hemithorax
opacification consistent with a vacuo a shin of pleural fluid. There
is persistent opacity at the left lung base consistent with
atelectasis or pneumonia.

Minimal pneumothorax suggests that the lung apex.

Mild reticular opacity at the lateral right lung base consistent
with atelectasis. Probable small right effusion. Remainder the right
lung is clear.
IMPRESSION: 1. Marked improvement in left lung aeration following placement 2
left-sided chest tubes.
2. Minimal left apical pneumothorax.

## 2017-11-03 IMAGING — DX DG CHEST 1V PORT
1 series · 1 of 1 positions shown · non-contrast
Comparison: CT chest [DATE]. Chest x-rays [DATE],
[DATE] and earlier.

CLINICAL DATA: Follow-up loculated LEFT pleural effusion.

EXAM:
PORTABLE CHEST 1 VIEW

[chest ap]
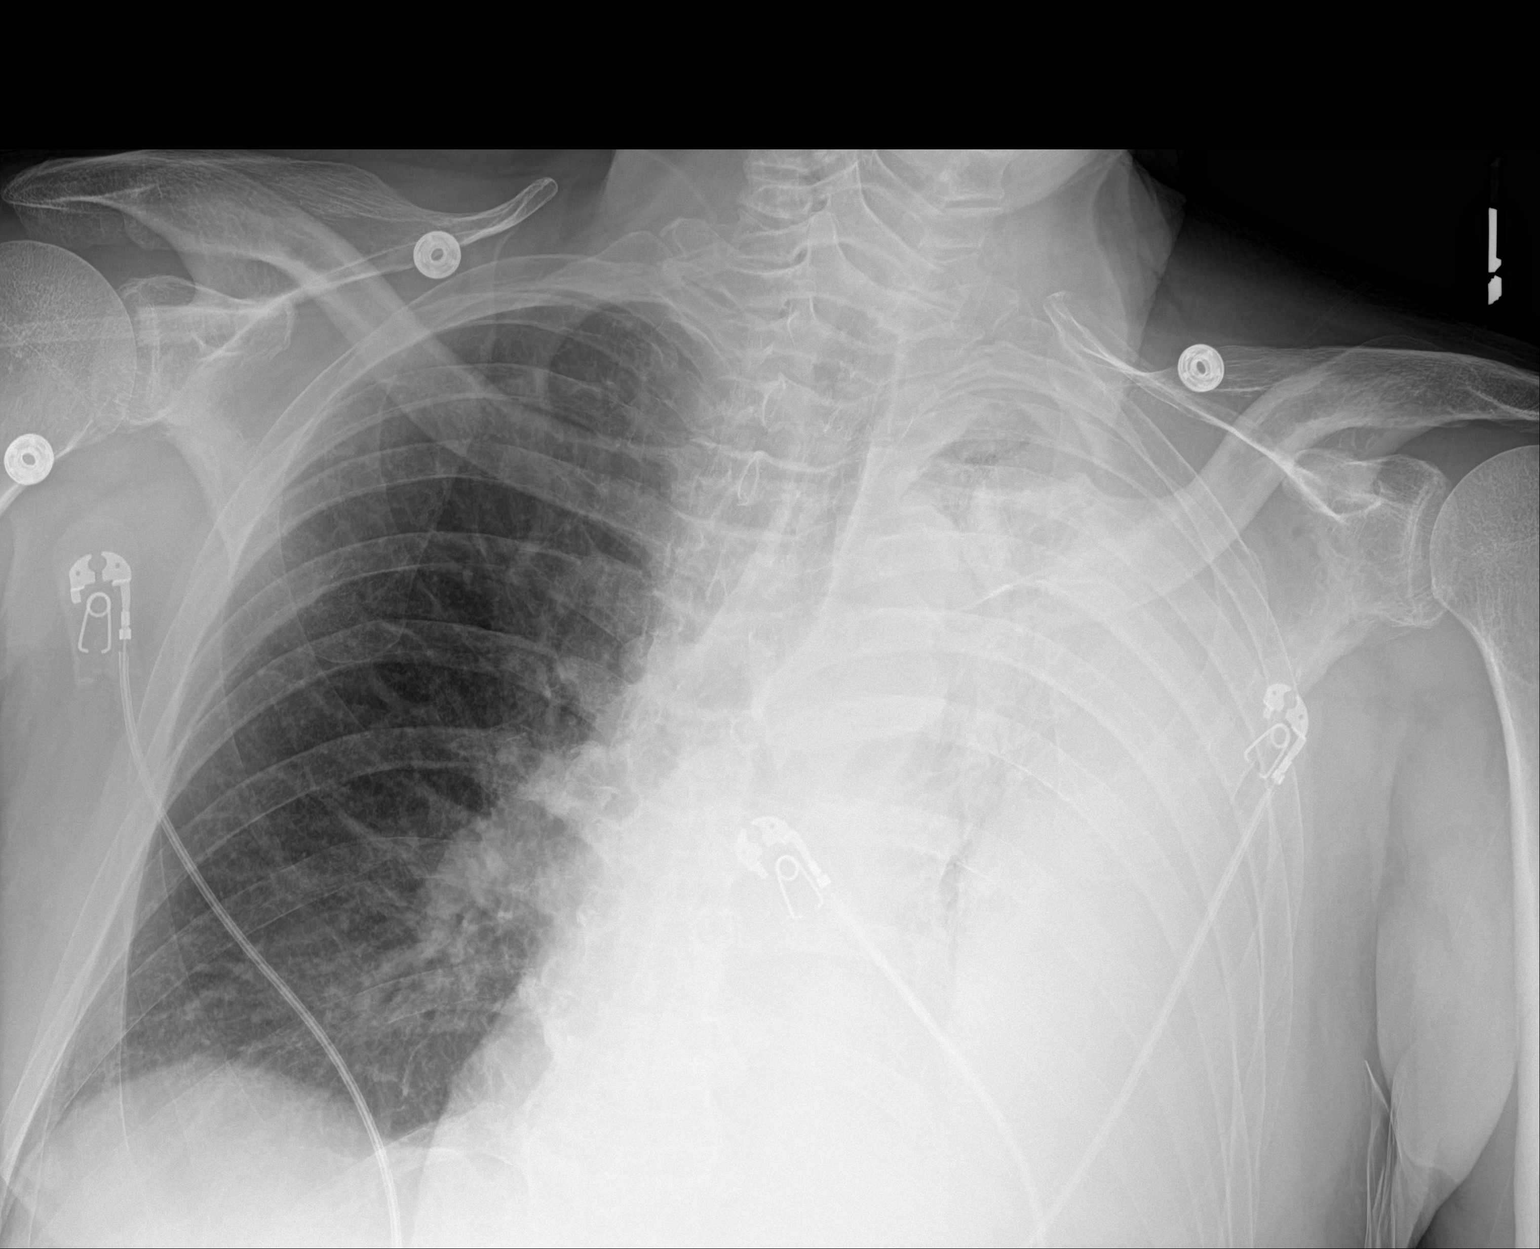

[1 of 1 positions shown; findings below may reference images not displayed]

FINDINGS: Since the CT 3 days ago, the loculated LEFT pleural effusion has
increased in size. The passive atelectasis and/or pneumonia
throughout the LEFT lung has worsened as a result. RIGHT lung
remains clear with evidence of emphysema. Heart size upper normal.
IMPRESSION: Enlarging loculated LEFT pleural effusion with worsening aeration in
the LEFT lung since the CT 3 days ago.

## 2017-11-03 SURGERY — BRONCHOSCOPY, FLEXIBLE
Anesthesia: General | Site: Chest | Laterality: Left

## 2017-11-03 MED ORDER — EPINEPHRINE PF 1 MG/ML IJ SOLN
INTRAMUSCULAR | Status: AC
  Filled 2017-11-03: qty 2

## 2017-11-03 MED ORDER — FENTANYL CITRATE (PF) 250 MCG/5ML IJ SOLN
INTRAMUSCULAR | Status: AC
  Filled 2017-11-03: qty 5

## 2017-11-03 MED ORDER — ACETAMINOPHEN 160 MG/5ML PO SOLN
1000.0000 mg | Freq: Four times a day (QID) | ORAL | Status: DC
  Administered 2017-11-03: 1000 mg via ORAL
  Filled 2017-11-03: qty 40.6

## 2017-11-03 MED ORDER — ONDANSETRON HCL 4 MG/2ML IJ SOLN
INTRAMUSCULAR | Status: AC
  Filled 2017-11-03: qty 2

## 2017-11-03 MED ORDER — SUCCINYLCHOLINE CHLORIDE 200 MG/10ML IV SOSY
PREFILLED_SYRINGE | INTRAVENOUS | Status: AC
  Filled 2017-11-03: qty 10

## 2017-11-03 MED ORDER — DEXAMETHASONE SODIUM PHOSPHATE 10 MG/ML IJ SOLN
INTRAMUSCULAR | Status: AC
  Filled 2017-11-03: qty 1

## 2017-11-03 MED ORDER — PROPOFOL 10 MG/ML IV BOLUS
INTRAVENOUS | Status: AC
  Filled 2017-11-03: qty 20

## 2017-11-03 MED ORDER — BUPIVACAINE LIPOSOME 1.3 % IJ SUSP
20.0000 mL | INTRAMUSCULAR | Status: AC
  Administered 2017-11-03: 20 mL
  Filled 2017-11-03: qty 20

## 2017-11-03 MED ORDER — TRAMADOL HCL 50 MG PO TABS
50.0000 mg | ORAL_TABLET | Freq: Four times a day (QID) | ORAL | Status: DC | PRN
  Administered 2017-11-11 – 2017-11-13 (×6): 50 mg via ORAL
  Filled 2017-11-03 (×7): qty 1

## 2017-11-03 MED ORDER — 0.9 % SODIUM CHLORIDE (POUR BTL) OPTIME
TOPICAL | Status: DC | PRN
  Administered 2017-11-03: 2000 mL

## 2017-11-03 MED ORDER — SODIUM CHLORIDE 0.9% FLUSH: 9.0000 mL | INTRAVENOUS | Status: DC | PRN

## 2017-11-03 MED ORDER — ONDANSETRON HCL 4 MG/2ML IJ SOLN
4.0000 mg | Freq: Four times a day (QID) | INTRAMUSCULAR | Status: DC | PRN
  Administered 2017-11-05 – 2017-11-09 (×2): 4 mg via INTRAVENOUS
  Filled 2017-11-03 (×2): qty 2

## 2017-11-03 MED ORDER — SODIUM CHLORIDE 0.9 % IJ SOLN
INTRAMUSCULAR | Status: DC | PRN
  Administered 2017-11-03: 50 mL via INTRAVENOUS

## 2017-11-03 MED ORDER — ACETAMINOPHEN 500 MG PO TABS
1000.0000 mg | ORAL_TABLET | Freq: Four times a day (QID) | ORAL | Status: DC
  Administered 2017-11-03 – 2017-11-05 (×6): 1000 mg via ORAL
  Filled 2017-11-03 (×6): qty 2

## 2017-11-03 MED ORDER — FENTANYL 40 MCG/ML IV SOLN
INTRAVENOUS | Status: DC
  Filled 2017-11-03 (×2): qty 25

## 2017-11-03 MED ORDER — MIDAZOLAM HCL 5 MG/5ML IJ SOLN
INTRAMUSCULAR | Status: DC | PRN
  Administered 2017-11-03: 2 mg via INTRAVENOUS

## 2017-11-03 MED ORDER — SUGAMMADEX SODIUM 200 MG/2ML IV SOLN
INTRAVENOUS | Status: DC | PRN
  Administered 2017-11-03: 200 mg via INTRAVENOUS

## 2017-11-03 MED ORDER — BUPIVACAINE HCL (PF) 0.5 % IJ SOLN: INTRAMUSCULAR | Status: DC | PRN

## 2017-11-03 MED ORDER — NALOXONE HCL 0.4 MG/ML IJ SOLN: 0.4000 mg | INTRAMUSCULAR | Status: DC | PRN

## 2017-11-03 MED ORDER — SENNOSIDES-DOCUSATE SODIUM 8.6-50 MG PO TABS
1.0000 | ORAL_TABLET | Freq: Every day | ORAL | Status: DC
  Administered 2017-11-03 – 2017-11-04 (×2): 1 via ORAL
  Filled 2017-11-03 (×2): qty 1

## 2017-11-03 MED ORDER — PROPOFOL 10 MG/ML IV BOLUS
INTRAVENOUS | Status: DC | PRN
  Administered 2017-11-03: 100 mg via INTRAVENOUS

## 2017-11-03 MED ORDER — PROMETHAZINE HCL 25 MG/ML IJ SOLN: 6.2500 mg | INTRAMUSCULAR | Status: DC | PRN

## 2017-11-03 MED ORDER — DIPHENHYDRAMINE HCL 50 MG/ML IJ SOLN: 12.5000 mg | Freq: Four times a day (QID) | INTRAMUSCULAR | Status: DC | PRN

## 2017-11-03 MED ORDER — OXYCODONE HCL 5 MG PO TABS
5.0000 mg | ORAL_TABLET | ORAL | Status: DC | PRN
  Administered 2017-11-03 (×2): 5 mg via ORAL
  Administered 2017-11-05: 10 mg via ORAL
  Filled 2017-11-03 (×2): qty 1
  Filled 2017-11-03: qty 2

## 2017-11-03 MED ORDER — DEXMEDETOMIDINE HCL 200 MCG/2ML IV SOLN
INTRAVENOUS | Status: DC | PRN
  Administered 2017-11-03 (×3): 4 ug via INTRAVENOUS

## 2017-11-03 MED ORDER — POTASSIUM CHLORIDE 10 MEQ/100ML IV SOLN: 10.0000 meq | Freq: Every day | INTRAVENOUS | Status: DC | PRN

## 2017-11-03 MED ORDER — PHENYLEPHRINE HCL 10 MG/ML IJ SOLN
INTRAVENOUS | Status: DC | PRN
  Administered 2017-11-03: 10 ug/min via INTRAVENOUS

## 2017-11-03 MED ORDER — DIPHENHYDRAMINE HCL 12.5 MG/5ML PO ELIX: 12.5000 mg | ORAL_SOLUTION | Freq: Four times a day (QID) | ORAL | Status: DC | PRN

## 2017-11-03 MED ORDER — MUPIROCIN 2 % EX OINT: 1.0000 "application " | TOPICAL_OINTMENT | Freq: Two times a day (BID) | CUTANEOUS | Status: DC

## 2017-11-03 MED ORDER — ROCURONIUM BROMIDE 100 MG/10ML IV SOLN
INTRAVENOUS | Status: DC | PRN
  Administered 2017-11-03: 70 mg via INTRAVENOUS
  Administered 2017-11-03: 20 mg via INTRAVENOUS

## 2017-11-03 MED ORDER — BISACODYL 5 MG PO TBEC
10.0000 mg | DELAYED_RELEASE_TABLET | Freq: Every day | ORAL | Status: DC
  Administered 2017-11-03 – 2017-11-04 (×2): 10 mg via ORAL
  Filled 2017-11-03 (×2): qty 2

## 2017-11-03 MED ORDER — ALBUMIN HUMAN 5 % IV SOLN
INTRAVENOUS | Status: DC | PRN
  Administered 2017-11-03 (×2): via INTRAVENOUS

## 2017-11-03 MED ORDER — FENTANYL CITRATE (PF) 100 MCG/2ML IJ SOLN
INTRAMUSCULAR | Status: DC | PRN
  Administered 2017-11-03 (×2): 50 ug via INTRAVENOUS
  Administered 2017-11-03 (×2): 100 ug via INTRAVENOUS
  Administered 2017-11-03 (×4): 50 ug via INTRAVENOUS

## 2017-11-03 MED ORDER — FENTANYL CITRATE (PF) 250 MCG/5ML IJ SOLN
INTRAMUSCULAR | Status: AC
Start: 2017-11-03 — End: 2017-11-03
  Filled 2017-11-03: qty 5

## 2017-11-03 MED ORDER — LACTATED RINGERS IV SOLN
INTRAVENOUS | Status: DC
  Administered 2017-11-03 (×3): via INTRAVENOUS

## 2017-11-03 MED ORDER — PHENYLEPHRINE 40 MCG/ML (10ML) SYRINGE FOR IV PUSH (FOR BLOOD PRESSURE SUPPORT)
PREFILLED_SYRINGE | INTRAVENOUS | Status: AC
  Filled 2017-11-03: qty 10

## 2017-11-03 MED ORDER — SODIUM CHLORIDE 0.9 % IV SOLN
INTRAVENOUS | Status: DC
  Administered 2017-11-03 – 2017-11-05 (×2): via INTRAVENOUS
  Administered 2017-11-06: 100 mL/h via INTRAVENOUS
  Administered 2017-11-06 (×2): via INTRAVENOUS
  Administered 2017-11-08: 100 mL/h via INTRAVENOUS

## 2017-11-03 MED ORDER — MIDAZOLAM HCL 2 MG/2ML IJ SOLN
INTRAMUSCULAR | Status: AC
  Filled 2017-11-03: qty 2

## 2017-11-03 MED ORDER — ROCURONIUM BROMIDE 10 MG/ML (PF) SYRINGE
PREFILLED_SYRINGE | INTRAVENOUS | Status: AC
  Filled 2017-11-03: qty 5

## 2017-11-03 MED ORDER — HYDROMORPHONE HCL 1 MG/ML IJ SOLN: 0.2500 mg | INTRAMUSCULAR | Status: DC | PRN

## 2017-11-03 MED ORDER — DEXAMETHASONE SODIUM PHOSPHATE 10 MG/ML IJ SOLN
INTRAMUSCULAR | Status: DC | PRN
  Administered 2017-11-03: 10 mg via INTRAVENOUS

## 2017-11-03 SURGICAL SUPPLY — 93 items
BNDG GAUZE ELAST 4 BULKY (GAUZE/BANDAGES/DRESSINGS) IMPLANT
CANISTER SUCT 3000ML PPV (MISCELLANEOUS) ×8 IMPLANT
CATH THORACIC 28FR (CATHETERS) ×4 IMPLANT
CATH THORACIC 36FR (CATHETERS) IMPLANT
CATH THORACIC 36FR RT ANG (CATHETERS) IMPLANT
CLEANER TIP ELECTROSURG 2X2 (MISCELLANEOUS) ×4 IMPLANT
CLIP VESOCCLUDE MED 6/CT (CLIP) ×4 IMPLANT
CONN ST 1/4X3/8  BEN (MISCELLANEOUS) ×2
CONN ST 1/4X3/8 BEN (MISCELLANEOUS) ×2 IMPLANT
CONN Y 3/8X3/8X3/8  BEN (MISCELLANEOUS) ×2
CONN Y 3/8X3/8X3/8 BEN (MISCELLANEOUS) ×2 IMPLANT
CONT SPEC 4OZ CLIKSEAL STRL BL (MISCELLANEOUS) ×20 IMPLANT
COVER BACK TABLE 60X90IN (DRAPES) ×4 IMPLANT
DERMABOND ADVANCED (GAUZE/BANDAGES/DRESSINGS) ×2
DERMABOND ADVANCED .7 DNX12 (GAUZE/BANDAGES/DRESSINGS) ×2 IMPLANT
DRAIN CHANNEL 28F RND 3/8 FF (WOUND CARE) ×4 IMPLANT
DRAIN CHANNEL 32F RND 10.7 FF (WOUND CARE) ×4 IMPLANT
DRAPE INCISE IOBAN 66X45 STRL (DRAPES) IMPLANT
DRAPE LAPAROSCOPIC ABDOMINAL (DRAPES) ×4 IMPLANT
DRAPE WARM FLUID 44X44 (DRAPE) IMPLANT
ELECT BLADE 6.5 EXT (BLADE) ×4 IMPLANT
ELECT REM PT RETURN 9FT ADLT (ELECTROSURGICAL) ×4
ELECTRODE REM PT RTRN 9FT ADLT (ELECTROSURGICAL) ×2 IMPLANT
FORCEPS BIOP RJ4 1.8 (CUTTING FORCEPS) IMPLANT
GAUZE SPONGE 4X4 12PLY STRL (GAUZE/BANDAGES/DRESSINGS) IMPLANT
GAUZE SPONGE 4X4 12PLY STRL LF (GAUZE/BANDAGES/DRESSINGS) ×4 IMPLANT
GAUZE VASELINE FOILPK 1/2 X 72 (GAUZE/BANDAGES/DRESSINGS) IMPLANT
GLOVE BIO SURGEON STRL SZ 6.5 (GLOVE) ×3 IMPLANT
GLOVE BIO SURGEONS STRL SZ 6.5 (GLOVE) ×1
GLOVE BIOGEL PI IND STRL 6 (GLOVE) ×8 IMPLANT
GLOVE BIOGEL PI IND STRL 6.5 (GLOVE) ×2 IMPLANT
GLOVE BIOGEL PI INDICATOR 6 (GLOVE) ×8
GLOVE BIOGEL PI INDICATOR 6.5 (GLOVE) ×2
GLOVE SURG SIGNA 7.5 PF LTX (GLOVE) ×8 IMPLANT
GOWN STRL REUS W/ TWL LRG LVL3 (GOWN DISPOSABLE) ×8 IMPLANT
GOWN STRL REUS W/ TWL XL LVL3 (GOWN DISPOSABLE) ×4 IMPLANT
GOWN STRL REUS W/TWL LRG LVL3 (GOWN DISPOSABLE) ×8
GOWN STRL REUS W/TWL XL LVL3 (GOWN DISPOSABLE) ×4
KIT BASIN OR (CUSTOM PROCEDURE TRAY) ×4 IMPLANT
KIT SUCTION CATH 14FR (SUCTIONS) ×4 IMPLANT
KIT TURNOVER KIT B (KITS) ×4 IMPLANT
NEEDLE HYPO 25GX1X1/2 BEV (NEEDLE) ×4 IMPLANT
NEEDLE SPNL 18GX3.5 QUINCKE PK (NEEDLE) IMPLANT
NEEDLE SPNL 22GX3.5 QUINCKE BK (NEEDLE) ×4 IMPLANT
NS IRRIG 1000ML POUR BTL (IV SOLUTION) ×16 IMPLANT
PACK CHEST (CUSTOM PROCEDURE TRAY) ×4 IMPLANT
PAD ARMBOARD 7.5X6 YLW CONV (MISCELLANEOUS) ×8 IMPLANT
PAD EYE OVAL STERILE LF (GAUZE/BANDAGES/DRESSINGS) IMPLANT
POUCH ENDO CATCH II 15MM (MISCELLANEOUS) IMPLANT
POUCH SPECIMEN RETRIEVAL 10MM (ENDOMECHANICALS) IMPLANT
SEALANT PROGEL (MISCELLANEOUS) IMPLANT
SEALANT SURG COSEAL 4ML (VASCULAR PRODUCTS) IMPLANT
SEALANT SURG COSEAL 8ML (VASCULAR PRODUCTS) IMPLANT
SOLUTION ANTI FOG 6CC (MISCELLANEOUS) ×8 IMPLANT
SPECIMEN JAR MEDIUM (MISCELLANEOUS) ×4 IMPLANT
SPONGE INTESTINAL PEANUT (DISPOSABLE) ×16 IMPLANT
SPONGE LAP 18X18 X RAY DECT (DISPOSABLE) ×4 IMPLANT
SPONGE TONSIL 1 RF SGL (DISPOSABLE) ×4 IMPLANT
SUT PROLENE 4 0 RB 1 (SUTURE)
SUT PROLENE 4-0 RB1 .5 CRCL 36 (SUTURE) IMPLANT
SUT SILK  1 MH (SUTURE) ×8
SUT SILK 1 MH (SUTURE) ×8 IMPLANT
SUT SILK 1 TIES 10X30 (SUTURE) IMPLANT
SUT SILK 2 0SH CR/8 30 (SUTURE) IMPLANT
SUT SILK 3 0SH CR/8 30 (SUTURE) IMPLANT
SUT VIC AB 0 CTX 27 (SUTURE) IMPLANT
SUT VIC AB 1 CTX 27 (SUTURE) ×4 IMPLANT
SUT VIC AB 2-0 CT1 27 (SUTURE)
SUT VIC AB 2-0 CT1 TAPERPNT 27 (SUTURE) IMPLANT
SUT VIC AB 2-0 CTX 36 (SUTURE) ×4 IMPLANT
SUT VIC AB 3-0 MH 27 (SUTURE) IMPLANT
SUT VIC AB 3-0 SH 27 (SUTURE)
SUT VIC AB 3-0 SH 27X BRD (SUTURE) IMPLANT
SUT VIC AB 3-0 X1 27 (SUTURE) ×4 IMPLANT
SUT VICRYL 0 UR6 27IN ABS (SUTURE) IMPLANT
SUT VICRYL 2 TP 1 (SUTURE) IMPLANT
SWAB COLLECTION DEVICE MRSA (MISCELLANEOUS) IMPLANT
SWAB CULTURE ESWAB REG 1ML (MISCELLANEOUS) IMPLANT
SYR 10ML LL (SYRINGE) IMPLANT
SYR 30ML LL (SYRINGE) ×4 IMPLANT
SYR TOOMEY 50ML (SYRINGE) IMPLANT
SYSTEM SAHARA CHEST DRAIN ATS (WOUND CARE) ×4 IMPLANT
TAPE CLOTH SURG 4X10 WHT LF (GAUZE/BANDAGES/DRESSINGS) ×4 IMPLANT
TIP APPLICATOR SPRAY EXTEND 16 (VASCULAR PRODUCTS) IMPLANT
TOWEL GREEN STERILE (TOWEL DISPOSABLE) ×4 IMPLANT
TOWEL GREEN STERILE FF (TOWEL DISPOSABLE) ×4 IMPLANT
TOWEL OR NON WOVEN STRL DISP B (DISPOSABLE) ×4 IMPLANT
TRAP SPECIMEN MUCOUS 40CC (MISCELLANEOUS) ×8 IMPLANT
TRAY FOLEY W/METER SILVER 16FR (SET/KITS/TRAYS/PACK) ×4 IMPLANT
TROCAR XCEL BLADELESS 5X75MML (TROCAR) ×4 IMPLANT
TUBE CONNECTING 12'X1/4 (SUCTIONS) ×2
TUBE CONNECTING 12X1/4 (SUCTIONS) ×6 IMPLANT
WATER STERILE IRR 1000ML POUR (IV SOLUTION) ×4 IMPLANT

## 2017-11-03 NOTE — Anesthesia Preprocedure Evaluation (Addendum)
Anesthesia Evaluation  Patient identified by MRN, date of birth, ID band Patient awake    Reviewed: Allergy & Precautions, NPO status , Patient's Chart, lab work & pertinent test results  History of Anesthesia Complications Negative for: history of anesthetic complications  Airway Mallampati: I  TM Distance: >3 FB Neck ROM: Full    Dental no notable dental hx. (+) Dental Advisory Given, Edentulous Upper, Partial Lower   Pulmonary shortness of breath,     + decreased breath sounds      Cardiovascular hypertension, Normal cardiovascular exam Rhythm:Regular Rate:Normal  Study Conclusions  - Left ventricle: The cavity size was normal. There was mild   concentric hypertrophy. Systolic function was vigorous. The   estimated ejection fraction was in the range of 65% to 70%. Wall   motion was normal; there were no regional wall motion   abnormalities. Left ventricular diastolic function parameters   were normal. - Aortic valve: Trileaflet; mildly thickened, mildly calcified   leaflets.   Neuro/Psych negative neurological ROS  negative psych ROS   GI/Hepatic negative GI ROS, Neg liver ROS,   Endo/Other  negative endocrine ROS  Renal/GU negative Renal ROS  negative genitourinary   Musculoskeletal negative musculoskeletal ROS (+)   Abdominal   Peds negative pediatric ROS (+)  Hematology negative hematology ROS (+)   Anesthesia Other Findings   Reproductive/Obstetrics negative OB ROS                            Anesthesia Physical  Anesthesia Plan  ASA: II  Anesthesia Plan: General   Post-op Pain Management:    Induction: Intravenous  PONV Risk Score and Plan: 2 and Ondansetron, Dexamethasone and Diphenhydramine  Airway Management Planned: Double Lumen EBT and Oral ETT  Additional Equipment: Arterial line  Intra-op Plan:   Post-operative Plan: Possible Post-op  intubation/ventilation  Informed Consent: I have reviewed the patients History and Physical, chart, labs and discussed the procedure including the risks, benefits and alternatives for the proposed anesthesia with the patient or authorized representative who has indicated his/her understanding and acceptance.   Dental advisory given  Plan Discussed with: CRNA, Anesthesiologist and Surgeon  Anesthesia Plan Comments:        Anesthesia Quick Evaluation

## 2017-11-03 NOTE — Anesthesia Procedure Notes (Signed)
Procedure Name: Intubation Date/Time: 11/03/2017 10:53 AM Performed by: Lavell Luster, CRNA Pre-anesthesia Checklist: Patient identified, Emergency Drugs available, Suction available, Patient being monitored and Timeout performed Patient Re-evaluated:Patient Re-evaluated prior to induction Oxygen Delivery Method: Circle system utilized Preoxygenation: Pre-oxygenation with 100% oxygen Induction Type: IV induction Ventilation: Mask ventilation without difficulty Laryngoscope Size: Mac and 4 Grade View: Grade I Tube type: Oral Tube size: 7.5 mm Number of attempts: 1 Airway Equipment and Method: Stylet Placement Confirmation: ETT inserted through vocal cords under direct vision,  positive ETCO2 and breath sounds checked- equal and bilateral Secured at: 22 cm Tube secured with: Tape Dental Injury: Teeth and Oropharynx as per pre-operative assessment

## 2017-11-03 NOTE — Anesthesia Postprocedure Evaluation (Signed)
Anesthesia Post Note  Patient: Dan Salazar  Procedure(s) Performed: FLEXIBLE BRONCHOSCOPY (Left ) VIDEO ASSISTED THORACOSCOPY (VATS) FOR DRAINAGE OF PLEURAL EFFUSION (Left Chest)     Patient location during evaluation: PACU Anesthesia Type: General Level of consciousness: sedated Pain management: pain level controlled Vital Signs Assessment: post-procedure vital signs reviewed and stable Respiratory status: spontaneous breathing and respiratory function stable Cardiovascular status: stable Postop Assessment: no apparent nausea or vomiting Anesthetic complications: no    Last Vitals:  Vitals:   11/03/17 1500 11/03/17 1510  BP: 113/78   Pulse: (!) 108 (!) 108  Resp: (!) 35 (!) 31  Temp:  (!) 36.3 C  SpO2: 100% 100%    Last Pain:  Vitals:   11/03/17 1500  TempSrc:   PainSc: 0-No pain                 Janira Mandell DANIEL

## 2017-11-03 NOTE — Anesthesia Procedure Notes (Signed)
Arterial Line Insertion Start/End4/01/2018 10:22 AM, 05/27/2018 10:32 AM Performed by: Duane Boston, MD, Sammie Bench, CRNA, CRNA  Patient location: Pre-op. Preanesthetic checklist: patient identified, IV checked, site marked, risks and benefits discussed, surgical consent, monitors and equipment checked, pre-op evaluation, timeout performed and anesthesia consent Lidocaine 1% used for infiltration radial was placed Catheter size: 20 G Hand hygiene performed  and maximum sterile barriers used  Allen's test indicative of satisfactory collateral circulation Attempts: 1 Procedure performed without using ultrasound guided technique.

## 2017-11-03 NOTE — Brief Op Note (Signed)
10/31/2017 - 11/03/2017  1:40 PM  PATIENT:  Dan Salazar  57 y.o. male  PRE-OPERATIVE DIAGNOSIS:  1. Left bronchial obstruction 2. Left pleural effusion and empyema  POST-OPERATIVE DIAGNOSIS:  1. Left bronchial obstruction 2. Left pleural effusion and empyema  PROCEDURE:  FLEXIBLE BRONCHOSCOPY, LEFT VIDEO ASSISTED THORACOSCOPY (VATS) FOR DRAINAGE OF PLEURAL EFFUSION and EMPYEMA, DECORTICATION  FINDINGS: Approximately 1200 cc of left pleural fluid removed  SURGEON:  Surgeon(s) and Role:    [place] Nakayama, MD - Primary  PHYSICIAN ASSISTANT: Lars Pinks PA-C  ANESTHESIA:   general  EBL:  200 mL   BLOOD ADMINISTERED:none  DRAINS: 28 French chest tube and a 28 and 32 Blake drains placed in the left pleural space   LOCAL MEDICATIONS USED:  Exparel  SPECIMEN:  Source of Specimen:  Left pleural fluid, visceral and parietal pleura  DISPOSITION OF SPECIMEN:  and culture  COUNTS CORRECT:  YES  DICTATION: .Dragon Dictation  PLAN OF CARE: Admit to inpatient   PATIENT DISPOSITION:  PACU - hemodynamically stable.   Delay start of Pharmacological VTE agent (>24hrs) due to surgical blood loss or risk of bleeding: yes

## 2017-11-03 NOTE — Progress Notes (Signed)
PULMONARY / CRITICAL CARE MEDICINE   Name: Misha Antonini MRN: [id number] DOB: [birth year]    ADMISSION DATE:  10/31/2017 CONSULTATION DATE:  11/01/2017  REFERRING MD:  Dr. Maudie Mercury, [facility]  CHIEF COMPLAINT:  Short of breath  HISTORY OF PRESENT ILLNESS:   57 yo male dyspnea, hypoxia, flank pain, weight loss and cough.  Found to have aspiration pneumonia and Lt pleural effusion.  PMHx of HTN, CKD 1.  SUBJECTIVE:  Anxious about having surgery.  VITAL SIGNS: BP 114/80 (BP Location: Left Arm)   Pulse 79   Temp 98.2 F (36.8 C) (Oral)   Resp 20   Ht 6\' 2"  (1.88 m)   Wt 184 lb 1.4 oz (83.5 kg)   SpO2 93%   BMI 23.64 kg/m   INTAKE / OUTPUT: I/O last 3 completed shifts: In: 4171.3 [P.O.:350; I.V.:2721.3; IV Piggyback:1100] Out: 976 [Urine:975; Stool:1]  PHYSICAL EXAMINATION:  General - pleasant Eyes - pupils reactive ENT - poor dentition Cardiac - regular, no murmur Chest - decreased BS on Lt Abd - soft, non tender Ext - no edema Skin - no rashes Neuro - normal strength Psych - normal mood  LABS:  BMET Recent Labs  Lab 11/01/17 0129 11/02/17 0323 11/03/17 0115  NA 133* 137 135  K 4.1 4.2 4.0  CL 100* 104 101  CO2 24 25 24   BUN 23* 19 19  CREATININE 1.33* 1.08 1.36*  GLUCOSE 153* 113* 115*    Electrolytes Recent Labs  Lab 11/01/17 0129 11/02/17 0323 11/03/17 0115  CALCIUM 7.7* 8.3* 8.1*  MG 1.8 2.0  --   PHOS 4.2  --   --     CBC Recent Labs  Lab 11/01/17 0129 11/02/17 0323 11/03/17 0115  WBC 20.3* 18.5* 16.3*  HGB 10.1* 10.1* 9.7*  HCT 30.7* 31.1* 30.0*  PLT 267 315 314    Coag's Recent Labs  Lab 11/01/17 0100 11/03/17 0115  APTT  --  31  INR 1.28 1.15    Sepsis Markers Recent Labs  Lab 11/01/17 0100 11/01/17 0402 11/01/17 0646 11/02/17 0323 11/03/17 0115  LATICACIDVEN 2.3* 1.5 1.6  --   --   PROCALCITON 0.93  --   --  0.66 0.39    ABG Recent Labs  Lab 11/03/17 0217  PHART 7.453*  PCO2ART 34.7  PO2ART 59.1*    Liver  Enzymes Recent Labs  Lab 11/01/17 0129 11/03/17 0115  AST 16 40  ALT 21 47  ALKPHOS 66 134*  BILITOT 1.0 0.6  ALBUMIN 2.4* 2.1*    Cardiac Enzymes Recent Labs  Lab 10/31/17 2335 11/01/17 0646  TROPONINI 0.18* <0.03    Glucose Recent Labs  Lab 11/02/17 1140 11/02/17 1545 11/02/17 2037 11/03/17 0015 11/03/17 0203 11/03/17 0750  GLUCAP 98 111* 152* 108* 107* 104*    Imaging No results found.   STUDIES:  CT angio chest 4/04 >> debris in Lt main bronchus, consolidation LLL, loculated Lt pleural effusion CT head 4/04 >> atrophy, microvascular ischemic changes Echo 4/05 >> EF 65 to 70%, PAS 38 mmHg  CULTURES: Blood 4/04 >> Respiratory viral panel 4/05 >> negative HIV 4/05 >> negative Pneumococcal Ag 4/05 >> negative Legionella Ag 4/05 >> negative  ANTIBIOTICS: Vancomycin 4/04 >> Zosyn 4/04 >>   SIGNIFICANT EVENTS: 4/04 Admit 4/06 Off pressors, transferred to [facility], [place] consulted  ASSESSMENT / PLAN:  Aspiration pneumonia. Loculated Lt pleural effusion. - continue ABx - to OR for bronch and VATS 4/07  Septic shock. - off pressors 4/06 - continue IV  fluids  Relative adrenal insufficiency. - cortisol 13.5 from 4/05 - d/c solu cortef 4/07  Dysphagia. - seen by speech therapy 4/05 >> regular diet  CKD 2. - monitor renal fx  Anemia of critical illness. - f/u CBC  DVT prophylaxis - SQ heparin SUP - Protonix Nutrition - NPO for surgery Goals of care - full code  Updated pt's family at bedside  Chesley Mires, MD [place] 11/03/2017, 8:34 AM

## 2017-11-03 NOTE — Progress Notes (Signed)
PCA orders discontinued.  PCA syringe walked down and returned to pharmacy window.

## 2017-11-03 NOTE — Progress Notes (Signed)
ABG drawn on 2 lpm .

## 2017-11-03 NOTE — Anesthesia Procedure Notes (Signed)
Procedure Name: Intubation Date/Time: 11/03/2017 11:35 AM Performed by: Lavell Luster, CRNA Pre-anesthesia Checklist: Patient identified, Emergency Drugs available, Suction available, Patient being monitored and Timeout performed Patient Re-evaluated:Patient Re-evaluated prior to induction Preoxygenation: Pre-oxygenation with 100% oxygen Induction Type: IV induction Ventilation: Mask ventilation without difficulty Laryngoscope Size: Mac and 4 Grade View: Grade I Tube type: Oral Endobronchial tube: Left, Double lumen EBT and EBT position confirmed by fiberoptic bronchoscope and 41 Fr Number of attempts: 1 Airway Equipment and Method: Stylet Placement Confirmation: ETT inserted through vocal cords under direct vision,  positive ETCO2 and breath sounds checked- equal and bilateral Tube secured with: Tape Dental Injury: Teeth and Oropharynx as per pre-operative assessment

## 2017-11-03 NOTE — Transfer of Care (Signed)
Immediate Anesthesia Transfer of Care Note  Patient: Dan Salazar  Procedure(s) Performed: FLEXIBLE BRONCHOSCOPY (Left ) VIDEO ASSISTED THORACOSCOPY (VATS) FOR DRAINAGE OF PLEURAL EFFUSION (Left Chest)  Patient Location: PACU  Anesthesia Type:General  Level of Consciousness: awake, alert  and oriented  Airway & Oxygen Therapy: Patient connected to face mask oxygen  Post-op Assessment: Post -op Vital signs reviewed and stable  Post vital signs: stable  Last Vitals:  Vitals Value Taken Time  BP 150/103 11/03/2017  2:04 PM  Temp    Pulse 123 11/03/2017  2:03 PM  Resp 36 11/03/2017  2:06 PM  SpO2 74 % 11/03/2017  2:03 PM  Vitals shown include unvalidated device data.  Last Pain:  Vitals:   11/03/17 0800  TempSrc:   PainSc: 0-No pain      Patients Stated Pain Goal: 2 (33/35/45 6256)  Complications: No apparent anesthesia complications

## 2017-11-04 ENCOUNTER — Inpatient Hospital Stay (HOSPITAL_COMMUNITY): Payer: Self-pay

## 2017-11-04 ENCOUNTER — Encounter (HOSPITAL_COMMUNITY): Payer: Self-pay | Admitting: Thoracic Surgery (Cardiothoracic Vascular Surgery)

## 2017-11-04 DIAGNOSIS — J189 Pneumonia, unspecified organism: Secondary | ICD-10-CM

## 2017-11-04 DIAGNOSIS — J9811 Atelectasis: Secondary | ICD-10-CM

## 2017-11-04 LAB — BASIC METABOLIC PANEL
Anion gap: 9 (ref 5–15)
BUN: 16 mg/dL (ref 6–20)
CO2: 22 mmol/L (ref 22–32)
CREATININE: 1.06 mg/dL (ref 0.61–1.24)
Calcium: 7.8 mg/dL — ABNORMAL LOW (ref 8.9–10.3)
Chloride: 104 mmol/L (ref 101–111)
Glucose, Bld: 102 mg/dL — ABNORMAL HIGH (ref 65–99)
Potassium: 4.2 mmol/L (ref 3.5–5.1)
SODIUM: 135 mmol/L (ref 135–145)

## 2017-11-04 LAB — BLOOD GAS, ARTERIAL
ACID-BASE DEFICIT: 0.1 mmol/L (ref 0.0–2.0)
BICARBONATE: 24 mmol/L (ref 20.0–28.0)
DRAWN BY: 34762
O2 Content: 7 L/min
O2 Saturation: 97.9 %
PATIENT TEMPERATURE: 98.2
PH ART: 7.409 (ref 7.350–7.450)
pCO2 arterial: 38.6 mmHg (ref 32.0–48.0)
pO2, Arterial: 102 mmHg (ref 83.0–108.0)

## 2017-11-04 LAB — CBC
HCT: 29.7 % — ABNORMAL LOW (ref 39.0–52.0)
Hemoglobin: 9.4 g/dL — ABNORMAL LOW (ref 13.0–17.0)
MCH: 29.6 pg (ref 26.0–34.0)
MCHC: 31.6 g/dL (ref 30.0–36.0)
MCV: 93.4 fL (ref 78.0–100.0)
Platelets: 304 10*3/uL (ref 150–400)
RBC: 3.18 MIL/uL — AB (ref 4.22–5.81)
RDW: 14.1 % (ref 11.5–15.5)
WBC: 15.2 10*3/uL — AB (ref 4.0–10.5)

## 2017-11-04 LAB — CULTURE, RESPIRATORY W GRAM STAIN: Culture: NORMAL

## 2017-11-04 LAB — PHOSPHORUS: PHOSPHORUS: 4.6 mg/dL (ref 2.5–4.6)

## 2017-11-04 LAB — VITAMIN B12: VITAMIN B 12: 82 pg/mL — AB (ref 180–914)

## 2017-11-04 LAB — FOLATE: Folate: 6 ng/mL (ref >5.9)

## 2017-11-04 LAB — GLUCOSE, CAPILLARY
GLUCOSE-CAPILLARY: 145 mg/dL — AB (ref 65–99)
Glucose-Capillary: 104 mg/dL — ABNORMAL HIGH (ref 65–99)
Glucose-Capillary: 117 mg/dL — ABNORMAL HIGH (ref 65–99)

## 2017-11-04 LAB — CULTURE, RESPIRATORY

## 2017-11-04 LAB — TSH: TSH: 0.817 u[IU]/mL (ref 0.350–4.500)

## 2017-11-04 LAB — VANCOMYCIN, TROUGH: VANCOMYCIN TR: 16 ug/mL (ref 15–20)

## 2017-11-04 LAB — MAGNESIUM: Magnesium: 1.9 mg/dL (ref 1.7–2.4)

## 2017-11-04 IMAGING — DX DG CHEST 1V PORT
1 series · 1 of 1 positions shown · non-contrast
Comparison: Yesterday

CLINICAL DATA: Pleural effusion on the left

EXAM:
PORTABLE CHEST 1 VIEW

[chest ap]
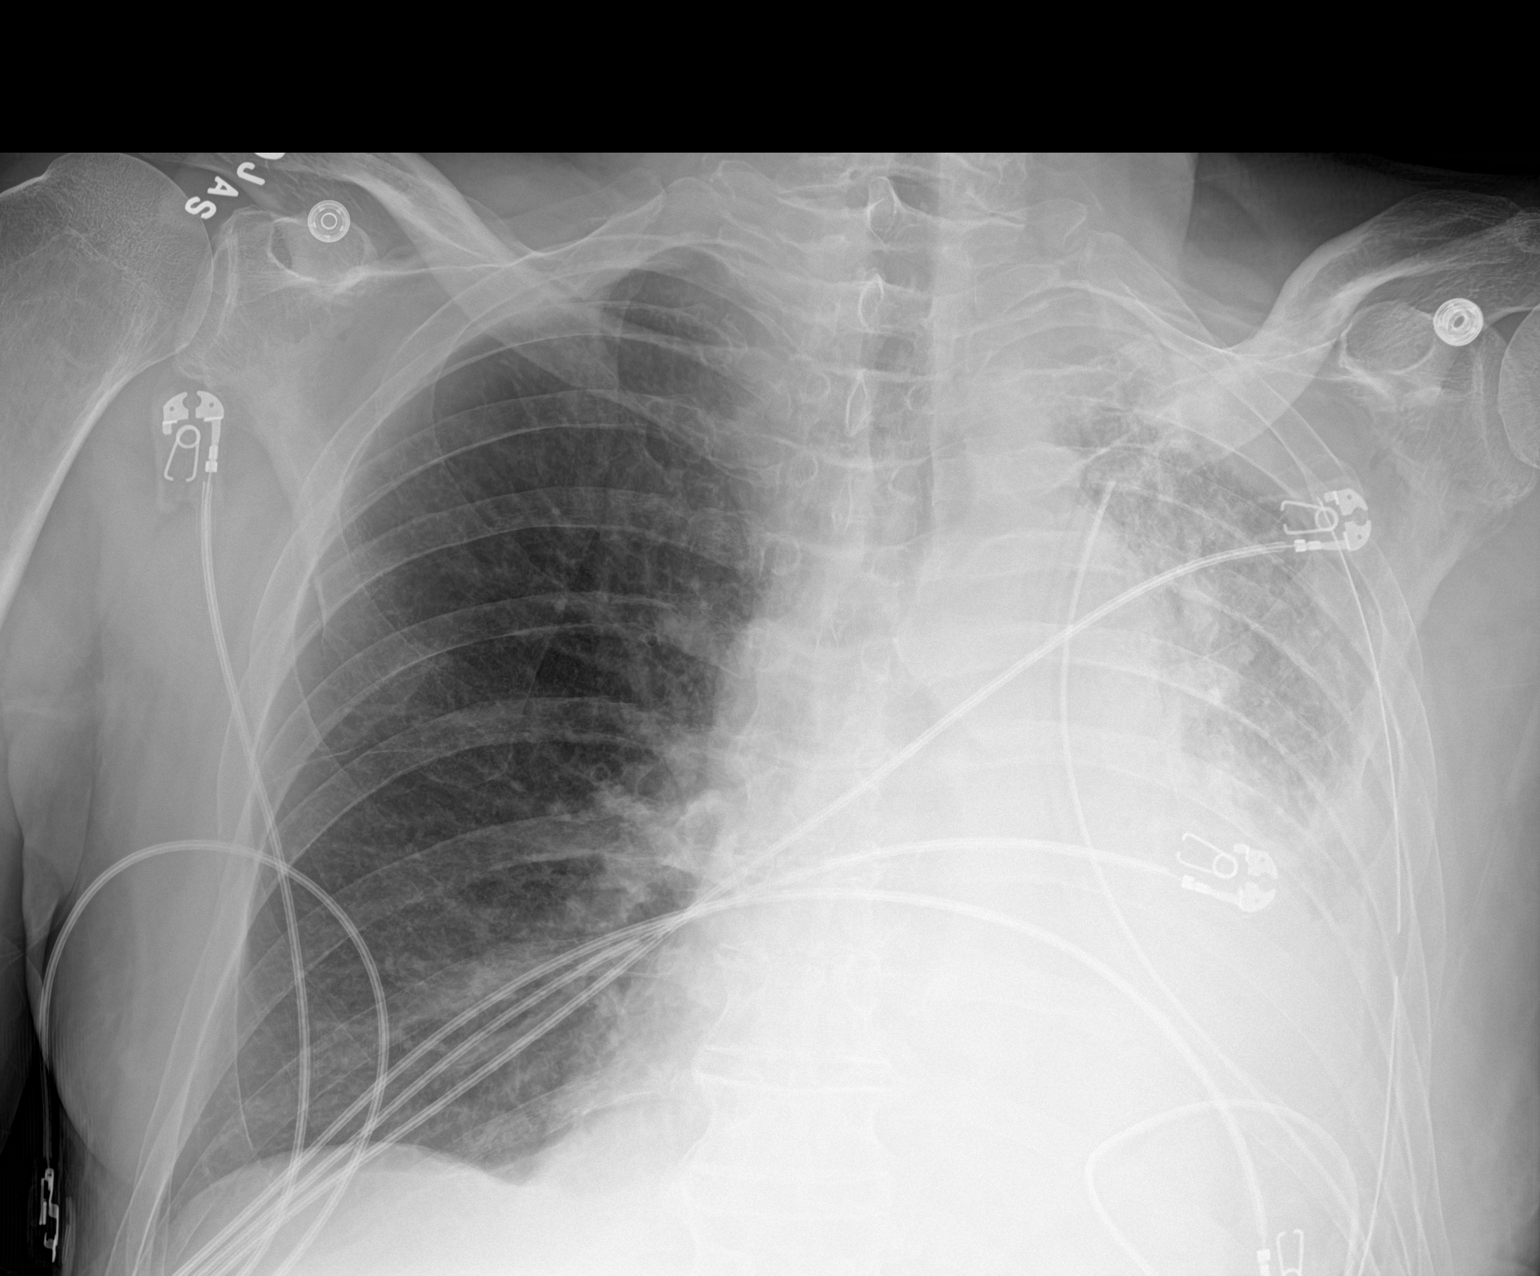

[1 of 1 positions shown; findings below may reference images not displayed]

FINDINGS: Empyema decortication yesterday. There is worsening volume and
increased opacity on the left. Stable chest tube positioning. No
visible pneumothorax. Right lung is clear. Stable apparent
cardiomegaly related to pleural disease by CT..
IMPRESSION: 1. Worsening left-sided aeration.
2. Possible increase in pleural fluid.

## 2017-11-04 IMAGING — DX DG CHEST 1V PORT
1 series · 1 of 1 positions shown · non-contrast
Comparison: [DATE] study obtained earlier in the day

CLINICAL DATA: Airspace consolidation

EXAM:
PORTABLE CHEST 1 VIEW

[chest ap]
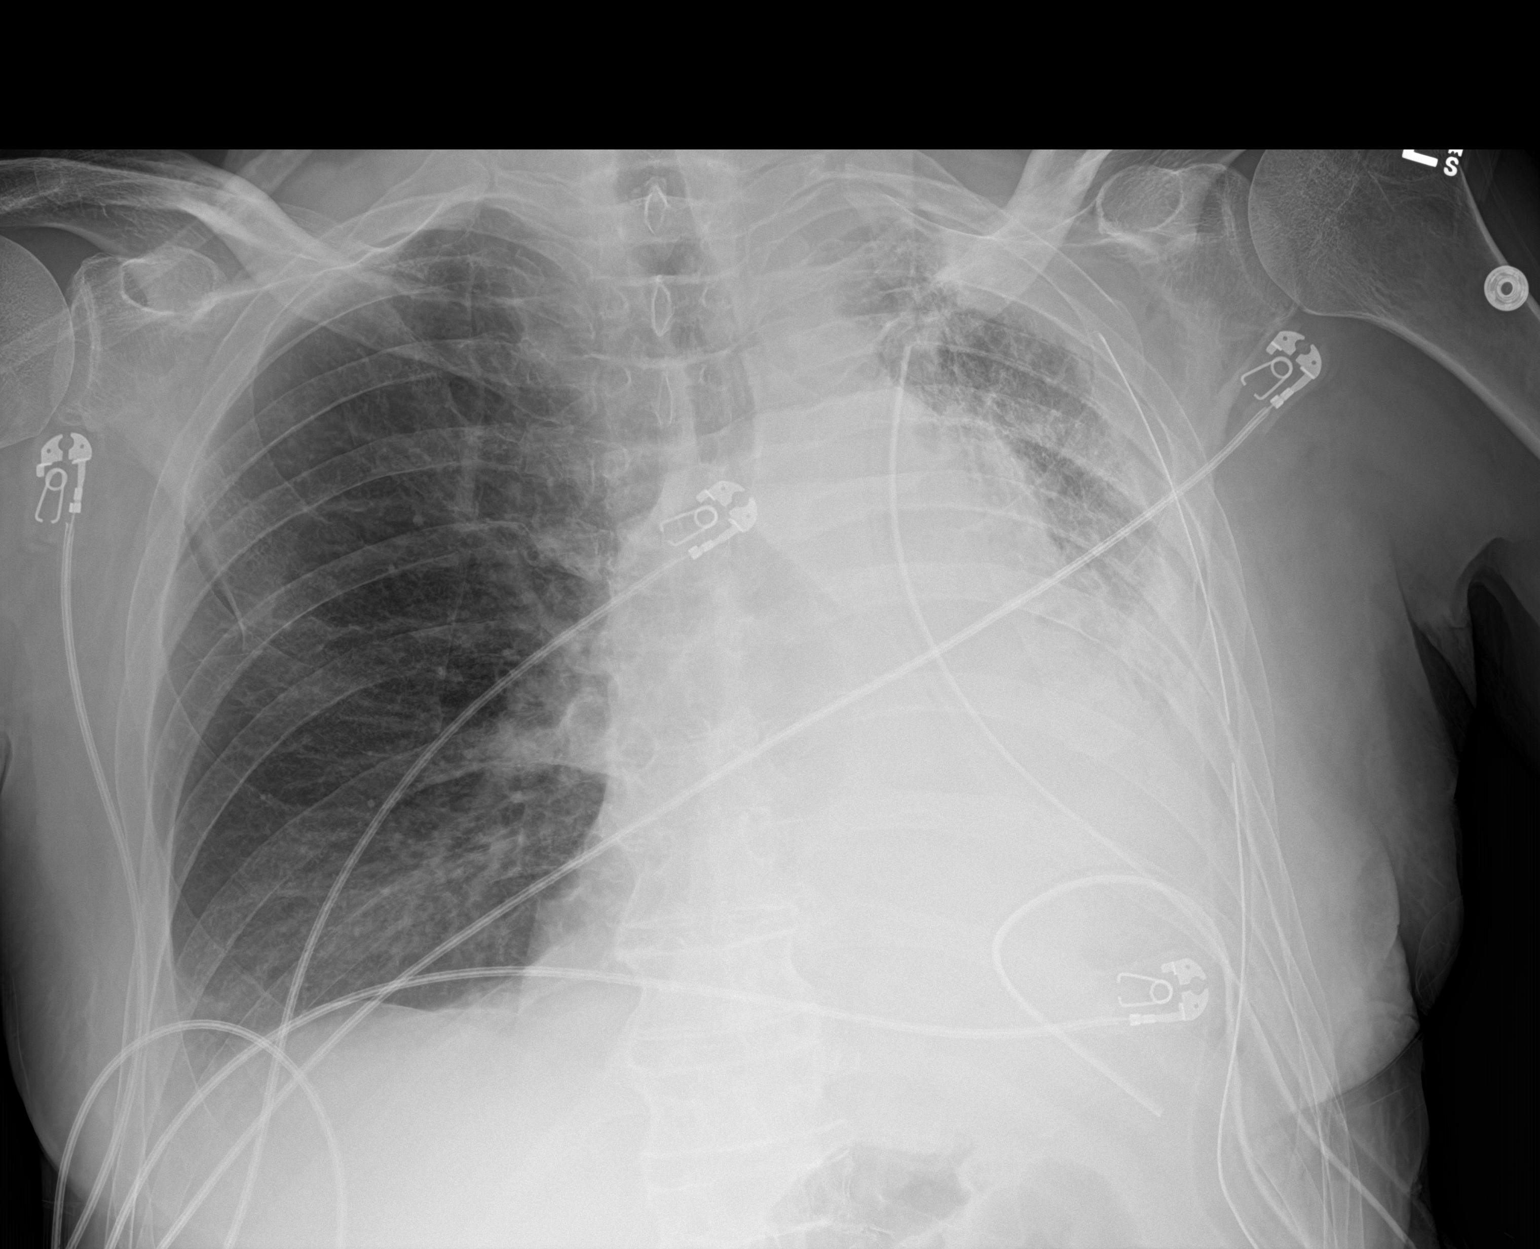

[1 of 1 positions shown; findings below may reference images not displayed]

FINDINGS: Chest tube positions on the left are stable. There is extensive
consolidation with volume loss on the left. There is also left
pleural effusion.

Right lung is hyperexpanded but clear. There is mild cardiomegaly.
Pulmonary vascularity on the right appears normal. There is
compression pulmonary vessels on the left. No adenopathy is evident.
No bone lesions.
IMPRESSION: Consolidation with volume loss and effusion on the left. Chest tubes
remain in place on the left without pneumothorax. Right lung
hyperexpanded but clear. Stable cardiac silhouette.

## 2017-11-04 MED ORDER — FENTANYL CITRATE (PF) 100 MCG/2ML IJ SOLN: 25.0000 ug | INTRAMUSCULAR | Status: DC | PRN

## 2017-11-04 MED ORDER — IPRATROPIUM-ALBUTEROL 0.5-2.5 (3) MG/3ML IN SOLN
3.0000 mL | Freq: Four times a day (QID) | RESPIRATORY_TRACT | Status: DC
  Administered 2017-11-04 – 2017-11-06 (×10): 3 mL via RESPIRATORY_TRACT
  Filled 2017-11-04 (×10): qty 3

## 2017-11-04 MED ORDER — LEVALBUTEROL HCL 0.63 MG/3ML IN NEBU: 0.6300 mg | INHALATION_SOLUTION | Freq: Three times a day (TID) | RESPIRATORY_TRACT | Status: DC

## 2017-11-04 MED ORDER — SODIUM CHLORIDE 3 % IN NEBU
4.0000 mL | INHALATION_SOLUTION | Freq: Every day | RESPIRATORY_TRACT | Status: DC
  Administered 2017-11-04: 4 mL via RESPIRATORY_TRACT
  Filled 2017-11-04: qty 4

## 2017-11-04 NOTE — Plan of Care (Addendum)
Continuing to improve. Art line removed this Am. Moderate output from chest tubes. 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 OOB this AM. Ambulated one lap around the unit with this RN assisting.

## 2017-11-04 NOTE — Progress Notes (Addendum)
[address]       ,[place] [address]             [phone]      1 Day Post-Op Procedure(s) (LRB): FLEXIBLE BRONCHOSCOPY (Left) VIDEO ASSISTED THORACOSCOPY (VATS) FOR DRAINAGE OF PLEURAL EFFUSION (Left) Subjective: Wants food. Upset because he has been stuck so many times for blood.   Objective: Vital signs in last 24 hours: Temp:  [97.3 F (36.3 C)-98.2 F (36.8 C)] 98.1 F (36.7 C) (04/08 0800) Pulse Rate:  [48-118] 58 (04/08 0800) Cardiac Rhythm: Normal sinus rhythm (04/07 2000) Resp:  [12-38] 15 (04/08 0800) BP: (105-151)/(70-104) 109/78 (04/08 0800) SpO2:  [86 %-100 %] 97 % (04/08 0800) Arterial Line BP: (77-165)/(61-76) 147/64 (04/08 0800) Weight:  [184 lb 1.4 oz (83.5 kg)] 184 lb 1.4 oz (83.5 kg) (04/08 0500)     Intake/Output from previous day: 04/07 0701 - 04/08 0700 In: 3083.3 [I.V.:2583.3; IV Piggyback:500] Out: 4401 [Urine:1820; Blood:200; Chest Tube:450] Intake/Output this shift: Total I/O In: 300 [I.V.:100; IV Piggyback:200] Out: 110 [Urine:60; Chest Tube:50]  General appearance: alert, cooperative and no distress Heart: regular rate and rhythm, S1, S2 normal, no murmur, click, rub or gallop Lungs: left sided crackles and diminished breath sounds Abdomen: soft, non-tender; bowel sounds normal; no masses,  no organomegaly Extremities: extremities normal, atraumatic, no cyanosis or edema Wound: clean and dry  Lab Results: Recent Labs    11/03/17 0115 11/04/17 0540  WBC 16.3* 15.2*  HGB 9.7* 9.4*  HCT 30.0* 29.7*  PLT 314 304   BMET:  Recent Labs    11/03/17 0115 11/04/17 0540  NA 135 135  K 4.0 4.2  CL 101 104  CO2 24 22  GLUCOSE 115* 102*  BUN 19 16  CREATININE 1.36* 1.06  CALCIUM 8.1* 7.8*    PT/INR:  Recent Labs    11/03/17 0115  LABPROT 14.6  INR 1.15   ABG    Component Value Date/Time   PHART 7.409 11/04/2017 0553   HCO3 24.0 11/04/2017 0553   TCO2 41 09/28/2016 0957   ACIDBASEDEF 0.1  11/04/2017 0553   O2SAT 97.9 11/04/2017 0553   CBG (last 3)  Recent Labs    11/04/17 0009 11/04/17 0354 11/04/17 0757  GLUCAP 145* 104* 117*   Results for orders placed or performed during the hospital encounter of 10/31/17  MRSA PCR Screening     Status: None   Collection Time: 10/31/17 11:03 PM  Result Value Ref Range Status   MRSA by PCR NEGATIVE NEGATIVE Final    Comment:        The GeneXpert MRSA Assay (FDA approved for NASAL specimens only), is one component of a comprehensive MRSA colonization surveillance program. It is not intended to diagnose MRSA infection nor to guide or monitor treatment for MRSA infections. Performed at [facility], [place] [address] [place], [place] [address]   Culture, blood (routine x 2) Call MD if unable to obtain prior to antibiotics being given     Status: None (Preliminary result)   Collection Time: 10/31/17 11:35 PM  Result Value Ref Range Status   Specimen Description   Final    BLOOD LEFT ANTECUBITAL Performed at [place] [address] [place], [place] [address]    Special Requests   Final    BOTTLES DRAWN AEROBIC AND ANAEROBIC Blood Culture adequate volume Performed at [place] [address], [place],  [address]  Culture   Final    NO GROWTH 2 DAYS Performed at [facility], [place] [address] [place], [place] [address]    Report Status PENDING  Incomplete  Culture, blood (routine x 2) Call MD if unable to obtain prior to antibiotics being given     Status: None (Preliminary result)   Collection Time: 10/31/17 11:35 PM  Result Value Ref Range Status   Specimen Description   Final    BLOOD LEFT HAND Performed at [place] [address] [place], [place] [address]    Special Requests   Final    BOTTLES DRAWN AEROBIC AND ANAEROBIC Blood Culture adequate volume Performed at [place] [address], [place], [place] [address]    Culture   Final    NO GROWTH 2 DAYS Performed at [place] [address], [place], [place] [address]    Report Status PENDING  Incomplete  Respiratory Panel by PCR     Status: None   Collection Time: 11/01/17  1:30 AM  Result Value Ref Range Status   Adenovirus NOT DETECTED NOT DETECTED Final   Coronavirus 229E NOT DETECTED NOT DETECTED Final   Coronavirus HKU1 NOT DETECTED NOT DETECTED Final   Coronavirus NL63 NOT DETECTED NOT DETECTED Final   Coronavirus OC43 NOT DETECTED NOT DETECTED Final   Metapneumovirus NOT DETECTED NOT DETECTED Final   Rhinovirus / Enterovirus NOT DETECTED NOT DETECTED Final   Influenza A NOT DETECTED NOT DETECTED Final   Influenza B NOT DETECTED NOT DETECTED Final   Parainfluenza Virus 1 NOT DETECTED NOT DETECTED Final   Parainfluenza Virus 2 NOT DETECTED NOT DETECTED Final   Parainfluenza Virus 3 NOT DETECTED NOT DETECTED Final   Parainfluenza Virus 4 NOT DETECTED NOT DETECTED Final   Respiratory Syncytial Virus NOT DETECTED NOT DETECTED Final   Bordetella pertussis NOT DETECTED NOT DETECTED Final   Chlamydophila pneumoniae NOT DETECTED NOT DETECTED Final   Mycoplasma pneumoniae NOT DETECTED NOT DETECTED Final    Comment: Performed at [facility], [place] [address], [place], [place] [address]  Culture, expectorated sputum-assessment     Status: None   Collection Time: 11/01/17 10:34 AM  Result Value Ref Range Status   Specimen Description SPUTUM  Final   Special Requests NONE  Final   Sputum evaluation   Final    THIS SPECIMEN IS ACCEPTABLE FOR SPUTUM CULTURE Performed at [facility], [place] [address] [place], [place] [address]    Report Status 11/01/2017 FINAL  Final  Culture, respiratory (NON-Expectorated)     Status: None (Preliminary result)   Collection Time: 11/01/17 10:34 AM  Result Value Ref Range Status   Specimen Description   Final    SPUTUM Performed at [place] [address], [place], [place] [address]    Special Requests   Final    NONE Reflexed from [id number] Performed at [facility], [place] [address] [place], [place] [address]    Gram Stain   Final    ABUNDANT WBC PRESENT, PREDOMINANTLY PMN NO ORGANISMS SEEN    Culture   Final    CULTURE REINCUBATED FOR BETTER GROWTH Performed at [facility], [place] [address], [place],  [address]    Report Status PENDING  Incomplete  Surgical PCR screen     Status: None   Collection Time: 11/03/17  9:13 AM  Result Value Ref Range Status   MRSA,  PCR NEGATIVE NEGATIVE Final   Staphylococcus aureus NEGATIVE NEGATIVE Final    Comment: (NOTE) The Xpert SA Assay (FDA approved for NASAL specimens in patients 71 years of age and older), is one component of a comprehensive surveillance program. It is not intended to diagnose infection nor to guide or monitor treatment. Performed at [facility], [place] [address], [place], [place] [address]   Culture, respiratory (NON-Expectorated)     Status: None (Preliminary result)   Collection Time: 11/03/17 11:16 AM  Result Value Ref Range Status   Specimen Description SPUTUM BRONCHIAL SPUTUM NOT WASHING  Final   Special Requests SPEC A  Final   Gram Stain   Final    ABUNDANT WBC PRESENT, PREDOMINANTLY PMN NO SQUAMOUS EPITHELIAL CELLS SEEN NO ORGANISMS SEEN Performed at [facility], [place] [address] [place], [place] [address]    Culture PENDING  Incomplete   Report Status PENDING  Incomplete  Gram stain     Status: None   Collection Time: 11/03/17 11:55 AM  Result Value Ref Range Status   Specimen Description PLEURAL LEFT  Final   Special Requests NONE  Final   Gram Stain   Final    FEW WBC PRESENT, PREDOMINANTLY PMN NO ORGANISMS SEEN Performed at [facility], [place] [address] [place], [place] [address]    Report Status 11/03/2017 FINAL  Final  Aerobic/Anaerobic Culture (surgical/deep wound)      Status: None (Preliminary result)   Collection Time: 11/03/17 12:07 PM  Result Value Ref Range Status   Specimen Description TISSUE LEFT PLEURAL PEEL  Final   Special Requests SPEC C  Final   Gram Stain   Final    MODERATE WBC PRESENT,BOTH PMN AND MONONUCLEAR NO ORGANISMS SEEN Performed at [facility], [place] [address], [place], [place] [address]    Culture PENDING  Incomplete   Report Status PENDING  Incomplete    Assessment/Plan: S/P Procedure(s) (LRB): FLEXIBLE BRONCHOSCOPY (Left) VIDEO ASSISTED THORACOSCOPY (VATS) FOR DRAINAGE OF PLEURAL EFFUSION (Left)  1. CV-NSR to SB. Rate 50-60bpm. BP well controlled.  2. Pulm-tolerating 6L [place] with good oxygen saturation. CXR shows worsening left-sided aeration and possible increase in pleural fluid. Chest tube remains in place. Chest tube put out 450cc/24 hours. Continue.  3. Renal-creatinine 1.06, electrolytes okay 4. H and H stable 5. Endo-blood glucose level well controlled 6. ID- continue Vanc and Zosyn   Plan: May need another bronchoscopy. Continue nebs and pulm toilet. Wean oxygen as tolerated. Keep chest tube. Cultures pending, continue broad spectrum antibiotics.    LOS: 4 days    Elgie Collard 11/04/2017 Patient seen and examined, agree with above CXR this AM shows increased left sided atelectasis. I think there is minimal if any effusion Ct draining serous fluid at this point Agree with aggressive pulmonary toilet as planned by Pulmonary but would have low threshold to re bronch if atelectasis worsens  Remo Lipps C. Roxan Hockey, MD [facility] [phone]

## 2017-11-04 NOTE — Care Management Note (Signed)
Case Management Note  Patient Details  Name: Dan Salazar MRN: [id number] Date of Birth: 1960-12-11  Subjective/Objective:     Admitted for Acute hypoxic respiratory failure in the setting of aspiration pneumonia Complicated by loculated Lt pleural effusion/Empyema s/p eft VATS on 4/7 now with postoperative atelectasis     Action/Plan: TCTS consulted.  Repeat Bronchoscopy pending. NCM will continue to follow for discharge needs.   Expected Discharge Date:  (unknown)               Expected Discharge Plan:   To Be Determined  In-House Referral:     Discharge planning Services  CM Consult  Post Acute Care Choice:    Choice offered to:     DME Arranged:    DME Agency:     HH Arranged:    HH Agency:     Status of Service:  In process, will continue to follow  If discussed at Long Length of Stay Meetings, dates discussed:    Additional Comments:  Kristen Cardinal, RN  Nurse case Manager [place] [phone] 11/04/2017, 11:15 AM

## 2017-11-04 NOTE — Progress Notes (Signed)
Pharmacy Antibiotic Note  Dan Salazar is a 57 y.o. male admitted on 10/31/2017 with pneumonia.  Pharmacy has been consulted for vancomycin and Zosyn dosing. WBC trending down. Afebrile. Thought to be aspiration related pneumonia. S/p VATs on 4/7 with postoperative atelectasis. Culture negative to date. SCr improving.   Vancomycin trough this AM =16 on 1g IV every 12 hours.   Plan: Continue Vancomycin to 1g IV every 12 hours Continue Zosyn 3.375g IV every 8 hours (infused over 4 hours) Will continue to follow renal function, culture results, LOT, and antibiotic de-escalation plans  *Consider if vancomycin could be discontinued   Height: 6\' 2"  (188 cm) Weight: 184 lb 1.4 oz (83.5 kg) IBW/kg (Calculated) : 82.2  Temp (24hrs), Avg:97.6 F (36.4 C), Min:97.3 F (36.3 C), Max:98.2 F (36.8 C)  Recent Labs  Lab 10/31/17 1714 11/01/17 0100 11/01/17 0129 11/01/17 0402 11/01/17 0646 11/02/17 0323 11/03/17 0115 11/04/17 0540  WBC 23.9*  --  20.3*  --   --  18.5* 16.3* 15.2*  CREATININE 1.21  --  1.33*  --   --  1.08 1.36* 1.06  LATICACIDVEN  --  2.3*  --  1.5 1.6  --   --   --   VANCOTROUGH  --   --   --   --   --   --   --  16    Estimated Creatinine Clearance: 87.2 mL/min (by C-G formula based on SCr of 1.06 mg/dL).    Allergies  Allergen Reactions  . Hctz [Hydrochlorothiazide] Other (See Comments)    Severe electrolyte abnormalities   Thank you for allowing pharmacy to be a part of this patient's care.  Sloan Leiter, PharmD, BCPS, BCCCP Clinical Pharmacist Clinical phone 11/04/2017 until 3:[phone] After hours, please call #[phone] 11/04/2017 10:02 AM

## 2017-11-04 NOTE — Addendum Note (Signed)
Addendum  created 11/04/17 0747 by Lavell Luster, CRNA   Charge Capture section accepted

## 2017-11-04 NOTE — Op Note (Signed)
NAMEFARREN, NELLES NO.:  [id number]  MEDICAL RECORD NO.:  [id number]  LOCATION:  [facility]                         FACILITY:  [facility]  PHYSICIAN:  Revonda Standard. Roxan Hockey, M.D.DATE OF BIRTH:  Nov 10, 1960  DATE OF PROCEDURE:  11/03/2017 DATE OF DISCHARGE:                              OPERATIVE REPORT   PREOPERATIVE DIAGNOSES:  Left bronchial obstruction, left pleural effusion, empyema.  POSTOPERATIVE DIAGNOSES:  Mucous plugging of left bronchus and left empyema.  PROCEDURE:   Flexible fiberoptic bronchoscopy.  Left video-assisted thoracoscopy for drainage of empyema and decortication.  SURGEON:  Revonda Standard. Roxan Hockey, M.D.  ASSISTANT:  Lars Pinks, PA.  ANESTHESIA:  General.  FINDINGS:  Left mainstem bronchus completely occluded with thick mucus plug.  No underlying endobronchial lesion.  Blunting of carina and extrinsic narrowing of the lower lobe bronchi.  Minimal purulent secretions on right.  1200 mL of murky fluid evacuated from multiple loculated fluid collections in left chest, dense visceral pleural peel, left lower lobe densely consolidated.  CLINICAL NOTE:  Mr. Kurtz is a 57 year old gentleman, who presented with a 64-monthhistory of progressive cough, shortness of breath, and left-sided pleuritic chest pain.  He had a chest x-ray, which showed a large pleural effusion.  CT of the chest showed a loculated left pleural effusion with consolidation and atelectasis of the left lung and obstruction of the left mainstem bronchus.  He was advised to undergo bronchoscopy and left VATS for drainage of a pleural effusion.  He understood the plan was to start with flexible bronchoscopy, but rigid bronchoscopy might be necessary.  The indications, risks, benefits, and alternatives were discussed in detail with the patient. Both he and his power of attorney understood the risks and agreed to proceed.  DESCRIPTION OF PROCEDURE:  Mr. SPuthoffwas brought  to the operating room on November 03, 2017.  He had induction of general anesthesia.  He was already receiving intravenous antibiotics and they were dosed according to schedule.  After performing a time-out, flexible fiberoptic bronchoscopy was performed via the endotracheal tube.  The trachea was clear.  There was thick yellow tinted mucus throughout the left mainstem bronchus.  Inspection of the right bronchial tree revealed no endobronchial lesions.  There was minimal purulent secretions from the right lower lobe.  These were cleared with saline.  The mucus plug then was evacuated from the left mainstem bronchus with suction.  This specimen was sent for cultures.  There was no underlying endobronchial lesion.  There was blunting of the carina and some extrinsic narrowing of the lower lobe bronchi.  Both the upper lobe and lower lobe were lavaged with saline until secretions cleared.  The bronchoscope was removed.  The patient was reintubated with a double-lumen endotracheal tube. Sequential compression devices were placed at the initiation of the procedure for DVT prophylaxis and a Foley catheter was in place. He was placed in a right lateral decubitus position and the left chest was prepped and draped in the usual sterile fashion.  Single lung ventilation of the right lung was initiated and was tolerated well throughout the procedure.  An incision was made in the seventh interspace in the midaxillary line. The incision  was carried down to the intercostal muscles with cautery. The chest was entered bluntly using a hemostat and a finger was inserted and probed around.  There was minimal fluid initially.  The scope was inserted and there was an obvious loculated fluid collection.  The scope was used to break up some of the loculations and murky yellow fluid was obtained.  This was sent for cultures.  A 5-cm working incision then was made in the fifth interspace anterolaterally.  No rib  spreading was performed during the procedure.  The remainder of the loculations then were broken up and the remainder of the fluid was evacuated, approximately 1.2 L of fluid in all.  There was no frankly purulent fluid.  There was an extensive peel on both the visceral and parietal pleura.  After the lung had been freed up circumferentially posteriorly, superiorly, and anteriorly, it was freed up off the diaphragmatic surface.  This was more difficult.  There was a dense inflammatory process in this area.  A plane was developed and ultimately the lung was freed up.  There was a dense fibrous peel around the diaphragmatic surface and inferior and posterior lateral surface of the left lower lobe was densely consolidated in that area.  There was peel extending over parts of the upper lobe and the remainder of the lower lobe, but it was more of a thin filmy peel in those areas that was easily removed.  The visceral pleurectomy then was performed on the lower lobe.  This was a long and tedious process. In some areas the peel was 7 mm thick and densely fibrous.  There were some parenchymal tears with removing this fibrous peel, but overall they were relatively superficial.  In some areas, the peel came off the lung completely and in other areas, there was a thin membrane.  A Vanderbilt clamp was used to develop a plane between the peel and the visceral pleura in these areas and that peel was carefully removed.  A test inflation of the lung showed good re-expansion with the exception of an area in the posterior lateral lower lobe.  An additional decortication was performed in that area.  There were some small areas of the lung denuded in that area in this process.  The chest was copiously irrigated with warm saline.  The parietal pleural peel then was removed.  Parts of the pleural peel were sent for culture as well as permanent pathology.  The chest was again copiously irrigated with warm  saline.  A 32-French Blake drain was placed through the original port incision and directed posteriorly.  Two additional port incisions were made and a 28-French chest tube was placed anteriorly and a 28-French Blake drain was placed along the diaphragm.  The lung was reinflated and there was good re-expansion of both the upper and lower lobes, nearly completely filling the chest. The incision then was closed in 3 layers.  The chest tubes were placed to suction.  The patient was placed back in a supine position.  He was extubated in the operating room and taken to [facility] in good condition.     Revonda Standard Roxan Hockey, M.D.     SCH/MEDQ  D:  11/03/2017  T:  11/04/2017  Job:  [id number]

## 2017-11-04 NOTE — Progress Notes (Addendum)
PULMONARY / CRITICAL CARE MEDICINE   Name: Dan Salazar MRN: [id number] DOB: 1961-03-17    ADMISSION DATE:  10/31/2017 CONSULTATION DATE:  11/01/2017  REFERRING MD:  Dr. Maudie Mercury, [facility]  CHIEF COMPLAINT:  Short of breath  HISTORY OF PRESENT ILLNESS:   57 yo male dyspnea, hypoxia, flank pain, weight loss and cough.  Found to have aspiration pneumonia and Lt pleural effusion.  PMHx of HTN, CKD 1.  SUBJECTIVE:  Comfortable, no acute distress, wants to eat.  VITAL SIGNS: Blood Pressure 120/76   Pulse (Abnormal) 53   Temperature 98.1 F (36.7 C)   Respiration (Abnormal) 21   Height 6\' 2"  (1.88 m)   Weight 184 lb 1.4 oz (83.5 kg)   Oxygen Saturation 100%   Body Mass Index 23.64 kg/m   Intake/Output Summary (Last 24 hours) at 11/04/2017 0937 Last data filed at 11/04/2017 0900 Gross per 24 hour  Intake 3408.33 ml  Output 3880 ml  Net -471.67 ml  Currently 6 L oxygen   INTAKE / OUTPUT: I/O last 3 completed shifts: In: 4233.3 [I.V.:3483.3; IV Piggyback:750] Out: 4070 [Urine:2120; Other:1300; Blood:200; Chest Tube:450]  PHYSICAL EXAMINATION:  General: This well-developed 57 year old male patient resting comfortably in bed he is in no acute distress HEENT: Normocephalic atraumatic poor dentition.  No jugular venous distention Pulmonary: Remarkably diminished on the left.  Chest tubes in satisfactory position.  Serous sanguinous drainage from chest tube no air leak Cardiac: Regular rate and rhythm without murmur rub or gallop Extremities/musculoskeletal: Warm, dry, brisk cap refill, equal strength.  He has dependent lower extremity edema Abdomen: Soft, nontender, positive bowel sounds.  No organomegaly. Neuro/psych: Awake alert oriented.  No focal deficits  LABS:  BMET Recent Labs  Lab 11/02/17 0323 11/03/17 0115 11/04/17 0540  NA 137 135 135  K 4.2 4.0 4.2  CL 104 101 104  CO2 25 24 22   BUN 19 19 16   CREATININE 1.08 1.36* 1.06  GLUCOSE 113* 115* 102*     Electrolytes Recent Labs  Lab 11/01/17 0129 11/02/17 0323 11/03/17 0115 11/04/17 0540  CALCIUM 7.7* 8.3* 8.1* 7.8*  MG 1.8 2.0  --  1.9  PHOS 4.2  --   --  4.6    CBC Recent Labs  Lab 11/02/17 0323 11/03/17 0115 11/04/17 0540  WBC 18.5* 16.3* 15.2*  HGB 10.1* 9.7* 9.4*  HCT 31.1* 30.0* 29.7*  PLT 315 314 304    Coag's Recent Labs  Lab 11/01/17 0100 11/03/17 0115  APTT  --  31  INR 1.28 1.15    Sepsis Markers Recent Labs  Lab 11/01/17 0100 11/01/17 0402 11/01/17 0646 11/02/17 0323 11/03/17 0115  LATICACIDVEN 2.3* 1.5 1.6  --   --   PROCALCITON 0.93  --   --  0.66 0.39    ABG Recent Labs  Lab 11/03/17 0217 11/04/17 0553  PHART 7.453* 7.409  PCO2ART 34.7 38.6  PO2ART 59.1* 102    Liver Enzymes Recent Labs  Lab 11/01/17 0129 11/03/17 0115  AST 16 40  ALT 21 47  ALKPHOS 66 134*  BILITOT 1.0 0.6  ALBUMIN 2.4* 2.1*    Cardiac Enzymes Recent Labs  Lab 10/31/17 2335 11/01/17 0646  TROPONINI 0.18* <0.03    Glucose Recent Labs  Lab 11/03/17 0750 11/03/17 1552 11/03/17 2003 11/04/17 0009 11/04/17 0354 11/04/17 0757  GLUCAP 104* 127* 112* 145* 104* 117*    Imaging Dg Chest Port 1 View  Result Date: 11/04/2017 CLINICAL DATA:  Pleural effusion on the left EXAM: PORTABLE CHEST  1 VIEW COMPARISON:  Yesterday FINDINGS: Empyema decortication yesterday. There is worsening volume and increased opacity on the left. Stable chest tube positioning. No visible pneumothorax. Right lung is clear. Stable apparent cardiomegaly related to pleural disease by CT. IMPRESSION: 1. Worsening left-sided aeration. 2. Possible increase in pleural fluid. Electronically Signed   By: Monte Fantasia M.D.   On: 11/04/2017 07:15   Dg Chest Port 1 View  Result Date: 11/03/2017 CLINICAL DATA:  Chest tube placement.  Pneumothorax. EXAM: PORTABLE CHEST 1 VIEW COMPARISON:  11/03/2017 at 5:49 a.m. FINDINGS: Since the earlier study, 2 left-sided chest tubes have been  placed, both with their tips near the left apex, 1 lateral and 1 medial. There has been a significant reduction in the left hemithorax opacification consistent with a vacuo a shin of pleural fluid. There is persistent opacity at the left lung base consistent with atelectasis or pneumonia. Minimal pneumothorax suggests that the lung apex. Mild reticular opacity at the lateral right lung base consistent with atelectasis. Probable small right effusion. Remainder the right lung is clear. IMPRESSION: 1. Marked improvement in left lung aeration following placement 2 left-sided chest tubes. 2. Minimal left apical pneumothorax. Electronically Signed   By: Lajean Manes M.D.   On: 11/03/2017 14:20     STUDIES:  CT angio chest 4/04 >> debris in Lt main bronchus, consolidation LLL, loculated Lt pleural effusion CT head 4/04 >> atrophy, microvascular ischemic changes Echo 4/05 >> EF 65 to 70%, PAS 38 mmHg  CULTURES: Blood 4/04 >> Respiratory viral panel 4/05 >> negative HIV 4/05 >> negative Pneumococcal Ag 4/05 >> negative Legionella Ag 4/05 >> negative  ANTIBIOTICS: Vancomycin 4/04 >> Zosyn 4/04 >>   SIGNIFICANT EVENTS: 4/04 Admit 4/06 Off pressors, transferred to [facility], [place] consulted  ASSESSMENT / PLAN:  Acute hypoxic respiratory failure in the setting of aspiration pneumonia Complicated by loculated Lt pleural effusion/Empyema status post left video-assisted thoracotomy on 4/7 now with postoperative atelectasis Portable chest x-ray review personally by myself.  This shows excellent chest tube positioning.  Right lung is clear.  Worsening left-sided aeration consistent with postoperative atelectasis Plan Mobilize Add Med-Neb for delivery of bronchodilators Add hypertonic saline Add flutter valve Wean oxygen Repeat chest x-ray later this a.m. thoracic surgery questioning bronchoscopy, we can try conservative measures listed above for now as his oxygen requirements are actually improving Keep  him n.p.o. for now Day #5 vancomycin and Zosyn; will plan on 10d course of abx. Probably dc vanc soon  Follow-up culture data   Septic shock. - off pressors 4/06 Plan Cont IVFs Monitor   Relative adrenal insufficiency. - cortisol 13.5 from 4/05 - d/c solu cortef 4/07 Plan Monitor   Dysphagia. - seen by speech therapy 4/05 >> regular diet Plan N.p.o. for now Should have formal swallow eval   CKD 2.  serum creatinine had increased, now improved.  Looks like baseline somewhere between 1.2 and 1.3 Plan Goal is to keep euvolemic today Renal dose medications Strict intake and output.  Anemia of critical illness. -Hemoglobin has remained stable. Plan Trend CBC Transfuse for hemoglobin less than 7 Subcu heparin to resume when okay with thoracic surgery  DVT prophylaxis -PAS hose SUP - Protonix Nutrition - NPO for surgery Goals of care - full code  40 minutes  Erick Colace ACNP-BC [place] Pager # [phone] OR # [phone] if no answer

## 2017-11-05 ENCOUNTER — Inpatient Hospital Stay (HOSPITAL_COMMUNITY): Payer: Self-pay

## 2017-11-05 DIAGNOSIS — Z789 Other specified health status: Secondary | ICD-10-CM

## 2017-11-05 DIAGNOSIS — Z01818 Encounter for other preprocedural examination: Secondary | ICD-10-CM

## 2017-11-05 DIAGNOSIS — J9601 Acute respiratory failure with hypoxia: Secondary | ICD-10-CM

## 2017-11-05 LAB — COMPREHENSIVE METABOLIC PANEL
ALT: 16 U/L — ABNORMAL LOW (ref 17–63)
AST: 11 U/L — AB (ref 15–41)
Albumin: 1.9 g/dL — ABNORMAL LOW (ref 3.5–5.0)
Alkaline Phosphatase: 85 U/L (ref 38–126)
Anion gap: 7 (ref 5–15)
BUN: 13 mg/dL (ref 6–20)
CHLORIDE: 107 mmol/L (ref 101–111)
CO2: 23 mmol/L (ref 22–32)
CREATININE: 1.08 mg/dL (ref 0.61–1.24)
Calcium: 7.7 mg/dL — ABNORMAL LOW (ref 8.9–10.3)
GFR calc Af Amer: >60 mL/min (ref >60)
GFR calc non Af Amer: >60 mL/min (ref >60)
GLUCOSE: 96 mg/dL (ref 65–99)
Potassium: 3.9 mmol/L (ref 3.5–5.1)
SODIUM: 137 mmol/L (ref 135–145)
Total Bilirubin: 0.8 mg/dL (ref 0.3–1.2)
Total Protein: 5 g/dL — ABNORMAL LOW (ref 6.5–8.1)

## 2017-11-05 LAB — CBC
HCT: 28.7 % — ABNORMAL LOW (ref 39.0–52.0)
Hemoglobin: 9.1 g/dL — ABNORMAL LOW (ref 13.0–17.0)
MCH: 29.9 pg (ref 26.0–34.0)
MCHC: 31.7 g/dL (ref 30.0–36.0)
MCV: 94.4 fL (ref 78.0–100.0)
PLATELETS: 292 10*3/uL (ref 150–400)
RBC: 3.04 MIL/uL — AB (ref 4.22–5.81)
RDW: 14.2 % (ref 11.5–15.5)
WBC: 11.1 10*3/uL — AB (ref 4.0–10.5)

## 2017-11-05 LAB — BLOOD GAS, ARTERIAL
Acid-Base Excess: 0.4 mmol/L (ref 0.0–2.0)
Bicarbonate: 24.1 mmol/L (ref 20.0–28.0)
DRAWN BY: 448981
FIO2: 40
O2 Saturation: 95.4 %
PEEP: 8 cmH2O
Patient temperature: 97.8
RATE: 18 resp/min
VT: 650 mL
pCO2 arterial: 35.1 mmHg (ref 32.0–48.0)
pH, Arterial: 7.448 (ref 7.350–7.450)
pO2, Arterial: 72.3 mmHg — ABNORMAL LOW (ref 83.0–108.0)

## 2017-11-05 LAB — GLUCOSE, CAPILLARY
GLUCOSE-CAPILLARY: 109 mg/dL — AB (ref 65–99)
GLUCOSE-CAPILLARY: 145 mg/dL — AB (ref 65–99)
Glucose-Capillary: 125 mg/dL — ABNORMAL HIGH (ref 65–99)
Glucose-Capillary: 99 mg/dL (ref 65–99)
Glucose-Capillary: 122 mg/dL — ABNORMAL HIGH (ref 65–99)

## 2017-11-05 LAB — CULTURE, RESPIRATORY: CULTURE: NO GROWTH

## 2017-11-05 LAB — MAGNESIUM
MAGNESIUM: 2 mg/dL (ref 1.7–2.4)
MAGNESIUM: 1.9 mg/dL (ref 1.7–2.4)

## 2017-11-05 LAB — CULTURE, RESPIRATORY W GRAM STAIN

## 2017-11-05 LAB — TRIGLYCERIDES: Triglycerides: 201 mg/dL — ABNORMAL HIGH (ref <150)

## 2017-11-05 LAB — PHOSPHORUS
PHOSPHORUS: 3.2 mg/dL (ref 2.5–4.6)
PHOSPHORUS: 3.1 mg/dL (ref 2.5–4.6)

## 2017-11-05 LAB — PROCALCITONIN: PROCALCITONIN: 0.1 ng/mL

## 2017-11-05 IMAGING — DX DG CHEST 1V PORT
1 series · 1 of 1 positions shown · non-contrast
Comparison: [DATE].

CLINICAL DATA: Pneumonia.  Shortness of breath.

EXAM:
PORTABLE CHEST 1 VIEW

[chest ap]
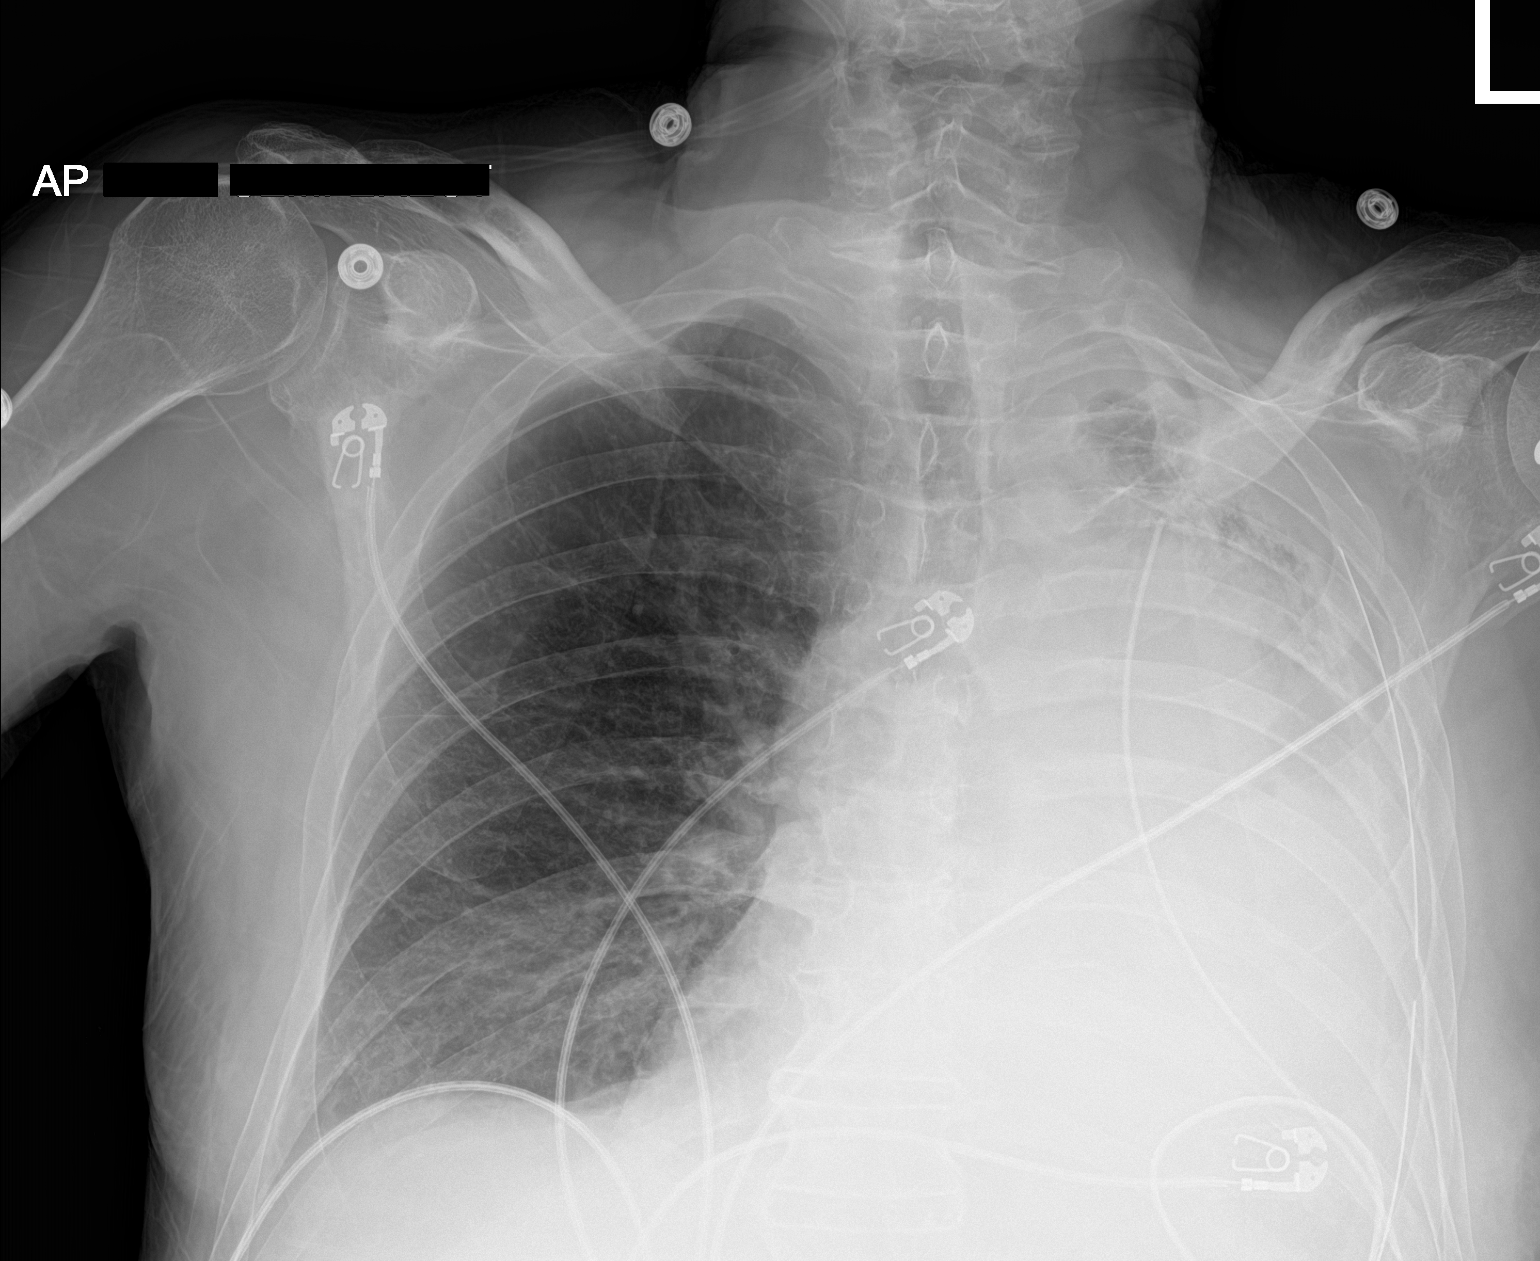

[1 of 1 positions shown; findings below may reference images not displayed]

FINDINGS: Two left chest tubes in stable position. No pneumothorax. Persistent
left lung consolidation and volume loss with left-sided pleural
effusion noted. No interim change. Probable cardiomegaly. Heart size
difficult to evaluate due to left lung consolidation. Degenerative
changes thoracic spine with thoracic spine scoliosis.
IMPRESSION: 1.  Two left chest tubes in stable position.  No pneumothorax.

2. Persistent left lung consolidation and volume loss with
left-sided pleural effusion. No interim change.

## 2017-11-05 IMAGING — DX DG CHEST 1V PORT
1 series · 1 of 1 positions shown · non-contrast
Comparison: Portable chest x-ray of today's date at [DATE] a.m..

CLINICAL DATA: Status post endotracheal and esophageal intubation.

EXAM:
PORTABLE CHEST 1 VIEW

[chest ap]
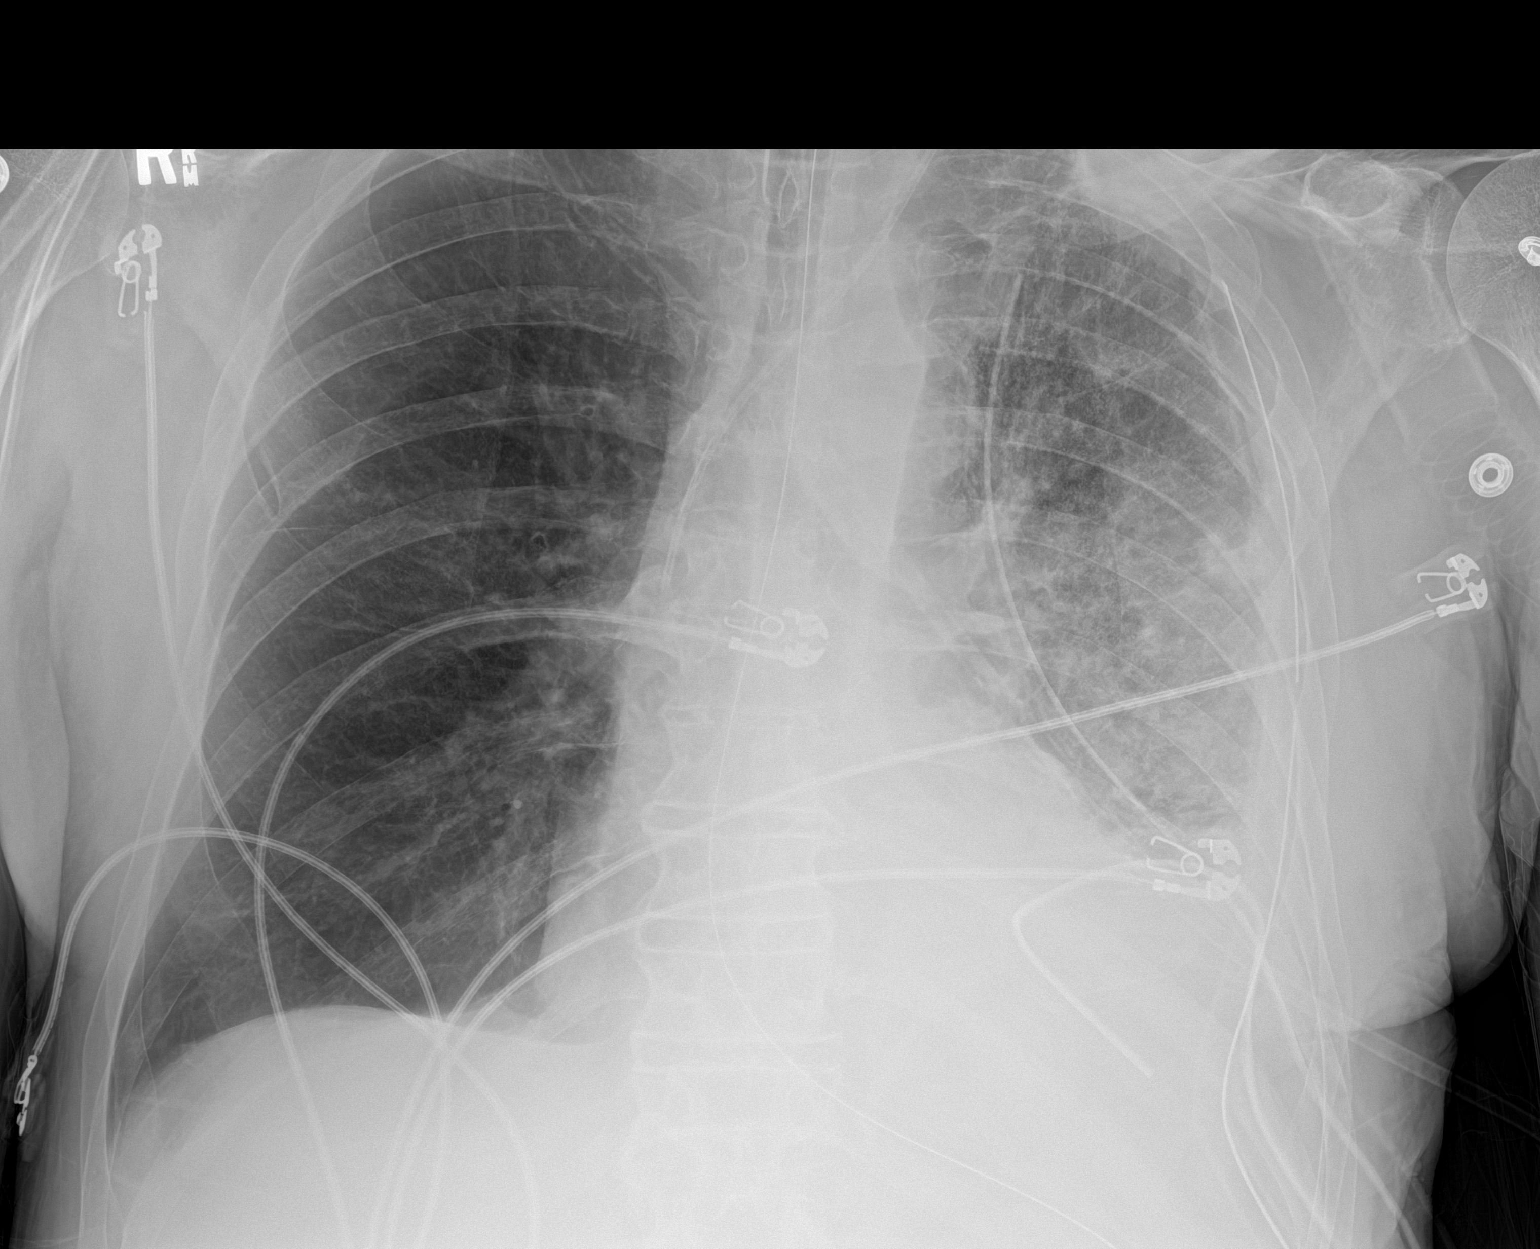

[1 of 1 positions shown; findings below may reference images not displayed]

FINDINGS: The endotracheal tube tip lies approximately 7.5 cm above the carina
and is at the level of the inferior margin of the clavicular heads.
The esophagogastric tubes proximal port and tip project below the GE
junction below the inferior margin of the image. The 3 left chest
tubes are in stable position. The interstitial markings of the left
lung are increased but the volume of pleural fluid has decreased.
The right lung is clear. The heart and pulmonary vascularity are
normal. The left internal jugular venous catheter tip projects over
the midportion of the SVC.
IMPRESSION: Intubation of the trachea and esophagus with reasonable positioning
of the tubes.

Decreased opacification of the left hemithorax with improved
aeration of the left lung. No pneumothorax.

The 3 left chest tubes are in stable position.

## 2017-11-05 MED ORDER — SODIUM CHLORIDE 0.9% FLUSH
10.0000 mL | INTRAVENOUS | Status: DC | PRN
  Administered 2017-11-05 – 2017-11-07 (×2): 10 mL
  Filled 2017-11-05 (×2): qty 40

## 2017-11-05 MED ORDER — ACETYLCYSTEINE 20 % IN SOLN
4.0000 mL | Freq: Three times a day (TID) | RESPIRATORY_TRACT | Status: AC
  Administered 2017-11-05 – 2017-11-08 (×9): 4 mL via RESPIRATORY_TRACT
  Filled 2017-11-05 (×9): qty 4

## 2017-11-05 MED ORDER — VITAL HIGH PROTEIN PO LIQD
1000.0000 mL | ORAL | Status: DC
  Administered 2017-11-05: 1000 mL
  Filled 2017-11-05 (×2): qty 1000

## 2017-11-05 MED ORDER — SODIUM CHLORIDE 0.9 % IV SOLN
0.0000 ug/min | INTRAVENOUS | Status: DC
  Filled 2017-11-05: qty 1

## 2017-11-05 MED ORDER — SODIUM CHLORIDE 0.9 % IV SOLN: 1.0000 mg | Freq: Once | INTRAVENOUS | Status: DC

## 2017-11-05 MED ORDER — DOCUSATE SODIUM 50 MG/5ML PO LIQD
100.0000 mg | Freq: Every day | ORAL | Status: DC
  Administered 2017-11-05 – 2017-11-09 (×3): 100 mg
  Filled 2017-11-05 (×3): qty 10

## 2017-11-05 MED ORDER — FOLIC ACID 5 MG/ML IJ SOLN
1.0000 mg | Freq: Once | INTRAMUSCULAR | Status: AC
  Administered 2017-11-05: 1 mg via INTRAVENOUS
  Filled 2017-11-05: qty 0.2

## 2017-11-05 MED ORDER — FENTANYL CITRATE (PF) 100 MCG/2ML IJ SOLN: 100.0000 ug | INTRAMUSCULAR | Status: DC | PRN

## 2017-11-05 MED ORDER — THIAMINE HCL 100 MG/ML IJ SOLN
100.0000 mg | Freq: Every day | INTRAMUSCULAR | Status: DC
  Administered 2017-11-05 – 2017-11-14 (×10): 100 mg via INTRAVENOUS
  Filled 2017-11-05 (×10): qty 2

## 2017-11-05 MED ORDER — ETOMIDATE 2 MG/ML IV SOLN
20.0000 mg | Freq: Once | INTRAVENOUS | Status: AC
  Administered 2017-11-05: 20 mg via INTRAVENOUS

## 2017-11-05 MED ORDER — ACETAMINOPHEN 160 MG/5ML PO SOLN
1000.0000 mg | Freq: Four times a day (QID) | ORAL | Status: DC
  Administered 2017-11-05 – 2017-11-06 (×4): 1000 mg
  Filled 2017-11-05 (×5): qty 40.6

## 2017-11-05 MED ORDER — CHLORHEXIDINE GLUCONATE 0.12% ORAL RINSE (MEDLINE KIT)
15.0000 mL | Freq: Two times a day (BID) | OROMUCOSAL | Status: DC
  Administered 2017-11-06: 15 mL via OROMUCOSAL

## 2017-11-05 MED ORDER — VITAL HIGH PROTEIN PO LIQD
1000.0000 mL | ORAL | Status: DC
  Administered 2017-11-06: 1000 mL
  Filled 2017-11-05 (×2): qty 1000

## 2017-11-05 MED ORDER — CHLORHEXIDINE GLUCONATE 0.12% ORAL RINSE (MEDLINE KIT)
15.0000 mL | Freq: Two times a day (BID) | OROMUCOSAL | Status: DC
  Administered 2017-11-05 (×2): 15 mL via OROMUCOSAL

## 2017-11-05 MED ORDER — PROPOFOL 1000 MG/100ML IV EMUL
0.0000 ug/kg/min | INTRAVENOUS | Status: DC
  Administered 2017-11-05: 35 ug/kg/min via INTRAVENOUS
  Administered 2017-11-05: 50 ug/kg/min via INTRAVENOUS
  Administered 2017-11-05: 40 ug/kg/min via INTRAVENOUS
  Administered 2017-11-05: 25 ug/kg/min via INTRAVENOUS
  Administered 2017-11-05 – 2017-11-06 (×3): 50 ug/kg/min via INTRAVENOUS
  Filled 2017-11-05 (×5): qty 100

## 2017-11-05 MED ORDER — PRO-STAT SUGAR FREE PO LIQD
30.0000 mL | Freq: Two times a day (BID) | ORAL | Status: DC
  Administered 2017-11-05: 30 mL
  Filled 2017-11-05: qty 30

## 2017-11-05 MED ORDER — BISACODYL 10 MG RE SUPP: 10.0000 mg | Freq: Every day | RECTAL | Status: DC | PRN

## 2017-11-05 MED ORDER — PROPOFOL 1000 MG/100ML IV EMUL
0.0000 ug/kg/min | INTRAVENOUS | Status: DC
  Filled 2017-11-05: qty 100

## 2017-11-05 MED ORDER — FENTANYL CITRATE (PF) 100 MCG/2ML IJ SOLN
100.0000 ug | INTRAMUSCULAR | Status: DC | PRN
  Administered 2017-11-07: 100 ug via INTRAVENOUS

## 2017-11-05 MED ORDER — SENNOSIDES 8.8 MG/5ML PO SYRP
5.0000 mL | ORAL_SOLUTION | Freq: Every day | ORAL | Status: DC
  Administered 2017-11-05: 5 mL
  Filled 2017-11-05: qty 5

## 2017-11-05 MED ORDER — ROCURONIUM BROMIDE 50 MG/5ML IV SOLN
60.0000 mg | Freq: Once | INTRAVENOUS | Status: AC
  Administered 2017-11-05: 60 mg via INTRAVENOUS

## 2017-11-05 MED ORDER — ORAL CARE MOUTH RINSE
15.0000 mL | OROMUCOSAL | Status: DC
  Administered 2017-11-05 – 2017-11-06 (×10): 15 mL via OROMUCOSAL

## 2017-11-05 MED ORDER — MIDAZOLAM HCL 2 MG/2ML IJ SOLN
INTRAMUSCULAR | Status: AC
  Administered 2017-11-05: 2 mg
  Filled 2017-11-05: qty 2

## 2017-11-05 MED ORDER — FENTANYL CITRATE (PF) 100 MCG/2ML IJ SOLN
100.0000 ug | INTRAMUSCULAR | Status: DC | PRN
  Administered 2017-11-05 (×2): 100 ug via INTRAVENOUS
  Filled 2017-11-05 (×2): qty 2

## 2017-11-05 MED ORDER — ORAL CARE MOUTH RINSE
15.0000 mL | Freq: Four times a day (QID) | OROMUCOSAL | Status: DC
  Administered 2017-11-05: 15 mL via OROMUCOSAL

## 2017-11-05 MED ORDER — FENTANYL CITRATE (PF) 100 MCG/2ML IJ SOLN
INTRAMUSCULAR | Status: AC
  Administered 2017-11-05: 100 ug
  Filled 2017-11-05: qty 2

## 2017-11-05 NOTE — Progress Notes (Signed)
PULMONARY / CRITICAL CARE MEDICINE   Name: Dan Salazar MRN: [id number] DOB: August 11, 1960    ADMISSION DATE:  10/31/2017 CONSULTATION DATE:  11/01/2017  REFERRING MD:  Dr. Maudie Mercury, [facility]  CHIEF COMPLAINT:  Short of breath  HISTORY OF PRESENT ILLNESS:   57 yo male dyspnea, hypoxia, flank pain, weight loss and cough.  Found to have aspiration pneumonia and Lt pleural effusion.  PMHx of HTN, CKD 1.  SUBJECTIVE:  Comfortable, no acute distress, wants to eat.  VITAL SIGNS: Blood Pressure 109/78   Pulse 62   Temperature 97.8 F (36.6 C) (Oral)   Respiration 15   Height 6\' 2"  (1.88 m)   Weight 192 lb 7.4 oz (87.3 kg)   Oxygen Saturation 99%   Body Mass Index 24.71 kg/m   Intake/Output Summary (Last 24 hours) at 11/05/2017 0826 Last data filed at 11/05/2017 0600 Gross per 24 hour  Intake 3030 ml  Output 1585 ml  Net 1445 ml  Currently 6 L oxygen   INTAKE / OUTPUT: I/O last 3 completed shifts: In: 4580 [P.O.:480; I.V.:3500; IV Piggyback:600] Out: 2490 [Urine:2205; Chest Tube:285]  PHYSICAL EXAMINATION:  General: 57 year old white male. Resting in bed. No distress but does have increased wob  HEENT: NCAT no JVD RRR Pulmonary: no air leak very diminished on the left some accessory use  Cardiac: RRR w/out MRG Extremities/musculoskeletal: W&D brisk CR  Abdomen:soft not tender + bowel sounds Neuro/psych: awake and alert no focal def  LABS:  BMET Recent Labs  Lab 11/03/17 0115 11/04/17 0540 11/05/17 0548  NA 135 135 137  K 4.0 4.2 3.9  CL 101 104 107  CO2 24 22 23   BUN 19 16 13   CREATININE 1.36* 1.06 1.08  GLUCOSE 115* 102* 96    Electrolytes Recent Labs  Lab 11/01/17 0129 11/02/17 0323 11/03/17 0115 11/04/17 0540 11/05/17 0548  CALCIUM 7.7* 8.3* 8.1* 7.8* 7.7*  MG 1.8 2.0  --  1.9  --   PHOS 4.2  --   --  4.6  --     CBC Recent Labs  Lab 11/03/17 0115 11/04/17 0540 11/05/17 0548  WBC 16.3* 15.2* 11.1*  HGB 9.7* 9.4* 9.1*  HCT 30.0* 29.7* 28.7*  PLT 314  304 292    Coag's Recent Labs  Lab 11/01/17 0100 11/03/17 0115  APTT  --  31  INR 1.28 1.15    Sepsis Markers Recent Labs  Lab 11/01/17 0100 11/01/17 0402 11/01/17 0646 11/02/17 0323 11/03/17 0115  LATICACIDVEN 2.3* 1.5 1.6  --   --   PROCALCITON 0.93  --   --  0.66 0.39    ABG Recent Labs  Lab 11/03/17 0217 11/04/17 0553  PHART 7.453* 7.409  PCO2ART 34.7 38.6  PO2ART 59.1* 102    Liver Enzymes Recent Labs  Lab 11/01/17 0129 11/03/17 0115 11/05/17 0548  AST 16 40 11*  ALT 21 47 16*  ALKPHOS 66 134* 85  BILITOT 1.0 0.6 0.8  ALBUMIN 2.4* 2.1* 1.9*    Cardiac Enzymes Recent Labs  Lab 10/31/17 2335 11/01/17 0646  TROPONINI 0.18* <0.03    Glucose Recent Labs  Lab 11/03/17 1552 11/03/17 2003 11/04/17 0009 11/04/17 0354 11/04/17 0757 11/05/17 0810  GLUCAP 127* 112* 145* 104* 117* 125*    Imaging Dg Chest Port 1 View  Result Date: 11/05/2017 CLINICAL DATA:  Pneumonia.  Shortness of breath. EXAM: PORTABLE CHEST 1 VIEW COMPARISON:  11/04/2017. FINDINGS: Two left chest tubes in stable position. No pneumothorax. Persistent left lung consolidation  and volume loss with left-sided pleural effusion noted. No interim change. Probable cardiomegaly. Heart size difficult to evaluate due to left lung consolidation. Degenerative changes thoracic spine with thoracic spine scoliosis. IMPRESSION: 1.  Two left chest tubes in stable position.  No pneumothorax. 2. Persistent left lung consolidation and volume loss with left-sided pleural effusion. No interim change. Electronically Signed   By: Marcello Moores  Register   On: 11/05/2017 06:57   Dg Chest Port 1 View  Result Date: 11/04/2017 CLINICAL DATA:  Airspace consolidation EXAM: PORTABLE CHEST 1 VIEW COMPARISON:  November 04, 2017 study obtained earlier in the day FINDINGS: Chest tube positions on the left are stable. There is extensive consolidation with volume loss on the left. There is also left pleural effusion. Right lung is  hyperexpanded but clear. There is mild cardiomegaly. Pulmonary vascularity on the right appears normal. There is compression pulmonary vessels on the left. No adenopathy is evident. No bone lesions. IMPRESSION: Consolidation with volume loss and effusion on the left. Chest tubes remain in place on the left without pneumothorax. Right lung hyperexpanded but clear. Stable cardiac silhouette. Electronically Signed   By: Lowella Grip III M.D.   On: 11/04/2017 14:49     STUDIES:  CT angio chest 4/04 >> debris in Lt main bronchus, consolidation LLL, loculated Lt pleural effusion CT head 4/04 >> atrophy, microvascular ischemic changes Echo 4/05 >> EF 65 to 70%, PAS 38 mmHg  CULTURES: Blood 4/04 >> Respiratory viral panel 4/05 >> negative HIV 4/05 >> negative Pneumococcal Ag 4/05 >> negative Legionella Ag 4/05 >> negative  ANTIBIOTICS: Vancomycin 4/04 >> Zosyn 4/04 >>   Procedures OETT 4/9>>> Bronch 4/9>>> Left [place] CVL 4/9>>>   SIGNIFICANT EVENTS: 4/04 Admit 4/06 Off pressors, transferred to [facility], [place] consulted  RESOLVED ISSUES RI Septic shock   ASSESSMENT / PLAN:  Acute hypoxic respiratory failure in the setting of aspiration pneumonia Complicated by loculated Lt pleural effusion/Empyema status post left video-assisted thoracotomy on 4/7 now with postoperative atelectasis Portable chest x-ray review personally by myself.  CTs remain in satisfactory position. He has increased atelectasis and collapse involving the left hemithorax. The right chest unchanged -he has not tolerated conservative interventions for recruitment and with High fio2 needs I worry that standard bronchoscopy would be risky for worsening hypoxia as well as I'm not sure that it would not require it again. I think best option is intubation-->bronch-->PEEP Plan Intubate/bronch f/b peep rx Add mucomyst Cont HT saline neb PAD protocol  Day 6 vanc and zosyn (10d rx) F/u am cxr    Dysphagia. - seen by speech  therapy 4/05 >> regular diet Plan NPO  CKD 2.  serum creatinine had increased, now improved.  Looks like baseline somewhere between 1.2 and 1.3 Plan Cont strict I&O Am chemistry   Dementia ? Cognitive decline. Vs element of delirium Plan Supportive care Add thiamine and folate   Anemia of critical illness. -Hemoglobin has remained stable. Plan Trend cbc  Add LMWH when OK w/ surgery  Transfuse per protocol for Hgb < 7    DVT prophylaxis -PAS hose SUP - Protonix Nutrition - NPO for surgery Goals of care - full code  My ccm time 50 minutes excluding procedures   Erick Colace ACNP-BC [place] Pager # [phone] OR # [phone] if no answer

## 2017-11-05 NOTE — Progress Notes (Signed)
2 Days Post-Op Procedure(s) (LRB): FLEXIBLE BRONCHOSCOPY (Left) VIDEO ASSISTED THORACOSCOPY (VATS) FOR DRAINAGE OF PLEURAL EFFUSION (Left) Subjective: Hurts when I cough  Objective: Vital signs in last 24 hours: Temp:  [97.5 F (36.4 C)-97.9 F (36.6 C)] 97.8 F (36.6 C) (04/09 0817) Pulse Rate:  [48-97] 62 (04/09 0700) Cardiac Rhythm: Sinus bradycardia (04/09 0400) Resp:  [9-26] 15 (04/09 0700) BP: (101-136)/(69-92) 109/78 (04/09 0700) SpO2:  [94 %-100 %] 99 % (04/09 0700) Arterial Line BP: (139)/(68) 139/68 (04/08 0900) Weight:  [192 lb 7.4 oz (87.3 kg)] 192 lb 7.4 oz (87.3 kg) (04/09 0500)  Hemodynamic parameters for last 24 hours:    Intake/Output from previous day: 04/08 0701 - 04/09 0700 In: 3330 [P.O.:480; I.V.:2300; IV Piggyback:550] Out: 6606 [Urine:1510; Chest Tube:185] Intake/Output this shift: No intake/output data recorded.  General appearance: alert, cooperative and mild distress Neurologic: no focal deficit Heart: regular rate and rhythm Lungs: absent BS on left no air leak, serosanguinous drainage from CT  Lab Results: Recent Labs    11/04/17 0540 11/05/17 0548  WBC 15.2* 11.1*  HGB 9.4* 9.1*  HCT 29.7* 28.7*  PLT 304 292   BMET:  Recent Labs    11/04/17 0540 11/05/17 0548  NA 135 137  K 4.2 3.9  CL 104 107  CO2 22 23  GLUCOSE 102* 96  BUN 16 13  CREATININE 1.06 1.08  CALCIUM 7.8* 7.7*    PT/INR:  Recent Labs    11/03/17 0115  LABPROT 14.6  INR 1.15   ABG    Component Value Date/Time   PHART 7.409 11/04/2017 0553   HCO3 24.0 11/04/2017 0553   TCO2 41 09/28/2016 0957   ACIDBASEDEF 0.1 11/04/2017 0553   O2SAT 97.9 11/04/2017 0553   CBG (last 3)  Recent Labs    11/04/17 0354 11/04/17 0757 11/05/17 0810  GLUCAP 104* 117* 125*    Assessment/Plan: S/P Procedure(s) (LRB): FLEXIBLE BRONCHOSCOPY (Left) VIDEO ASSISTED THORACOSCOPY (VATS) FOR DRAINAGE OF PLEURAL EFFUSION (Left) -POD # 2 decortication Has progressive  atelectasis on left with near complete atelectasis D/w pulmonary - plan is for elective intubation and bronch Will keep all CT to suction for now Continue antibiotics   LOS: 5 days    Dan Salazar 11/05/2017

## 2017-11-05 NOTE — Progress Notes (Signed)
Initial Nutrition Assessment  DOCUMENTATION CODES:   Not applicable  INTERVENTION:   Tube Feeding:  With current propofol, Vital High Protein @ 60 ml/hr Provides 1440 kcals, 127 g of protein and 1210 mL of free water Meets 100% protein needs  TF regimen and propofol at current rate providing 1923  total kcal/day (100 % of kcal needs)   NUTRITION DIAGNOSIS:   Inadequate oral intake related to acute illness as evidenced by NPO status.  GOAL:   Provide needs based on ASPEN/SCCM guidelines  MONITOR:   Vent status, TF tolerance, Labs, Weight trends  REASON FOR ASSESSMENT:   Consult Assessment of nutrition requirement/status  ASSESSMENT:    57 yo male admitted with moderate to large left-sided pleural effusion with adjacent left lung collapse, aspiration penumonia with septic shock. Pt with hx of HTN, hernia repair    4/7 VATS for drainage of empyema and decortication 4/8 Elective intubation with bronch  Patient is currently intubated on ventilator support MV: 11.4 L/min Temp (24hrs), Avg:97.7 F (36.5 C), Min:97.5 F (36.4 C), Max:98 F (36.7 C)  Propofol: 18.3 ml/hr  Weight up since admission, net + 5L since admission  Labs: reviewed Meds: NS at 100 ml/hr, thiamine, folic acid  NUTRITION - FOCUSED PHYSICAL EXAM:    Most Recent Value  Orbital Region  No depletion  Upper Arm Region  No depletion  Thoracic and Lumbar Region  No depletion  Buccal Region  No depletion  Temple Region  Mild depletion  Clavicle Bone Region  No depletion  Clavicle and Acromion Bone Region  No depletion  Scapular Bone Region  No depletion  Dorsal Hand  Unable to assess  Patellar Region  No depletion  Anterior Thigh Region  No depletion  Posterior Calf Region  No depletion  Edema (RD Assessment)  None       Diet Order:  Diet full liquid Room service appropriate? Yes; Fluid consistency: Thin  EDUCATION NEEDS:   Not appropriate for education at this time  Skin:  Skin  Assessment: Reviewed RN Assessment  Last BM:  4/9  Height:   Ht Readings from Last 1 Encounters:  11/02/17 6\' 2"  (1.88 m)    Weight:   Wt Readings from Last 1 Encounters:  11/05/17 192 lb 7.4 oz (87.3 kg)    Ideal Body Weight:     BMI:  Body mass index is 24.71 kg/m.  Estimated Nutritional Needs:   Kcal:  1915 kcals   Protein:  123-148 g  Fluid:  >/= 1.9 L   Kerman Passey MS, RD, LDN, CNSC [phone] Pager  [phone] Weekend/On-Call Pager

## 2017-11-05 NOTE — Procedures (Signed)
Bronchoscopy Procedure Note Magnum Lunde [id number] Dec 07, 1960  Procedure: Bronchoscopy Indications: Remove secretions  Procedure Details Consent: Risks of procedure as well as the alternatives and risks of each were explained to the (patient/caregiver).  Consent for procedure obtained. Time Out: Verified patient identification, verified procedure, site/side was marked, verified correct patient position, special equipment/implants available, medications/allergies/relevent history reviewed, required imaging and test results available.  Performed  In preparation for procedure, patient was given 100% FiO2 and bronchoscope lubricated. Sedation: Benzodiazepines  Airway entered and the following bronchi were examined: RUL, RML, RLL, LUL, LLL and Bronchi.   Procedures performed: bronchial alveolar lavage from the LUL and LLL w/ extensive thick yellow blood tinged secretion suctioned from the left upper and left lower bronchus  Bronchoscope removed.  , Patient placed back on 100% FiO2 at conclusion of procedure.    Evaluation Hemodynamic Status: BP stable throughout; O2 sats: stable throughout Patient's Current Condition: stable Specimens:  Not sent  Complications: No apparent complications Patient did tolerate procedure well.   Clementeen Graham 11/05/2017 Erick Colace ACNP-BC [place] Pager # [phone] OR # [phone] if no answer

## 2017-11-05 NOTE — Procedures (Signed)
Central Venous Catheter Insertion Procedure Note Dan Salazar [id number] [birth year]  Procedure: Insertion of Central Venous Catheter Indications: Assessment of intravascular volume, Drug and/or fluid administration and Frequent blood sampling  Procedure Details Consent: Risks of procedure as well as the alternatives and risks of each were explained to the (patient/caregiver).  Consent for procedure obtained. Time Out: Verified patient identification, verified procedure, site/side was marked, verified correct patient position, special equipment/implants available, medications/allergies/relevent history reviewed, required imaging and test results available.  Performed Real time [place] used to ID and cannulate vessel  Maximum sterile technique was used including antiseptics, cap, gloves, gown, hand hygiene, mask and sheet. Skin prep: Chlorhexidine; local anesthetic administered A antimicrobial bonded/coated triple lumen catheter was placed in the left internal jugular vein using the Seldinger technique.  Evaluation Blood flow good Complications: No apparent complications Patient did tolerate procedure well. Chest X-ray ordered to verify placement.  CXR: pending.  Dan Salazar 11/05/2017, 10:14 AM  Erick Colace ACNP-BC [place] Pager # [phone] OR # [phone] if no answer

## 2017-11-05 NOTE — Procedures (Signed)
Intubation Procedure Note Dan Salazar [id number] Jul 04, 1961  Procedure: Intubation Indications: Respiratory insufficiency  Procedure Details Consent: Risks of procedure as well as the alternatives and risks of each were explained to the (patient/caregiver).  Consent for procedure obtained. Time Out: Verified patient identification, verified procedure, site/side was marked, verified correct patient position, special equipment/implants available, medications/allergies/relevent history reviewed, required imaging and test results available.  Performed  Maximum sterile technique was used including antiseptics, cap, gloves, hand hygiene and mask.  MAC and 4    Evaluation Hemodynamic Status: BP stable throughout; O2 sats: stable throughout Patient's Current Condition: stable Complications: No apparent complications Patient did tolerate procedure well. Chest X-ray ordered to verify placement.  CXR: pending.   Clementeen Graham 11/05/2017  Erick Colace ACNP-BC [place] Pager # [phone] OR # [phone] if no answer

## 2017-11-05 NOTE — Progress Notes (Signed)
[facility] Progress Note Patient Name: Rhonda Vangieson DOB: [birth year] MRN: [id number]   Date of Service  11/05/2017  HPI/Events of Note  Agitation - Patient trying to pull out ETT. Request for bilateral soft wrist restraints.   eICU Interventions  Will order bilateral soft wrist restraints.      Intervention Category Minor Interventions: Agitation / anxiety - evaluation and management  Lysle Dingwall 11/05/2017, 7:37 PM

## 2017-11-06 ENCOUNTER — Inpatient Hospital Stay (HOSPITAL_COMMUNITY): Payer: Self-pay

## 2017-11-06 LAB — CULTURE, BLOOD (ROUTINE X 2)
Culture: NO GROWTH
Culture: NO GROWTH
SPECIAL REQUESTS: ADEQUATE
Special Requests: ADEQUATE

## 2017-11-06 LAB — BLOOD GAS, ARTERIAL
ACID-BASE EXCESS: 0.8 mmol/L (ref 0.0–2.0)
Bicarbonate: 24.6 mmol/L (ref 20.0–28.0)
Drawn by: 347621
FIO2: 40
O2 SAT: 97.7 %
PEEP/CPAP: 8 cmH2O
PH ART: 7.439 (ref 7.350–7.450)
Patient temperature: 98.1
RATE: 18 resp/min
VT: 650 mL
pCO2 arterial: 36.8 mmHg (ref 32.0–48.0)
pO2, Arterial: 89.8 mmHg (ref 83.0–108.0)

## 2017-11-06 LAB — GLUCOSE, CAPILLARY
GLUCOSE-CAPILLARY: 110 mg/dL — AB (ref 65–99)
GLUCOSE-CAPILLARY: 116 mg/dL — AB (ref 65–99)
Glucose-Capillary: 168 mg/dL — ABNORMAL HIGH (ref 65–99)
Glucose-Capillary: 98 mg/dL (ref 65–99)
Glucose-Capillary: 82 mg/dL (ref 65–99)
Glucose-Capillary: 132 mg/dL — ABNORMAL HIGH (ref 65–99)

## 2017-11-06 LAB — PHOSPHORUS
Phosphorus: 3.4 mg/dL (ref 2.5–4.6)
Phosphorus: 3.7 mg/dL (ref 2.5–4.6)

## 2017-11-06 LAB — MAGNESIUM
Magnesium: 1.9 mg/dL (ref 1.7–2.4)
Magnesium: 1.9 mg/dL (ref 1.7–2.4)

## 2017-11-06 LAB — PROCALCITONIN

## 2017-11-06 IMAGING — DX DG CHEST 1V PORT
1 series · 1 of 1 positions shown · non-contrast
Comparison: [DATE]

CLINICAL DATA: Follow-up VATS

EXAM:
PORTABLE CHEST 1 VIEW

[chest]
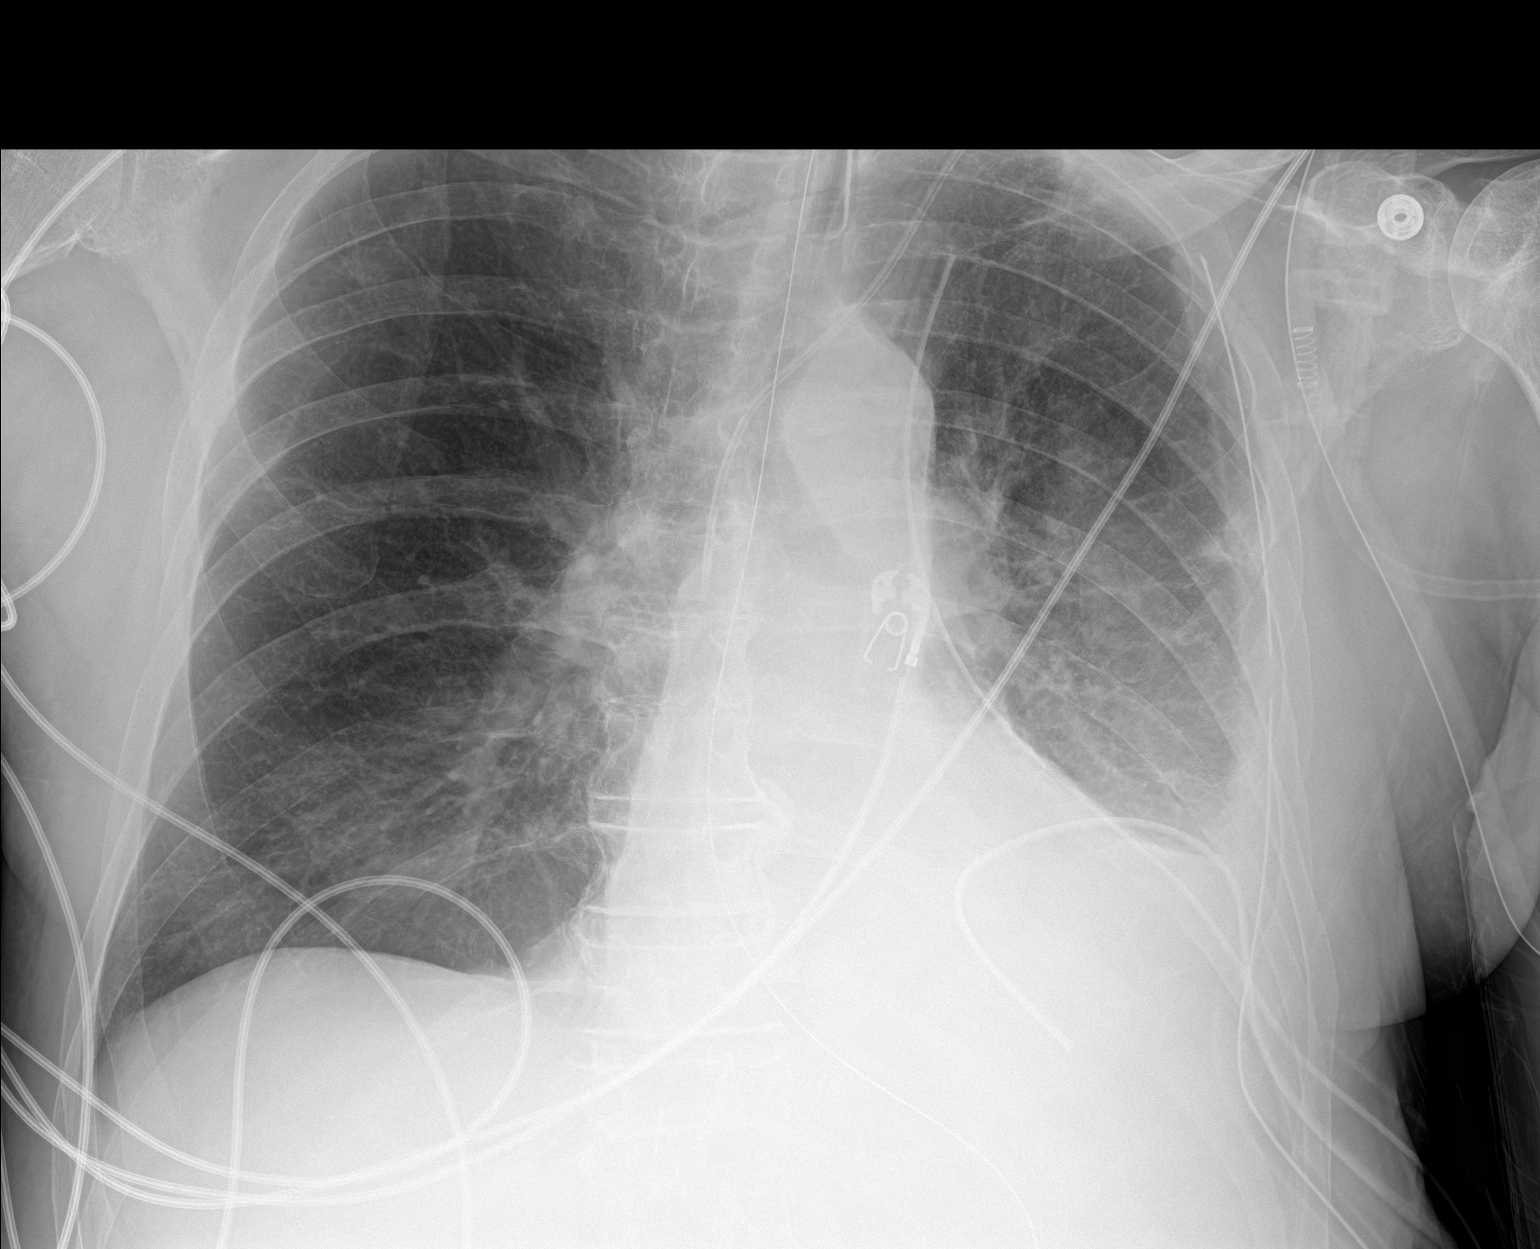

[1 of 1 positions shown; findings below may reference images not displayed]

FINDINGS: Endotracheal tube, nasogastric catheter and left jugular central
line are again seen and stable. Three chest tubes are again noted on
the left. Mild left basilar atelectasis is again noted and stable.
No significant pneumothorax is seen.
IMPRESSION: Postoperative change with tubes and lines as described.

Stable changes in the left base.

## 2017-11-06 IMAGING — DX DG CHEST 1V PORT
2 series · 2 of 2 positions shown · non-contrast
Comparison: Portable chest x-ray [DATE] at [DATE] a.m..

CLINICAL DATA: Status post removal of a chest tube on the left.

EXAM:
PORTABLE CHEST 1 VIEW

[chest ap (1 of 2)]
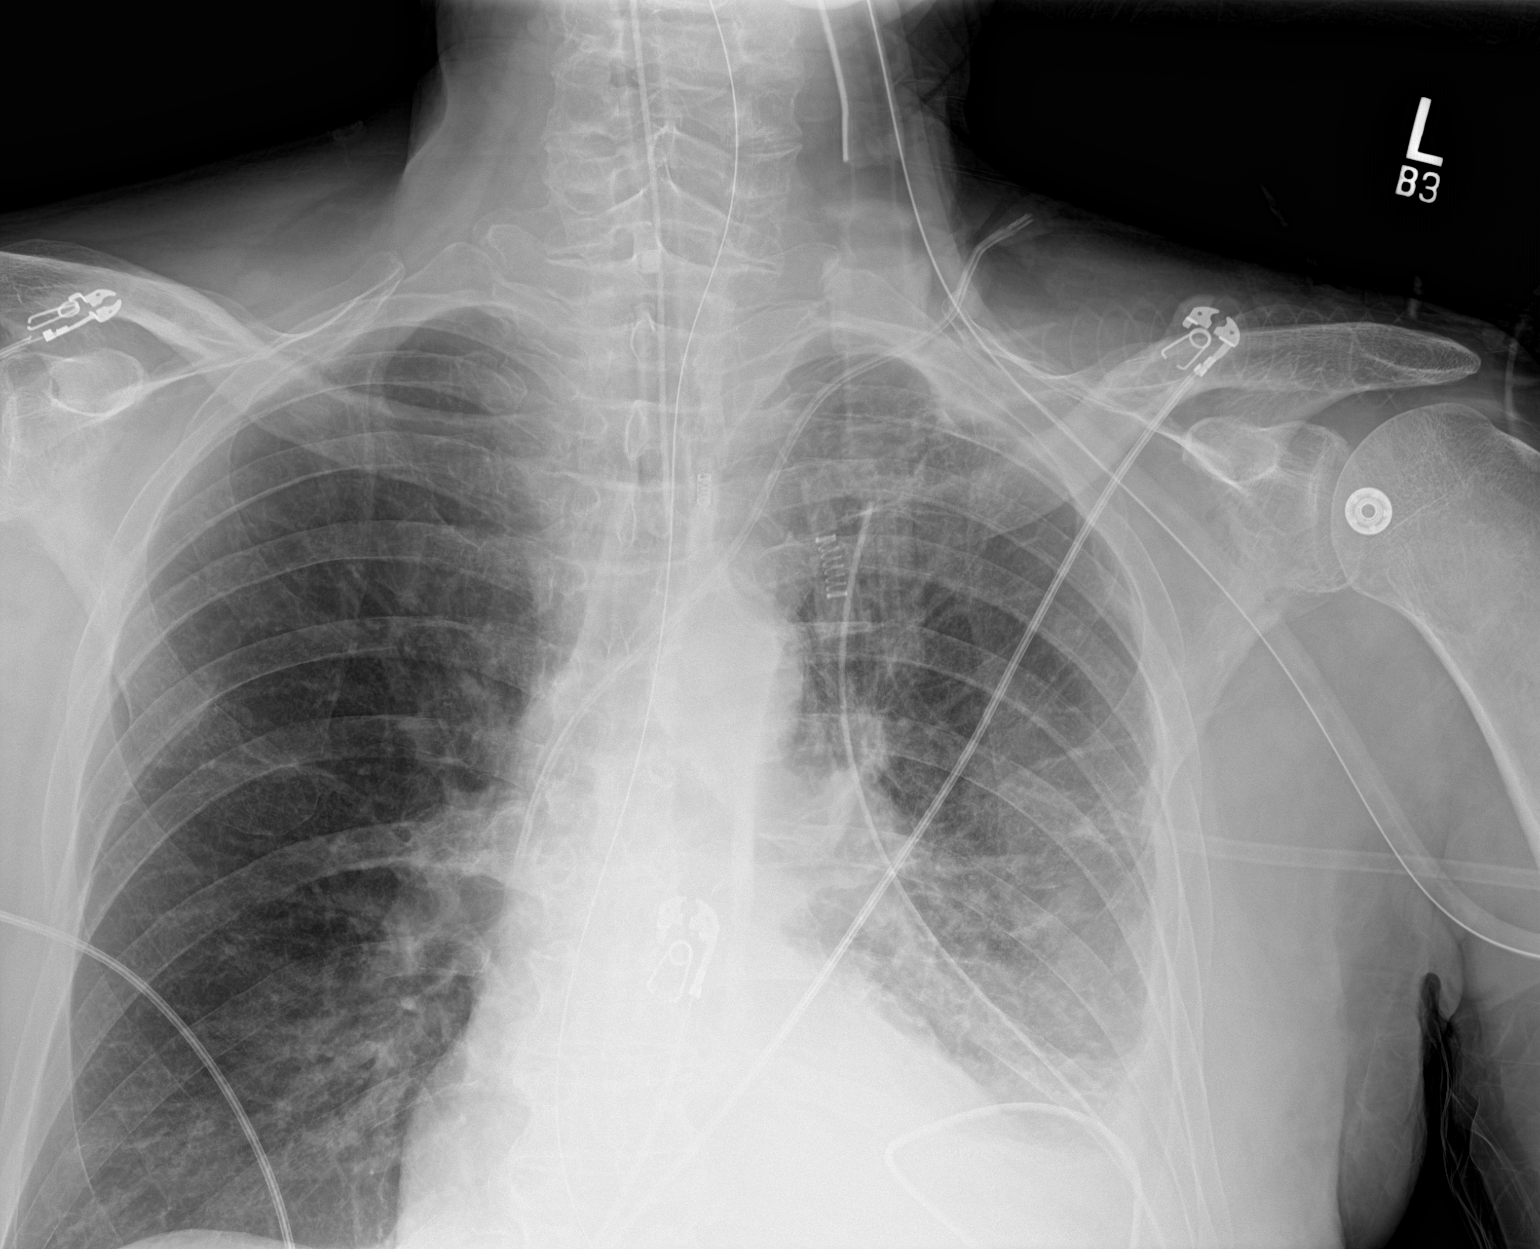

[chest ap (2 of 2)]
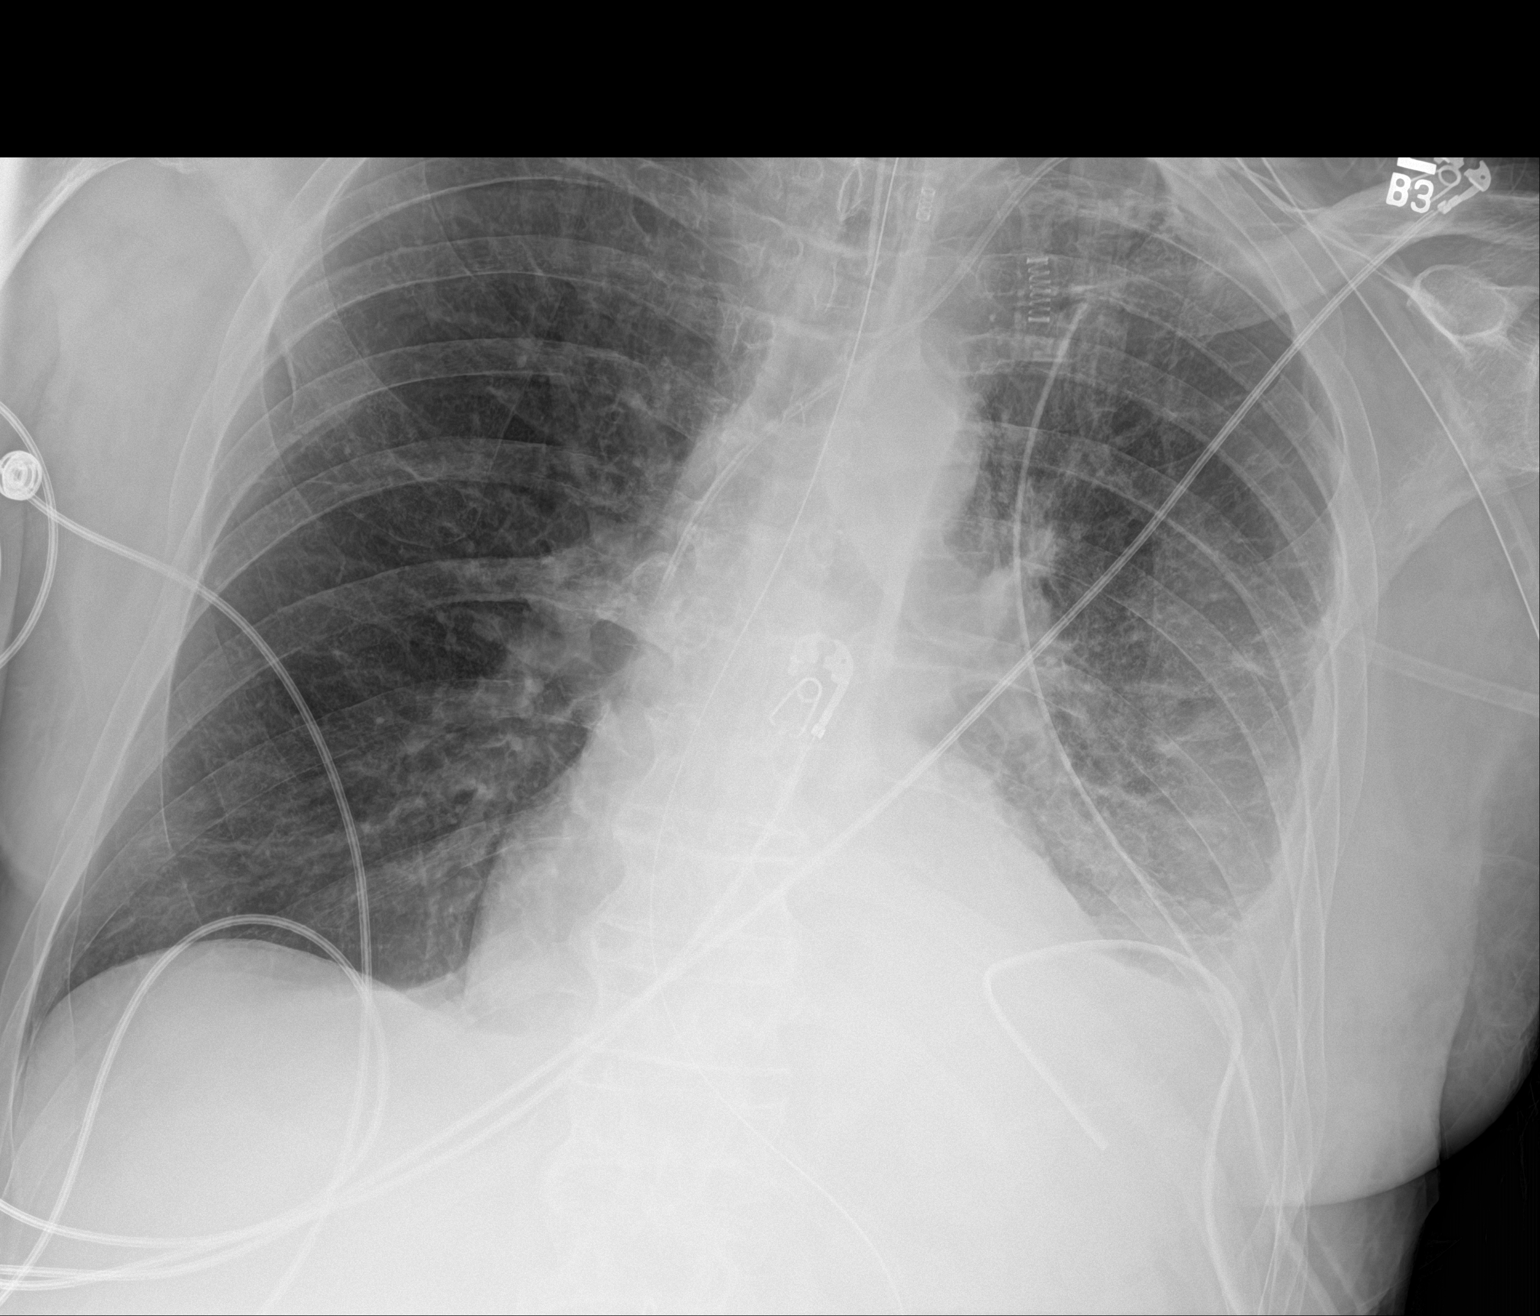

[2 of 2 positions shown; findings below may reference images not displayed]

FINDINGS: The patient has undergone removal of the lateral chest tube on the
left. The medial chest tube remains in place with the tip in the
medial interspace of the posterior fourth and fifth ribs. The lower
chest tube projects over the costophrenic gutter. There is a faint
pleural line in the pulmonary apex consistent with a sliver of a
pneumothorax. The right lung is clear. There is no mediastinal
shift. The endotracheal tube, esophagogastric tube, and left
internal jugular venous catheter appears stable.
IMPRESSION: Persistent tiny left apical pneumothorax following left chest tube
removal. The 2 remaining chest tubes are in stable position. Stable
small left pleural effusion.

## 2017-11-06 MED ORDER — HEPARIN SODIUM (PORCINE) 5000 UNIT/ML IJ SOLN
5000.0000 [IU] | Freq: Three times a day (TID) | INTRAMUSCULAR | Status: DC
  Administered 2017-11-06 – 2017-11-14 (×25): 5000 [IU] via SUBCUTANEOUS
  Filled 2017-11-06 (×24): qty 1

## 2017-11-06 MED ORDER — ACETAMINOPHEN 160 MG/5ML PO SOLN
1000.0000 mg | Freq: Four times a day (QID) | ORAL | Status: AC
  Administered 2017-11-06 – 2017-11-08 (×6): 1000 mg via ORAL
  Filled 2017-11-06 (×6): qty 40.6

## 2017-11-06 MED ORDER — DEXMEDETOMIDINE HCL IN NACL 200 MCG/50ML IV SOLN
0.0000 ug/kg/h | INTRAVENOUS | Status: DC
  Administered 2017-11-06: 0.4 ug/kg/h via INTRAVENOUS
  Administered 2017-11-06: 0.8 ug/kg/h via INTRAVENOUS
  Filled 2017-11-06 (×2): qty 50

## 2017-11-06 NOTE — Progress Notes (Signed)
Spent on pressure support all morning.  Decreased PEEP to 5, tolerating this well.  Frequency tidal volume ratio looks reassuring.  X-ray remains stable following reduction of PEEP.  I am concerned about his poor cough mechanics, however I do not believe another day on the ventilator will fix that. Plan  extubate Wean Precedex off Aggressive pulmonary hygiene measures  repeat chest x-ray a.m. If he plugs off again we may be looking at more prolonged ventilator support   Erick Colace ACNP-BC [place] Pager # [phone] OR # [phone] if no answer

## 2017-11-06 NOTE — Progress Notes (Signed)
PULMONARY / CRITICAL CARE MEDICINE   Name: Dan Salazar MRN: [id number] DOB: 1961/02/12    ADMISSION DATE:  10/31/2017 CONSULTATION DATE:  11/01/2017  REFERRING MD:  Dr. Maudie Mercury, [facility]  CHIEF COMPLAINT:  Short of breath  HISTORY OF PRESENT ILLNESS:   57 yo male dyspnea, hypoxia, flank pain, weight loss and cough.  Found to have aspiration pneumonia and Lt pleural effusion.  PMHx of HTN, CKD 1.  SUBJECTIVE:  Appears comfortable on exam  VITAL SIGNS: Blood Pressure 109/77 (BP Location: Left Arm)   Pulse 87   Temperature 97.8 F (36.6 C) (Oral)   Respiration (Abnormal) 22   Height 6\' 2"  (1.88 m)   Weight 189 lb 6 oz (85.9 kg)   Oxygen Saturation 98%   Body Mass Index 24.31 kg/m   Intake/Output Summary (Last 24 hours) at 11/06/2017 0842 Last data filed at 11/06/2017 0800 Gross per 24 hour  Intake 4742.97 ml  Output 2316 ml  Net 2426.97 ml    INTAKE / OUTPUT: I/O last 3 completed shifts: In: 0350 [I.V.:4234.3; NG/GT:758.7; IV Piggyback:800] Out: 3320 [Urine:3175; Chest Tube:145]  PHYSICAL EXAMINATION: General: This is a 57 year old white male is currently sedated on propofol infusion HEENT: Normocephalic atraumatic mucous membranes are moist.  Poor dentition, orally intubated with #8 endotracheal tube Pulmonary: Diffuse and coarse rhonchi, equal air entry bilateral.  No accessory use.  Chest tubes unremarkable with decreasing drainage.  No air leak. Cardiac: Regular rate and rhythm Abdomen: Soft nontender Extremities/musculoskeletal: Warm dry, brisk cap refill, strong pulses. Neuro/psych: Currently sedated. LABS:  BMET Recent Labs  Lab 11/03/17 0115 11/04/17 0540 11/05/17 0548  NA 135 135 137  K 4.0 4.2 3.9  CL 101 104 107  CO2 24 22 23   BUN 19 16 13   CREATININE 1.36* 1.06 1.08  GLUCOSE 115* 102* 96    Electrolytes Recent Labs  Lab 11/03/17 0115 11/04/17 0540 11/05/17 0548 11/05/17 1026 11/05/17 1735 11/06/17 0334  CALCIUM 8.1* 7.8* 7.7*  --   --   --    MG  --  1.9  --  2.0 1.9 1.9  PHOS  --  4.6  --  3.2 3.1 3.4    CBC Recent Labs  Lab 11/03/17 0115 11/04/17 0540 11/05/17 0548  WBC 16.3* 15.2* 11.1*  HGB 9.7* 9.4* 9.1*  HCT 30.0* 29.7* 28.7*  PLT 314 304 292    Coag's Recent Labs  Lab 11/01/17 0100 11/03/17 0115  APTT  --  31  INR 1.28 1.15    Sepsis Markers Recent Labs  Lab 11/01/17 0100 11/01/17 0402 11/01/17 0646  11/03/17 0115 11/05/17 1026 11/06/17 0334  LATICACIDVEN 2.3* 1.5 1.6  --   --   --   --   PROCALCITON 0.93  --   --    < > 0.39 0.10 <0.10   < > = values in this interval not displayed.    ABG Recent Labs  Lab 11/04/17 0553 11/05/17 1125 11/06/17 0355  PHART 7.409 7.448 7.439  PCO2ART 38.6 35.1 36.8  PO2ART 102 72.3* 89.8    Liver Enzymes Recent Labs  Lab 11/01/17 0129 11/03/17 0115 11/05/17 0548  AST 16 40 11*  ALT 21 47 16*  ALKPHOS 66 134* 85  BILITOT 1.0 0.6 0.8  ALBUMIN 2.4* 2.1* 1.9*    Cardiac Enzymes Recent Labs  Lab 10/31/17 2335 11/01/17 0646  TROPONINI 0.18* <0.03    Glucose Recent Labs  Lab 11/05/17 0810 11/05/17 1137 11/05/17 1615 11/05/17 1936 11/05/17 2318 11/06/17 0938  GLUCAP 125* 99 109* 145* 122* 116*    Imaging Dg Chest Port 1 View  Result Date: 11/06/2017 CLINICAL DATA:  Follow-up VATS EXAM: PORTABLE CHEST 1 VIEW COMPARISON:  11/05/2017 FINDINGS: Endotracheal tube, nasogastric catheter and left jugular central line are again seen and stable. Three chest tubes are again noted on the left. Mild left basilar atelectasis is again noted and stable. No significant pneumothorax is seen. IMPRESSION: Postoperative change with tubes and lines as described. Stable changes in the left base. Electronically Signed   By: Inez Catalina M.D.   On: 11/06/2017 07:22   Dg Chest Port 1 View  Result Date: 11/05/2017 CLINICAL DATA:  Status post endotracheal and esophageal intubation. EXAM: PORTABLE CHEST 1 VIEW COMPARISON:  Portable chest x-ray of today's date at  4:08 a.m. FINDINGS: The endotracheal tube tip lies approximately 7.5 cm above the carina and is at the level of the inferior margin of the clavicular heads. The esophagogastric tubes proximal port and tip project below the GE junction below the inferior margin of the image. The 3 left chest tubes are in stable position. The interstitial markings of the left lung are increased but the volume of pleural fluid has decreased. The right lung is clear. The heart and pulmonary vascularity are normal. The left internal jugular venous catheter tip projects over the midportion of the SVC. IMPRESSION: Intubation of the trachea and esophagus with reasonable positioning of the tubes. Decreased opacification of the left hemithorax with improved aeration of the left lung. No pneumothorax. The 3 left chest tubes are in stable position. Electronically Signed   By: David  Martinique M.D.   On: 11/05/2017 10:25     STUDIES:  CT angio chest 4/04 >> debris in Lt main bronchus, consolidation LLL, loculated Lt pleural effusion CT head 4/04 >> atrophy, microvascular ischemic changes Echo 4/05 >> EF 65 to 70%, PAS 38 mmHg  CULTURES: Blood 4/04 >> Respiratory viral panel 4/05 >> negative HIV 4/05 >> negative Pneumococcal Ag 4/05 >> negative Legionella Ag 4/05 >> negative  ANTIBIOTICS: Vancomycin 4/04 >> Zosyn 4/04 >>   Procedures OETT 4/9>>> Bronch 4/9 Left ij CVL 4/9>>>   SIGNIFICANT EVENTS: 4/04 Admit 4/06 Off pressors, transferred to [facility], [place] consulted  RESOLVED ISSUES RI Septic shock   ASSESSMENT / PLAN:  Acute hypoxic respiratory failure in the setting of aspiration pneumonia Complicated by loculated Lt pleural effusion/Empyema status post left video-assisted thoracotomy on 4/7 now with postoperative atelectasis Portable chest x-ray review gas personally reviewed, this demonstrates remarkable improvement in aeration compared to pre-bronchoscopy film: On 4/9.  He still has some left basilar volume loss  however aeration is much improved overall.  Endotracheal tube as well as chest tube and central line all in satisfactory position. I am hopeful now that we have recruited the left lung we can keep it that way.  He looks ready to wean Plan Initiate pressure support trials Change PAD protocol for RASS goal of 0, assess for possible extubation daily Continue aggressive pulmonary hygiene measures, he will continue to need Meta neb, spirometry, pain management, and mobilization following extubation Day #7 of 10 Vanco and Zosyn Chest tube management per thoracic surgery 1 more day of Mucomyst Follow-up chest x-ray a.m.   Dysphagia. - seen by speech therapy 4/05 >> regular diet Plan N.p.o. following extubation  CKD 2.  serum creatinine had increased, now improved.  Looks like baseline somewhere between 1.2 and 1.3 Plan Strict intake and output A.m. chemistry Renal dose medications as appropriate  Dementia ? Cognitive decline. Vs element of delirium Plan Thiamine and folate Supportive care PAD protocol RASS goal 0, transitioning from propofol to Precedex infusion  Anemia of critical illness. -Hemoglobin has remained stable. Plan Trend CBC Transfuse for hemoglobin less than 7 Starting subcutaneous heparin.   DVT prophylaxis -PAS hose, start subcutaneous heparin SUP - Protonix Nutrition - NPO for surgery Goals of care - full code  My cct 35 minutes    Erick Colace ACNP-BC [place] Pager # [phone] OR # [phone] if no answer

## 2017-11-06 NOTE — Progress Notes (Signed)
      [address]       [place] [address]             [phone]      Reintubated yesterday for mucous plugging. Bronchoscopy by CCM removed plugs  BP 109/77 (BP Location: Left Arm)   Pulse 87   Temp 97.8 F (36.6 C) (Oral)   Resp (!) 22   Ht 6\' 2"  (1.88 m)   Wt 189 lb 6 oz (85.9 kg)   SpO2 98%   BMI 24.31 kg/m   Intake/Output Summary (Last 24 hours) at 11/06/2017 0858 Last data filed at 11/06/2017 0800 Gross per 24 hour  Intake 4742.97 ml  Output 2316 ml  Net 2426.97 ml  90 ml from CT  Sedated on vent Lungs clear anteriorly, diminished at left base Cardiac RRR  CXR significantly improved, still with dense LLL consolidation  No air leak- will dc anterior tube  Remo Lipps C. Roxan Hockey, MD [facility] [phone]

## 2017-11-06 NOTE — Procedures (Signed)
Extubation Procedure Note  Patient Details:   Name: Dan Salazar DOB: 1960-11-12 MRN: [id number]   Airway Documentation:     Evaluation  O2 sats: stable throughout Complications: No apparent complications Patient did tolerate procedure well. Bilateral Breath Sounds: Diminished, Rhonchi   Yes   Pt extubated to 4L N/C.  No stridor noted.  RN @ bedside.  Donnetta Hail 11/06/2017, 1:04 PM

## 2017-11-06 NOTE — Progress Notes (Signed)
CPT via bed duration 10 min.  Post RR-22  Post HR 89.

## 2017-11-07 ENCOUNTER — Inpatient Hospital Stay (HOSPITAL_COMMUNITY): Payer: Self-pay

## 2017-11-07 LAB — COMPREHENSIVE METABOLIC PANEL
ALBUMIN: 1.9 g/dL — AB (ref 3.5–5.0)
ALT: 15 U/L — AB (ref 17–63)
AST: 14 U/L — AB (ref 15–41)
Alkaline Phosphatase: 124 U/L (ref 38–126)
Anion gap: 9 (ref 5–15)
BUN: 11 mg/dL (ref 6–20)
CHLORIDE: 107 mmol/L (ref 101–111)
CO2: 25 mmol/L (ref 22–32)
Calcium: 7.6 mg/dL — ABNORMAL LOW (ref 8.9–10.3)
Creatinine, Ser: 1.04 mg/dL (ref 0.61–1.24)
GFR calc Af Amer: >60 mL/min (ref >60)
GLUCOSE: 98 mg/dL (ref 65–99)
Potassium: 3.3 mmol/L — ABNORMAL LOW (ref 3.5–5.1)
SODIUM: 141 mmol/L (ref 135–145)
Total Bilirubin: 0.7 mg/dL (ref 0.3–1.2)
Total Protein: 5.1 g/dL — ABNORMAL LOW (ref 6.5–8.1)

## 2017-11-07 LAB — BLOOD GAS, ARTERIAL
Acid-Base Excess: 0.6 mmol/L (ref 0.0–2.0)
BICARBONATE: 23.7 mmol/L (ref 20.0–28.0)
Drawn by: 520751
FIO2: 100
LHR: 18 {breaths}/min
O2 SAT: 99.7 %
PATIENT TEMPERATURE: 98.6
PCO2 ART: 31.6 mmHg — AB (ref 32.0–48.0)
PEEP: 8 cmH2O
PH ART: 7.488 — AB (ref 7.350–7.450)
VT: 650 mL
pO2, Arterial: 327 mmHg — ABNORMAL HIGH (ref 83.0–108.0)

## 2017-11-07 LAB — PHOSPHORUS
PHOSPHORUS: 3.9 mg/dL (ref 2.5–4.6)
Phosphorus: 3.1 mg/dL (ref 2.5–4.6)

## 2017-11-07 LAB — CBC
HEMATOCRIT: 27.6 % — AB (ref 39.0–52.0)
Hemoglobin: 8.7 g/dL — ABNORMAL LOW (ref 13.0–17.0)
MCH: 30.1 pg (ref 26.0–34.0)
MCHC: 31.5 g/dL (ref 30.0–36.0)
MCV: 95.5 fL (ref 78.0–100.0)
Platelets: 340 10*3/uL (ref 150–400)
RBC: 2.89 MIL/uL — ABNORMAL LOW (ref 4.22–5.81)
RDW: 15 % (ref 11.5–15.5)
WBC: 9.9 10*3/uL (ref 4.0–10.5)

## 2017-11-07 LAB — MAGNESIUM
Magnesium: 1.9 mg/dL (ref 1.7–2.4)
Magnesium: 1.8 mg/dL (ref 1.7–2.4)

## 2017-11-07 LAB — VITAMIN B1: Vitamin B1 (Thiamine): 132 nmol/L (ref 66.5–200.0)

## 2017-11-07 LAB — PROCALCITONIN

## 2017-11-07 LAB — GLUCOSE, CAPILLARY
GLUCOSE-CAPILLARY: 110 mg/dL — AB (ref 65–99)
GLUCOSE-CAPILLARY: 109 mg/dL — AB (ref 65–99)
GLUCOSE-CAPILLARY: 100 mg/dL — AB (ref 65–99)
Glucose-Capillary: 95 mg/dL (ref 65–99)
Glucose-Capillary: 111 mg/dL — ABNORMAL HIGH (ref 65–99)

## 2017-11-07 LAB — TRIGLYCERIDES: TRIGLYCERIDES: 108 mg/dL (ref <150)

## 2017-11-07 IMAGING — DX DG CHEST 1V PORT
1 series · 1 of 1 positions shown · non-contrast
Comparison: [DATE]

CLINICAL DATA: ETT AND OG PLACEMENT

EXAM:
PORTABLE CHEST 1 VIEW

[chest ap]
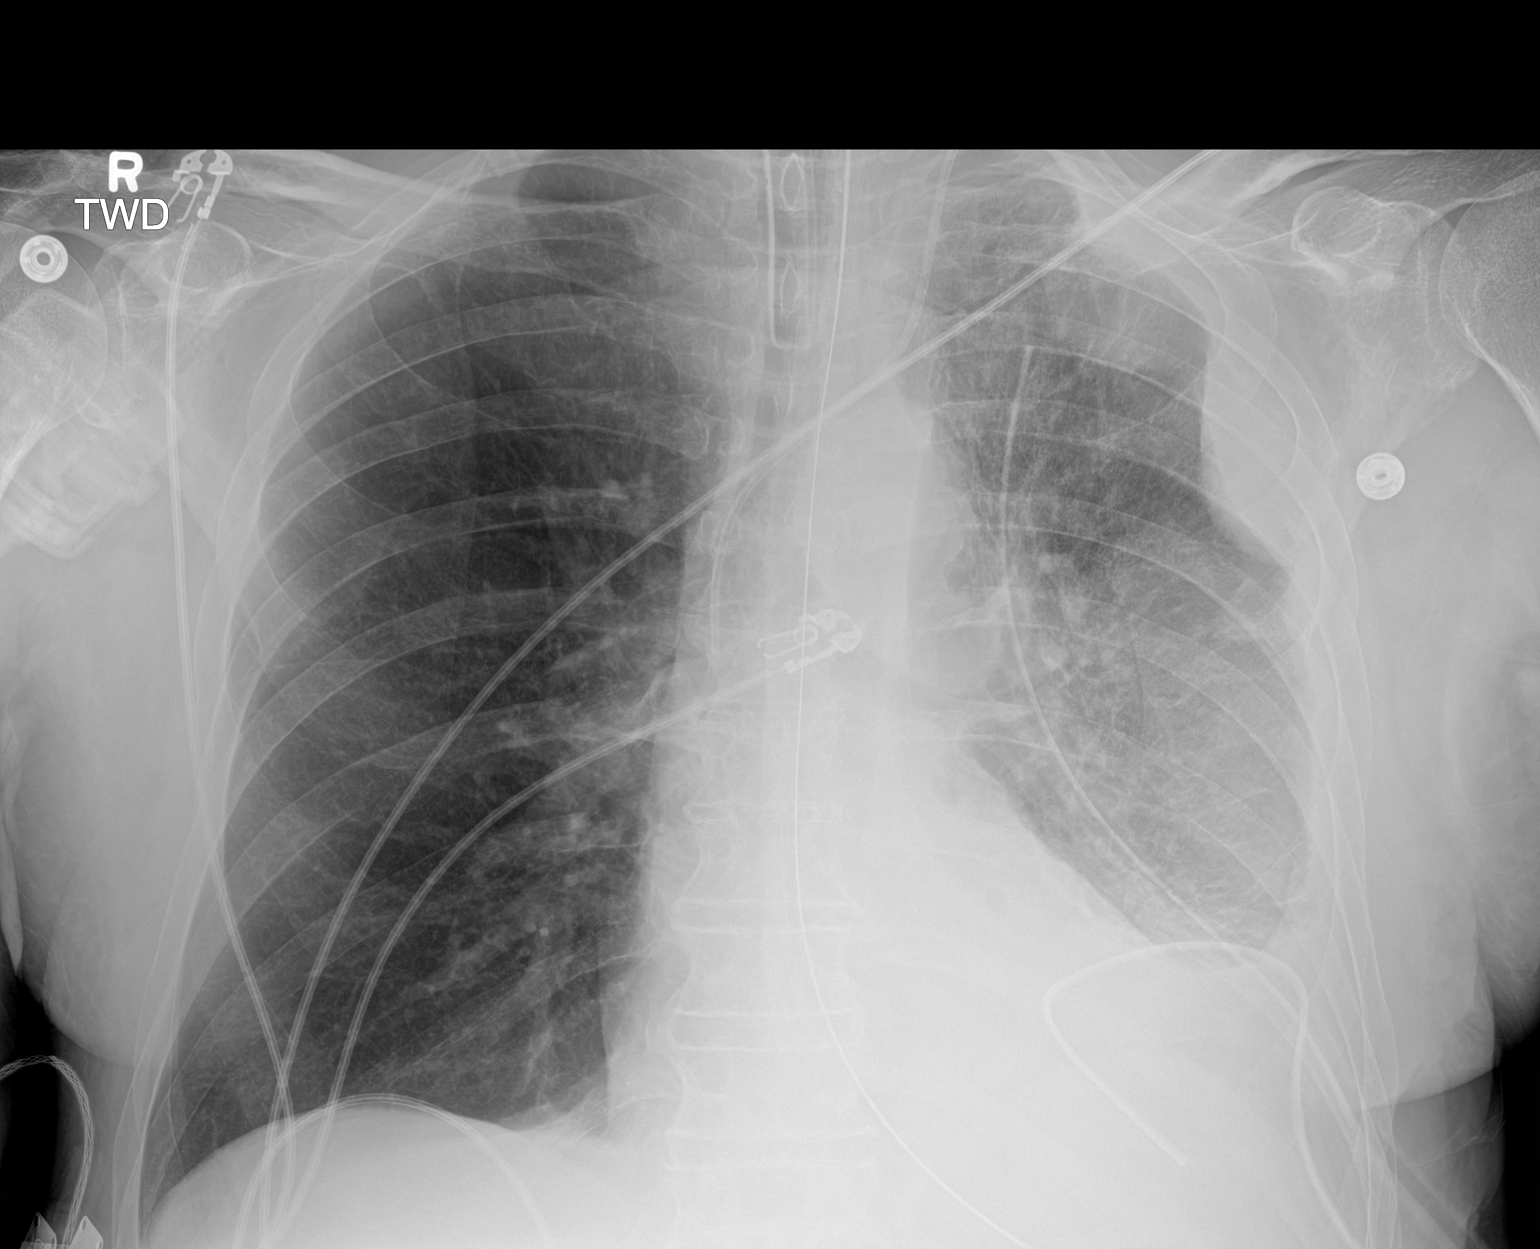

[1 of 1 positions shown; findings below may reference images not displayed]

FINDINGS: Endotracheal tube 7.5 cm from carina. Placement NG tube extends the
stomach.

Central venous line, 2 LEFT chest tubes unchanged. Marked reduction
in volume of LEFT pleural effusion. Loculated fluid remains. RIGHT
lung relatively clear.
IMPRESSION: 1. Endotracheal tube in good position.  NGT placed appropriately
2. Marked reduction in LEFT pleural effusion.

## 2017-11-07 IMAGING — DX DG CHEST 1V PORT
1 series · 1 of 1 positions shown · non-contrast
Comparison: Yesterday

CLINICAL DATA: Atelectasis

EXAM:
PORTABLE CHEST 1 VIEW

[chest]
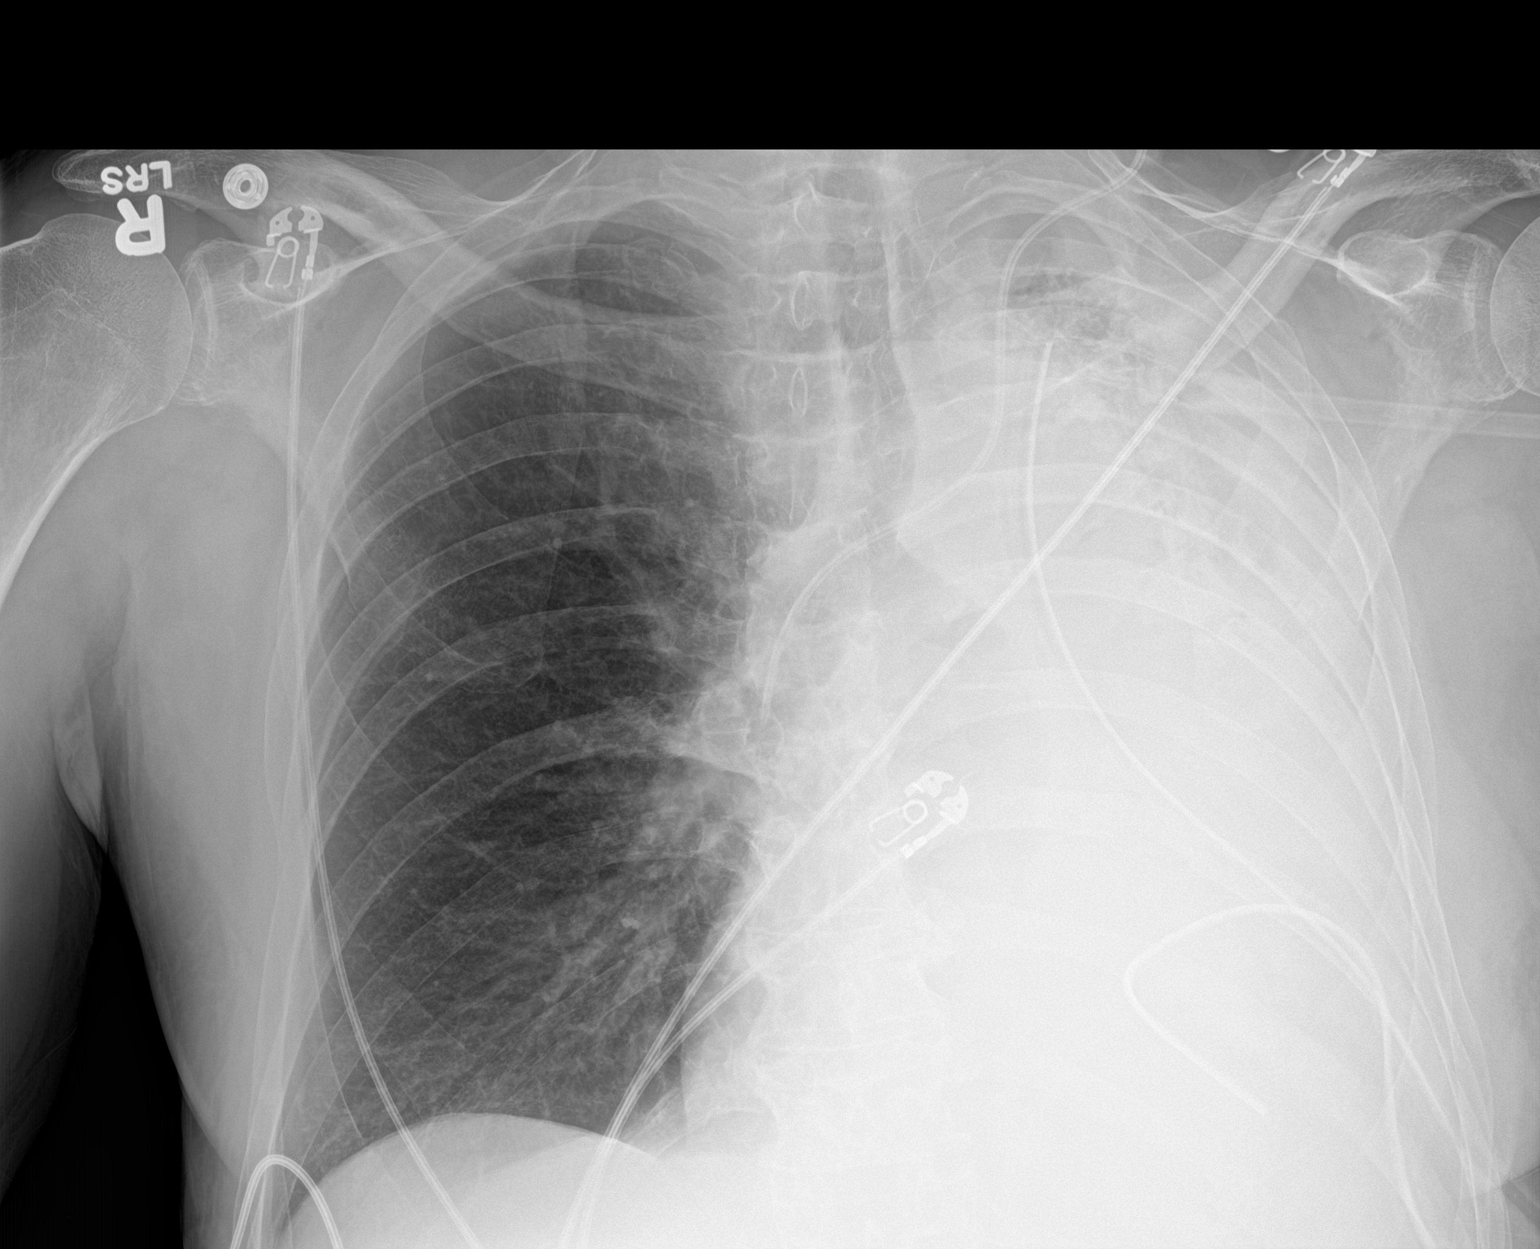

[1 of 1 positions shown; findings below may reference images not displayed]

FINDINGS: Chest tubes on the left are in stable position. Interval extubation
of the trachea and esophagus. Significant interval increase in left
lung collapse. The right lung is clear. No visible pneumothorax. No
gross change in heart size.
IMPRESSION: Extubation with significant interval left lung collapse

## 2017-11-07 MED ORDER — CHLORHEXIDINE GLUCONATE 0.12 % MT SOLN
15.0000 mL | Freq: Two times a day (BID) | OROMUCOSAL | Status: DC
  Administered 2017-11-07 – 2017-11-10 (×5): 15 mL via OROMUCOSAL

## 2017-11-07 MED ORDER — MIDAZOLAM HCL 2 MG/2ML IJ SOLN
INTRAMUSCULAR | Status: AC
  Administered 2017-11-07: 2 mg
  Filled 2017-11-07: qty 2

## 2017-11-07 MED ORDER — FENTANYL CITRATE (PF) 100 MCG/2ML IJ SOLN
INTRAMUSCULAR | Status: AC
  Administered 2017-11-07: 100 ug via INTRAVENOUS
  Filled 2017-11-07: qty 2

## 2017-11-07 MED ORDER — ORAL CARE MOUTH RINSE
15.0000 mL | Freq: Four times a day (QID) | OROMUCOSAL | Status: DC
  Administered 2017-11-07 – 2017-11-08 (×6): 15 mL via OROMUCOSAL

## 2017-11-07 MED ORDER — FENTANYL CITRATE (PF) 100 MCG/2ML IJ SOLN
100.0000 ug | INTRAMUSCULAR | Status: DC | PRN
  Administered 2017-11-07 (×2): 100 ug via INTRAVENOUS

## 2017-11-07 MED ORDER — VITAL HIGH PROTEIN PO LIQD
1000.0000 mL | ORAL | Status: DC
  Administered 2017-11-07: 1000 mL
  Filled 2017-11-07 (×2): qty 1000

## 2017-11-07 MED ORDER — ORAL CARE MOUTH RINSE: 15.0000 mL | Freq: Two times a day (BID) | OROMUCOSAL | Status: DC

## 2017-11-07 MED ORDER — VITAL HIGH PROTEIN PO LIQD
1000.0000 mL | ORAL | Status: DC
  Administered 2017-11-08 – 2017-11-09 (×3): 1000 mL
  Filled 2017-11-07 (×8): qty 1000

## 2017-11-07 MED ORDER — FENTANYL CITRATE (PF) 100 MCG/2ML IJ SOLN
100.0000 ug | INTRAMUSCULAR | Status: DC | PRN
  Filled 2017-11-07 (×2): qty 2

## 2017-11-07 MED ORDER — POTASSIUM CHLORIDE 20 MEQ/15ML (10%) PO SOLN
40.0000 meq | Freq: Once | ORAL | Status: AC
  Administered 2017-11-07: 40 meq
  Filled 2017-11-07: qty 30

## 2017-11-07 MED ORDER — VITAL HIGH PROTEIN PO LIQD
1000.0000 mL | ORAL | Status: DC
  Administered 2017-11-07: 1000 mL
  Filled 2017-11-07: qty 1000

## 2017-11-07 MED ORDER — FENTANYL CITRATE (PF) 100 MCG/2ML IJ SOLN
100.0000 ug | INTRAMUSCULAR | Status: DC | PRN
  Administered 2017-11-08: 100 ug via INTRAVENOUS
  Filled 2017-11-07: qty 2

## 2017-11-07 MED ORDER — ROCURONIUM BROMIDE 50 MG/5ML IV SOLN
100.0000 mg | Freq: Once | INTRAVENOUS | Status: AC
  Administered 2017-11-07: 100 mg via INTRAVENOUS

## 2017-11-07 MED ORDER — ETOMIDATE 2 MG/ML IV SOLN
20.0000 mg | Freq: Once | INTRAVENOUS | Status: AC
Start: 2017-11-07 — End: 2017-11-07
  Administered 2017-11-07: 20 mg via INTRAVENOUS

## 2017-11-07 MED ORDER — PROPOFOL 1000 MG/100ML IV EMUL
0.0000 ug/kg/min | INTRAVENOUS | Status: DC
  Administered 2017-11-07: 20 ug/kg/min via INTRAVENOUS
  Administered 2017-11-08: 37 ug/kg/min via INTRAVENOUS
  Administered 2017-11-08: 30.061 ug/kg/min via INTRAVENOUS
  Administered 2017-11-08: 32 ug/kg/min via INTRAVENOUS
  Administered 2017-11-08: 35 ug/kg/min via INTRAVENOUS
  Administered 2017-11-09 (×2): 37 ug/kg/min via INTRAVENOUS
  Filled 2017-11-07 (×9): qty 100

## 2017-11-07 MED ORDER — CHLORHEXIDINE GLUCONATE 0.12% ORAL RINSE (MEDLINE KIT)
15.0000 mL | Freq: Two times a day (BID) | OROMUCOSAL | Status: DC
  Administered 2017-11-07 – 2017-11-08 (×2): 15 mL via OROMUCOSAL

## 2017-11-07 MED ORDER — PRO-STAT SUGAR FREE PO LIQD
30.0000 mL | Freq: Two times a day (BID) | ORAL | Status: DC
  Administered 2017-11-07: 30 mL
  Filled 2017-11-07: qty 30

## 2017-11-07 MED ORDER — PROPOFOL 1000 MG/100ML IV EMUL
INTRAVENOUS | Status: AC
  Filled 2017-11-07: qty 100

## 2017-11-07 MED ORDER — FENTANYL CITRATE (PF) 100 MCG/2ML IJ SOLN
100.0000 ug | INTRAMUSCULAR | Status: AC | PRN
  Administered 2017-11-07: 100 ug via INTRAVENOUS
  Administered 2017-11-08 (×2): 50 ug via INTRAVENOUS
  Filled 2017-11-07 (×2): qty 2

## 2017-11-07 MED ORDER — IPRATROPIUM-ALBUTEROL 0.5-2.5 (3) MG/3ML IN SOLN
3.0000 mL | Freq: Three times a day (TID) | RESPIRATORY_TRACT | Status: DC
  Administered 2017-11-07 – 2017-11-13 (×18): 3 mL via RESPIRATORY_TRACT
  Filled 2017-11-07 (×21): qty 3

## 2017-11-07 NOTE — Procedures (Signed)
Intubation Procedure Note Dan Salazar [id number] September 22, 1960  Procedure: Intubation Indications: Respiratory insufficiency  Procedure Details Consent: Risks of procedure as well as the alternatives and risks of each were explained to the (patient/caregiver).  Consent for procedure obtained. Time Out: Verified patient identification, verified procedure, site/side was marked, verified correct patient position, special equipment/implants available, medications/allergies/relevent history reviewed, required imaging and test results available.  Performed  Maximum sterile technique was used including antiseptics, cap, gloves, hand hygiene and mask.  MAC and 4    Evaluation Hemodynamic Status: BP stable throughout; O2 sats: stable throughout Patient's Current Condition: stable Complications: No apparent complications Patient did tolerate procedure well. Chest X-ray ordered to verify placement.  CXR: tube position acceptable.   Clementeen Graham 11/07/2017  Erick Colace ACNP-BC [place] Pager # [phone] OR # [phone] if no answer

## 2017-11-07 NOTE — Progress Notes (Signed)
TCTS DAILY ICU PROGRESS NOTE                   [address]            [place] [address]          [phone]   4 Days Post-Op Procedure(s) (LRB): FLEXIBLE BRONCHOSCOPY (Left) VIDEO ASSISTED THORACOSCOPY (VATS) FOR DRAINAGE OF PLEURAL EFFUSION (Left)  Total Length of Stay:  LOS: 7 days   Subjective: Feels fair, left lung white out on CXR- preparing to intubate and bronch(CCM)  Objective: Vital signs in last 24 hours: Temp:  [97.7 F (36.5 C)-98.6 F (37 C)] 97.7 F (36.5 C) (04/11 0748) Pulse Rate:  [64-103] 68 (04/11 0800) Cardiac Rhythm: Normal sinus rhythm (04/11 0757) Resp:  [15-28] 19 (04/11 0800) BP: (89-125)/(66-84) 125/84 (04/11 0800) SpO2:  [92 %-100 %] 97 % (04/11 0800) FiO2 (%):  [40 %] 40 % (04/10 1937) Weight:  [193 lb 2 oz (87.6 kg)] 193 lb 2 oz (87.6 kg) (04/11 0455)  Filed Weights   11/05/17 0500 11/06/17 0500 11/07/17 0455  Weight: 192 lb 7.4 oz (87.3 kg) 189 lb 6 oz (85.9 kg) 193 lb 2 oz (87.6 kg)    Weight change: 3 lb 12 oz (1.7 kg)   Hemodynamic parameters for last 24 hours:    Intake/Output from previous day: 04/10 0701 - 04/11 0700 In: 3273.6 [I.V.:2723.6; IV Piggyback:550] Out: 2781 [Urine:2650; Stool:1; Chest Tube:130]  Intake/Output this shift: Total I/O In: 110 [I.V.:100; Other:10] Out: 0   Current Meds: Scheduled Meds: . acetaminophen (TYLENOL) oral liquid 160 mg/5 mL  1,000 mg Oral Q6H  . acetylcysteine  4 mL Nebulization TID  . chlorhexidine  15 mL Mouth Rinse BID  . chlorhexidine gluconate (MEDLINE KIT)  15 mL Mouth Rinse BID  . sennosides  5 mL Per Tube QHS   And  . docusate  100 mg Per Tube QHS  . feeding supplement (PRO-STAT SUGAR FREE 64)  30 mL Per Tube BID  . feeding supplement (VITAL HIGH PROTEIN)  1,000 mL Per Tube Q24H  . fentaNYL      . heparin injection (subcutaneous)  5,000 Units Subcutaneous Q8H  . ipratropium-albuterol  3 mL Nebulization TID  . mouth rinse  15 mL Mouth Rinse q12n4p  .  mouth rinse  15 mL Mouth Rinse QID  . midazolam      . potassium chloride  40 mEq Per Tube Once  . thiamine injection  100 mg Intravenous Daily   Continuous Infusions: . sodium chloride 100 mL/hr (11/06/17 2000)  . piperacillin-tazobactam (ZOSYN)  IV Stopped (11/07/17 0545)  . potassium chloride    . propofol (DIPRIVAN) infusion    . propofol    . vancomycin 1,000 mg (11/07/17 0555)   PRN Meds:.bisacodyl, fentaNYL (SUBLIMAZE) injection, fentaNYL (SUBLIMAZE) injection, ondansetron (ZOFRAN) IV, potassium chloride, sodium chloride flush, traMADol  General appearance: alert, cooperative and no distress Heart: regular rate and rhythm Lungs: di throughout left lung Abdomen: soft, nontender Extremities: PAS in place Wound: incis healing well  Lab Results: CBC: Recent Labs    11/05/17 0548 11/07/17 0544  WBC 11.1* 9.9  HGB 9.1* 8.7*  HCT 28.7* 27.6*  PLT 292 340   BMET:  Recent Labs    11/05/17 0548 11/07/17 0544  NA 137 141  K 3.9 3.3*  CL 107 107  CO2 23 25  GLUCOSE 96 98  BUN 13 11  CREATININE 1.08 1.04  CALCIUM 7.7* 7.6*  CMET: Lab Results  Component Value Date   WBC 9.9 11/07/2017   HGB 8.7 (L) 11/07/2017   HCT 27.6 (L) 11/07/2017   PLT 340 11/07/2017   GLUCOSE 98 11/07/2017   CHOL 101 11/01/2017   TRIG 201 (H) 11/05/2017   HDL 42 11/01/2017   LDLCALC 49 11/01/2017   ALT 15 (L) 11/07/2017   AST 14 (L) 11/07/2017   NA 141 11/07/2017   K 3.3 (L) 11/07/2017   CL 107 11/07/2017   CREATININE 1.04 11/07/2017   BUN 11 11/07/2017   CO2 25 11/07/2017   TSH 0.817 11/04/2017   INR 1.15 11/03/2017   HGBA1C 6.1 (H) 11/01/2017      PT/INR: No results for input(s): LABPROT, INR in the last 72 hours. Radiology: Dg Chest Port 1 View  Result Date: 11/07/2017 CLINICAL DATA:  Atelectasis EXAM: PORTABLE CHEST 1 VIEW COMPARISON:  Yesterday FINDINGS: Chest tubes on the left are in stable position. Interval extubation of the trachea and esophagus. Significant  interval increase in left lung collapse. The right lung is clear. No visible pneumothorax. No gross change in heart size. IMPRESSION: Extubation with significant interval left lung collapse Electronically Signed   By: Monte Fantasia M.D.   On: 11/07/2017 08:40   Dg Chest Port 1 View  Result Date: 11/06/2017 CLINICAL DATA:  Status post removal of a chest tube on the left. EXAM: PORTABLE CHEST 1 VIEW COMPARISON:  Portable chest x-ray of November 06, 2017 at 4:10 a.m. FINDINGS: The patient has undergone removal of the lateral chest tube on the left. The medial chest tube remains in place with the tip in the medial interspace of the posterior fourth and fifth ribs. The lower chest tube projects over the costophrenic gutter. There is a faint pleural line in the pulmonary apex consistent with a sliver of a pneumothorax. The right lung is clear. There is no mediastinal shift. The endotracheal tube, esophagogastric tube, and left internal jugular venous catheter appears stable. IMPRESSION: Persistent tiny left apical pneumothorax following left chest tube removal. The 2 remaining chest tubes are in stable position. Stable small left pleural effusion. Electronically Signed   By: David  Martinique M.D.   On: 11/06/2017 12:36   Recent Results (from the past 240 hour(s))  MRSA PCR Screening     Status: None   Collection Time: 10/31/17 11:03 PM  Result Value Ref Range Status   MRSA by PCR NEGATIVE NEGATIVE Final    Comment:        The GeneXpert MRSA Assay (FDA approved for NASAL specimens only), is one component of a comprehensive MRSA colonization surveillance program. It is not intended to diagnose MRSA infection nor to guide or monitor treatment for MRSA infections. Performed at [facility], [place] [address] [place], [place] [address]   Culture, blood (routine x 2) Call MD if unable to obtain prior to antibiotics being given     Status: None   Collection Time: 10/31/17 11:35 PM  Result  Value Ref Range Status   Specimen Description   Final    BLOOD LEFT ANTECUBITAL Performed at [facility] [address] [place], [place] [address]    Special Requests   Final    BOTTLES DRAWN AEROBIC AND ANAEROBIC Blood Culture adequate volume Performed at [facility] [address] [place], [place] [address]    Culture   Final    NO GROWTH 5 DAYS Performed at [facility], [place] [address]  [place], [place] [address]    Report Status 11/06/2017 FINAL  Final  Culture, blood (routine x 2) Call MD if unable to obtain prior to antibiotics being given     Status: None   Collection Time: 10/31/17 11:35 PM  Result Value Ref Range Status   Specimen Description   Final    BLOOD LEFT HAND Performed at [facility] [address], [place], [place] [address]    Special Requests   Final    BOTTLES DRAWN AEROBIC AND ANAEROBIC Blood Culture adequate volume Performed at [facility] [address], [place], [place] [address]    Culture   Final    NO GROWTH 5 DAYS Performed at [facility], [place] [address], [place], [place] [address]    Report Status 11/06/2017 FINAL  Final  Respiratory Panel by PCR     Status: None   Collection Time: 11/01/17  1:30 AM  Result Value Ref Range Status   Adenovirus NOT DETECTED NOT DETECTED Final   Coronavirus 229E NOT DETECTED NOT DETECTED Final   Coronavirus HKU1 NOT DETECTED NOT DETECTED Final   Coronavirus NL63 NOT DETECTED NOT DETECTED Final   Coronavirus OC43 NOT DETECTED NOT DETECTED Final   Metapneumovirus NOT DETECTED NOT DETECTED Final   Rhinovirus / Enterovirus NOT DETECTED NOT DETECTED Final   Influenza A NOT DETECTED NOT DETECTED Final   Influenza B NOT DETECTED NOT DETECTED Final   Parainfluenza Virus 1 NOT DETECTED NOT DETECTED Final   Parainfluenza Virus 2 NOT DETECTED NOT DETECTED Final   Parainfluenza Virus 3 NOT DETECTED NOT DETECTED Final    Parainfluenza Virus 4 NOT DETECTED NOT DETECTED Final   Respiratory Syncytial Virus NOT DETECTED NOT DETECTED Final   Bordetella pertussis NOT DETECTED NOT DETECTED Final   Chlamydophila pneumoniae NOT DETECTED NOT DETECTED Final   Mycoplasma pneumoniae NOT DETECTED NOT DETECTED Final    Comment: Performed at [facility], [place] [address], [place], [place] [address]  Culture, expectorated sputum-assessment     Status: None   Collection Time: 11/01/17 10:34 AM  Result Value Ref Range Status   Specimen Description SPUTUM  Final   Special Requests NONE  Final   Sputum evaluation   Final    THIS SPECIMEN IS ACCEPTABLE FOR SPUTUM CULTURE Performed at [facility], [place] [address], [place], [place] [address]    Report Status 11/01/2017 FINAL  Final  Culture, respiratory (NON-Expectorated)     Status: None   Collection Time: 11/01/17 10:34 AM  Result Value Ref Range Status   Specimen Description   Final    SPUTUM Performed at [employer] [address], [place], [place] [address]    Special Requests   Final    NONE Reflexed from [id number] Performed at [facility], [place] [address] [place], [place] [address]    Gram Stain   Final    ABUNDANT WBC PRESENT, PREDOMINANTLY PMN NO ORGANISMS SEEN    Culture   Final    Consistent with normal respiratory flora. Performed at [facility], [place] [address], [place], [place] [address]    Report Status 11/04/2017 FINAL  Final  Surgical PCR screen     Status: None   Collection Time: 11/03/17  9:13 AM  Result Value Ref Range Status   MRSA, PCR NEGATIVE NEGATIVE Final   Staphylococcus aureus NEGATIVE NEGATIVE Final    Comment: (NOTE) The Xpert SA Assay (FDA approved for  NASAL specimens in patients 41 years of age and older), is one component of a comprehensive surveillance program. It is not intended to diagnose infection nor to guide or monitor treatment. Performed at [facility], [place] [address], [place], [place] [address]   Culture, respiratory (NON-Expectorated)     Status: None   Collection Time: 11/03/17 11:16 AM  Result Value Ref Range Status   Specimen Description SPUTUM BRONCHIAL SPUTUM NOT WASHING  Final   Special Requests SPEC A  Final   Gram Stain   Final    ABUNDANT WBC PRESENT, PREDOMINANTLY PMN NO SQUAMOUS EPITHELIAL CELLS SEEN NO ORGANISMS SEEN    Culture   Final    NO GROWTH 2 DAYS Performed at [facility], [place] [address], [place], [place] [address]    Report Status 11/05/2017 FINAL  Final  Culture, body fluid-bottle     Status: None (Preliminary result)   Collection Time: 11/03/17 11:55 AM  Result Value Ref Range Status   Specimen Description PLEURAL LEFT  Final   Special Requests NONE  Final   Culture   Final    NO GROWTH 3 DAYS Performed at [facility] [address], [place], [place] [address]    Report Status PENDING  Incomplete  Gram stain     Status: None   Collection Time: 11/03/17 11:55 AM  Result Value Ref Range Status   Specimen Description PLEURAL LEFT  Final   Special Requests NONE  Final   Gram Stain   Final    FEW WBC PRESENT, PREDOMINANTLY PMN NO ORGANISMS SEEN Performed at [facility], [place] [address], [place], [place] [address]    Report Status 11/03/2017 FINAL  Final  Aerobic/Anaerobic Culture (surgical/deep wound)     Status: None (Preliminary result)   Collection Time: 11/03/17 12:07 PM  Result Value Ref Range Status   Specimen Description TISSUE LEFT PLEURAL PEEL  Final   Special Requests SPEC C  Final   Gram Stain   Final    MODERATE WBC PRESENT,BOTH PMN AND MONONUCLEAR NO ORGANISMS SEEN    Culture   Final    NO GROWTH 3 DAYS NO ANAEROBES ISOLATED; CULTURE IN PROGRESS FOR 5 DAYS Performed at [facility], [place] [address], [place], [place] [address]    Report Status PENDING  Incomplete    Assessment/Plan: S/P Procedure(s) (LRB): FLEXIBLE BRONCHOSCOPY (Left) VIDEO  ASSISTED THORACOSCOPY (VATS) FOR DRAINAGE OF PLEURAL EFFUSION (Left)  1 left lung atx/collapse- for intubation/bronch now- cont agressive management per CCM 2 130 cc drainage drom CT today- no air leak, will leave for now with plan to leave on vent for the weekend 3 leukocytosis improved - cultures unremarkable , cont vanc/ zosyn for now 4 potassium supplement ordered 5 TF's for nutrition   John Giovanni 11/07/2017 9:03 AM

## 2017-11-07 NOTE — Progress Notes (Signed)
Pharmacy Antibiotic Note  Arlington Sigmund is a 57 y.o. male admitted on 10/31/2017 with pneumonia.  Pharmacy has been consulted for vancomycin and Zosyn dosing.   The patient is s/p VATS on 4/7 with post-op atelectasis. Extubated 4/10 however with collapsed lung and mucus plugging this AM - re-intubated and getting repeat bronch. Renal function stable - dosing remain appropriate. Abx D#8 - will follow-up on new cultures to guide antibiotic LOT.  Plan: Continue Vancomycin to 1g IV every 12 hours Continue Zosyn 3.375g IV every 8 hours (infused over 4 hours) Will continue to follow renal function, culture results, LOT, and antibiotic de-escalation plans   Height: 6\' 2"  (188 cm) Weight: 193 lb 2 oz (87.6 kg) IBW/kg (Calculated) : 82.2  Temp (24hrs), Avg:98 F (36.7 C), Min:97.7 F (36.5 C), Max:98.6 F (37 C)  Recent Labs  Lab 11/01/17 0100  11/01/17 0402 11/01/17 0646 11/02/17 0323 11/03/17 0115 11/04/17 0540 11/05/17 0548 11/07/17 0544  WBC  --    < >  --   --  18.5* 16.3* 15.2* 11.1* 9.9  CREATININE  --    < >  --   --  1.08 1.36* 1.06 1.08 1.04  LATICACIDVEN 2.3*  --  1.5 1.6  --   --   --   --   --   VANCOTROUGH  --   --   --   --   --   --  16  --   --    < > = values in this interval not displayed.    Estimated Creatinine Clearance: 88.9 mL/min (by C-G formula based on SCr of 1.04 mg/dL).    Allergies  Allergen Reactions  . Hctz [Hydrochlorothiazide] Other (See Comments)    Severe electrolyte abnormalities    Antimicrobials this admission: 4/4 vanc >> 4/4 cefepime >> 4/5 4/4 zosyn >>  Microbiology results: 4/4 BCx >> ngtd x3 days 4/4MRSA PCR neg 4/5 resp virus panel neg 4/5 RCx neg 4/5 strep ag neg; legionella neg 4/7 Pleural fluid (VATS) >> ngtd   Thank you for allowing pharmacy to be a part of this patient's care.  Alycia Rossetti, PharmD, BCPS Clinical Pharmacist Pager: [phone] Clinical phone for 11/07/2017 from 7a-3:30p: [phone] If after 3:30p,  please call main pharmacy at: [phone] 11/07/2017 9:36 AM

## 2017-11-07 NOTE — Procedures (Signed)
Bronchoscopy Procedure Note Dan Salazar [id number] Jun 10, 1961  Procedure: Bronchoscopy Indications: Remove secretions  Procedure Details Consent: Risks of procedure as well as the alternatives and risks of each were explained to the (patient/caregiver).  Consent for procedure obtained. Time Out: Verified patient identification, verified procedure, site/side was marked, verified correct patient position, special equipment/implants available, medications/allergies/relevent history reviewed, required imaging and test results available.  Performed  In preparation for procedure, patient was given 100% FiO2 and bronchoscope lubricated. Sedation: propofol gtt  Airway entered and the following bronchi were examined: Bronchi.   Procedures performed: BAL performed. Thick inspissated mucoid secretions noted in left mainstem & LL bronchus, suctioned out over 5-10 mins with saline flushes to break up mucus. Mucormyst instilled into Left airways after all mucus suctioned out Bronchoscope removed.  , Patient placed back on 100% FiO2 at conclusion of procedure.    Evaluation Hemodynamic Status: BP stable throughout; O2 sats: stable throughout Patient's Current Condition: stable Specimens:  Sent purulent fluid Complications: No apparent complications Patient did tolerate procedure well.   Dan Salazar [name] 11/07/2017

## 2017-11-07 NOTE — Progress Notes (Signed)
PULMONARY / CRITICAL CARE MEDICINE   Name: Dan Salazar MRN: [id number] DOB: 1960-11-12    ADMISSION DATE:  10/31/2017 CONSULTATION DATE:  11/01/2017  REFERRING MD:  Dr. Maudie Mercury, [facility]  CHIEF COMPLAINT:  Short of breath  HISTORY OF PRESENT ILLNESS:   57 yo male dyspnea, hypoxia, flank pain, weight loss and cough.  Found to have aspiration pneumonia and Lt pleural effusion.  PMHx of HTN, CKD 1.  SUBJECTIVE:  Still c/o left chest pain Cough mechanics very weak  VITAL SIGNS: Blood Pressure 124/76   Pulse 64   Temperature 97.7 F (36.5 C) (Axillary)   Respiration 15   Height 6\' 2"  (1.88 m)   Weight 193 lb 2 oz (87.6 kg)   Oxygen Saturation 97%   Body Mass Index 24.80 kg/m   Intake/Output Summary (Last 24 hours) at 11/07/2017 0801 Last data filed at 11/07/2017 0700 Gross per 24 hour  Intake 3073.59 ml  Output 2630 ml  Net 443.59 ml    PHYSICAL EXAMINATION: General: 57 year old white male currently resting comfortably in bed.  He does complain of left chest discomfort with deep breath and cough HEENT: Normocephalic atraumatic mucous membranes are moist there is no jugular venous distention he has a left IJ catheter in place's unremarkable. Pulmonary: Rhonchorous cough, very weak.  Very diminished on the left Cardiac: Regular rate and rhythm Abdomen: Soft nontender no organomegaly Extremities: Upper extremity edema, warm dry some scattered areas of ecchymosis Neuro: Poor recall, oriented x3, moves all extremities, generalized weakness. LABS:  BMET Recent Labs  Lab 11/04/17 0540 11/05/17 0548 11/07/17 0544  NA 135 137 141  K 4.2 3.9 3.3*  CL 104 107 107  CO2 22 23 25   BUN 16 13 11   CREATININE 1.06 1.08 1.04  GLUCOSE 102* 96 98    Electrolytes Recent Labs  Lab 11/04/17 0540 11/05/17 0548  11/05/17 1735 11/06/17 0334 11/06/17 1746 11/07/17 0544  CALCIUM 7.8* 7.7*  --   --   --   --  7.6*  MG 1.9  --    < > 1.9 1.9 1.9  --   PHOS 4.6  --    < > 3.1 3.4 3.7  --     < > = values in this interval not displayed.    CBC Recent Labs  Lab 11/04/17 0540 11/05/17 0548 11/07/17 0544  WBC 15.2* 11.1* 9.9  HGB 9.4* 9.1* 8.7*  HCT 29.7* 28.7* 27.6*  PLT 304 292 340    Coag's Recent Labs  Lab 11/01/17 0100 11/03/17 0115  APTT  --  31  INR 1.28 1.15    Sepsis Markers Recent Labs  Lab 11/01/17 0100 11/01/17 0402 11/01/17 0646  11/03/17 0115 11/05/17 1026 11/06/17 0334  LATICACIDVEN 2.3* 1.5 1.6  --   --   --   --   PROCALCITON 0.93  --   --    < > 0.39 0.10 <0.10   < > = values in this interval not displayed.    ABG Recent Labs  Lab 11/04/17 0553 11/05/17 1125 11/06/17 0355  PHART 7.409 7.448 7.439  PCO2ART 38.6 35.1 36.8  PO2ART 102 72.3* 89.8    Liver Enzymes Recent Labs  Lab 11/03/17 0115 11/05/17 0548 11/07/17 0544  AST 40 11* 14*  ALT 47 16* 15*  ALKPHOS 134* 85 124  BILITOT 0.6 0.8 0.7  ALBUMIN 2.1* 1.9* 1.9*    Cardiac Enzymes Recent Labs  Lab 10/31/17 2335 11/01/17 0646  TROPONINI 0.18* <0.03    Glucose  Recent Labs  Lab 11/06/17 0739 11/06/17 1216 11/06/17 1536 11/06/17 2000 11/06/17 2330 11/07/17 0343  GLUCAP 168* 98 82 132* 110* 95    Imaging Dg Chest Port 1 View  Result Date: 11/06/2017 CLINICAL DATA:  Status post removal of a chest tube on the left. EXAM: PORTABLE CHEST 1 VIEW COMPARISON:  Portable chest x-ray of November 06, 2017 at 4:10 a.m. FINDINGS: The patient has undergone removal of the lateral chest tube on the left. The medial chest tube remains in place with the tip in the medial interspace of the posterior fourth and fifth ribs. The lower chest tube projects over the costophrenic gutter. There is a faint pleural line in the pulmonary apex consistent with a sliver of a pneumothorax. The right lung is clear. There is no mediastinal shift. The endotracheal tube, esophagogastric tube, and left internal jugular venous catheter appears stable. IMPRESSION: Persistent tiny left apical  pneumothorax following left chest tube removal. The 2 remaining chest tubes are in stable position. Stable small left pleural effusion. Electronically Signed   By: David  Martinique M.D.   On: 11/06/2017 12:36     STUDIES:  CT angio chest 4/04 >> debris in Lt main bronchus, consolidation LLL, loculated Lt pleural effusion CT head 4/04 >> atrophy, microvascular ischemic changes Echo 4/05 >> EF 65 to 70%, PAS 38 mmHg  CULTURES: Blood 4/04 >> Respiratory viral panel 4/05 >> negative HIV 4/05 >> negative Pneumococcal Ag 4/05 >> negative Legionella Ag 4/05 >> negative  ANTIBIOTICS: Vancomycin 4/04 >> Zosyn 4/04 >>   Procedures OETT 4/9>>> Bronch 4/9 Left ij CVL 4/9>>>   SIGNIFICANT EVENTS: 4/04 Admit 4/06 Off pressors, transferred to [facility], [place] consulted  RESOLVED ISSUES RI Septic shock   ASSESSMENT / PLAN:  Acute hypoxic respiratory failure in the setting of aspiration pneumonia Complicated by loculated Lt pleural effusion/Empyema status post left video-assisted thoracotomy on 4/7 now with postoperative atelectasis Portable chest x-ray personally reviewed: Endotracheal tube and chest tube are in satisfactory position.  Unfortunately he is completely collapsed his left side again with marked mucus plugging once again identified Cough mechanics extremely weak and ineffective  Plan Reintubate, ventilate PEEP therapy Bronc again today Continue Mucomyst PAD protocol RASS goal -2 Day #8 of 10 Vanco and Zosyn we will reculture sputum during bronchoscopy this time Chest tube management per thoracic surgery Repeat chest x-ray and ABG after  bronchoscopy  Fluid and electrolyte imbalance: Hypokalemia Plan Replace and recheck  Dysphagia. - seen by speech therapy 4/05 >> regular diet Plan N.p.o. Resume tube feeds following intubation  CKD 2.  serum creatinine had increased, now improved.  Looks like baseline somewhere between 1.2 and 1.3 Plan Renal dose medications Strict  I&O  Dementia ? Cognitive decline. Vs element of delirium Plan PAD protocol Continue thiamine and folate  Anemia of critical illness. -Hemoglobin has remained stable. Plan Continue subcu heparin Trend CBC  DVT prophylaxis -PAS hose, start subcutaneous heparin SUP - Protonix Nutrition - NPO for surgery Goals of care - full code    Erick Colace ACNP-BC [place] Pager # [phone] OR # [phone] if no answer

## 2017-11-07 NOTE — Progress Notes (Signed)
Nutrition Follow-up  DOCUMENTATION CODES:   Not applicable  INTERVENTION:   Tube Feeding:  Vital High Protein @ 65 ml/hr Providing 1560 kcals, 137 g of protein and 1310 mL of free water Meets 100% estimated protein needs  TF regimen and propofol at current rate providing 1837  total kcal/day (97 % of kcal needs)    NUTRITION DIAGNOSIS:   Inadequate oral intake related to acute illness as evidenced by NPO status.  Being addressed via TF  GOAL:   Provide needs based on ASPEN/SCCM guidelines  Met  MONITOR:   Vent status, TF tolerance, Labs, Weight trends  REASON FOR ASSESSMENT:   Consult Assessment of nutrition requirement/status  ASSESSMENT:    57 yo male admitted with moderate to large left-sided pleural effusion with adjacent left lung collapse, aspiration penumonia with septic shock. Pt with hx of HTN, hernia repair  4/7 VATS for drainage of empyema and decortication 4/8 Elective intubation with bronch 4/10 Extubated 4/11 Re-intubated  Consult received to resume TF  Patient is currently intubated on ventilator support MV:  7.7 L/min Temp (24hrs), Avg:98.1 F (36.7 C), Min:97.7 F (36.5 C), Max:98.6 F (37 C)  Propofol: 10.5 ml/hr  Weight has trended up since admission; weight of 82.4 kg used as EDW for estimating needs  Labs: reviewed Meds: NS at 100 ml/hr  Diet Order:  Diet NPO time specified  EDUCATION NEEDS:   Not appropriate for education at this time  Skin:  Skin Assessment: Reviewed RN Assessment  Last BM:  4/10  Height:   Ht Readings from Last 1 Encounters:  11/02/17 _0  (1.88 m)    Weight:   Wt Readings from Last 1 Encounters:  11/07/17 193 lb 2 oz (87.6 kg)    Ideal Body Weight:     BMI:  Body mass index is 24.8 kg/m.  Estimated Nutritional Needs:   Kcal:  1900 kcals  Protein:  123-148 g  Fluid:  >/= 1.9 L   Kerman Passey MS, RD, LDN, CNSC [phone] Pager  [phone] Weekend/On-Call Pager

## 2017-11-07 NOTE — Progress Notes (Signed)
Pt intubated by Laurey Arrow, PA without difficulty via Glidescope with Mac #4 and ETT #8 placed at 24cm. Breath sounds equal and bilat. ETCO2 with positive exchange. Pt placed on vent with current settings. ETT secure and intact. No distress noted. CXR confirmed at 1040. ETT in good placement.  Pt bedside bronched per/by MD. Pt tol well. Large amount of thick clear secretions. Sputum sample obtained and sent to lab. Pt tol well. Pt lavaged and mucomyst given. No complications.

## 2017-11-08 DIAGNOSIS — J869 Pyothorax without fistula: Secondary | ICD-10-CM

## 2017-11-08 LAB — COMPREHENSIVE METABOLIC PANEL
ALK PHOS: 86 U/L (ref 38–126)
ALT: 12 U/L — AB (ref 17–63)
ANION GAP: 8 (ref 5–15)
AST: 9 U/L — ABNORMAL LOW (ref 15–41)
Albumin: 1.8 g/dL — ABNORMAL LOW (ref 3.5–5.0)
BILIRUBIN TOTAL: 0.6 mg/dL (ref 0.3–1.2)
BUN: 12 mg/dL (ref 6–20)
CALCIUM: 7.7 mg/dL — AB (ref 8.9–10.3)
CO2: 24 mmol/L (ref 22–32)
CREATININE: 0.91 mg/dL (ref 0.61–1.24)
Chloride: 110 mmol/L (ref 101–111)
GFR calc Af Amer: >60 mL/min (ref >60)
GFR calc non Af Amer: >60 mL/min (ref >60)
GLUCOSE: 108 mg/dL — AB (ref 65–99)
Potassium: 3.3 mmol/L — ABNORMAL LOW (ref 3.5–5.1)
Sodium: 142 mmol/L (ref 135–145)
TOTAL PROTEIN: 4.6 g/dL — AB (ref 6.5–8.1)

## 2017-11-08 LAB — CBC
HEMATOCRIT: 23.8 % — AB (ref 39.0–52.0)
HEMOGLOBIN: 7.6 g/dL — AB (ref 13.0–17.0)
MCH: 30.8 pg (ref 26.0–34.0)
MCHC: 31.9 g/dL (ref 30.0–36.0)
MCV: 96.4 fL (ref 78.0–100.0)
Platelets: 272 10*3/uL (ref 150–400)
RBC: 2.47 MIL/uL — ABNORMAL LOW (ref 4.22–5.81)
RDW: 15.2 % (ref 11.5–15.5)
WBC: 8 10*3/uL (ref 4.0–10.5)

## 2017-11-08 LAB — AEROBIC/ANAEROBIC CULTURE W GRAM STAIN (SURGICAL/DEEP WOUND): Culture: NO GROWTH

## 2017-11-08 LAB — GLUCOSE, CAPILLARY
GLUCOSE-CAPILLARY: 104 mg/dL — AB (ref 65–99)
GLUCOSE-CAPILLARY: 111 mg/dL — AB (ref 65–99)
GLUCOSE-CAPILLARY: 90 mg/dL (ref 65–99)
Glucose-Capillary: 95 mg/dL (ref 65–99)
Glucose-Capillary: 104 mg/dL — ABNORMAL HIGH (ref 65–99)
Glucose-Capillary: 122 mg/dL — ABNORMAL HIGH (ref 65–99)

## 2017-11-08 LAB — TRIGLYCERIDES: TRIGLYCERIDES: 125 mg/dL (ref <150)

## 2017-11-08 LAB — CULTURE, BODY FLUID-BOTTLE

## 2017-11-08 LAB — MAGNESIUM
Magnesium: 1.8 mg/dL (ref 1.7–2.4)
Magnesium: 2.7 mg/dL — ABNORMAL HIGH (ref 1.7–2.4)

## 2017-11-08 LAB — CULTURE, BODY FLUID W GRAM STAIN -BOTTLE: Culture: NO GROWTH

## 2017-11-08 LAB — PHOSPHORUS
Phosphorus: 3.2 mg/dL (ref 2.5–4.6)
Phosphorus: 3.4 mg/dL (ref 2.5–4.6)

## 2017-11-08 LAB — AEROBIC/ANAEROBIC CULTURE (SURGICAL/DEEP WOUND)

## 2017-11-08 MED ORDER — MAGNESIUM SULFATE 4 GM/100ML IV SOLN
4.0000 g | Freq: Once | INTRAVENOUS | Status: AC
  Administered 2017-11-08: 4 g via INTRAVENOUS
  Filled 2017-11-08: qty 100

## 2017-11-08 MED ORDER — ORAL CARE MOUTH RINSE
15.0000 mL | OROMUCOSAL | Status: DC
  Administered 2017-11-08 – 2017-11-10 (×18): 15 mL via OROMUCOSAL

## 2017-11-08 MED ORDER — POTASSIUM CHLORIDE 20 MEQ/15ML (10%) PO SOLN
40.0000 meq | Freq: Two times a day (BID) | ORAL | Status: AC
  Administered 2017-11-08 (×2): 40 meq
  Filled 2017-11-08 (×2): qty 30

## 2017-11-08 MED ORDER — CHLORHEXIDINE GLUCONATE 0.12% ORAL RINSE (MEDLINE KIT): 15.0000 mL | Freq: Two times a day (BID) | OROMUCOSAL | Status: DC

## 2017-11-08 NOTE — Progress Notes (Signed)
[facility] Progress Note Patient Name: Dan Salazar DOB: Jun 29, 1961 MRN: [id number]   Date of Service  11/08/2017  HPI/Events of Note  diarrhea  eICU Interventions  flexiseal     Intervention Category Minor Interventions: Other:  Dan Salazar 11/08/2017, 3:05 AM

## 2017-11-08 NOTE — Progress Notes (Signed)
PULMONARY / CRITICAL CARE MEDICINE   Name: Dan Salazar MRN: [id number] DOB: Mar 11, 1961    ADMISSION DATE:  10/31/2017 CONSULTATION DATE:  11/01/2017  REFERRING MD:  Dr. Maudie Mercury, [facility]  CHIEF COMPLAINT:  Short of breath  HISTORY OF PRESENT ILLNESS:   57 yo male dyspnea, hypoxia, flank pain, weight loss and cough.  Found to have aspiration pneumonia and Lt pleural effusion.  PMHx of HTN, CKD 1.  SUBJECTIVE:  Sedated on vent   VITAL SIGNS: Blood Pressure (Abnormal) 151/83   Pulse 61   Temperature 98.2 F (36.8 C) (Oral)   Respiration (Abnormal) 22   Height 6\' 2"  (1.88 m)   Weight 195 lb 1.7 oz (88.5 kg)   Oxygen Saturation 100%   Body Mass Index 25.05 kg/m   Intake/Output Summary (Last 24 hours) at 11/08/2017 0925 Last data filed at 11/08/2017 0800 Gross per 24 hour  Intake 3888.08 ml  Output 2170 ml  Net 1718.08 ml    PHYSICAL EXAMINATION: General: 57 year old white male currently sedated on the ventilator HEENT: Normocephalic atraumatic, poor dentition, orally intubated, no jugular venous distention Pulmonary: Rhonchorous breath sounds left greater than right no accessory use full ventilator support Cardiac: Regular rate and rhythm Abdomen: Soft, nontender, positive bowel sounds, no organomegaly Extremities/musculoskeletal: Warm, dry, brisk cap refill, dependent edema, scattered areas of ecchymosis Neuro/psych: Awake with verbal stimulus, follows commands, no focal motor deficits LABS:  BMET Recent Labs  Lab 11/05/17 0548 11/07/17 0544 11/08/17 0505  NA 137 141 142  K 3.9 3.3* 3.3*  CL 107 107 110  CO2 23 25 24   BUN 13 11 12   CREATININE 1.08 1.04 0.91  GLUCOSE 96 98 108*    Electrolytes Recent Labs  Lab 11/05/17 0548  11/07/17 0544 11/07/17 1809 11/08/17 0505  CALCIUM 7.7*  --  7.6*  --  7.7*  MG  --    < > 1.9 1.8 1.8  PHOS  --    < > 3.9 3.1 3.2   < > = values in this interval not displayed.    CBC Recent Labs  Lab 11/05/17 0548 11/07/17 0544  11/08/17 0505  WBC 11.1* 9.9 8.0  HGB 9.1* 8.7* 7.6*  HCT 28.7* 27.6* 23.8*  PLT 292 340 272    Coag's Recent Labs  Lab 11/03/17 0115  APTT 31  INR 1.15    Sepsis Markers Recent Labs  Lab 11/05/17 1026 11/06/17 0334 11/07/17 0544  PROCALCITON 0.10 <0.10 <0.10    ABG Recent Labs  Lab 11/05/17 1125 11/06/17 0355 11/07/17 1200  PHART 7.448 7.439 7.488*  PCO2ART 35.1 36.8 31.6*  PO2ART 72.3* 89.8 327*    Liver Enzymes Recent Labs  Lab 11/05/17 0548 11/07/17 0544 11/08/17 0505  AST 11* 14* 9*  ALT 16* 15* 12*  ALKPHOS 85 124 86  BILITOT 0.8 0.7 0.6  ALBUMIN 1.9* 1.9* 1.8*    Cardiac Enzymes No results for input(s): TROPONINI, PROBNP in the last 168 hours.  Glucose Recent Labs  Lab 11/07/17 1155 11/07/17 1602 11/07/17 1950 11/08/17 0044 11/08/17 0446 11/08/17 0740  GLUCAP 109* 100* 111* 90 111* 95    Imaging Dg Chest Port 1 View  Result Date: 11/07/2017 CLINICAL DATA:  ETT AND OG PLACEMENT EXAM: PORTABLE CHEST 1 VIEW COMPARISON:  11/07/2017 FINDINGS: Endotracheal tube 7.5 cm from carina. Placement NG tube extends the stomach. Central venous line, 2 LEFT chest tubes unchanged. Marked reduction in volume of LEFT pleural effusion. Loculated fluid remains. RIGHT lung relatively clear. IMPRESSION: 1. Endotracheal tube  in good position.  NGT placed appropriately 2. Marked reduction in LEFT pleural effusion. Electronically Signed   By: Suzy Bouchard M.D.   On: 11/07/2017 10:37     STUDIES:  CT angio chest 4/04 >> debris in Lt main bronchus, consolidation LLL, loculated Lt pleural effusion CT head 4/04 >> atrophy, microvascular ischemic changes Echo 4/05 >> EF 65 to 70%, PAS 38 mmHg  CULTURES: Blood 4/04 >> Respiratory viral panel 4/05 >> negative HIV 4/05 >> negative Pneumococcal Ag 4/05 >> negative Legionella Ag 4/05 >> negative  ANTIBIOTICS: Vancomycin 4/04 >> Zosyn 4/04 >>   Procedures OETT 4/9>>> Bronch 4/9 Left ij CVL  4/9>>>   SIGNIFICANT EVENTS: 4/04 Admit 4/06 Off pressors, transferred to [facility], [place] consulted  RESOLVED ISSUES RI Septic shock   ASSESSMENT / PLAN:  Acute hypoxic respiratory failure in the setting of aspiration pneumonia Complicated by loculated Lt pleural effusion/Empyema status post left video-assisted thoracotomy on 4/7 now with postoperative atelectasis Repeat bronchoscopy completed on 4/11: This demonstrated improved aeration with left basilar atelectasis chest tube, central line, endotracheal tube in satisfactory position  Plan Continue full ventilator support Continue PEEP at 8 Continue Mucomyst PAD protocol RASS: -2 Day #9 of 10 vancomycin; may extend Zosyn further Chest tube management per thoracic  A.m. chest x-ray  Fluid and electrolyte imbalance: Hypokalemia Plan Replace and recheck Dysphagia. - seen by speech therapy 4/05 >> regular diet Plan Continue tube feeds  CKD 2.  serum creatinine had increased, now improved.  Looks like baseline somewhere between 1.2 and 1.3 Plan Renal dose medications Strict intake output A.m. chemistry  Dementia ? Cognitive decline. Vs element of delirium Plan PAD protocol Continue thiamine and folate  Anemia of critical illness. -Hemoglobin trending down, no evidence of bleeding, suspect element of hemodilution Plan Subcutaneous heparin Trend CBC   DVT prophylaxis -PAS hose, start subcutaneous heparin SUP - Protonix Nutrition - NPO for surgery Goals of care - full code    Erick Colace ACNP-BC [place] Pager # [phone] OR # [phone] if no answer

## 2017-11-08 NOTE — Progress Notes (Deleted)
Intubation Procedure Note Dan Salazar [id number] 02-20-1961  Procedure: Intubation Indications: Respiratory insufficiency  Procedure Details Consent: Risks of procedure as well as the alternatives and risks of each were explained to the (patient/caregiver).  Consent for procedure obtained. Time Out: Verified patient identification, verified procedure, site/side was marked, verified correct patient position, special equipment/implants available, medications/allergies/relevent history reviewed, required imaging and test results available.  Performed  Maximum sterile technique was used including antiseptics, cap, gloves, hand hygiene and mask.  MAC and 4    Evaluation Hemodynamic Status: BP stable throughout; O2 sats: stable throughout Patient's Current Condition: stable Complications: No apparent complications Patient did tolerate procedure well. Chest X-ray ordered to verify placement.  CXR: tube position acceptable.   Dan Salazar 11/08/2017  Dan Salazar ACNP-BC [place] Pager # [phone] OR # [phone] if no answer

## 2017-11-08 NOTE — Progress Notes (Signed)
Pt could not tolerate flexi-seal per order.  Flexi seal is to remain at bedside during 2M LOS.  Flexi-pouch placed, tolerating well.

## 2017-11-08 NOTE — Progress Notes (Signed)
[facility] DAILY ICU PROGRESS NOTE                   [address]            [place] [address]          [phone]   5 Days Post-Op Procedure(s) (LRB): FLEXIBLE BRONCHOSCOPY (Left) VIDEO ASSISTED THORACOSCOPY (VATS) FOR DRAINAGE OF PLEURAL EFFUSION (Left)  Total Length of Stay:  LOS: 8 days   Subjective: Intubated but fairly alert  Objective: Vital signs in last 24 hours: Temp:  [98.1 F (36.7 C)-99.8 F (37.7 C)] 99.8 F (37.7 C) (04/12 1137) Pulse Rate:  [59-89] 71 (04/12 1148) Cardiac Rhythm: Normal sinus rhythm (04/12 0800) Resp:  [13-28] 17 (04/12 1148) BP: (83-171)/(63-96) 124/81 (04/12 1100) SpO2:  [95 %-100 %] 100 % (04/12 1148) FiO2 (%):  [40 %-50 %] 40 % (04/12 1148) Weight:  [195 lb 1.7 oz (88.5 kg)] 195 lb 1.7 oz (88.5 kg) (04/12 0500)  Filed Weights   11/06/17 0500 11/07/17 0455 11/08/17 0500  Weight: 189 lb 6 oz (85.9 kg) 193 lb 2 oz (87.6 kg) 195 lb 1.7 oz (88.5 kg)    Weight change: 1 lb 15.7 oz (0.9 kg)   Hemodynamic parameters for last 24 hours:    Intake/Output from previous day: 04/11 0701 - 04/12 0700 In: 4458.9 [I.V.:2661.8; NG/GT:1207.1; IV Piggyback:550] Out: 1700 [Urine:1690; Chest Tube:10]  Intake/Output this shift: Total I/O In: 247.6 [I.V.:52.6; NG/GT:195] Out: 810 [Urine:770; Chest Tube:40]  Current Meds: Scheduled Meds: . chlorhexidine  15 mL Mouth Rinse BID  . chlorhexidine gluconate (MEDLINE KIT)  15 mL Mouth Rinse BID  . docusate  100 mg Per Tube QHS  . heparin injection (subcutaneous)  5,000 Units Subcutaneous Q8H  . ipratropium-albuterol  3 mL Nebulization TID  . mouth rinse  15 mL Mouth Rinse QID  . potassium chloride  40 mEq Per Tube BID  . thiamine injection  100 mg Intravenous Daily   Continuous Infusions: . feeding supplement (VITAL HIGH PROTEIN) 1,000 mL (11/08/17 0646)  . magnesium sulfate 1 - 4 g bolus IVPB 4 g (11/08/17 1105)  . piperacillin-tazobactam (ZOSYN)  IV 3.375 g (11/08/17 1006)  .  potassium chloride    . propofol (DIPRIVAN) infusion 35 mcg/kg/min (11/08/17 0800)  . vancomycin Stopped (11/08/17 0644)   PRN Meds:.bisacodyl, fentaNYL (SUBLIMAZE) injection, fentaNYL (SUBLIMAZE) injection, ondansetron (ZOFRAN) IV, potassium chloride, sodium chloride flush, traMADol  General appearance: alert and no distress Heart: regular rate and rhythm Lungs: dim in bases Abdomen: soft, non-tender Extremities: mild edema Wound: incis ok  Lab Results: CBC: Recent Labs    11/07/17 0544 11/08/17 0505  WBC 9.9 8.0  HGB 8.7* 7.6*  HCT 27.6* 23.8*  PLT 340 272   BMET:  Recent Labs    11/07/17 0544 11/08/17 0505  NA 141 142  K 3.3* 3.3*  CL 107 110  CO2 25 24  GLUCOSE 98 108*  BUN 11 12  CREATININE 1.04 0.91  CALCIUM 7.6* 7.7*    CMET: Lab Results  Component Value Date   WBC 8.0 11/08/2017   HGB 7.6 (L) 11/08/2017   HCT 23.8 (L) 11/08/2017   PLT 272 11/08/2017   GLUCOSE 108 (H) 11/08/2017   CHOL 101 11/01/2017   TRIG 125 11/08/2017   HDL 42 11/01/2017   LDLCALC 49 11/01/2017   ALT 12 (L) 11/08/2017   AST 9 (L) 11/08/2017   NA 142 11/08/2017   K 3.3 (L) 11/08/2017  CL 110 11/08/2017   CREATININE 0.91 11/08/2017   BUN 12 11/08/2017   CO2 24 11/08/2017   TSH 0.817 11/04/2017   INR 1.15 11/03/2017   HGBA1C 6.1 (H) 11/01/2017      PT/INR: No results for input(s): LABPROT, INR in the last 72 hours. Radiology: No results found.   Recent Results (from the past 240 hour(s))  MRSA PCR Screening     Status: None   Collection Time: 10/31/17 11:03 PM  Result Value Ref Range Status   MRSA by PCR NEGATIVE NEGATIVE Final    Comment:        The GeneXpert MRSA Assay (FDA approved for NASAL specimens only), is one component of a comprehensive MRSA colonization surveillance program. It is not intended to diagnose MRSA infection nor to guide or monitor treatment for MRSA infections. Performed at [facility], [place] [address]  [place], [place] [address]   Culture, blood (routine x 2) Call MD if unable to obtain prior to antibiotics being given     Status: None   Collection Time: 10/31/17 11:35 PM  Result Value Ref Range Status   Specimen Description   Final    BLOOD LEFT ANTECUBITAL Performed at [facility] [address], [place], [place] [address]    Special Requests   Final    BOTTLES DRAWN AEROBIC AND ANAEROBIC Blood Culture adequate volume Performed at [facility] [address] [place], [place] [address]    Culture   Final    NO GROWTH 5 DAYS Performed at [facility], [place] [address] [place], [place] [address]    Report Status 11/06/2017 FINAL  Final  Culture, blood (routine x 2) Call MD if unable to obtain prior to antibiotics being given     Status: None   Collection Time: 10/31/17 11:35 PM  Result Value Ref Range Status   Specimen Description   Final    BLOOD LEFT HAND Performed at [facility] [address], [place], [place] [address]    Special Requests   Final    BOTTLES DRAWN AEROBIC AND ANAEROBIC Blood Culture adequate volume Performed at [facility] [address] [place], [place] [address]    Culture   Final    NO GROWTH 5 DAYS Performed at [facility], [place] [address], [place], [place] [address]    Report Status 11/06/2017 FINAL  Final  Respiratory Panel by PCR     Status: None   Collection Time: 11/01/17  1:30 AM  Result Value Ref Range Status   Adenovirus NOT DETECTED NOT DETECTED Final   Coronavirus 229E NOT DETECTED NOT DETECTED Final   Coronavirus HKU1 NOT DETECTED NOT DETECTED Final   Coronavirus NL63 NOT DETECTED NOT DETECTED Final   Coronavirus OC43 NOT DETECTED NOT DETECTED Final   Metapneumovirus NOT DETECTED NOT DETECTED Final   Rhinovirus / Enterovirus NOT DETECTED NOT DETECTED Final   Influenza A NOT DETECTED NOT DETECTED Final   Influenza B NOT DETECTED NOT DETECTED Final     Parainfluenza Virus 1 NOT DETECTED NOT DETECTED Final   Parainfluenza Virus 2 NOT DETECTED NOT DETECTED Final   Parainfluenza Virus 3 NOT DETECTED NOT DETECTED Final   Parainfluenza Virus 4 NOT DETECTED NOT DETECTED Final   Respiratory Syncytial Virus NOT DETECTED NOT DETECTED Final   Bordetella pertussis NOT DETECTED NOT DETECTED Final   Chlamydophila pneumoniae NOT DETECTED NOT DETECTED Final   Mycoplasma pneumoniae NOT  DETECTED NOT DETECTED Final    Comment: Performed at [facility], [place] [address] [place], [place] [address]  Culture, expectorated sputum-assessment     Status: None   Collection Time: 11/01/17 10:34 AM  Result Value Ref Range Status   Specimen Description SPUTUM  Final   Special Requests NONE  Final   Sputum evaluation   Final    THIS SPECIMEN IS ACCEPTABLE FOR SPUTUM CULTURE Performed at [facility], [place] [address], [place], [place] [address]    Report Status 11/01/2017 FINAL  Final  Culture, respiratory (NON-Expectorated)     Status: None   Collection Time: 11/01/17 10:34 AM  Result Value Ref Range Status   Specimen Description   Final    SPUTUM Performed at [facility] [address], [place], [place] [address]    Special Requests   Final    NONE Reflexed from [id number] Performed at [facility], [place] [address], [place], [place] [address]    Gram Stain   Final    ABUNDANT WBC PRESENT, PREDOMINANTLY PMN NO ORGANISMS SEEN    Culture   Final    Consistent with normal respiratory flora. Performed at [facility], [place] [address], [place], [place] [address]    Report Status 11/04/2017 FINAL  Final  Surgical PCR screen     Status: None   Collection Time: 11/03/17  9:13 AM  Result Value Ref Range Status   MRSA, PCR NEGATIVE NEGATIVE Final   Staphylococcus aureus NEGATIVE NEGATIVE Final    Comment: (NOTE) The Xpert SA Assay (FDA approved for NASAL specimens in patients 31 years of age  and older), is one component of a comprehensive surveillance program. It is not intended to diagnose infection nor to guide or monitor treatment. Performed at [facility], [place] [address], [place], [place] [address]   Culture, respiratory (NON-Expectorated)     Status: None   Collection Time: 11/03/17 11:16 AM  Result Value Ref Range Status   Specimen Description SPUTUM BRONCHIAL SPUTUM NOT WASHING  Final   Special Requests SPEC A  Final   Gram Stain   Final    ABUNDANT WBC PRESENT, PREDOMINANTLY PMN NO SQUAMOUS EPITHELIAL CELLS SEEN NO ORGANISMS SEEN    Culture   Final    NO GROWTH 2 DAYS Performed at [facility], [place] [address] [place] [address], [place], [place] [address]    Report Status 11/05/2017 FINAL  Final  Culture, body fluid-bottle     Status: None   Collection Time: 11/03/17 11:55 AM  Result Value Ref Range Status   Specimen Description PLEURAL LEFT  Final   Special Requests NONE  Final   Culture   Final    NO GROWTH 5 DAYS Performed at [facility] [address], [place], [place] [address]    Report Status 11/08/2017 FINAL  Final  Gram stain     Status: None   Collection Time: 11/03/17 11:55 AM  Result Value Ref Range Status   Specimen Description PLEURAL LEFT  Final   Special Requests NONE  Final   Gram Stain   Final    FEW WBC PRESENT, PREDOMINANTLY PMN NO ORGANISMS SEEN Performed at [facility], [place] [address] [place], [place] [address]    Report Status 11/03/2017 FINAL  Final  Aerobic/Anaerobic Culture (surgical/deep wound)     Status: None (Preliminary result)   Collection Time: 11/03/17 12:07 PM  Result Value Ref Range  Status   Specimen Description TISSUE LEFT PLEURAL PEEL  Final   Special Requests SPEC C  Final   Gram Stain   Final    MODERATE WBC PRESENT,BOTH PMN AND MONONUCLEAR NO ORGANISMS SEEN    Culture   Final    NO GROWTH 4 DAYS NO ANAEROBES ISOLATED; CULTURE IN PROGRESS FOR 5 DAYS Performed at [facility], [address] [place], [place] [address]    Report Status PENDING  Incomplete  Culture, respiratory (NON-Expectorated)     Status: None (Preliminary result)   Collection Time: 11/07/17 10:12 AM  Result Value Ref Range Status   Specimen Description TRACHEAL ASPIRATE  Final   Special Requests NONE  Final   Gram Stain   Final    RARE WBC PRESENT, PREDOMINANTLY PMN NO ORGANISMS SEEN    Culture   Final    CULTURE REINCUBATED FOR BETTER GROWTH Performed at [facility], [place] [address] [place], [place] [address]    Report Status PENDING  Incomplete    Assessment/Plan: S/P Procedure(s) (LRB): FLEXIBLE BRONCHOSCOPY (Left) VIDEO ASSISTED THORACOSCOPY (VATS) FOR DRAINAGE OF PLEURAL EFFUSION (Left)   1 stable, 140 cc from tubes yesterday, poss remove soon 2 cont vent and primary management per [facility] E Clarke Peretz 11/08/2017 12:15 PM

## 2017-11-09 ENCOUNTER — Inpatient Hospital Stay (HOSPITAL_COMMUNITY): Payer: Self-pay

## 2017-11-09 LAB — COMPREHENSIVE METABOLIC PANEL WITH GFR
ALT: 12 U/L — ABNORMAL LOW (ref 17–63)
AST: 10 U/L — ABNORMAL LOW (ref 15–41)
Albumin: 1.9 g/dL — ABNORMAL LOW (ref 3.5–5.0)
Alkaline Phosphatase: 116 U/L (ref 38–126)
Anion gap: 6 (ref 5–15)
BUN: 10 mg/dL (ref 6–20)
CO2: 26 mmol/L (ref 22–32)
Calcium: 7.9 mg/dL — ABNORMAL LOW (ref 8.9–10.3)
Chloride: 109 mmol/L (ref 101–111)
Creatinine, Ser: 0.86 mg/dL (ref 0.61–1.24)
GFR calc Af Amer: >60 mL/min
GFR calc non Af Amer: >60 mL/min
Glucose, Bld: 101 mg/dL — ABNORMAL HIGH (ref 65–99)
Potassium: 3.5 mmol/L (ref 3.5–5.1)
Sodium: 141 mmol/L (ref 135–145)
Total Bilirubin: 0.6 mg/dL (ref 0.3–1.2)
Total Protein: 5 g/dL — ABNORMAL LOW (ref 6.5–8.1)

## 2017-11-09 LAB — BLOOD GAS, ARTERIAL
Acid-Base Excess: 3.9 mmol/L — ABNORMAL HIGH (ref 0.0–2.0)
Bicarbonate: 27.4 mmol/L (ref 20.0–28.0)
Drawn by: 28340
FIO2: 40
MECHVT: 650 mL
O2 Saturation: 97.4 %
PEEP: 8 cmH2O
Patient temperature: 98.6
RATE: 12 {breaths}/min
pCO2 arterial: 37.7 mmHg (ref 32.0–48.0)
pH, Arterial: 7.474 — ABNORMAL HIGH (ref 7.350–7.450)
pO2, Arterial: 88.9 mmHg (ref 83.0–108.0)

## 2017-11-09 LAB — CBC
HEMATOCRIT: 24.4 % — AB (ref 39.0–52.0)
Hemoglobin: 7.7 g/dL — ABNORMAL LOW (ref 13.0–17.0)
MCH: 30 pg (ref 26.0–34.0)
MCHC: 31.6 g/dL (ref 30.0–36.0)
MCV: 94.9 fL (ref 78.0–100.0)
PLATELETS: 308 10*3/uL (ref 150–400)
RBC: 2.57 MIL/uL — ABNORMAL LOW (ref 4.22–5.81)
RDW: 15.2 % (ref 11.5–15.5)
WBC: 7.9 10*3/uL (ref 4.0–10.5)

## 2017-11-09 LAB — GLUCOSE, CAPILLARY
GLUCOSE-CAPILLARY: 100 mg/dL — AB (ref 65–99)
GLUCOSE-CAPILLARY: 107 mg/dL — AB (ref 65–99)
Glucose-Capillary: 118 mg/dL — ABNORMAL HIGH (ref 65–99)
Glucose-Capillary: 104 mg/dL — ABNORMAL HIGH (ref 65–99)
Glucose-Capillary: 118 mg/dL — ABNORMAL HIGH (ref 65–99)
Glucose-Capillary: 98 mg/dL (ref 65–99)
Glucose-Capillary: 106 mg/dL — ABNORMAL HIGH (ref 65–99)

## 2017-11-09 IMAGING — DX DG CHEST 1V PORT
1 series · 1 of 1 positions shown · non-contrast
Comparison: [DATE]

CLINICAL DATA: Pneumonia

EXAM:
PORTABLE CHEST 1 VIEW

[chest ap]
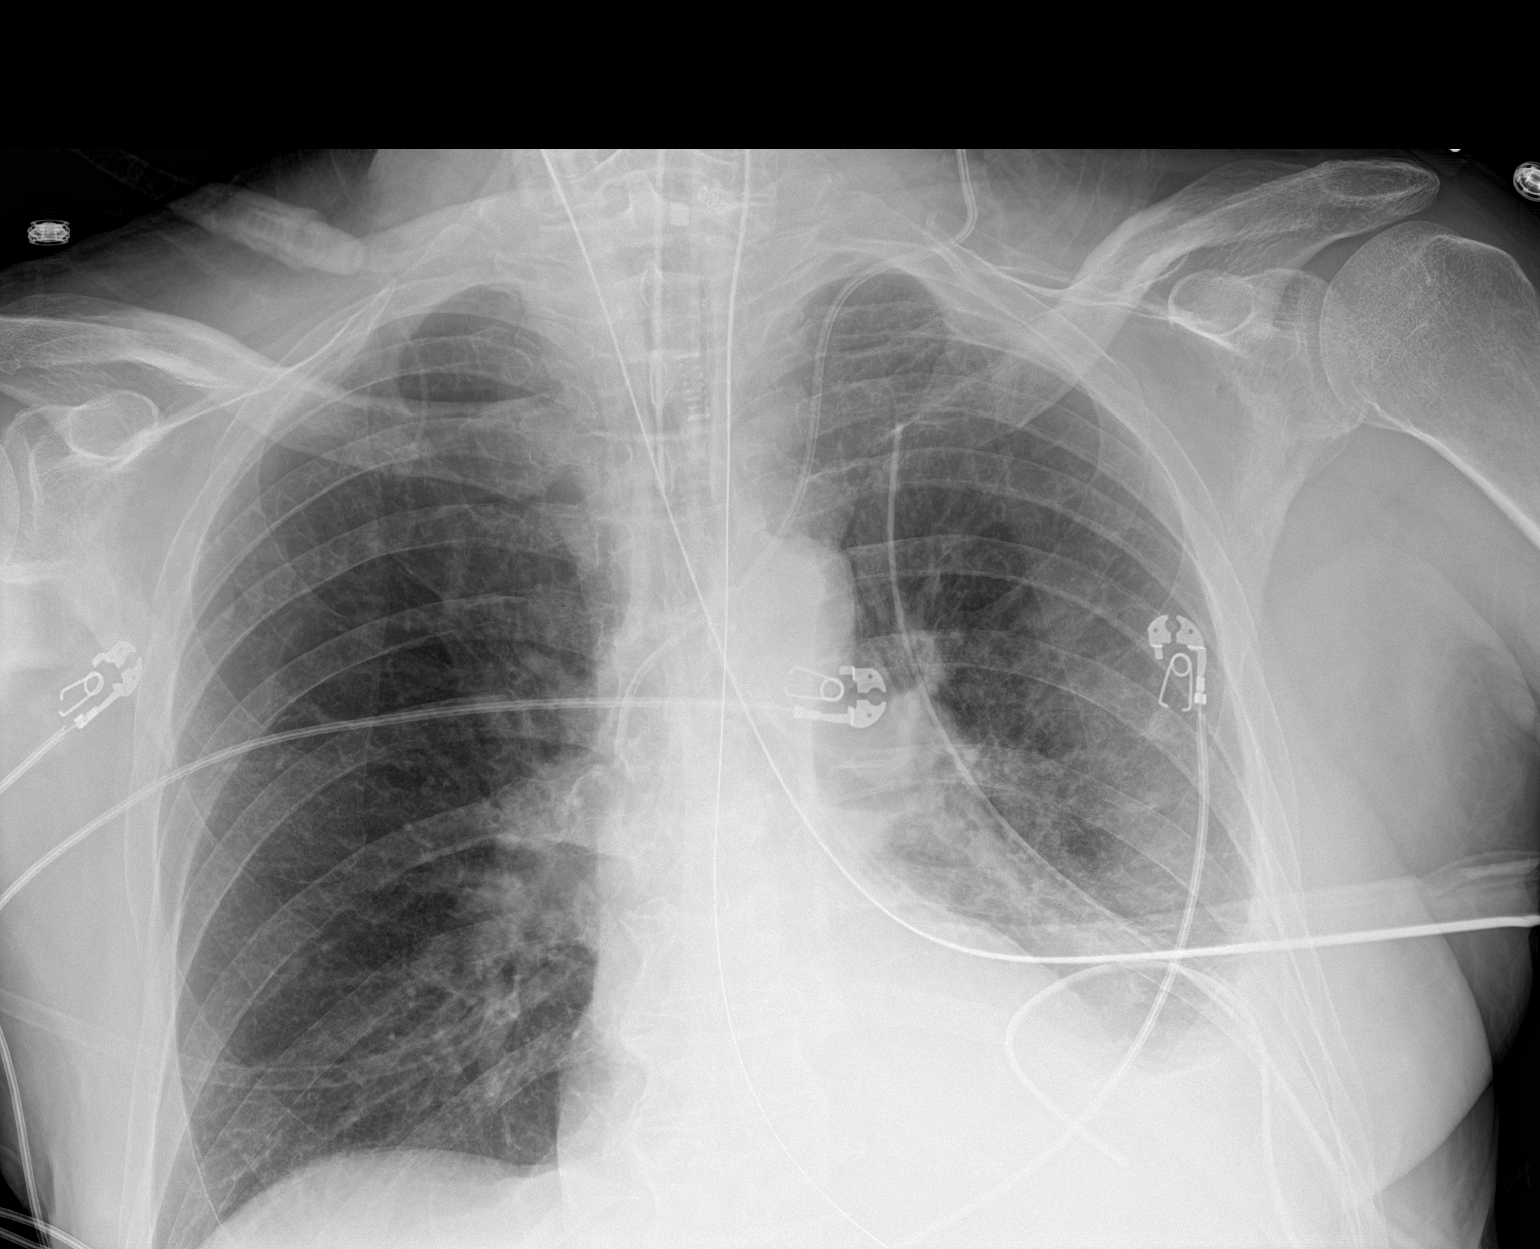

[1 of 1 positions shown; findings below may reference images not displayed]

FINDINGS: The ETT and left central line are in good position. The NG tube
terminates below today's film. No pneumothorax. No nodules or
masses. Persistent left effusion with underlying opacity. Persistent
left chest tubes.
IMPRESSION: 1. Stable support apparatus.
2. Persistent left-sided pleural effusion and underlying opacity.

## 2017-11-09 IMAGING — DX DG CHEST 1V PORT
1 series · 1 of 1 positions shown · non-contrast
Comparison: Portable exam [BJ] hours compared to [BJ] hours

CLINICAL DATA: Chest tube

EXAM:
PORTABLE CHEST 1 VIEW

[chest ap]
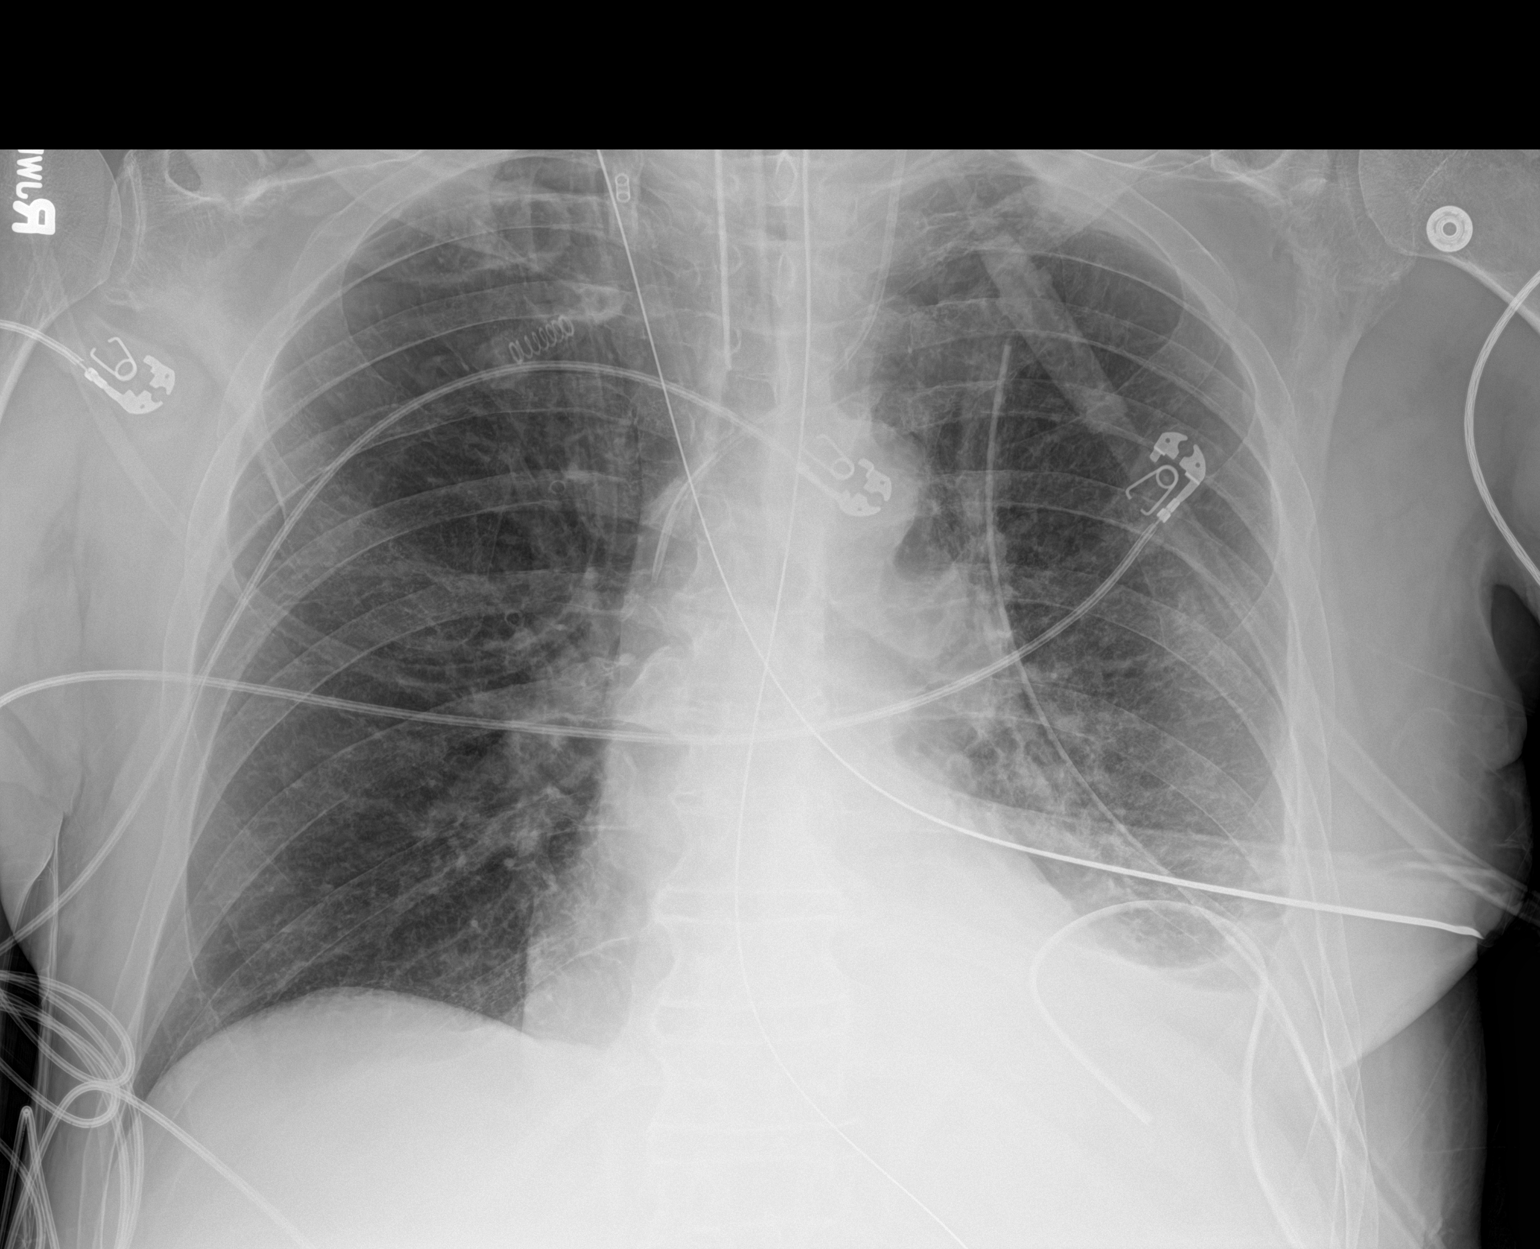

[1 of 1 positions shown; findings below may reference images not displayed]

FINDINGS: Tip of endotracheal tube projects 6.0 cm above carina.

Nasogastric tube extends into stomach.

LEFT basilar thoracostomy tube and LEFT upper chest thoracostomy
tubes stable.

LEFT jugular central venous catheter with tip projecting over SVC.

Upper normal heart size.

Mediastinal contours and pulmonary vascularity normal.

Persistent LEFT basilar atelectasis and pleural effusion.

Remaining lungs clear.

No pneumothorax.
IMPRESSION: LEFT thoracostomy tubes with residual LEFT basilar effusion and
atelectasis.

No pneumothorax.

## 2017-11-09 MED ORDER — DEXMEDETOMIDINE HCL IN NACL 200 MCG/50ML IV SOLN
0.0000 ug/kg/h | INTRAVENOUS | Status: DC
  Administered 2017-11-09: 0.4 ug/kg/h via INTRAVENOUS
  Administered 2017-11-09: 0.2 ug/kg/h via INTRAVENOUS
  Administered 2017-11-10: 0.1 ug/kg/h via INTRAVENOUS
  Filled 2017-11-09 (×3): qty 50

## 2017-11-09 MED ORDER — SODIUM CHLORIDE 3 % IN NEBU
4.0000 mL | INHALATION_SOLUTION | Freq: Two times a day (BID) | RESPIRATORY_TRACT | Status: AC
  Administered 2017-11-09 – 2017-11-11 (×5): 4 mL via RESPIRATORY_TRACT
  Filled 2017-11-09 (×5): qty 4

## 2017-11-09 NOTE — Progress Notes (Signed)
PULMONARY / CRITICAL CARE MEDICINE   Name: Desmon Hitchner MRN: [id number] DOB: 16-Sep-1960    ADMISSION DATE:  10/31/2017 CONSULTATION DATE:  11/01/2017  REFERRING MD:  Dr. Maudie Mercury, [facility]  CHIEF COMPLAINT:  Short of breath  HISTORY OF PRESENT ILLNESS:   57 yo male dyspnea, hypoxia, flank pain, weight loss and cough.  Found to have aspiration pneumonia and Lt pleural effusion.  PMHx of HTN, CKD 1.  SUBJECTIVE:  No issues overnight  VITAL SIGNS: Blood Pressure (Abnormal) 132/93   Pulse 74   Temperature (Abnormal) 96.9 F (36.1 C) (Axillary)   Respiration 20   Height 6\' 2"  (1.88 m)   Weight 191 lb 2.2 oz (86.7 kg)   Oxygen Saturation 100%   Body Mass Index 24.54 kg/m    Intake/Output Summary (Last 24 hours) at 11/09/2017 0841 Last data filed at 11/09/2017 0400 Gross per 24 hour  Intake 1476.16 ml  Output 2510 ml  Net -1033.84 ml    PHYSICAL EXAMINATION: General: This is a 57 year old white male is currently sedated on the ventilator he is in no acute distress HEENT: Normocephalic atraumatic.  Mucous membranes are moist.  Dentition is poor.  Orally intubated. Pulmonary: Scattered rhonchi, equal chest rise, improved air movement in comparison to prior films.  Currently on pressure support ventilation, tidal volumes in the 4-500 range. Cardiac: Regular rate and rhythm Abdomen: Soft nontender no organomegaly Extremities: Dependent edema, scattered areas of ecchymosis. Neuro: Baseline, moves all extremities, interactive. LABS:  BMET Recent Labs  Lab 11/07/17 0544 11/08/17 0505 11/09/17 0631  NA 141 142 141  K 3.3* 3.3* 3.5  CL 107 110 109  CO2 25 24 26   BUN 11 12 10   CREATININE 1.04 0.91 0.86  GLUCOSE 98 108* 101*    Electrolytes Recent Labs  Lab 11/07/17 0544 11/07/17 1809 11/08/17 0505 11/08/17 1715 11/09/17 0631  CALCIUM 7.6*  --  7.7*  --  7.9*  MG 1.9 1.8 1.8 2.7*  --   PHOS 3.9 3.1 3.2 3.4  --     CBC Recent Labs  Lab 11/07/17 0544 11/08/17 0505  11/09/17 0631  WBC 9.9 8.0 7.9  HGB 8.7* 7.6* 7.7*  HCT 27.6* 23.8* 24.4*  PLT 340 272 308    Coag's Recent Labs  Lab 11/03/17 0115  APTT 31  INR 1.15    Sepsis Markers Recent Labs  Lab 11/05/17 1026 11/06/17 0334 11/07/17 0544  PROCALCITON 0.10 <0.10 <0.10    ABG Recent Labs  Lab 11/06/17 0355 11/07/17 1200 11/09/17 0250  PHART 7.439 7.488* 7.474*  PCO2ART 36.8 31.6* 37.7  PO2ART 89.8 327* 88.9    Liver Enzymes Recent Labs  Lab 11/07/17 0544 11/08/17 0505 11/09/17 0631  AST 14* 9* 10*  ALT 15* 12* 12*  ALKPHOS 124 86 116  BILITOT 0.7 0.6 0.6  ALBUMIN 1.9* 1.8* 1.9*    Cardiac Enzymes No results for input(s): TROPONINI, PROBNP in the last 168 hours.  Glucose Recent Labs  Lab 11/08/17 1116 11/08/17 1525 11/08/17 2051 11/09/17 0105 11/09/17 0417 11/09/17 0801  GLUCAP 104* 122* 104* 118* 100* 104*    Imaging Dg Chest Port 1 View  Result Date: 11/09/2017 CLINICAL DATA:  Pneumonia EXAM: PORTABLE CHEST 1 VIEW COMPARISON:  November 07, 2017 FINDINGS: The ETT and left central line are in good position. The NG tube terminates below today's film. No pneumothorax. No nodules or masses. Persistent left effusion with underlying opacity. Persistent left chest tubes. IMPRESSION: 1. Stable support apparatus. 2. Persistent left-sided pleural effusion  and underlying opacity. Electronically Signed   By: Dorise Bullion III M.D   On: 11/09/2017 07:14     STUDIES:  CT angio chest 4/04 >> debris in Lt main bronchus, consolidation LLL, loculated Lt pleural effusion CT head 4/04 >> atrophy, microvascular ischemic changes Echo 4/05 >> EF 65 to 70%, PAS 38 mmHg  CULTURES: Blood 4/04 >> Respiratory viral panel 4/05 >> negative HIV 4/05 >> negative Pneumococcal Ag 4/05 >> negative Legionella Ag 4/05 >> negative  ANTIBIOTICS: Vancomycin 4/04 >> Zosyn 4/04 >>   Procedures OETT 4/9>>> Bronch 4/9 Left ij CVL 4/9>>>   SIGNIFICANT EVENTS: 4/04 Admit 4/06 Off  pressors, transferred to [facility], TCTS consulted  RESOLVED ISSUES RI Septic shock   ASSESSMENT / PLAN:  Acute hypoxic respiratory failure in the setting of aspiration pneumonia Complicated by loculated Lt pleural effusion/Empyema status post left video-assisted thoracotomy on 4/7 now with postoperative atelectasis Repeat bronchoscopy completed on 4/11: Notable chest x-ray personally reviewed.This demonstrates the endotracheal tube, left IJ triple-lumen catheter, and left chest tube in satisfactory position.  The right lung is clear.  The left lung has basilar volume loss however aeration looks a little better. Plan Day #10 of vancomycin Continuing Zosyn, probably 14-day course Given history for today our goal will be to continue to pressure support ventilation throughout the day and overnight, would like to ensure he does not be recruited again, if no issues we will plan on extubation 4/14 Continue Meta neb Adding hypertonic saline, has completed over 48 hours of Mucomyst A.m. chest x-ray Chest tube management per thoracic surgery   Fluid and electrolyte imbalance: Hypokalemia Plan Replace and recheck Dysphagia. - seen by speech therapy 4/05 >> regular diet Plan Continue tube feeds  CKD 2.  serum creatinine had increased, now improved.  Looks like baseline somewhere between 1.2 and 1.3 Plan Continue to renal dose medications Strict intake output A.m. chemistry  Dementia ? Cognitive decline. Vs element of delirium Plan PAD protocol Continue thiamine and folate RASS goal 0--1  Anemia of critical illness. -Hemoglobin trending down, no evidence of bleeding, suspect element of hemodilution Plan Continue subcu heparin Trend CBC   DVT prophylaxis -PAS hose, start subcutaneous heparin SUP - Protonix Nutrition - tubefeeds Goals of care - full code  My ccmt 35 minutes   Erick Colace ACNP-BC [place] Pager # [phone] OR # [phone] if no  answer

## 2017-11-09 NOTE — Progress Notes (Signed)
[address]       [employer] [address]             [phone]      6 Days Post-Op Procedure(s) (LRB): FLEXIBLE BRONCHOSCOPY (Left) VIDEO ASSISTED THORACOSCOPY (VATS) FOR DRAINAGE OF PLEURAL EFFUSION (Left) Subjective: Alert, feels ok, answers yes/no questions appropriatly  Objective: Vital signs in last 24 hours: Temp:  [96.9 F (36.1 C)-99.8 F (37.7 C)] 96.9 F (36.1 C) (04/13 0745) Pulse Rate:  [60-89] 74 (04/13 0800) Cardiac Rhythm: Normal sinus rhythm (04/13 0400) Resp:  [12-24] 20 (04/13 0800) BP: (95-155)/(70-93) 132/93 (04/13 0800) SpO2:  [97 %-100 %] 100 % (04/13 0830) FiO2 (%):  [40 %] 40 % (04/13 0830) Weight:  [191 lb 2.2 oz (86.7 kg)] 191 lb 2.2 oz (86.7 kg) (04/13 0500)  Hemodynamic parameters for last 24 hours:    Intake/Output from previous day: 04/12 0701 - 04/13 0700 In: 1557 [I.V.:352; NG/GT:1105; IV Piggyback:100] Out: 3100 [Urine:2940; Stool:110; Chest Tube:50] Intake/Output this shift: No intake/output data recorded.  General appearance: alert, cooperative and no distress Heart: regular rate and rhythm Lungs: minor ronchi in upper airway, o/w clear anteriorly Abdomen: soft, nontender Extremities: no edema or calf tenderness  Lab Results: Recent Labs    11/08/17 0505 11/09/17 0631  WBC 8.0 7.9  HGB 7.6* 7.7*  HCT 23.8* 24.4*  PLT 272 308   BMET:  Recent Labs    11/08/17 0505 11/09/17 0631  NA 142 141  K 3.3* 3.5  CL 110 109  CO2 24 26  GLUCOSE 108* 101*  BUN 12 10  CREATININE 0.91 0.86  CALCIUM 7.7* 7.9*    PT/INR: No results for input(s): LABPROT, INR in the last 72 hours. ABG    Component Value Date/Time   PHART 7.474 (H) 11/09/2017 0250   HCO3 27.4 11/09/2017 0250   TCO2 41 09/28/2016 0957   ACIDBASEDEF 0.1 11/04/2017 0553   O2SAT 97.4 11/09/2017 0250   CBG (last 3)  Recent Labs    11/09/17 0105 11/09/17 0417 11/09/17 0801  GLUCAP 118* 100* 104*    Meds Scheduled Meds: .  chlorhexidine  15 mL Mouth Rinse BID  . docusate  100 mg Per Tube QHS  . heparin injection (subcutaneous)  5,000 Units Subcutaneous Q8H  . ipratropium-albuterol  3 mL Nebulization TID  . mouth rinse  15 mL Mouth Rinse 10 times per day  . sodium chloride HYPERTONIC  4 mL Nebulization BID  . thiamine injection  100 mg Intravenous Daily   Continuous Infusions: . dexmedetomidine (PRECEDEX) IV infusion    . feeding supplement (VITAL HIGH PROTEIN) 1,000 mL (11/09/17 0627)  . piperacillin-tazobactam (ZOSYN)  IV Stopped (11/09/17 0700)  . potassium chloride     PRN Meds:.bisacodyl, fentaNYL (SUBLIMAZE) injection, ondansetron (ZOFRAN) IV, potassium chloride, sodium chloride flush, traMADol  Xrays Dg Chest Port 1 View  Result Date: 11/09/2017 CLINICAL DATA:  Pneumonia EXAM: PORTABLE CHEST 1 VIEW COMPARISON:  November 07, 2017 FINDINGS: The ETT and left central line are in good position. The NG tube terminates below today's film. No pneumothorax. No nodules or masses. Persistent left effusion with underlying opacity. Persistent left chest tubes. IMPRESSION: 1. Stable support apparatus. 2. Persistent left-sided pleural effusion and underlying opacity. Electronically Signed   By: Dorise Bullion III M.D   On: 11/09/2017 07:14   Dg Chest Port 1 View  Result Date: 11/07/2017 CLINICAL DATA:  ETT AND OG PLACEMENT EXAM: PORTABLE CHEST 1 VIEW COMPARISON:  11/07/2017  FINDINGS: Endotracheal tube 7.5 cm from carina. Placement NG tube extends the stomach. Central venous line, 2 LEFT chest tubes unchanged. Marked reduction in volume of LEFT pleural effusion. Loculated fluid remains. RIGHT lung relatively clear. IMPRESSION: 1. Endotracheal tube in good position.  NGT placed appropriately 2. Marked reduction in LEFT pleural effusion. Electronically Signed   By: Suzy Bouchard M.D.   On: 11/07/2017 10:37    Assessment/Plan: S/P Procedure(s) (LRB): FLEXIBLE BRONCHOSCOPY (Left) VIDEO ASSISTED THORACOSCOPY (VATS) FOR  DRAINAGE OF PLEURAL EFFUSION (Left)  1 PCCM hoping to wean/ extubate by tomorrow- will leave CT for now, 110 cc out yesterday, no air leak    LOS: 9 days    Dan Salazar 11/09/2017

## 2017-11-09 NOTE — Progress Notes (Signed)
This RN informed Jadene Pierini PA that chest tube is not tidaling.  [employer] he would come by and check it.  Irven Baltimore, RN

## 2017-11-10 ENCOUNTER — Inpatient Hospital Stay (HOSPITAL_COMMUNITY): Payer: Self-pay

## 2017-11-10 LAB — GLUCOSE, CAPILLARY
GLUCOSE-CAPILLARY: 110 mg/dL — AB (ref 65–99)
GLUCOSE-CAPILLARY: 139 mg/dL — AB (ref 65–99)
Glucose-Capillary: 97 mg/dL (ref 65–99)
Glucose-Capillary: 88 mg/dL (ref 65–99)
Glucose-Capillary: 109 mg/dL — ABNORMAL HIGH (ref 65–99)

## 2017-11-10 LAB — BASIC METABOLIC PANEL
Anion gap: 7 (ref 5–15)
BUN: 16 mg/dL (ref 6–20)
CHLORIDE: 106 mmol/L (ref 101–111)
CO2: 26 mmol/L (ref 22–32)
CREATININE: 0.8 mg/dL (ref 0.61–1.24)
Calcium: 7.9 mg/dL — ABNORMAL LOW (ref 8.9–10.3)
GFR calc Af Amer: >60 mL/min (ref >60)
GFR calc non Af Amer: >60 mL/min (ref >60)
Glucose, Bld: 125 mg/dL — ABNORMAL HIGH (ref 65–99)
POTASSIUM: 3.4 mmol/L — AB (ref 3.5–5.1)
Sodium: 139 mmol/L (ref 135–145)

## 2017-11-10 IMAGING — DX DG CHEST 1V PORT
1 series · 1 of 1 positions shown · non-contrast
Comparison: [DATE]

CLINICAL DATA: Pneumonia.  Empyema.  ETT present

EXAM:
PORTABLE CHEST 1 VIEW

[chest]
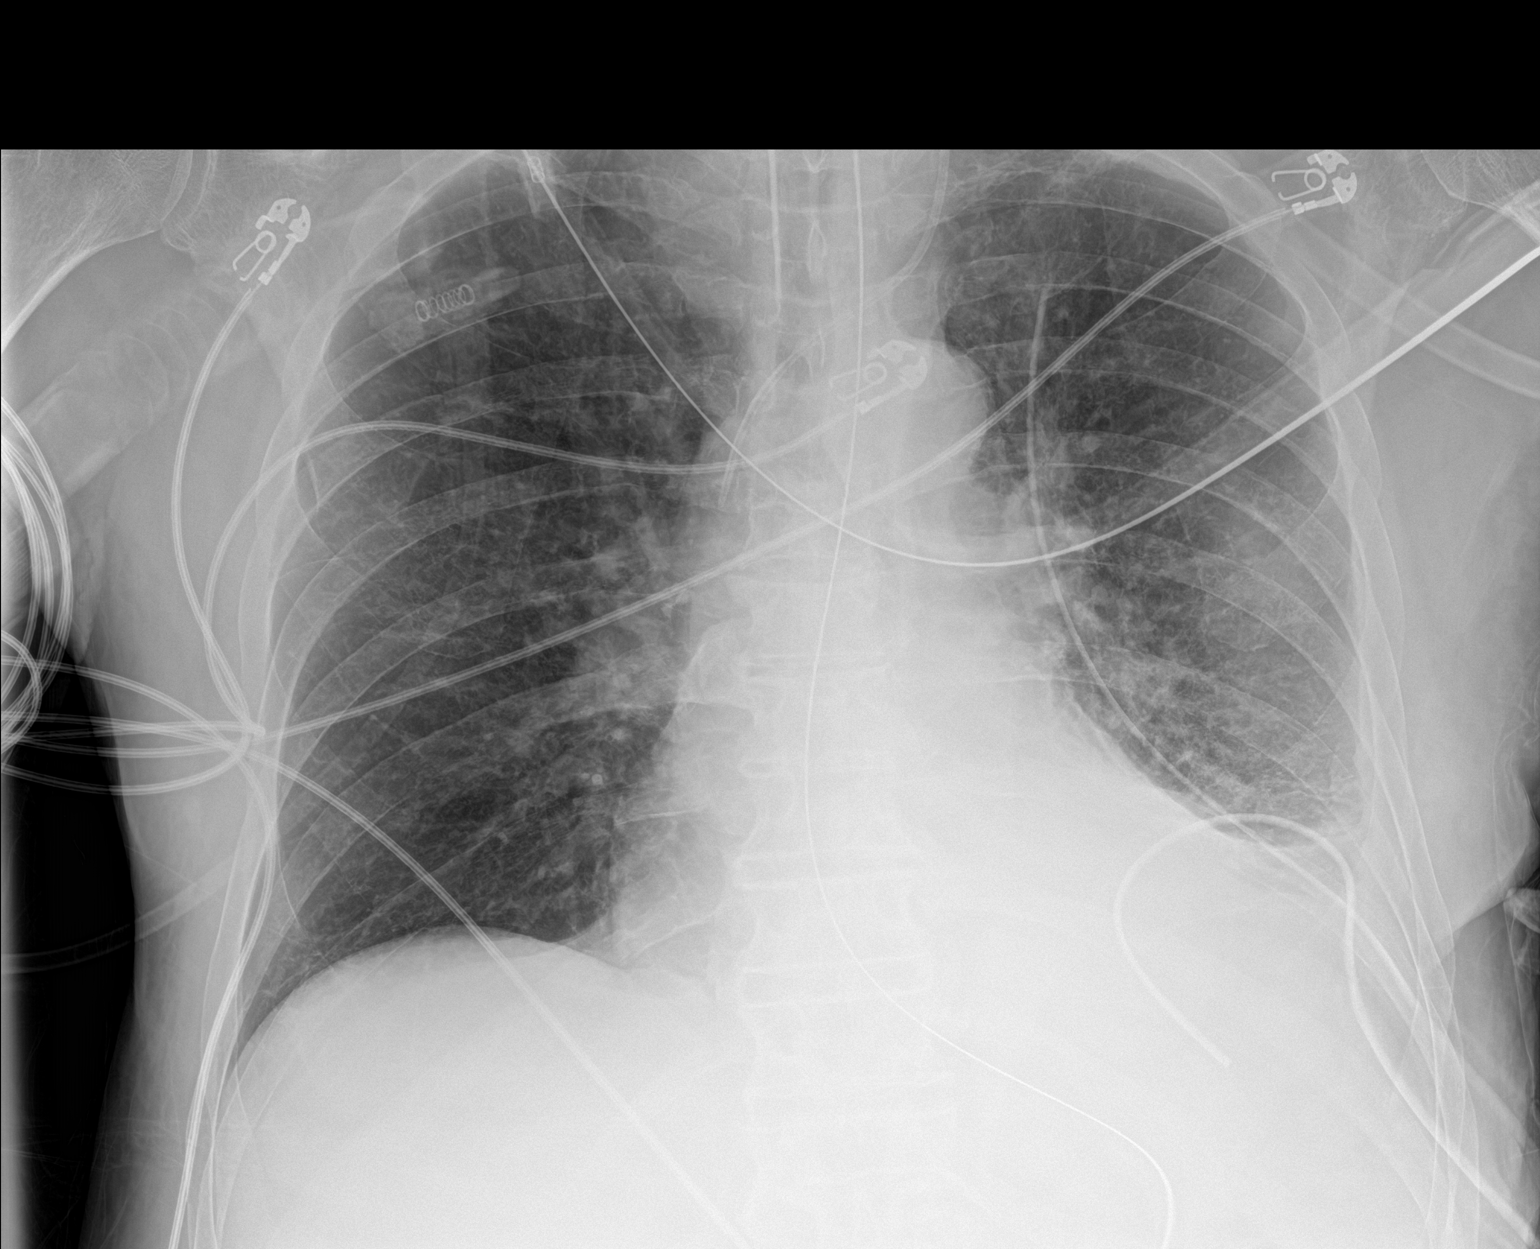

[1 of 1 positions shown; findings below may reference images not displayed]

FINDINGS: Support lines and tubes in appropriate position. Left pleural
catheter is again seen. Small left pleural effusion and left lower
lobe atelectasis or consolidation show no significant change. Right
lung is clear.
IMPRESSION: Stable small left pleural effusion and left lower lobe atelectasis
or consolidation.

## 2017-11-10 MED ORDER — FENTANYL CITRATE (PF) 100 MCG/2ML IJ SOLN
12.5000 ug | INTRAMUSCULAR | Status: DC | PRN
  Administered 2017-11-12: 12.5 ug via INTRAVENOUS
  Filled 2017-11-10: qty 2

## 2017-11-10 MED ORDER — POTASSIUM CHLORIDE 10 MEQ/100ML IV SOLN
10.0000 meq | INTRAVENOUS | Status: AC
  Administered 2017-11-10 (×4): 10 meq via INTRAVENOUS
  Filled 2017-11-10: qty 100

## 2017-11-10 NOTE — Progress Notes (Signed)
Pharmacy Antibiotic Note  Dan Salazar is a 57 y.o. male admitted on 10/31/2017 with pneumonia.  Pharmacy has been consulted for Zosyn dosing.   The patient is s/p VATS on 4/7 with post-op atelectasis. Patient was extubated 4/10 but had to be re-intubated 4/11 due to mucous plugging. Patient is to be extubated today. WBC WNL and patient is afebrile. Renal function is stable   Plan: Continue Zosyn 3.375g IV every 8 hours (infused over 4 hours) Today is D # 11/14- Stop date in place Monitor clinical s/sx of infection and renal function   Height: 6\' 2"  (188 cm) Weight: 184 lb 1.4 oz (83.5 kg) IBW/kg (Calculated) : 82.2  Temp (24hrs), Avg:98.5 F (36.9 C), Min:98 F (36.7 C), Max:99.3 F (37.4 C)  Recent Labs  Lab 11/04/17 0540 11/05/17 0548 11/07/17 0544 11/08/17 0505 11/09/17 0631 11/10/17 0327  WBC 15.2* 11.1* 9.9 8.0 7.9  --   CREATININE 1.06 1.08 1.04 0.91 0.86 0.80  VANCOTROUGH 16  --   --   --   --   --     Estimated Creatinine Clearance: 115.6 mL/min (by C-G formula based on SCr of 0.8 mg/dL).    Allergies  Allergen Reactions  . Hctz [Hydrochlorothiazide] Other (See Comments)    Severe electrolyte abnormalities    Antimicrobials this admission: 4/4 vanc >>4/13 4/4 cefepime >> 4/5 4/4 zosyn >>(4/17)  Microbiology results: 4/4 BCx >> ngtd x3 days 4/4MRSA PCR neg 4/5 resp virus panel neg 4/5 RCx neg 4/5 strep ag neg; legionella neg 4/7 Pleural fluid (VATS) >> no growth  4/11 Trach aspirate : rare candida tropicalis    Thank you for allowing pharmacy to be a part of this patient's care.  Jimmy Footman, PharmD, BCPS PGY2 Infectious Diseases Pharmacy Resident Pager: [phone]  11/10/2017 12:43 PM

## 2017-11-10 NOTE — Progress Notes (Addendum)
PULMONARY / CRITICAL CARE MEDICINE   Name: Dan Salazar MRN: [id number] DOB: 1960/12/08    ADMISSION DATE:  10/31/2017 CONSULTATION DATE:  11/01/2017  REFERRING MD:  Dr. Maudie Mercury, [facility]  CHIEF COMPLAINT:  Short of breath  HISTORY OF PRESENT ILLNESS:   57 yo male dyspnea, hypoxia, flank pain, weight loss and cough.  Found to have aspiration pneumonia and Lt pleural effusion.  PMHx of HTN, CKD 1.  SUBJECTIVE:  Ready for extubation  VITAL SIGNS: Blood Pressure (Abnormal) 136/108 (BP Location: Right Arm)   Pulse 88   Temperature 98.1 F (36.7 C) (Axillary)   Respiration (Abnormal) 22   Height 6\' 2"  (1.88 m)   Weight 184 lb 1.4 oz (83.5 kg)   Oxygen Saturation 100%   Body Mass Index 23.64 kg/m   Intake/Output Summary (Last 24 hours) at 11/10/2017 0839 Last data filed at 11/10/2017 0641 Gross per 24 hour  Intake 1329.05 ml  Output 1770 ml  Net -440.95 ml    PHYSICAL EXAMINATION: General: 57 year old male patient currently resting on pressure support ventilation no acute distress HEENT: Normocephalic atraumatic orally intubated dentition poor mucous membranes moist Pulmonary: Equal chest rise bilaterally, coarse breath sounds, basilar rales, left chest tube unremarkable.  Dressing clean dry and intact. Cardiac: Regular rate and rhythm Abdomen: Soft nontender Extremities: Warm dry dependent edema brisk cap refill scattered areas of ecchymosis strong pulses Neuro/psych: Awake, interactive, moves extremities, follows commands.  LABS:  BMET Recent Labs  Lab 11/08/17 0505 11/09/17 0631 11/10/17 0327  NA 142 141 139  K 3.3* 3.5 3.4*  CL 110 109 106  CO2 24 26 26   BUN 12 10 16   CREATININE 0.91 0.86 0.80  GLUCOSE 108* 101* 125*    Electrolytes Recent Labs  Lab 11/07/17 1809 11/08/17 0505 11/08/17 1715 11/09/17 0631 11/10/17 0327  CALCIUM  --  7.7*  --  7.9* 7.9*  MG 1.8 1.8 2.7*  --   --   PHOS 3.1 3.2 3.4  --   --     CBC Recent Labs  Lab 11/07/17 0544  11/08/17 0505 11/09/17 0631  WBC 9.9 8.0 7.9  HGB 8.7* 7.6* 7.7*  HCT 27.6* 23.8* 24.4*  PLT 340 272 308    Coag's No results for input(s): APTT, INR in the last 168 hours.  Sepsis Markers Recent Labs  Lab 11/05/17 1026 11/06/17 0334 11/07/17 0544  PROCALCITON 0.10 <0.10 <0.10    ABG Recent Labs  Lab 11/06/17 0355 11/07/17 1200 11/09/17 0250  PHART 7.439 7.488* 7.474*  PCO2ART 36.8 31.6* 37.7  PO2ART 89.8 327* 88.9    Liver Enzymes Recent Labs  Lab 11/07/17 0544 11/08/17 0505 11/09/17 0631  AST 14* 9* 10*  ALT 15* 12* 12*  ALKPHOS 124 86 116  BILITOT 0.7 0.6 0.6  ALBUMIN 1.9* 1.8* 1.9*    Cardiac Enzymes No results for input(s): TROPONINI, PROBNP in the last 168 hours.  Glucose Recent Labs  Lab 11/09/17 1126 11/09/17 1551 11/09/17 1933 11/09/17 2323 11/10/17 0327 11/10/17 0759  GLUCAP 118* 98 106* 107* 97 110*    Imaging Dg Chest Port 1 View  Result Date: 11/09/2017 CLINICAL DATA:  Chest tube EXAM: PORTABLE CHEST 1 VIEW COMPARISON:  Portable exam 1551 hours compared to 0520 hours FINDINGS: Tip of endotracheal tube projects 6.0 cm above carina. Nasogastric tube extends into stomach. LEFT basilar thoracostomy tube and LEFT upper chest thoracostomy tubes stable. LEFT jugular central venous catheter with tip projecting over SVC. Upper normal heart size. Mediastinal contours and pulmonary  vascularity normal. Persistent LEFT basilar atelectasis and pleural effusion. Remaining lungs clear. No pneumothorax. IMPRESSION: LEFT thoracostomy tubes with residual LEFT basilar effusion and atelectasis. No pneumothorax. Electronically Signed   By: Lavonia Dana M.D.   On: 11/09/2017 16:11     STUDIES:  CT angio chest 4/04 >> debris in Lt main bronchus, consolidation LLL, loculated Lt pleural effusion CT head 4/04 >> atrophy, microvascular ischemic changes Echo 4/05 >> EF 65 to 70%, PAS 38 mmHg  CULTURES: Blood 4/04 >> Respiratory viral panel 4/05 >>  negative HIV 4/05 >> negative Pneumococcal Ag 4/05 >> negative Legionella Ag 4/05 >> negative  ANTIBIOTICS: Vancomycin 4/04 >> stopped Zosyn 4/04 >>   Procedures OETT 4/9>>> 4/10; reintubated 4/11>>> 4/14 Bronch 4/9 and 4/11 Left ij CVL 4/9>>>   SIGNIFICANT EVENTS: 4/04 Admit 4/06 Off pressors, transferred to [facility], [place] consulted  RESOLVED ISSUES RI Septic shock   ASSESSMENT / PLAN:  Acute hypoxic respiratory failure in the setting of aspiration pneumonia Complicated by loculated Lt pleural effusion/Empyema status post left video-assisted thoracotomy on 4/7 now with postoperative atelectasis Repeat bronchoscopy completed on 4/11: Chest x-ray reviewed: Improved aeration, basilar atelectasis versus effusion chest tube in satisfactory position He is done well all evening on pressure support ventilation now ready for extubation Plan Extubate Wean oxygen Mobilize Continue Meta neb Continue hypertonic saline Day 11/14 Zosyn Chest tube management per thoracic surgery  Fluid and electrolyte imbalance: Hypokalemia Plan Replace and recheck  Dysphagia. - seen by speech therapy 4/05 >> regular diet Plan N.p.o. for now, consider advancing diet later  CKD 2.  serum creatinine had increased, now improved.  Looks like baseline somewhere between 1.2 and 1.3 Plan Trend chemistry   Dementia ? Cognitive decline. Vs element of delirium Plan Limit sedation Continue thiamine and folate  Anemia of critical illness. -Hemoglobin trending down, no evidence of bleeding, suspect element of hemodilution Plan Subcu heparin  Trend CBC Transfuse per protocol   DVT prophylaxis -PAS hose, start subcutaneous heparin SUP - Protonix Nutrition - tubefeeds-->NPO Goals of care - full code  Critical care times 35 minutes  Erick Colace ACNP-BC [place] Pager # [phone] OR # [phone] if no answer

## 2017-11-10 NOTE — Progress Notes (Signed)
Pt extubated per MD. Pt placed on 3 LPM [place] with Spo2 at 100% RR 18 HR 69. Breath sounds equal and bilateral. No stridor heard. Pt vocalized name as "[name]". Pt tol well. No complications at this time. Large amount of clear thick oral secretions with extubation.

## 2017-11-10 NOTE — Progress Notes (Addendum)
[address]       [employer] [address]             [phone]      7 Days Post-Op Procedure(s) (LRB): FLEXIBLE BRONCHOSCOPY (Left) VIDEO ASSISTED THORACOSCOPY (VATS) FOR DRAINAGE OF PLEURAL EFFUSION (Left) Subjective: Extubated, breathing comfortably, chest drains without further drainage  Objective: Vital signs in last 24 hours: Temp:  [98.1 F (36.7 C)-99.3 F (37.4 C)] 98.1 F (36.7 C) (04/14 0345) Pulse Rate:  [48-133] 88 (04/14 0804) Cardiac Rhythm: Normal sinus rhythm (04/14 0800) Resp:  [15-26] 22 (04/14 0804) BP: (94-183)/(56-117) 136/108 (04/14 0800) SpO2:  [99 %-100 %] 100 % (04/14 0850) FiO2 (%):  [40 %] 40 % (04/14 0804) Weight:  [83.5 kg (184 lb 1.4 oz)] 83.5 kg (184 lb 1.4 oz) (04/14 0428)  Hemodynamic parameters for last 24 hours:    Intake/Output from previous day: 04/13 0701 - 04/14 0700 In: 1329.1 [I.V.:204.1; NG/GT:975; IV Piggyback:150] Out: 1970 [Urine:1340; Stool:550; Chest Tube:80] Intake/Output this shift: No intake/output data recorded.  General appearance: alert, cooperative and no distress Heart: regular rate and rhythm Lungs: mildly coarse on left Abdomen: benign Extremities: no edema or calf tenderness Wound: incis healing well  Lab Results: Recent Labs    11/08/17 0505 11/09/17 0631  WBC 8.0 7.9  HGB 7.6* 7.7*  HCT 23.8* 24.4*  PLT 272 308   BMET:  Recent Labs    11/09/17 0631 11/10/17 0327  NA 141 139  K 3.5 3.4*  CL 109 106  CO2 26 26  GLUCOSE 101* 125*  BUN 10 16  CREATININE 0.86 0.80  CALCIUM 7.9* 7.9*    PT/INR: No results for input(s): LABPROT, INR in the last 72 hours. ABG    Component Value Date/Time   PHART 7.474 (H) 11/09/2017 0250   HCO3 27.4 11/09/2017 0250   TCO2 41 09/28/2016 0957   ACIDBASEDEF 0.1 11/04/2017 0553   O2SAT 97.4 11/09/2017 0250   CBG (last 3)  Recent Labs    11/09/17 2323 11/10/17 0327 11/10/17 0759  GLUCAP 107* 97 110*    Meds Scheduled Meds: .  chlorhexidine  15 mL Mouth Rinse BID  . heparin injection (subcutaneous)  5,000 Units Subcutaneous Q8H  . ipratropium-albuterol  3 mL Nebulization TID  . mouth rinse  15 mL Mouth Rinse 10 times per day  . sodium chloride HYPERTONIC  4 mL Nebulization BID  . thiamine injection  100 mg Intravenous Daily   Continuous Infusions: . piperacillin-tazobactam (ZOSYN)  IV Stopped (11/10/17 0548)  . potassium chloride    . potassium chloride     PRN Meds:.bisacodyl, fentaNYL (SUBLIMAZE) injection, potassium chloride, sodium chloride flush, traMADol  Xrays Dg Chest Port 1 View  Result Date: 11/10/2017 CLINICAL DATA:  Pneumonia.  Empyema.  ETT present EXAM: PORTABLE CHEST 1 VIEW COMPARISON:  11/09/2017 FINDINGS: Support lines and tubes in appropriate position. Left pleural catheter is again seen. Small left pleural effusion and left lower lobe atelectasis or consolidation show no significant change. Right lung is clear. IMPRESSION: Stable small left pleural effusion and left lower lobe atelectasis or consolidation. Electronically Signed   By: Earle Gell M.D.   On: 11/10/2017 08:40   Dg Chest Port 1 View  Result Date: 11/09/2017 CLINICAL DATA:  Chest tube EXAM: PORTABLE CHEST 1 VIEW COMPARISON:  Portable exam 1551 hours compared to 0520 hours FINDINGS: Tip of endotracheal tube projects 6.0 cm above carina. Nasogastric tube extends into stomach. LEFT basilar thoracostomy tube  and LEFT upper chest thoracostomy tubes stable. LEFT jugular central venous catheter with tip projecting over SVC. Upper normal heart size. Mediastinal contours and pulmonary vascularity normal. Persistent LEFT basilar atelectasis and pleural effusion. Remaining lungs clear. No pneumothorax. IMPRESSION: LEFT thoracostomy tubes with residual LEFT basilar effusion and atelectasis. No pneumothorax. Electronically Signed   By: Lavonia Dana M.D.   On: 11/09/2017 16:11   Dg Chest Port 1 View  Result Date: 11/09/2017 CLINICAL DATA:   Pneumonia EXAM: PORTABLE CHEST 1 VIEW COMPARISON:  November 07, 2017 FINDINGS: The ETT and left central line are in good position. The NG tube terminates below today's film. No pneumothorax. No nodules or masses. Persistent left effusion with underlying opacity. Persistent left chest tubes. IMPRESSION: 1. Stable support apparatus. 2. Persistent left-sided pleural effusion and underlying opacity. Electronically Signed   By: Dorise Bullion III M.D   On: 11/09/2017 07:14    Assessment/Plan: S/P Procedure(s) (LRB): FLEXIBLE BRONCHOSCOPY (Left) VIDEO ASSISTED THORACOSCOPY (VATS) FOR DRAINAGE OF PLEURAL EFFUSION (Left)  1 doing well, PCCM managing primary 2 will d/c chest drains today   LOS: 10 days    John Giovanni 11/10/2017   Chart reviewed, patient examined, agree with above. CXR looks ok and no drainage from tubes so can be removed.

## 2017-11-11 ENCOUNTER — Inpatient Hospital Stay (HOSPITAL_COMMUNITY): Payer: Self-pay

## 2017-11-11 DIAGNOSIS — J181 Lobar pneumonia, unspecified organism: Secondary | ICD-10-CM

## 2017-11-11 LAB — CULTURE, RESPIRATORY W GRAM STAIN

## 2017-11-11 LAB — BASIC METABOLIC PANEL
ANION GAP: 10 (ref 5–15)
BUN: 17 mg/dL (ref 6–20)
CHLORIDE: 103 mmol/L (ref 101–111)
CO2: 26 mmol/L (ref 22–32)
Calcium: 8.1 mg/dL — ABNORMAL LOW (ref 8.9–10.3)
Creatinine, Ser: 0.96 mg/dL (ref 0.61–1.24)
GFR calc non Af Amer: >60 mL/min (ref >60)
Glucose, Bld: 99 mg/dL (ref 65–99)
POTASSIUM: 3.5 mmol/L (ref 3.5–5.1)
SODIUM: 139 mmol/L (ref 135–145)

## 2017-11-11 LAB — CULTURE, RESPIRATORY

## 2017-11-11 LAB — GLUCOSE, CAPILLARY
GLUCOSE-CAPILLARY: 166 mg/dL — AB (ref 65–99)
Glucose-Capillary: 111 mg/dL — ABNORMAL HIGH (ref 65–99)
Glucose-Capillary: 113 mg/dL — ABNORMAL HIGH (ref 65–99)
Glucose-Capillary: 92 mg/dL (ref 65–99)

## 2017-11-11 IMAGING — DX DG CHEST 1V PORT
1 series · 1 of 1 positions shown · non-contrast
Comparison: Radiograph [DATE].

CLINICAL DATA: Atelectasis.

EXAM:
PORTABLE CHEST 1 VIEW

[chest ap]
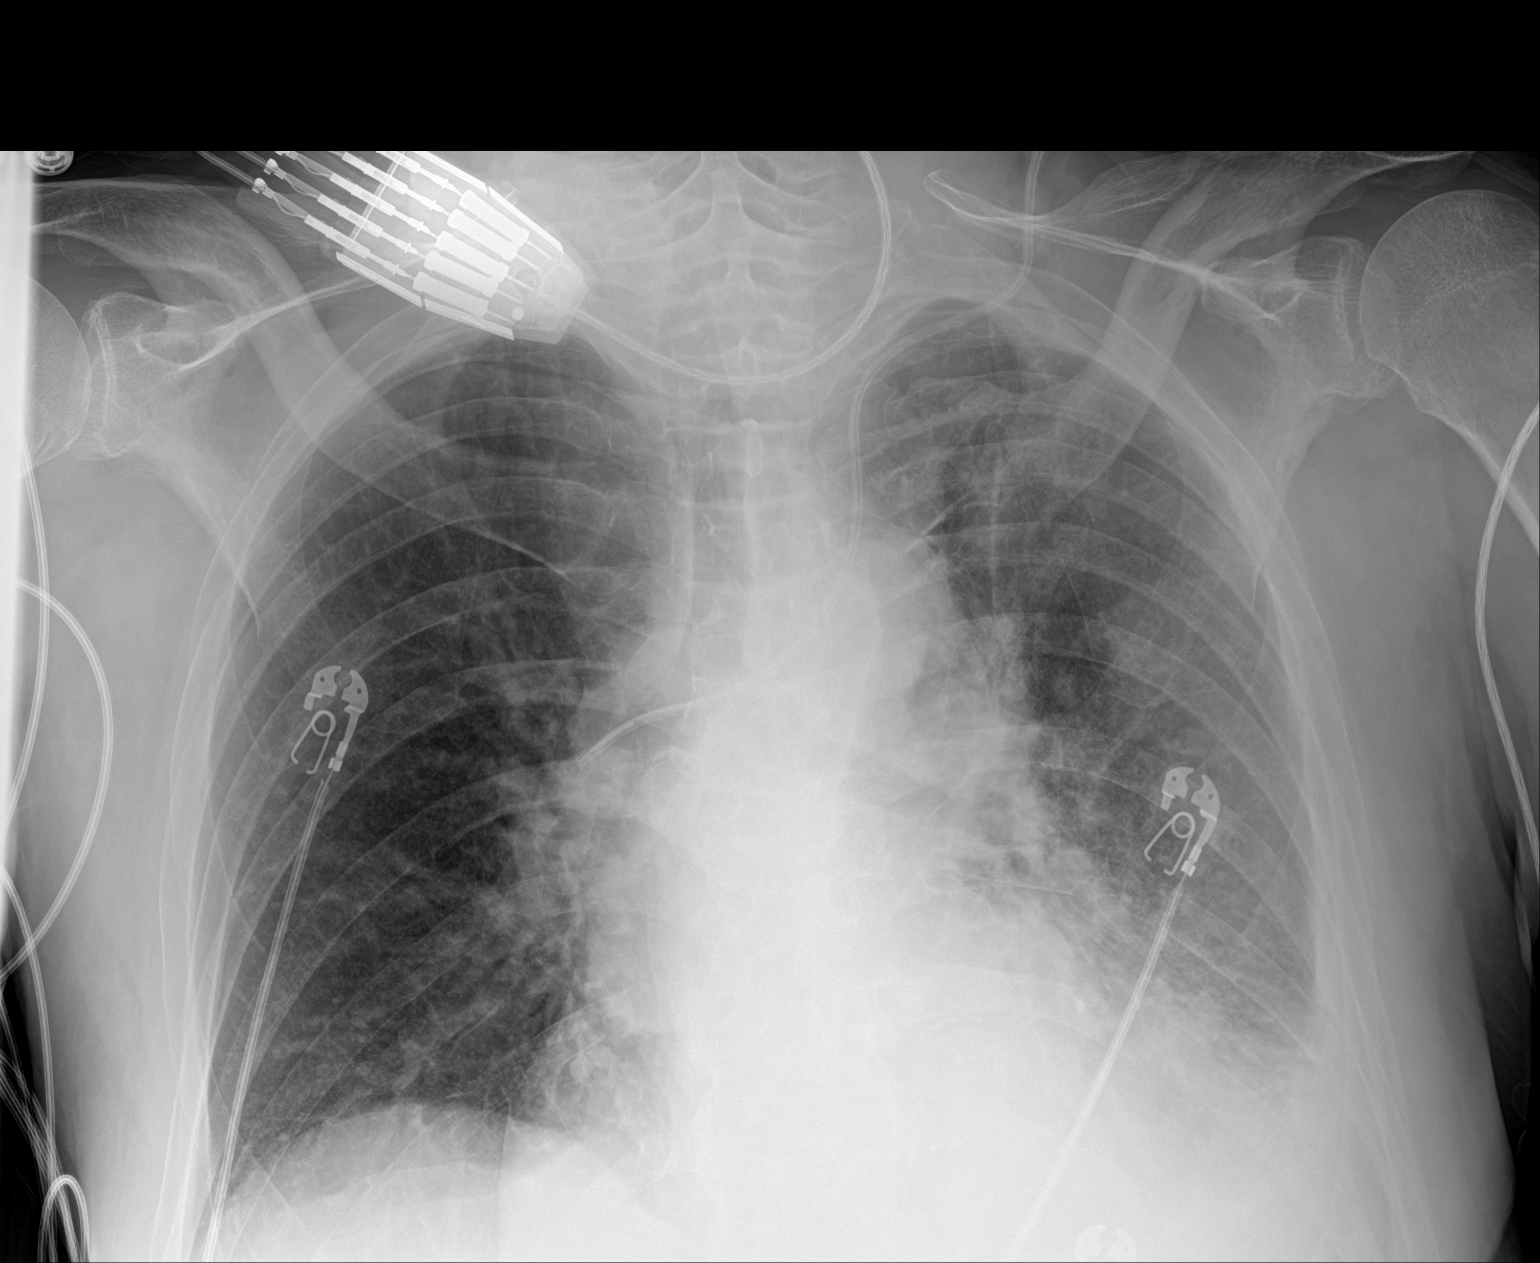

[1 of 1 positions shown; findings below may reference images not displayed]

FINDINGS: Stable cardiomediastinal silhouette. No pneumothorax is noted.
Endotracheal and nasogastric tubes have been removed. Left internal
jugular catheter is unchanged in position. No pneumothorax is noted.
Minimal right basilar subsegmental atelectasis is noted. Stable left
basilar atelectasis or infiltrate is noted with small left pleural
effusion. Left pleural drainage catheter is not visualized. Bony
thorax is unremarkable.
IMPRESSION: Stable left basilar atelectasis or infiltrate with small left
pleural effusion. Left pleural drainage catheter is no longer
visualized. Endotracheal and nasogastric tubes have been removed.

## 2017-11-11 MED ORDER — AMOXICILLIN-POT CLAVULANATE 875-125 MG PO TABS
1.0000 | ORAL_TABLET | Freq: Two times a day (BID) | ORAL | Status: AC
  Administered 2017-11-11 – 2017-11-13 (×6): 1 via ORAL
  Filled 2017-11-11 (×7): qty 1

## 2017-11-11 MED ORDER — POTASSIUM CHLORIDE CRYS ER 20 MEQ PO TBCR
40.0000 meq | EXTENDED_RELEASE_TABLET | Freq: Once | ORAL | Status: AC
  Administered 2017-11-11: 40 meq via ORAL
  Filled 2017-11-11: qty 2

## 2017-11-11 MED ORDER — DIPHENOXYLATE-ATROPINE 2.5-0.025 MG PO TABS
1.0000 | ORAL_TABLET | Freq: Four times a day (QID) | ORAL | Status: AC
  Administered 2017-11-11: 1 via ORAL
  Filled 2017-11-11: qty 1

## 2017-11-11 MED ORDER — SACCHAROMYCES BOULARDII 250 MG PO CAPS
250.0000 mg | ORAL_CAPSULE | Freq: Two times a day (BID) | ORAL | Status: DC
  Administered 2017-11-11 – 2017-11-14 (×7): 250 mg via ORAL
  Filled 2017-11-11 (×7): qty 1

## 2017-11-11 MED ORDER — JUVEN PO PACK
1.0000 | PACK | Freq: Two times a day (BID) | ORAL | Status: DC
  Administered 2017-11-11 – 2017-11-14 (×7): 1 via ORAL
  Filled 2017-11-11 (×9): qty 1

## 2017-11-11 NOTE — Progress Notes (Signed)
Dentures in pink cup - denture care after mouth rinsed. Noted that top denture plate is cracked in 1/2 - patient says they have been like that & will not eat until he puts them in his mouth says once they are in they work fine . Bottom bridge completely intact.

## 2017-11-11 NOTE — Progress Notes (Signed)
Nutrition Follow-up  DOCUMENTATION CODES:   Not applicable  INTERVENTION:   Juven Fruit Punch BID, each serving provides 80kcal and 14g of protein (amino acids glutamine and arginine)  NUTRITION DIAGNOSIS:   Increased nutrient needs related to post-op healing as evidenced by estimated needs.  Ongoing  GOAL:   Patient will meet greater than or equal to 90% of their needs  Meeting  MONITOR:   PO intake, Supplement acceptance, Weight trends, Labs, I & O's  REASON FOR ASSESSMENT:   Consult Assessment of nutrition requirement/status  ASSESSMENT:    57 yo male admitted with moderate to large left-sided pleural effusion with adjacent left lung collapse, aspiration penumonia with septic shock. Pt with hx of HTN, hernia repair  4/7 VATS for drainage of empyema and decortication 4/8 Elective intubation with bronch 4/10 Extubated 4/11 Re-intubated 4/14 Extubated  Pt continues to have diarrhea most likely related to tube feeding or antibiotic treatments. Flex-Seal remains in place. Plan to add Lomotil.   Pt advanced to regular diet 4/14. Ate 100% of breakfast this morning. When asked about his appetite pt reports "I eat everything I can get my hands on." RD discussed supplement options to aid with wound healing. RD to order Juven.   Weight noted to decrease 7 lb since last RD visit 4/10 (189 lb to 182 lb). Will continue to monitor trends.   I/O: + 7358 ml since admit UOP: 550 ml this morning  Medications reviewed and include: Lomotil, thiamine Labs reviewed.   Diet Order:  Diet regular Room service appropriate? Yes; Fluid consistency: Thin  EDUCATION NEEDS:   Not appropriate for education at this time  Skin:  Skin Assessment: Skin Integrity Issues: Skin Integrity Issues:: Incisions Incisions: closed chest  Last BM:  4/14- 400 ml rectal tube  Height:   Ht Readings from Last 1 Encounters:  11/02/17 6\' 2"  (1.88 m)    Weight:   Wt Readings from Last 1  Encounters:  11/11/17 182 lb 15.7 oz (83 kg)    Ideal Body Weight:  86.4 kg  BMI:  Body mass index is 23.49 kg/m.  Estimated Nutritional Needs:   Kcal:  2100-2300 kcal  Protein:  105-115 g  Fluid:  >2.1 L/day    Mariana Single RD, LDN Clinical Nutrition Pager # - [phone]

## 2017-11-11 NOTE — Progress Notes (Addendum)
Pt transferred to [facility] per w/c by NT and RN  - placed in chair & monitor on. Has cell phone and charger. Called POA to let him know that pt was transferred.

## 2017-11-11 NOTE — Progress Notes (Signed)
Patient refused treatment at this time, no distress noted RCP will continue to monitor.

## 2017-11-11 NOTE — Progress Notes (Signed)
NCM to follow for home health needs.

## 2017-11-11 NOTE — Progress Notes (Addendum)
Pt ambulated in hallway did very well 1st 100 ft - 02 sats  high 90's on 2 L n/c  needed lots of verbal queuing to keep head up and look forward & not down. Last 200 ft pt started slowing down -  Sats dropped to 70's just briefly - reminded pt to breathe through his nose - back up immediately to 90's increased 02 to 3 L n/c rest of time - pt rolled back to the room in a chair. To chair in room utilizing chair alarm due to pt's short term memory loss.

## 2017-11-11 NOTE — Progress Notes (Signed)
PULMONARY / CRITICAL CARE MEDICINE   Name: Dan Salazar MRN: [id number] DOB: [birth year]    ADMISSION DATE:  10/31/2017 CONSULTATION DATE:  11/01/2017  REFERRING MD:  Dr. Maudie Mercury, [facility]  CHIEF COMPLAINT:  Short of breath  HISTORY OF PRESENT ILLNESS:   57 yo male dyspnea, hypoxia, flank pain, weight loss and cough.  Found to have aspiration pneumonia and Lt pleural effusion.  PMHx of HTN, CKD 1.  SUBJECTIVE:  Awake, no distress, sitting up in bed.  A little cantankerous this morning  VITAL SIGNS: Blood Pressure 128/78   Pulse 61   Temperature 97.8 F (36.6 C) (Oral)   Respiration 18   Height 6\' 2"  (1.88 m)   Weight 182 lb 15.7 oz (83 kg)   Oxygen Saturation 100%   Body Mass Index 23.49 kg/m   Intake/Output Summary (Last 24 hours) at 11/11/2017 0945 Last data filed at 11/11/2017 0600 Gross per 24 hour  Intake 1215 ml  Output 1550 ml  Net -335 ml    PHYSICAL EXAMINATION: General: 57 year old white male currently awake oriented and appropriate he is in no distress HEENT: Normocephalic atraumatic mucous membranes are moist dentition poor no jugular venous distention left IJ triple-lumen catheter is unremarkable Pulmonary: Scattered rhonchi equal air entry no distress no accessory use left chest tube site unremarkable following removal Cardiac: Regular rate and rhythm Abdomen: Soft nontender no organomegaly.  Flexi-Seal is in place liquid stools noted. Skin.  Excoriated near Flexi-Seal otherwise intact Neuro awake alert oriented to self short-term memory deficits at baseline cooperative but needs significant direction LABS:  BMET Recent Labs  Lab 11/09/17 0631 11/10/17 0327 11/11/17 0630  NA 141 139 139  K 3.5 3.4* 3.5  CL 109 106 103  CO2 26 26 26   BUN 10 16 17   CREATININE 0.86 0.80 0.96  GLUCOSE 101* 125* 99    Electrolytes Recent Labs  Lab 11/07/17 1809 11/08/17 0505 11/08/17 1715 11/09/17 0631 11/10/17 0327 11/11/17 0630  CALCIUM  --  7.7*  --  7.9* 7.9*  8.1*  MG 1.8 1.8 2.7*  --   --   --   PHOS 3.1 3.2 3.4  --   --   --     CBC Recent Labs  Lab 11/07/17 0544 11/08/17 0505 11/09/17 0631  WBC 9.9 8.0 7.9  HGB 8.7* 7.6* 7.7*  HCT 27.6* 23.8* 24.4*  PLT 340 272 308    Coag's No results for input(s): APTT, INR in the last 168 hours.  Sepsis Markers Recent Labs  Lab 11/05/17 1026 11/06/17 0334 11/07/17 0544  PROCALCITON 0.10 <0.10 <0.10    ABG Recent Labs  Lab 11/06/17 0355 11/07/17 1200 11/09/17 0250  PHART 7.439 7.488* 7.474*  PCO2ART 36.8 31.6* 37.7  PO2ART 89.8 327* 88.9    Liver Enzymes Recent Labs  Lab 11/07/17 0544 11/08/17 0505 11/09/17 0631  AST 14* 9* 10*  ALT 15* 12* 12*  ALKPHOS 124 86 116  BILITOT 0.7 0.6 0.6  ALBUMIN 1.9* 1.8* 1.9*    Cardiac Enzymes No results for input(s): TROPONINI, PROBNP in the last 168 hours.  Glucose Recent Labs  Lab 11/10/17 1153 11/10/17 1518 11/10/17 1936 11/11/17 0012 11/11/17 0441 11/11/17 0744  GLUCAP 88 139* 109* 113* 92 111*    Imaging Dg Chest Port 1 View  Result Date: 11/11/2017 CLINICAL DATA:  Atelectasis. EXAM: PORTABLE CHEST 1 VIEW COMPARISON:  Radiograph of November 10, 2017. FINDINGS: Stable cardiomediastinal silhouette. No pneumothorax is noted. Endotracheal and nasogastric tubes have been removed. Left  internal jugular catheter is unchanged in position. No pneumothorax is noted. Minimal right basilar subsegmental atelectasis is noted. Stable left basilar atelectasis or infiltrate is noted with small left pleural effusion. Left pleural drainage catheter is not visualized. Bony thorax is unremarkable. IMPRESSION: Stable left basilar atelectasis or infiltrate with small left pleural effusion. Left pleural drainage catheter is no longer visualized. Endotracheal and nasogastric tubes have been removed. Electronically Signed   By: Marijo Conception, M.D.   On: 11/11/2017 07:22     STUDIES:  CT angio chest 4/04 >> debris in Lt main bronchus,  consolidation LLL, loculated Lt pleural effusion CT head 4/04 >> atrophy, microvascular ischemic changes Echo 4/05 >> EF 65 to 70%, PAS 38 mmHg  CULTURES: Blood 4/04 >> Respiratory viral panel 4/05 >> negative HIV 4/05 >> negative Pneumococcal Ag 4/05 >> negative Legionella Ag 4/05 >> negative  ANTIBIOTICS: Vancomycin 4/04 >> stopped Zosyn 4/04 >> 4/15 Augmentin 4/15  Procedures OETT 4/9>>> 4/10; reintubated 4/11>>> 4/14 Bronch 4/9 and 4/11 Left ij CVL 4/9>>>   SIGNIFICANT EVENTS: 4/04 Admit 4/06 Off pressors, transferred to [facility] [place] consulted  RESOLVED ISSUES RI Septic shock   ASSESSMENT / PLAN:  Acute hypoxic respiratory failure in the setting of aspiration pneumonia Complicated by loculated Lt pleural effusion/Empyema status post left video-assisted thoracotomy on 4/7 now with postoperative atelectasis Repeat bronchoscopy completed on 4/11: Portable chest x-ray personally reviewed: This demonstrates left basilar atelectasis possible small effusion side remains up Plan Continue aggressive pulmonary hygiene utilizing mandatory Meta neb to deliver hypertonic saline and Ventolin Wean oxygen Incentive spirometry Out of bed/ambulate Day #12/14 antibiotics.  I will change him to Augmentin to complete his course extubate  Fluid and electrolyte imbalance: Hypokalemia Plan Replace and recheck  Dysphagia. - seen by speech therapy 4/05 >> regular diet Plan Regular diet  CKD 2.  serum creatinine had increased, now improved.  Looks like baseline somewhere between 1.2 and 1.3 Plan Intermittent chemistries  Diarrhea.  Suspect tube feeding and antibiotic related. Plan Add probiotic Flexi-Seal for now May add Lomotil  Dementia ? Cognitive decline. Vs element of delirium Plan Thiamine and folate  Anemia of critical illness. -Hemoglobin trending down, no evidence of bleeding, suspect element of hemodilution Plan Continuing subcu heparin Intermittent CBC   DVT  prophylaxis - subcutaneous heparin SUP - Protonix Nutrition - tubefeeds--regular diet Goals of care - full code   Erick Colace ACNP-BC [place] Pager # [phone] OR # [phone] if no answer

## 2017-11-11 NOTE — Progress Notes (Signed)
Visited with patient.  He requested prayer so I prayed with him and he was very Patent attorney. Conard Novak, Chaplain   11/11/17 1400  Clinical Encounter Type  Visited With Patient  Visit Type Initial;Spiritual support  Referral From Nurse  Consult/Referral To Chaplain  Spiritual Encounters  Spiritual Needs Prayer  Stress Factors  Patient Stress Factors None identified  Family Stress Factors None identified

## 2017-11-11 NOTE — Progress Notes (Signed)
Called report to 2 [place]. Pt will be transferring to 2 [address] bed 19 per w/c . Pt has dentures, and cell phone and charger.

## 2017-11-11 NOTE — Progress Notes (Signed)
8 Days Post-Op Procedure(s) (LRB): FLEXIBLE BRONCHOSCOPY (Left) VIDEO ASSISTED THORACOSCOPY (VATS) FOR DRAINAGE OF PLEURAL EFFUSION (Left) Subjective: No complaints Minimal pain, denies SOB, nausea  Objective: Vital signs in last 24 hours: Temp:  [97.8 F (36.6 C)-98.4 F (36.9 C)] 98.4 F (36.9 C) (04/15 1122) Pulse Rate:  [61-83] 81 (04/15 1000) Cardiac Rhythm: Normal sinus rhythm (04/15 0800) Resp:  [8-23] 16 (04/15 1000) BP: (111-143)/(75-102) 129/95 (04/15 1000) SpO2:  [87 %-100 %] 100 % (04/15 1000) Weight:  [182 lb 15.7 oz (83 kg)] 182 lb 15.7 oz (83 kg) (04/15 0500)  Hemodynamic parameters for last 24 hours:    Intake/Output from previous day: 04/14 0701 - 04/15 0700 In: 1215 [P.O.:960; IV Piggyback:125] Out: 1550 [Urine:1150; Stool:400] Intake/Output this shift: Total I/O In: 300 [P.O.:300] Out: 550 [Urine:550]  General appearance: alert, cooperative and no distress Heart: regular rate and rhythm Lungs: diminished breath sounds left base Wound: clean and dry  Lab Results: Recent Labs    11/09/17 0631  WBC 7.9  HGB 7.7*  HCT 24.4*  PLT 308   BMET:  Recent Labs    11/10/17 0327 11/11/17 0630  NA 139 139  K 3.4* 3.5  CL 106 103  CO2 26 26  GLUCOSE 125* 99  BUN 16 17  CREATININE 0.80 0.96  CALCIUM 7.9* 8.1*    PT/INR: No results for input(s): LABPROT, INR in the last 72 hours. ABG    Component Value Date/Time   PHART 7.474 (H) 11/09/2017 0250   HCO3 27.4 11/09/2017 0250   TCO2 41 09/28/2016 0957   ACIDBASEDEF 0.1 11/04/2017 0553   O2SAT 97.4 11/09/2017 0250   CBG (last 3)  Recent Labs    11/11/17 0441 11/11/17 0744 11/11/17 1129  GLUCAP 92 111* 166*    Assessment/Plan: S/P Procedure(s) (LRB): FLEXIBLE BRONCHOSCOPY (Left) VIDEO ASSISTED THORACOSCOPY (VATS) FOR DRAINAGE OF PLEURAL EFFUSION (Left)  CXR shows good aeration of left upper lobe, still with consolidation of lower lobe. I suspect that will take several weeks to  resolve Appetite is good CT are out Antibiotics changed to PO Augmentin  LOS: 11 days    Melrose Nakayama 11/11/2017

## 2017-11-12 ENCOUNTER — Inpatient Hospital Stay (HOSPITAL_COMMUNITY): Payer: Self-pay

## 2017-11-12 LAB — CBC
HCT: 32.4 % — ABNORMAL LOW (ref 39.0–52.0)
Hemoglobin: 10.1 g/dL — ABNORMAL LOW (ref 13.0–17.0)
MCH: 30 pg (ref 26.0–34.0)
MCHC: 31.2 g/dL (ref 30.0–36.0)
MCV: 96.1 fL (ref 78.0–100.0)
Platelets: 351 10*3/uL (ref 150–400)
RBC: 3.37 MIL/uL — ABNORMAL LOW (ref 4.22–5.81)
RDW: 15.1 % (ref 11.5–15.5)
WBC: 8.2 10*3/uL (ref 4.0–10.5)

## 2017-11-12 IMAGING — DX DG CHEST 1V PORT
1 series · 1 of 1 positions shown · non-contrast
Comparison: [DATE]

CLINICAL DATA: Pneumonia

EXAM:
PORTABLE CHEST 1 VIEW

[chest ap]
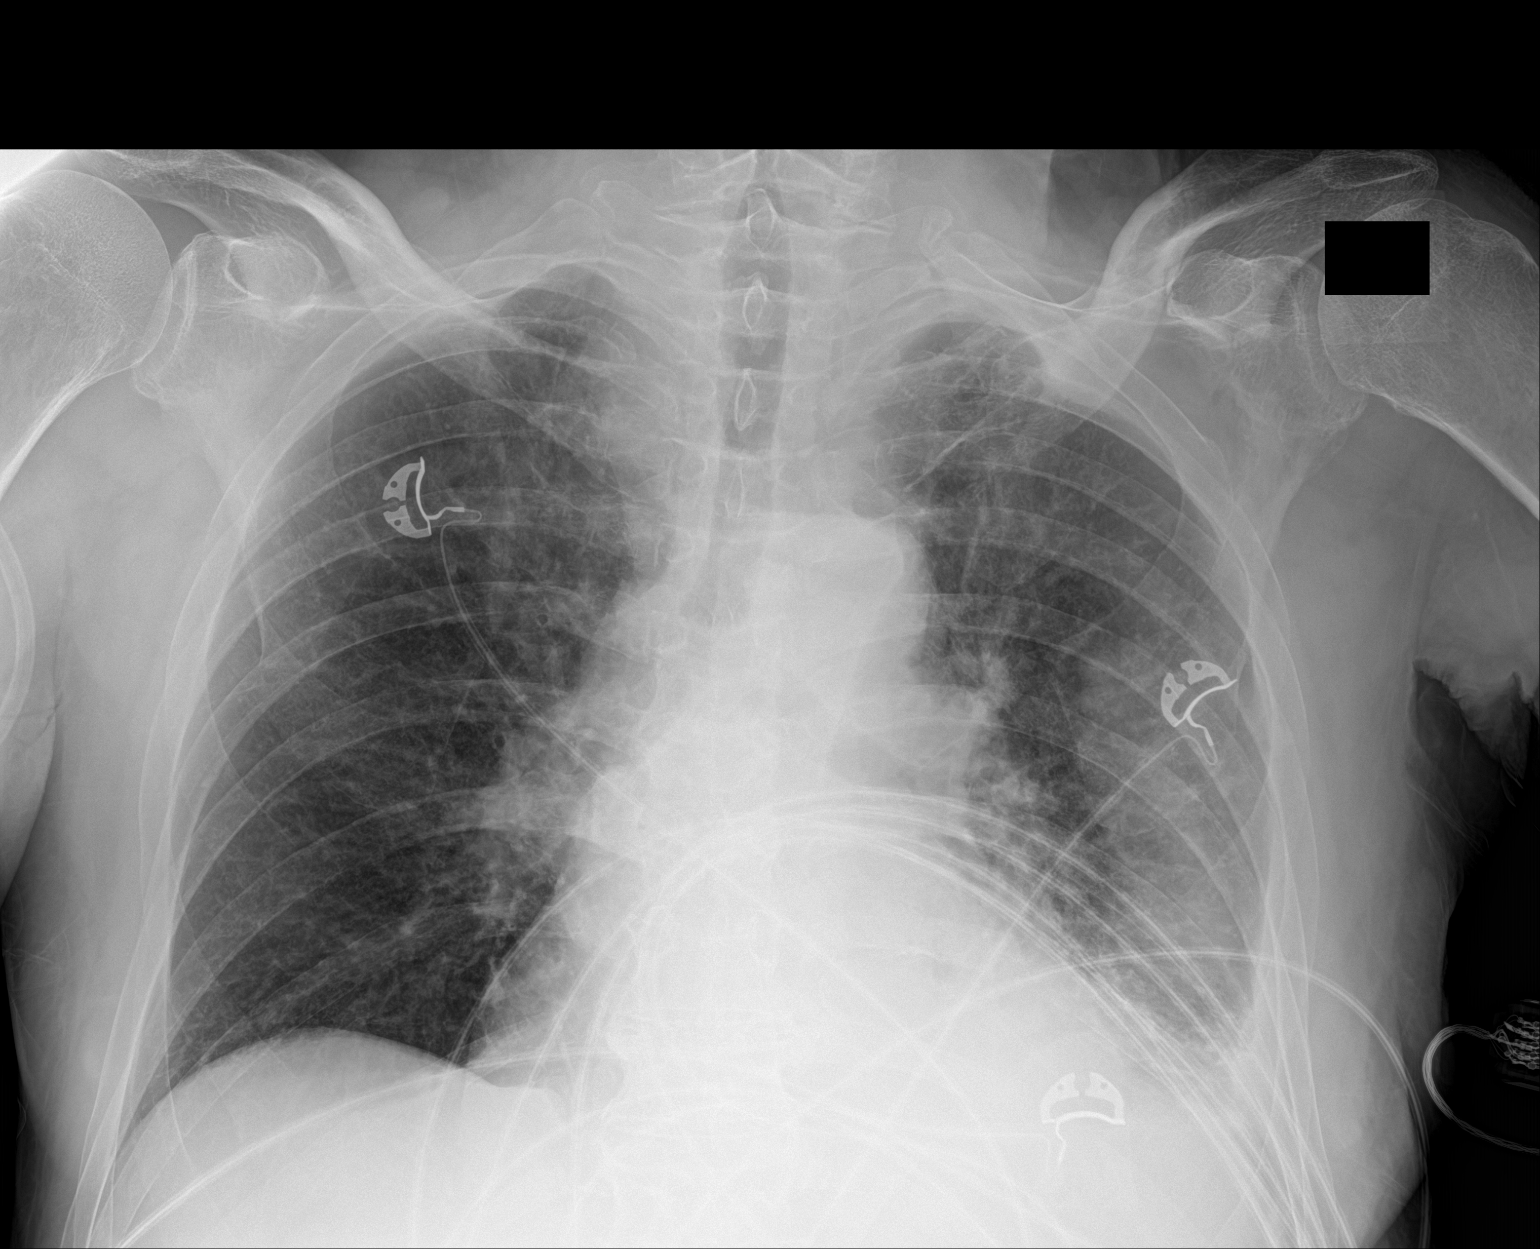

[1 of 1 positions shown; findings below may reference images not displayed]

FINDINGS: Improved aeration in the left lung base with decrease in airspace
disease and small left effusion.

Improved aeration in the right lung base which now clear.

Central venous catheter has been removed.
IMPRESSION: Interval improvement in aeration of the lung bases. Persistent left
lower lobe airspace disease and left pleural effusion with
improvement since the prior study.

## 2017-11-12 MED ORDER — CYANOCOBALAMIN 1000 MCG/ML IJ SOLN
100.0000 ug | Freq: Every day | INTRAMUSCULAR | Status: DC
  Administered 2017-11-13 – 2017-11-14 (×2): 100 ug via INTRAMUSCULAR
  Filled 2017-11-12 (×2): qty 1

## 2017-11-12 MED ORDER — CYANOCOBALAMIN 1000 MCG/ML IJ SOLN
1000.0000 ug | Freq: Once | INTRAMUSCULAR | Status: AC
  Administered 2017-11-12: 1000 ug via INTRAMUSCULAR
  Filled 2017-11-12: qty 1

## 2017-11-12 NOTE — Care Management Note (Addendum)
Case Management Note  Patient Details  Name: Dan Salazar MRN: [id number] Date of Birth: 03-Mar-1961  Subjective/Objective:    Pleural effusion, Empyema, PNA, acute resp failure, S/P VATS on 11/03/2017               Action/Plan: NCM spoke to pt at bedside. Gave permission to speak to friend, [name]. States he does essentially live alone but his dtr comes and goes. Pt had the [place] orange card from 03/12/2017-09/12/2017. His card has expired. He will have to go to the [facility] on [place] to reestablish. NCM arrange appt with [facility] on 12/11/2017 at 1:50 pm. Pt will be able to follow up with financial counselor in clinic to reapply for orange card. States he has applied for disability.  Attempted call to [facility] and left HIPPA compliant message for return call.   11/12/2017 Received call from [facility], states he was in the process of getting his orange card renewed at his appt with [facility] when the office sent him to ED. States he was getting his physical for SS disability. He is assisting pt with getting his disability. He takes pt to his appts, paying bills, getting food and medications. Will need HHRN, PT, SW and aide at dc. Notified attending.   [place] letter to assist with medication. Will have $ 3 copay and one time use per year. (left on shadow chart) Contacted [facility] with [facility] referral for [facility].    11/13/2017 Arranged appt with [facility] on 11/21/2017 at 2:30 pm on [facility] letter. Updated [name] POA, states he will have some from the church pick him up at time of dc. Cancelled appt with [facility].    Expected Discharge Date:             Expected Discharge Plan:  [place]  In-House Referral:  Chaplain, Nutrition  Discharge planning Services  CM Consult  Post Acute Care Choice:  Home Health Choice offered to:  Patient  DME Arranged:  N/A DME Agency:  NA  HH Arranged:  RN [place] Agency:     Status of Service:  In process, will  continue to follow  If discussed at Long Length of Stay Meetings, dates discussed:    Additional Comments:  Erenest Rasher, RN 11/12/2017, 4:07 PM

## 2017-11-12 NOTE — Progress Notes (Signed)
PROGRESS NOTE    Dan Salazar  LEX:[id number] DOB: [birth year] DOA: 10/31/2017 PCP: Mardi Mainland, FNP   Brief Narrative: 57 year old man with history of hypertension and chronic kidney disease stage I, admitted with an aspiration pneumonia and associated complex left pleural effusion, septic shock.  He underwent VATS decortication on 4/7.  The subsequent course has been complicated by severe atelectasis and collapse.  He is undergone bronchoscopy twice to clear secretions and plugging.  Note that is also he had some evidence for delirium versus short term memory deficits, question alcohol related.  Assessment & Plan:   Principal Problem:   Pleural effusion on left Active Problems:   Hyponatremia   Tachycardia   Pneumonia   Atelectasis   Difficult intravenous access   Encounter for intubation   Acute respiratory failure with hypoxia (HCC)   Acute hypoxic respiratory failure in the setting of aspiration pneumonia Complicated by loculated Lt pleural effusion/Empyema status post left video-assisted thoracotomy on 4/7 now with postoperative atelectasis -Bronchoscopy 4-11 to remove secretions.  -nebulizer treatment. On hypertonic solution nebulizer,  -Treated initially with IV vancomycin and Zosyn.  -Day 13/14 antibiotics. Now on Augmentin.  -S/P chest tube removed 4-14.  -Intubated 4-09----Extubated 4-14 -Chest x ray stable.   Dysphagia; evaluated by speech. Now on regular diet.   CKD stage 2;  Memory problems;  B-12 low at 84. Start B 12 supplements. IM On thiamine.  TSH normal.  Thiamine and folate,.   Anemia;  Will replete B 12.  Check labs today and tomorrow.  Hb 7.7 on 4-13.   DVT prophylaxis: Heparin  Code Status: full code.  Family Communication: care discussed with patient  Disposition Plan: discharge when ok by CVTS>    Consultants:   CVTS   Procedures:      Antimicrobials:   Vanc/ zosyn   Now on Augmentin.    Subjective: He is  breathing better./  denies abdominal pain.    Objective: Vitals:   11/11/17 2318 11/12/17 0535 11/12/17 0740 11/12/17 0815  BP: (!) 136/92 133/86 140/81   Pulse: 63 (!) 59 (!) 57 77  Resp: 16 13 14 18   Temp: 98 F (36.7 C) 97.7 F (36.5 C) 97.8 F (36.6 C)   TempSrc: Oral Oral Oral   SpO2: 100%  100% 100%  Weight:  81.3 kg (179 lb 3.7 oz)    Height:        Intake/Output Summary (Last 24 hours) at 11/12/2017 1354 Last data filed at 11/12/2017 1008 Gross per 24 hour  Intake 990 ml  Output 2000 ml  Net -1010 ml   Filed Weights   11/10/17 0428 11/11/17 0500 11/12/17 0535  Weight: 83.5 kg (184 lb 1.4 oz) 83 kg (182 lb 15.7 oz) 81.3 kg (179 lb 3.7 oz)    Examination:  General exam: Appears calm and comfortable  Respiratory system: decreased breath sound on the left, crackles on the left.  Respiratory effort normal. Cardiovascular system: S1 & S2 heard, RRR. No JVD, murmurs, rubs, gallops or clicks. No pedal edema. Gastrointestinal system: Abdomen is nondistended, soft and nontender. No organomegaly or masses felt. Normal bowel sounds heard. Central nervous system: Alert and oriented. No focal neurological deficits. Extremities: Symmetric 5 x 5 power.   Data Reviewed: I have personally reviewed following labs and imaging studies  CBC: Recent Labs  Lab 11/07/17 0544 11/08/17 0505 11/09/17 0631  WBC 9.9 8.0 7.9  HGB 8.7* 7.6* 7.7*  HCT 27.6* 23.8* 24.4*  MCV 95.5 96.4 94.9  PLT 340 272 676   Basic Metabolic Panel: Recent Labs  Lab 11/06/17 1746 11/07/17 0544 11/07/17 1809 11/08/17 0505 11/08/17 1715 11/09/17 0631 11/10/17 0327 11/11/17 0630  NA  --  141  --  142  --  141 139 139  K  --  3.3*  --  3.3*  --  3.5 3.4* 3.5  CL  --  107  --  110  --  109 106 103  CO2  --  25  --  24  --  26 26 26   GLUCOSE  --  98  --  108*  --  101* 125* 99  BUN  --  11  --  12  --  10 16 17   CREATININE  --  1.04  --  0.91  --  0.86 0.80 0.96  CALCIUM  --  7.6*  --  7.7*  --   7.9* 7.9* 8.1*  MG 1.9 1.9 1.8 1.8 2.7*  --   --   --   PHOS 3.7 3.9 3.1 3.2 3.4  --   --   --    GFR: Estimated Creatinine Clearance: 95.3 mL/min (by C-G formula based on SCr of 0.96 mg/dL). Liver Function Tests: Recent Labs  Lab 11/07/17 0544 11/08/17 0505 11/09/17 0631  AST 14* 9* 10*  ALT 15* 12* 12*  ALKPHOS 124 86 116  BILITOT 0.7 0.6 0.6  PROT 5.1* 4.6* 5.0*  ALBUMIN 1.9* 1.8* 1.9*   No results for input(s): LIPASE, AMYLASE in the last 168 hours. No results for input(s): AMMONIA in the last 168 hours. Coagulation Profile: No results for input(s): INR, PROTIME in the last 168 hours. Cardiac Enzymes: No results for input(s): CKTOTAL, CKMB, CKMBINDEX, TROPONINI in the last 168 hours. BNP (last 3 results) No results for input(s): PROBNP in the last 8760 hours. HbA1C: No results for input(s): HGBA1C in the last 72 hours. CBG: Recent Labs  Lab 11/10/17 1936 11/11/17 0012 11/11/17 0441 11/11/17 0744 11/11/17 1129  GLUCAP 109* 113* 92 111* 166*   Lipid Profile: No results for input(s): CHOL, HDL, LDLCALC, TRIG, CHOLHDL, LDLDIRECT in the last 72 hours. Thyroid Function Tests: No results for input(s): TSH, T4TOTAL, FREET4, T3FREE, THYROIDAB in the last 72 hours. Anemia Panel: No results for input(s): VITAMINB12, FOLATE, FERRITIN, TIBC, IRON, RETICCTPCT in the last 72 hours. Sepsis Labs: Recent Labs  Lab 11/06/17 0334 11/07/17 0544  PROCALCITON <0.10 <0.10    Recent Results (from the past 240 hour(s))  Surgical PCR screen     Status: None   Collection Time: 11/03/17  9:13 AM  Result Value Ref Range Status   MRSA, PCR NEGATIVE NEGATIVE Final   Staphylococcus aureus NEGATIVE NEGATIVE Final    Comment: (NOTE) The Xpert SA Assay (FDA approved for NASAL specimens in patients 68 years of age and older), is one component of a comprehensive surveillance program. It is not intended to diagnose infection nor to guide or monitor treatment. Performed at [facility], [place] [address], [place], [place] [address]   Culture, respiratory (NON-Expectorated)     Status: None   Collection Time: 11/03/17 11:16 AM  Result Value Ref Range Status   Specimen Description SPUTUM BRONCHIAL SPUTUM NOT WASHING  Final   Special Requests SPEC A  Final   Gram Stain   Final    ABUNDANT WBC PRESENT, PREDOMINANTLY PMN NO SQUAMOUS EPITHELIAL CELLS SEEN NO ORGANISMS SEEN    Culture   Final    NO GROWTH 2 DAYS  Performed at [facility], [place] [address] [place], [place] [address]    Report Status 11/05/2017 FINAL  Final  Culture, body fluid-bottle     Status: None   Collection Time: 11/03/17 11:55 AM  Result Value Ref Range Status   Specimen Description PLEURAL LEFT  Final   Special Requests NONE  Final   Culture   Final    NO GROWTH 5 DAYS Performed at [facility] [address] [place], [place] [address]    Report Status 11/08/2017 FINAL  Final  Gram stain     Status: None   Collection Time: 11/03/17 11:55 AM  Result Value Ref Range Status   Specimen Description PLEURAL LEFT  Final   Special Requests NONE  Final   Gram Stain   Final    FEW WBC PRESENT, PREDOMINANTLY PMN NO ORGANISMS SEEN Performed at [facility], [place] [address] [place], [place] [address]    Report Status 11/03/2017 FINAL  Final  Aerobic/Anaerobic Culture (surgical/deep wound)     Status: None   Collection Time: 11/03/17 12:07 PM  Result Value Ref Range Status   Specimen Description TISSUE LEFT PLEURAL PEEL  Final   Special Requests SPEC C  Final   Gram Stain   Final    MODERATE WBC PRESENT,BOTH PMN AND MONONUCLEAR NO ORGANISMS SEEN    Culture   Final    No growth aerobically or anaerobically. Performed at [facility], [place] [address], [place], [place] [address]    Report Status 11/08/2017 FINAL  Final  Culture, respiratory (NON-Expectorated)     Status: None   Collection Time: 11/07/17 10:12 AM  Result Value Ref Range Status   Specimen  Description TRACHEAL ASPIRATE  Final   Special Requests NONE  Final   Gram Stain   Final    RARE WBC PRESENT, PREDOMINANTLY PMN NO ORGANISMS SEEN Performed at [facility], [place] [address], [place],  [address]    Culture RARE CANDIDA TROPICALIS  Final   Report Status 11/11/2017 FINAL  Final         Radiology Studies: Dg Chest Port 1 View  Result Date: 11/12/2017 CLINICAL DATA:  Pneumonia EXAM: PORTABLE CHEST 1 VIEW COMPARISON:  11/11/2017 FINDINGS: Improved aeration in the left lung base with decrease in airspace disease and small left effusion. Improved aeration in the right lung base which now clear. Central venous catheter has been removed. IMPRESSION: Interval improvement in aeration of the lung bases. Persistent left lower lobe airspace disease and left pleural effusion with improvement since the prior study. Electronically Signed   By: Franchot Gallo M.D.   On: 11/12/2017 07:58   Dg Chest Port 1 View  Result Date: 11/11/2017 CLINICAL DATA:  Atelectasis. EXAM: PORTABLE CHEST 1 VIEW COMPARISON:  Radiograph of November 10, 2017. FINDINGS: Stable cardiomediastinal silhouette. No pneumothorax is noted. Endotracheal and nasogastric tubes have been removed. Left internal jugular catheter is unchanged in position. No pneumothorax is noted. Minimal right basilar subsegmental atelectasis is noted. Stable left basilar atelectasis or infiltrate is noted with small left pleural effusion. Left pleural drainage catheter is not visualized. Bony thorax is unremarkable. IMPRESSION: Stable left basilar atelectasis or infiltrate with small left pleural effusion. Left pleural drainage catheter is no longer visualized. Endotracheal and nasogastric tubes have been removed. Electronically Signed   By: Marijo Conception, M.D.   On: 11/11/2017 07:22        Scheduled Meds: . amoxicillin-clavulanate  1  tablet Oral Q12H  . heparin injection (subcutaneous)  5,000 Units Subcutaneous Q8H  .  ipratropium-albuterol  3 mL Nebulization TID  . nutrition supplement (JUVEN)  1 packet Oral BID BM  . saccharomyces boulardii  250 mg Oral BID  . thiamine injection  100 mg Intravenous Daily   Continuous Infusions:   LOS: 12 days    Time spent: 35 minutes.     Elmarie Shiley, MD [facility] Pager [phone]  If 7PM-7AM, please contact night-coverage [email or web address] Password [email or web address] 11/12/2017, 1:54 PM

## 2017-11-12 NOTE — Progress Notes (Signed)
9 Days Post-Op Procedure(s) (LRB): FLEXIBLE BRONCHOSCOPY (Left) VIDEO ASSISTED THORACOSCOPY (VATS) FOR DRAINAGE OF PLEURAL EFFUSION (Left) Subjective: Some incisional tenderness, no problems with breathing  Objective: Vital signs in last 24 hours: Temp:  [97.7 F (36.5 C)-98.8 F (37.1 C)] 97.8 F (36.6 C) (04/16 0740) Pulse Rate:  [57-81] 57 (04/16 0740) Cardiac Rhythm: Normal sinus rhythm (04/15 2100) Resp:  [12-18] 14 (04/16 0740) BP: (113-140)/(70-98) 140/81 (04/16 0740) SpO2:  [94 %-100 %] 100 % (04/16 0740) Weight:  [179 lb 3.7 oz (81.3 kg)] 179 lb 3.7 oz (81.3 kg) (04/16 0535)  Hemodynamic parameters for last 24 hours:    Intake/Output from previous day: 04/15 0701 - 04/16 0700 In: 1150 [P.O.:1150] Out: 1900 [Urine:1150; Stool:750] Intake/Output this shift: No intake/output data recorded.  General appearance: alert, cooperative and no distress Heart: regular rate and rhythm Lungs: diminished breath sounds left base Wound: clean and dry  Lab Results: No results for input(s): WBC, HGB, HCT, PLT in the last 72 hours. BMET:  Recent Labs    11/10/17 0327 11/11/17 0630  NA 139 139  K 3.4* 3.5  CL 106 103  CO2 26 26  GLUCOSE 125* 99  BUN 16 17  CREATININE 0.80 0.96  CALCIUM 7.9* 8.1*    PT/INR: No results for input(s): LABPROT, INR in the last 72 hours. ABG    Component Value Date/Time   PHART 7.474 (H) 11/09/2017 0250   HCO3 27.4 11/09/2017 0250   TCO2 41 09/28/2016 0957   ACIDBASEDEF 0.1 11/04/2017 0553   O2SAT 97.4 11/09/2017 0250   CBG (last 3)  Recent Labs    11/11/17 0441 11/11/17 0744 11/11/17 1129  GLUCAP 92 111* 166*    Assessment/Plan: S/P Procedure(s) (LRB): FLEXIBLE BRONCHOSCOPY (Left) VIDEO ASSISTED THORACOSCOPY (VATS) FOR DRAINAGE OF PLEURAL EFFUSION (Left) -Left lower lobe pneumonia with parapneumonic effusion- s/p decortication Chest tubes are out CXR looks good Cultures from OR no growth Candida tropicalis in sputum On PO  Augmentin Doing well from a surgical perspective   LOS: 12 days    Melrose Nakayama 11/12/2017

## 2017-11-12 NOTE — Progress Notes (Signed)
Patient refused tx at this time, no distress noted RCP will continue to monitor.

## 2017-11-13 LAB — CBC
HEMATOCRIT: 33.6 % — AB (ref 39.0–52.0)
Hemoglobin: 10.4 g/dL — ABNORMAL LOW (ref 13.0–17.0)
MCH: 29.7 pg (ref 26.0–34.0)
MCHC: 31 g/dL (ref 30.0–36.0)
MCV: 96 fL (ref 78.0–100.0)
Platelets: 312 10*3/uL (ref 150–400)
RBC: 3.5 MIL/uL — ABNORMAL LOW (ref 4.22–5.81)
RDW: 15.2 % (ref 11.5–15.5)
WBC: 7.7 10*3/uL (ref 4.0–10.5)

## 2017-11-13 LAB — BASIC METABOLIC PANEL
ANION GAP: 8 (ref 5–15)
BUN: 20 mg/dL (ref 6–20)
CALCIUM: 8.6 mg/dL — AB (ref 8.9–10.3)
CO2: 26 mmol/L (ref 22–32)
CREATININE: 0.97 mg/dL (ref 0.61–1.24)
Chloride: 102 mmol/L (ref 101–111)
GFR calc Af Amer: >60 mL/min (ref >60)
GLUCOSE: 111 mg/dL — AB (ref 65–99)
Potassium: 4.2 mmol/L (ref 3.5–5.1)
Sodium: 136 mmol/L (ref 135–145)

## 2017-11-13 NOTE — Evaluation (Signed)
Physical Therapy Evaluation Patient Details Name: Dan Salazar MRN: [id number] DOB: [birth year] Today's Date: 11/13/2017   History of Present Illness  57-year-old male with history of CKD stage I, essential hypertension was admitted to the hospital for aspiration pneumonia complicated by left-sided pleural effusion and septic shock.  He underwent VATS decortication on November 03, 628 and later complicated by severe atelectasis and lung collapse.  He is also gone bronchoscopy twice for clearing of his secretions and plugging.  Today he is doing much better.  Clinical Impression  Pt admitted with above diagnosis. Pt currently with functional limitations due to the deficits listed below (see PT Problem List). Pt was able to ambulate with rollator with good overall safety. Pt liked the rollator as he could rest when he wanted.  Sats >94% throughout therefore no need for O2. Nurse aware.  Will continue PT as pt tolerates.  Pt will benefit from skilled PT to increase their independence and safety with mobility to allow discharge to the venue listed below.     Follow Up Recommendations Home health PT;Supervision/Assistance - 24 hour(pt plans for daughter to be there initially)    Equipment Recommendations  (rollator)    Recommendations for Other Services       Precautions / Restrictions Precautions Precautions: Fall Restrictions Weight Bearing Restrictions: No      Mobility  Bed Mobility Overal bed mobility: Independent                Transfers Overall transfer level: Needs assistance   Transfers: Sit to/from Stand Sit to Stand: Min guard         General transfer comment: slightly unsteady provided guard assist  Ambulation/Gait Ambulation/Gait assistance: Supervision;Min guard Ambulation Distance (Feet): 280 Feet Assistive device: 4-wheeled walker Gait Pattern/deviations: Step-through pattern;Decreased stride length;Trunk flexed   Gait velocity interpretation: 1.31 - 2.62  ft/sec, indicative of limited community ambulator General Gait Details: Pt did well with rollator.  Standing better the more he ambulated.  Sats >94% throughout therefore left O2 off after discussion with nursing. Pt able to lock and unlock brakes and sit and rest prn.   Stairs            Wheelchair Mobility    Modified Rankin (Stroke Patients Only)       Balance Overall balance assessment: Needs assistance Sitting-balance support: No upper extremity supported;Feet supported Sitting balance-Leahy Scale: Good     Standing balance support: Bilateral upper extremity supported;During functional activity Standing balance-Leahy Scale: Poor Standing balance comment: Relies on UE support for balance and slightly flexes posture as well .                             Pertinent Vitals/Pain Pain Assessment: No/denies pain    Home Living Family/patient expects to be discharged to:: Private residence Living Arrangements: Alone Available Help at Discharge: Available PRN/intermittently(daughter works and has infant) Type of Home: House Home Access: Stairs to enter Entrance Stairs-Rails: Right;Left;Can reach both Technical brewer of Steps: 5 Home Layout: One level Home Equipment: Grab bars - tub/shower      Prior Function Level of Independence: Independent               Hand Dominance   Dominant Hand: Right    Extremity/Trunk Assessment   Upper Extremity Assessment Upper Extremity Assessment: Defer to OT evaluation    Lower Extremity Assessment Lower Extremity Assessment: Generalized weakness    Cervical / Trunk Assessment Cervical /  Trunk Assessment: Kyphotic  Communication   Communication: No difficulties  Cognition Arousal/Alertness: Awake/alert Behavior During Therapy: WFL for tasks assessed/performed Overall Cognitive Status: Within Functional Limits for tasks assessed                                        General  Comments      Exercises     Assessment/Plan    PT Assessment Patient needs continued PT services  PT Problem List Decreased activity tolerance;Decreased balance;Decreased mobility;Decreased knowledge of use of DME;Decreased safety awareness;Decreased knowledge of precautions       PT Treatment Interventions DME instruction;Gait training;Functional mobility training;Therapeutic activities;Therapeutic exercise;Balance training;Patient/family education;Stair training    PT Goals (Current goals can be found in the Care Plan section)  Acute Rehab PT Goals Patient Stated Goal: to get better PT Goal Formulation: With patient Time For Goal Achievement: 11/27/17 Potential to Achieve Goals: Good    Frequency Min 3X/week   Barriers to discharge Decreased caregiver support      Co-evaluation               AM-PAC PT "6 Clicks" Daily Activity  Outcome Measure Difficulty turning over in bed (including adjusting bedclothes, sheets and blankets)?: None Difficulty moving from lying on back to sitting on the side of the bed? : None Difficulty sitting down on and standing up from a chair with arms (e.g., wheelchair, bedside commode, etc,.)?: A Little Help needed moving to and from a bed to chair (including a wheelchair)?: A Little Help needed walking in hospital room?: A Little Help needed climbing 3-5 steps with a railing? : A Lot 6 Click Score: 19    End of Session Equipment Utilized During Treatment: Gait belt Activity Tolerance: Patient limited by fatigue Patient left: in chair;with call bell/phone within reach;with chair alarm set Nurse Communication: Mobility status PT Visit Diagnosis: Unsteadiness on feet (R26.81);Muscle weakness (generalized) (M62.81)    Time: 1030-1053 PT Time Calculation (min) (ACUTE ONLY): 23 min   Charges:   PT Evaluation $PT Eval Moderate Complexity: 1 Mod PT Treatments $Gait Training: 8-22 mins   PT G Codes:        Malaak Stach,PT Acute  Rehabilitation [phone] (pager)   Denice Paradise 11/13/2017, 2:15 PM

## 2017-11-13 NOTE — Evaluation (Signed)
Occupational Therapy Evaluation Patient Details Name: Dan Salazar MRN: [id number] DOB: Jul 15, 1961 Today's Date: 11/13/2017    History of Present Illness 57 year old male with history of CKD stage I, essential hypertension was admitted to the hospital for aspiration pneumonia complicated by left-sided pleural effusion and septic shock.  He underwent VATS decortication on November 03, 3293 and later complicated by severe atelectasis and lung collapse.  He is also undergone bronchoscopy twice for clearing of his secretions and plugging.     Clinical Impression   Pt was independent prior to admission. Pt currently requires supervision for ADL and use of DME. Pt with decreased activity tolerance. Will follow acutely. Pt will have intermittent assist of his daughter upon discharge.     Follow Up Recommendations  No OT follow up    Equipment Recommendations  3 in 1 bedside commode    Recommendations for Other Services       Precautions / Restrictions Precautions Precautions: Fall Restrictions Weight Bearing Restrictions: No      Mobility Bed Mobility Overal bed mobility: Independent                Transfers Overall transfer level: Needs assistance Equipment used: None;Rolling walker (2 wheeled) Transfers: Sit to/from [employer] Transfers Sit to Stand: Supervision Stand pivot transfers: Supervision       General transfer comment: mild unsteadiness    Balance Overall balance assessment: Needs assistance Sitting-balance support: No upper extremity supported;Feet supported Sitting balance-Leahy Scale: Good     Standing balance support: Bilateral upper extremity supported;During functional activity Standing balance-Leahy Scale: Fair Standing balance comment: statically                           ADL either performed or assessed with clinical judgement   ADL Overall ADL's : Needs assistance/impaired Eating/Feeding: Independent;Sitting   Grooming:  Wash/dry hands;Standing;Supervision/safety   Upper Body Bathing: Set up;Sitting   Lower Body Bathing: Supervison/ safety;Sit to/from stand   Upper Body Dressing : Set up;Sitting   Lower Body Dressing: Supervision/safety;Sit to/from stand   Toilet Transfer: Supervision/safety;Stand-pivot;BSC   Toileting- Water quality scientist and Hygiene: Supervision/safety;Sit to/from stand       Functional mobility during ADLs: Supervision/safety;Rolling walker       Vision Baseline Vision/History: No visual deficits       Perception     Praxis      Pertinent Vitals/Pain Pain Assessment: No/denies pain     Hand Dominance Right   Extremity/Trunk Assessment Upper Extremity Assessment Upper Extremity Assessment: Overall WFL for tasks assessed   Lower Extremity Assessment Lower Extremity Assessment: Defer to PT evaluation   Cervical / Trunk Assessment Cervical / Trunk Assessment: Kyphotic   Communication Communication Communication: No difficulties   Cognition Arousal/Alertness: Awake/alert Behavior During Therapy: WFL for tasks assessed/performed Overall Cognitive Status: Within Functional Limits for tasks assessed                                     General Comments       Exercises     Shoulder Instructions      Home Living Family/patient expects to be discharged to:: Private residence Living Arrangements: Alone Available Help at Discharge: Available PRN/intermittently Type of Home: House Home Access: Stairs to enter [employer] of Steps: 5 Entrance Stairs-Rails: Right;Left;Can reach both Home Layout: One level     Bathroom Shower/Tub: Tub/shower unit   [employer]  Toilet: Handicapped height     Home Equipment: Grab bars - tub/shower          Prior Functioning/Environment Level of Independence: Independent                 OT Problem List: Decreased activity tolerance;Impaired balance (sitting and/or standing);Decreased  knowledge of use of DME or AE;Cardiopulmonary status limiting activity      OT Treatment/Interventions: Self-care/ADL training;Energy conservation;DME and/or AE instruction;Patient/family education;Balance training;Therapeutic activities    OT Goals(Current goals can be found in the care plan section) Acute Rehab OT Goals Patient Stated Goal: to get better OT Goal Formulation: With patient Time For Goal Achievement: 11/27/17 Potential to Achieve Goals: Good ADL Goals Pt Will Perform Grooming: with modified independence;standing Pt Will Perform Lower Body Bathing: with modified independence;sit to/from stand Pt Will Perform Lower Body Dressing: with modified independence;sit to/from stand Pt Will Transfer to Toilet: with modified independence;ambulating;regular height toilet Pt Will Perform Toileting - Clothing Manipulation and hygiene: with modified independence;sit to/from stand Pt Will Perform Tub/Shower Transfer: Tub transfer;with modified independence;3 in 1;rolling walker Additional ADL Goal #1: Pt will generalize energy conservation strategies in ADL and mobility independently.  OT Frequency: Min 2X/week   Barriers to D/C:            Co-evaluation              AM-PAC PT "6 Clicks" Daily Activity     Outcome Measure Help from another person eating meals?: None Help from another person taking care of personal grooming?: A Little Help from another person toileting, which includes using toliet, bedpan, or urinal?: A Little Help from another person bathing (including washing, rinsing, drying)?: A Little Help from another person to put on and taking off regular upper body clothing?: None Help from another person to put on and taking off regular lower body clothing?: A Little 6 Click Score: 20   End of Session Equipment Utilized During Treatment: Gait belt;Rolling walker  Activity Tolerance: Patient tolerated treatment well Patient left: in bed;with call bell/phone within  reach  OT Visit Diagnosis: Unsteadiness on feet (R26.81)                Time: 5621-3086 OT Time Calculation (min): 11 min Charges:  OT General Charges $OT Visit: 1 Visit OT Evaluation $OT Eval Low Complexity: 1 Low $OT Eval Moderate Complexity: 1 Mod G-Codes:     2017-11-19 Nestor Lewandowsky, OTR/L Pager: [phone]  Werner Lean Haze Boyden November 19, 2017, 2:51 PM

## 2017-11-13 NOTE — Progress Notes (Signed)
PROGRESS NOTE    Dan Salazar  JWJ:[id number] DOB: August 22, 1960 DOA: 10/31/2017 PCP: Mardi Mainland, FNP   Brief Narrative:  57 year old male with history of CKD stage I, essential hypertension was admitted to the hospital for aspiration pneumonia complicated by left-sided pleural effusion and septic shock.  He underwent VATS decortication on November 04, 6211 and later complicated by severe atelectasis and lung collapse.  He is also gone bronchoscopy twice for clearing of his secretions and plugging.  Today he is doing much better.   Assessment & Plan:   Principal Problem:   Pleural effusion on left Active Problems:   Hyponatremia   Tachycardia   Pneumonia   Atelectasis   Difficult intravenous access   Encounter for intubation   Acute respiratory failure with hypoxia (HCC)   Acute hypoxic respiratory failure secondary to aspiration pneumonia Complicated loculated left-sided pleural effusion and empyema status post video-assisted thoracotomy on November 03, 2017 Post operative atelectasis - Initially patient was treated with IV Zosyn and vancomycin.  Currently transitioned to Augmentin, last day today -Underwent bronchoscopy on November 07, 2017 to remove secretions -Provide supportive care, nebulizer treatments  Dysphagia -Evaluate by speech therapy recommended regular diet  Memory problems -Low B12 level therefore started on supplements -TSH is normal  CKD stage I-2 -Avoid nephrotoxic drugs.  Follow-up outpatient with primary care physician   DVT prophylaxis: Heparin Code Status: Full code Family Communication: None at bedside Disposition Plan: We will discharge when cleared by cardiothoracic surgery.  Consultants:   Pulmonary  Cardiothoracic surgery  Procedures:   Intubation November 05, 2017  Bronchoscopy November 05, 2017  Central line placement November 05, 2017  Extubation November 06, 2017  Repeat bronchoscopy November 07, 2017  Video-assisted thoracotomy on November 03, 2017  Antimicrobials:   Vancomycin Zosyn-now stop  Augmentin   Subjective: Feeling much better, denies any complaint.  Weaned off oxygen.  Review of Systems Otherwise negative except as per HPI, including: General: Denies fever, chills, night sweats or unintended weight loss. Resp: Denies cough, wheezing, shortness of breath. Cardiac: Denies chest pain, palpitations, orthopnea, paroxysmal nocturnal dyspnea. GI: Denies abdominal pain, nausea, vomiting, diarrhea or constipation GU: Denies dysuria, frequency, hesitancy or incontinence MS: Denies muscle aches, joint pain or swelling Neuro: Denies headache, neurologic deficits (focal weakness, numbness, tingling), abnormal gait Psych: Denies anxiety, depression, SI/HI/AVH Skin: Denies new rashes or lesions ID: Denies sick contacts, exotic exposures, travel  Objective: Vitals:   11/13/17 0817 11/13/17 0830 11/13/17 0920 11/13/17 1205  BP: 134/86   (!) 152/80  Pulse: 81 93  78  Resp: 14 15  (!) 23  Temp: 97.7 F (36.5 C)   98.4 F (36.9 C)  TempSrc: Axillary   Oral  SpO2: 98% 99% 99% 99%  Weight:      Height:        Intake/Output Summary (Last 24 hours) at 11/13/2017 1249 Last data filed at 11/13/2017 1000 Gross per 24 hour  Intake 780 ml  Output 1425 ml  Net -645 ml   Filed Weights   11/11/17 0500 11/12/17 0535 11/12/17 2352  Weight: 83 kg (182 lb 15.7 oz) 81.3 kg (179 lb 3.7 oz) 80 kg (176 lb 5.9 oz)    Examination:  General exam: Appears calm and comfortable  Respiratory system: Slight bibasilar crackles Cardiovascular system: S1 & S2 heard, RRR. No JVD, murmurs, rubs, gallops or clicks. No pedal edema. Gastrointestinal system: Abdomen is nondistended, soft and nontender. No organomegaly or masses felt. Normal bowel sounds heard. Central  nervous system: Alert and oriented. No focal neurological deficits. Extremities: Symmetric 5 x 5 power. Skin: No rashes, lesions or ulcers Psychiatry: Judgement and insight  appear normal. Mood & affect appropriate.     Data Reviewed:   CBC: Recent Labs  Lab 11/07/17 0544 11/08/17 0505 11/09/17 0631 11/12/17 1418 11/13/17 0309  WBC 9.9 8.0 7.9 8.2 7.7  HGB 8.7* 7.6* 7.7* 10.1* 10.4*  HCT 27.6* 23.8* 24.4* 32.4* 33.6*  MCV 95.5 96.4 94.9 96.1 96.0  PLT 340 272 308 351 606   Basic Metabolic Panel: Recent Labs  Lab 11/06/17 1746  11/07/17 0544 11/07/17 1809 11/08/17 0505 11/08/17 1715 11/09/17 0631 11/10/17 0327 11/11/17 0630 11/13/17 0309  NA  --    < > 141  --  142  --  141 139 139 136  K  --    < > 3.3*  --  3.3*  --  3.5 3.4* 3.5 4.2  CL  --    < > 107  --  110  --  109 106 103 102  CO2  --    < > 25  --  24  --  26 26 26 26   GLUCOSE  --    < > 98  --  108*  --  101* 125* 99 111*  BUN  --    < > 11  --  12  --  10 16 17 20   CREATININE  --    < > 1.04  --  0.91  --  0.86 0.80 0.96 0.97  CALCIUM  --    < > 7.6*  --  7.7*  --  7.9* 7.9* 8.1* 8.6*  MG 1.9  --  1.9 1.8 1.8 2.7*  --   --   --   --   PHOS 3.7  --  3.9 3.1 3.2 3.4  --   --   --   --    < > = values in this interval not displayed.   GFR: Estimated Creatinine Clearance: 92.8 mL/min (by C-G formula based on SCr of 0.97 mg/dL). Liver Function Tests: Recent Labs  Lab 11/07/17 0544 11/08/17 0505 11/09/17 0631  AST 14* 9* 10*  ALT 15* 12* 12*  ALKPHOS 124 86 116  BILITOT 0.7 0.6 0.6  PROT 5.1* 4.6* 5.0*  ALBUMIN 1.9* 1.8* 1.9*   No results for input(s): LIPASE, AMYLASE in the last 168 hours. No results for input(s): AMMONIA in the last 168 hours. Coagulation Profile: No results for input(s): INR, PROTIME in the last 168 hours. Cardiac Enzymes: No results for input(s): CKTOTAL, CKMB, CKMBINDEX, TROPONINI in the last 168 hours. BNP (last 3 results) No results for input(s): PROBNP in the last 8760 hours. HbA1C: No results for input(s): HGBA1C in the last 72 hours. CBG: Recent Labs  Lab 11/10/17 1936 11/11/17 0012 11/11/17 0441 11/11/17 0744 11/11/17 1129    GLUCAP 109* 113* 92 111* 166*   Lipid Profile: No results for input(s): CHOL, HDL, LDLCALC, TRIG, CHOLHDL, LDLDIRECT in the last 72 hours. Thyroid Function Tests: No results for input(s): TSH, T4TOTAL, FREET4, T3FREE, THYROIDAB in the last 72 hours. Anemia Panel: No results for input(s): VITAMINB12, FOLATE, FERRITIN, TIBC, IRON, RETICCTPCT in the last 72 hours. Sepsis Labs: Recent Labs  Lab 11/07/17 0544  PROCALCITON <0.10    Recent Results (from the past 240 hour(s))  Culture, respiratory (NON-Expectorated)     Status: None   Collection Time: 11/07/17 10:12 AM  Result Value Ref Range Status  Specimen Description TRACHEAL ASPIRATE  Final   Special Requests NONE  Final   Gram Stain   Final    RARE WBC PRESENT, PREDOMINANTLY PMN NO ORGANISMS SEEN Performed at [facility], [place] [address], [place], [place] [address]    Culture RARE CANDIDA TROPICALIS  Final   Report Status 11/11/2017 FINAL  Final         Radiology Studies: Dg Chest Port 1 View  Result Date: 11/12/2017 CLINICAL DATA:  Pneumonia EXAM: PORTABLE CHEST 1 VIEW COMPARISON:  11/11/2017 FINDINGS: Improved aeration in the left lung base with decrease in airspace disease and small left effusion. Improved aeration in the right lung base which now clear. Central venous catheter has been removed. IMPRESSION: Interval improvement in aeration of the lung bases. Persistent left lower lobe airspace disease and left pleural effusion with improvement since the prior study. Electronically Signed   By: Franchot Gallo M.D.   On: 11/12/2017 07:58        Scheduled Meds: . amoxicillin-clavulanate  1 tablet Oral Q12H  . cyanocobalamin  100 mcg Intramuscular Daily  . heparin injection (subcutaneous)  5,000 Units Subcutaneous Q8H  . ipratropium-albuterol  3 mL Nebulization TID  . nutrition supplement (JUVEN)  1 packet Oral BID BM  . saccharomyces boulardii  250 mg Oral BID  . thiamine injection  100 mg Intravenous Daily    Continuous Infusions:   LOS: 13 days    Time spent: 32 minutes    Ankit Arsenio Loader, MD [facility] Pager [phone]   If 7PM-7AM, please contact night-coverage [email or web address] Password [facility] 11/13/2017, 12:49 PM

## 2017-11-13 NOTE — Progress Notes (Addendum)
      [address]       [place] [address]             [phone]      10 Days Post-Op Procedure(s) (LRB): FLEXIBLE BRONCHOSCOPY (Left) VIDEO ASSISTED THORACOSCOPY (VATS) FOR DRAINAGE OF PLEURAL EFFUSION (Left) Subjective: No new CXR Denies SOB, only mild incis discomfort  Objective: Vital signs in last 24 hours: Temp:  [97.7 F (36.5 C)-98.4 F (36.9 C)] 97.7 F (36.5 C) (04/17 0817) Pulse Rate:  [61-81] 81 (04/17 0817) Cardiac Rhythm: Normal sinus rhythm (04/17 0752) Resp:  [14-22] 14 (04/17 0817) BP: (122-134)/(81-86) 134/86 (04/17 0817) SpO2:  [98 %-100 %] 99 % (04/17 0920) Weight:  [80 kg (176 lb 5.9 oz)] 80 kg (176 lb 5.9 oz) (04/16 2352)  Hemodynamic parameters for last 24 hours:    Intake/Output from previous day: 04/16 0701 - 04/17 0700 In: 540 [P.O.:540] Out: 2075 [Urine:2075] Intake/Output this shift: No intake/output data recorded.  General appearance: alert, cooperative and no distress Heart: regular rate and rhythm Lungs: fair air exchange throughout Abdomen: benign Extremities: PAS in place, no calf tenderness Wound: incis healing well  Lab Results: Recent Labs    11/12/17 1418 11/13/17 0309  WBC 8.2 7.7  HGB 10.1* 10.4*  HCT 32.4* 33.6*  PLT 351 312   BMET:  Recent Labs    11/11/17 0630 11/13/17 0309  NA 139 136  K 3.5 4.2  CL 103 102  CO2 26 26  GLUCOSE 99 111*  BUN 17 20  CREATININE 0.96 0.97  CALCIUM 8.1* 8.6*    PT/INR: No results for input(s): LABPROT, INR in the last 72 hours. ABG    Component Value Date/Time   PHART 7.474 (H) 11/09/2017 0250   HCO3 27.4 11/09/2017 0250   TCO2 41 09/28/2016 0957   ACIDBASEDEF 0.1 11/04/2017 0553   O2SAT 97.4 11/09/2017 0250   CBG (last 3)  Recent Labs    11/11/17 0441 11/11/17 0744 11/11/17 1129  GLUCAP 92 111* 166*    Meds Scheduled Meds: . amoxicillin-clavulanate  1 tablet Oral Q12H  . cyanocobalamin  100 mcg Intramuscular Daily  . heparin injection  (subcutaneous)  5,000 Units Subcutaneous Q8H  . ipratropium-albuterol  3 mL Nebulization TID  . nutrition supplement (JUVEN)  1 packet Oral BID BM  . saccharomyces boulardii  250 mg Oral BID  . thiamine injection  100 mg Intravenous Daily   Continuous Infusions: PRN Meds:.bisacodyl, fentaNYL (SUBLIMAZE) injection, sodium chloride flush, traMADol  Xrays Dg Chest Port 1 View  Result Date: 11/12/2017 CLINICAL DATA:  Pneumonia EXAM: PORTABLE CHEST 1 VIEW COMPARISON:  11/11/2017 FINDINGS: Improved aeration in the left lung base with decrease in airspace disease and small left effusion. Improved aeration in the right lung base which now clear. Central venous catheter has been removed. IMPRESSION: Interval improvement in aeration of the lung bases. Persistent left lower lobe airspace disease and left pleural effusion with improvement since the prior study. Electronically Signed   By: Franchot Gallo M.D.   On: 11/12/2017 07:58    Assessment/Plan: S/P Procedure(s) (LRB): FLEXIBLE BRONCHOSCOPY (Left) VIDEO ASSISTED THORACOSCOPY (VATS) FOR DRAINAGE OF PLEURAL EFFUSION (Left)  1 conts to progress well 2 labs stable, no new CXR 3 cont rehab/pulm toilet/RX 4 management as per PCCM/Medicine svc   LOS: 13 days    Dan Salazar 11/13/2017 Patient seen and examined, agree with above  Remo Lipps C. Roxan Hockey, MD [facility] [phone]

## 2017-11-14 MED ORDER — VITAMIN B-1 100 MG PO TABS: 100.0000 mg | ORAL_TABLET | Freq: Every day | ORAL | 0 refills | Status: DC

## 2017-11-14 MED ORDER — SACCHAROMYCES BOULARDII 250 MG PO CAPS: 250.0000 mg | ORAL_CAPSULE | Freq: Two times a day (BID) | ORAL | 0 refills | Status: AC

## 2017-11-14 MED ORDER — TRAMADOL HCL 50 MG PO TABS: 50.0000 mg | ORAL_TABLET | Freq: Two times a day (BID) | ORAL | 0 refills | Status: DC | PRN

## 2017-11-14 MED ORDER — IPRATROPIUM-ALBUTEROL 0.5-2.5 (3) MG/3ML IN SOLN: 3.0000 mL | Freq: Four times a day (QID) | RESPIRATORY_TRACT | 0 refills | Status: DC | PRN

## 2017-11-14 MED ORDER — IPRATROPIUM-ALBUTEROL 0.5-2.5 (3) MG/3ML IN SOLN
3.0000 mL | Freq: Two times a day (BID) | RESPIRATORY_TRACT | Status: DC
  Administered 2017-11-14: 3 mL via RESPIRATORY_TRACT
  Filled 2017-11-14: qty 3

## 2017-11-14 MED ORDER — VITAMIN B-12 100 MCG PO TABS: 100.0000 ug | ORAL_TABLET | Freq: Every day | ORAL | 3 refills | Status: AC

## 2017-11-14 MED ORDER — AMOXICILLIN-POT CLAVULANATE 875-125 MG PO TABS: 1.0000 | ORAL_TABLET | Freq: Two times a day (BID) | ORAL | 0 refills | Status: AC

## 2017-11-14 MED ORDER — VITAMIN B-1 100 MG PO TABS: 100.0000 mg | ORAL_TABLET | Freq: Every day | ORAL | Status: DC

## 2017-11-14 NOTE — Progress Notes (Signed)
PHARMACIST - PHYSICIAN COMMUNICATION  DR:   Reesa Chew  CONCERNING: IV to Oral Route Change Policy  RECOMMENDATION: This patient is receiving thiamine by the intravenous route.  Based on criteria approved by the Pharmacy and Therapeutics Committee, the intravenous medication(s) is/are being converted to the equivalent oral dose form(s).   DESCRIPTION: These criteria include:  The patient is eating (either orally or via tube) and/or has been taking other orally administered medications for a least 24 hours  The patient has no evidence of active gastrointestinal bleeding or impaired GI absorption (gastrectomy, short bowel, patient on TNA or NPO).  If you have questions about this conversion, please contact the Pharmacy Department  []   [phone] )  Dan Salazar []   [phone] )  Dan Salazar [x]   [phone] )  Dan Salazar []   [phone] )  Dan Salazar []   [phone] )  Dan Salazar, [facility], [facility] 11/14/2017 10:20 AM

## 2017-11-14 NOTE — Progress Notes (Signed)
Occupational Therapy Treatment Patient Details Name: Dan Salazar MRN: [id number] DOB: 09-Oct-1960 Today's Date: 11/14/2017    History of present illness 57 year old male with history of CKD stage I, essential hypertension was admitted to the hospital for aspiration pneumonia complicated by left-sided pleural effusion and septic shock.  He underwent VATS decortication on November 04, 5327 and later complicated by severe atelectasis and lung collapse.  He is also undergone bronchoscopy twice for clearing of his secretions and plugging.     OT comments  Pt used 3:1 due to urgency; ambulated to bathroom after this to practice toilet transfer and demonstrate tub transfer. Pt's HR up to 120s with each activity. Educated on energy conservation; will need reinforcement.  Pt asked about going home several times and also asked if I could find MD several times.  Did not seem to hold onto this information    Follow Up Recommendations  No OT follow up    Equipment Recommendations  3 in 1 bedside commode    Recommendations for Other Services      Precautions / Restrictions Precautions Precautions: Fall Restrictions Weight Bearing Restrictions: No       Mobility Bed Mobility Overal bed mobility: Independent                Transfers   Equipment used: Rolling walker (2 wheeled)   Sit to Stand: Supervision Stand pivot transfers: Supervision       General transfer comment: cues for lines/safety    Balance             Standing balance-Leahy Scale: Fair Standing balance comment: during toilet hygiene                           ADL either performed or assessed with clinical judgement   ADL       Grooming: Wash/dry hands;Standing;Supervision/safety                   Toilet Transfer: Supervision/safety;Stand-pivot;BSC   Toileting- Water quality scientist and Hygiene: Supervision/safety;Sit to/from stand         General ADL Comments: pt used BSC then  ambulated to bathroom to practice toilet and demonstrate tub transfer. Pt's HR up to 124 with activity. Gave him several rest sitting rest breaks and educated on energy conservation     Vision       Perception     Praxis      Cognition Arousal/Alertness: Awake/alert Behavior During Therapy: WFL for tasks assessed/performed Overall Cognitive Status: No family/caregiver present to determine baseline cognitive functioning                                 General Comments: asked the same things repeatedly        Exercises     Shoulder Instructions       General Comments      Pertinent Vitals/ Pain       Pain Assessment: No/denies pain  Home Living                                          Prior Functioning/Environment              Frequency  Min 2X/week        Progress Toward Goals  OT Goals(current goals can  now be found in the care plan section)  Progress towards OT goals: Progressing toward goals     Plan      Co-evaluation                 AM-PAC PT "6 Clicks" Daily Activity     Outcome Measure   Help from another person eating meals?: None Help from another person taking care of personal grooming?: A Little Help from another person toileting, which includes using toliet, bedpan, or urinal?: A Little Help from another person bathing (including washing, rinsing, drying)?: A Little Help from another person to put on and taking off regular upper body clothing?: A Little Help from another person to put on and taking off regular lower body clothing?: A Little 6 Click Score: 19    End of Session    OT Visit Diagnosis: Unsteadiness on feet (R26.81)   Activity Tolerance Patient tolerated treatment well   Patient Left in bed;with call bell/phone within reach;with bed alarm set   Nurse Communication          Time: 9735-3299 OT Time Calculation (min): 45 min  Charges: OT General Charges $OT Visit: 1  Visit OT Treatments $Self Care/Home Management : 23-37 mins $Therapeutic Activity: 8-22 mins    Elianna Windom 11/14/2017, 10:19 AM [phone]

## 2017-11-14 NOTE — Discharge Instructions (Signed)
Thoracoscopy, Care After °Refer to this sheet in the next few weeks. These instructions provide you with information about caring for yourself after your procedure. Your health care provider may also give you more specific instructions. Your treatment has been planned according to current medical practices, but problems sometimes occur. Call your health care provider if you have any problems or questions after your procedure. °What can I expect after the procedure? °After your procedure, it is common to feel sore for up to two weeks. °Follow these instructions at home: °· There are many different ways to close and cover an incision, including stitches (sutures), skin glue, and adhesive strips. Follow your health care provider's instructions about: °? Incision care. °? Bandage (dressing) changes and removal. °? Incision closure removal. °· Check your incision area every day for signs of infection. Watch for: °? Redness, swelling, or pain. °? Fluid, blood, or pus. °· Take medicines only as directed by your health care provider. °· Try to cough often. Coughing helps to protect against lung infection (pneumonia). It may hurt to cough. If this happens, hold a pillow against your chest when you cough. °· Take deep breaths. This also helps to protect against pneumonia. °· If you were given an incentive spirometer, use it as directed by your health care provider. °· Do not take baths, swim, or use a hot tub until your health care provider approves. You may take showers. °· Avoid lifting until your health care provider approves. °· Avoid driving until your health care provider approves. °· Do not travel by airplane after the chest tube is removed until your health care provider approves. °Contact a health care provider if: °· You have a fever. °· Pain medicines do not ease your pain. °· You have redness, swelling, or increasing pain in your incision area. °· You develop a cough that does not go away, or you are coughing up  mucus that is yellow or green. °Get help right away if: °· You have fluid, blood, or pus coming from your incision. °· There is a bad smell coming from your incision or dressing. °· You develop a rash. °· You have difficulty breathing. °· You cough up blood. °· You develop light-headedness or you feel faint. °· You develop chest pain. °· Your heartbeat feels irregular or very fast. °This information is not intended to replace advice given to you by your health care provider. Make sure you discuss any questions you have with your health care provider. °Document Released: 02/02/2005 Document Revised: 03/18/2016 Document Reviewed: 03/31/2014 °Elsevier Interactive Patient Education © 2018 [employer] ° °

## 2017-11-14 NOTE — Progress Notes (Addendum)
Physical Therapy Treatment Patient Details Name: Dan Salazar MRN: [id number] DOB: [birth year] Today's Date: 11/14/2017    History of Present Illness Pt is a 57 y.o. male with PMH of CKD I, essential HTN, who was admitted 10/31/17 for aspiration pneumonia complicated by L-sided pleural effusion and septic shock. Underwent VATS decortication 4/7, and later complicated by severe atelectasis and lung collapse. Has undergone bronchoscopy twice for clearing of secretions and plugging.     PT Comments    Pt progressing well with mobility. Pt's new rollator delivered to room at beginning of session. Practiced ambulation with this; pt with good technique to lock brakes when appropriate and take seated rest break on rollator seat. Initiated stair training; able to do so with supervision for safety. From a mobility perspective, feel pt is safe to return home with use of rollator, supervision from daughter, and HHPT services.    Follow Up Recommendations  Home health PT;Supervision/Assistance - 24 hour     Equipment Recommendations  3in1 (PT)(rollator)    Recommendations for Other Services       Precautions / Restrictions Precautions Precautions: Fall Restrictions Weight Bearing Restrictions: No    Mobility  Bed Mobility Overal bed mobility: Independent                Transfers Overall transfer level: Needs assistance Equipment used: 4-wheeled walker Transfers: Sit to/from Stand Sit to Stand: Supervision Stand pivot transfers: Supervision       General transfer comment: Stood from bed and 1x from rollator seat with supervision for safety; good technique for locking brakes  Ambulation/Gait Ambulation/Gait assistance: Supervision Ambulation Distance (Feet): 350 Feet Assistive device: 4-wheeled walker Gait Pattern/deviations: Step-through pattern;Decreased stride length;Trunk flexed Gait velocity: Decreased   General Gait Details: Slow, controlled amb with RW and supervision  for safety. SpO2 >94% on RA throughout (RN notified). 1x seated rest break on rollator seat   Stairs Stairs: Yes Stairs assistance: Supervision Stair Management: One rail Right;Forwards   General stair comments: Simulated ascending steps into home by high marching with RUE support on rail; supervision for safety   Wheelchair Mobility    Modified Rankin (Stroke Patients Only)       Balance Overall balance assessment: Needs assistance Sitting-balance support: No upper extremity supported;Feet supported Sitting balance-Leahy Scale: Good     Standing balance support: Bilateral upper extremity supported;During functional activity Standing balance-Leahy Scale: Fair Standing balance comment: Can static stand and take steps with no UE support and min guard                            Cognition Arousal/Alertness: Awake/alert Behavior During Therapy: WFL for tasks assessed/performed Overall Cognitive Status: No family/caregiver present to determine baseline cognitive functioning                                 General Comments: Asks the same questions repeatedly; intermittent cues to attend to task      Exercises      General Comments        Pertinent Vitals/Pain Pain Assessment: No/denies pain    Home Living                      Prior Function            PT Goals (current goals can now be found in the care plan section) Acute Rehab PT Goals  Patient Stated Goal: to get better PT Goal Formulation: With patient Time For Goal Achievement: 11/27/17 Potential to Achieve Goals: Good Progress towards PT goals: Progressing toward goals    Frequency    Min 3X/week      PT Plan Current plan remains appropriate    Co-evaluation              AM-PAC PT "6 Clicks" Daily Activity  Outcome Measure  Difficulty turning over in bed (including adjusting bedclothes, sheets and blankets)?: None Difficulty moving from lying on back  to sitting on the side of the bed? : None Difficulty sitting down on and standing up from a chair with arms (e.g., wheelchair, bedside commode, etc,.)?: A Little Help needed moving to and from a bed to chair (including a wheelchair)?: A Little Help needed walking in hospital room?: A Little Help needed climbing 3-5 steps with a railing? : A Little 6 Click Score: 20    End of Session Equipment Utilized During Treatment: Gait belt Activity Tolerance: Patient tolerated treatment well Patient left: in bed;with call bell/phone within reach Nurse Communication: Mobility status PT Visit Diagnosis: Unsteadiness on feet (R26.81);Muscle weakness (generalized) (M62.81)     Time: 5686-1683 PT Time Calculation (min) (ACUTE ONLY): 19 min  Charges:  $Gait Training: 8-22 mins                    G Codes:      Mabeline Caras, PT, DPT Acute Rehab Services  Pager: [place] 11/14/2017, 11:06 AM

## 2017-11-14 NOTE — Progress Notes (Signed)
Spoke w patient w nurse at bedside. Patient will be followed [facility] for [facility], verified w Butch Penny, [facility]. [place] letter on Chart, RN will provide to POA. RW and 3/1 will be delivered to room today prior to DC.

## 2017-11-14 NOTE — Discharge Summary (Signed)
Physician Discharge Summary  Dan Salazar QBH:[id number] DOB: [birth year] DOA: 10/31/2017  PCP: Mardi Mainland, FNP  Admit date: 10/31/2017 Discharge date: 11/14/2017  Admitted From: Home Disposition: Home with home PT/RN  Recommendations for Outpatient Follow-up:  1. Follow up with PCP in 1-2 weeks 2. Please obtain BMP/CBC in one week your next doctors visit.  3. Take Augmentin for 2 more weeks as advised by cardiothoracic surgery 4. Follow-up with outpatient cardiothoracic surgery in 2 weeks  Home Health: Home PT/RN Equipment/Devices: Rolling walker Discharge Condition: Stable CODE STATUS: Full code Diet recommendation: Regular diet  Brief/Interim Summary: 57 year old male with history of CKD stage I, essential hypertension was admitted to the hospital for aspiration pneumonia complicated by left-sided pleural effusion and septic shock.  He underwent VATS decortication on November 04, 971 and later complicated by severe atelectasis and lung collapse.  He is also gone bronchoscopy twice for clearing of his secretions and plugging.  Today he is doing much better. During this time patient was getting IV antibiotics vancomycin and Zosyn but on the day of discharge I discussed the case with cardiothoracic surgery who recommended discharging patient on 2 weeks of oral Augmentin and following up with them.  Patient also appeared quite weak therefore physical therapy and Occupational Therapy were consulted who recommended patient will benefit from home health physical therapy therefore arrangements were made.  At this time patient is medically stable and has reached maximum benefit from an hospital stay.  He is to be discharged today.  No complaints this morning and patient wishes to go home.  HEENT/EYES = negative for pain, redness, loss of vision, double vision, blurred vision, loss of hearing, sore throat, hoarseness, dysphagia Cardiovascular= negative for chest pain, palpitation, murmurs,  lower extremity swelling Respiratory/lungs= negative for shortness of breath, cough, hemoptysis, wheezing, mucus production Gastrointestinal= negative for nausea, vomiting,, abdominal pain, melena, hematemesis Genitourinary= negative for Dysuria, Hematuria, Change in Urinary Frequency MSK = Negative for arthralgia, myalgias, Back Pain, Joint swelling  Neurology= Negative for headache, seizures, numbness, tingling  Psychiatry= Negative for anxiety, depression, suicidal and homocidal ideation Allergy/Immunology= Medication/Food allergy as listed  Skin= Negative for Rash, lesions, ulcers, itching  During this time   Discharge Diagnoses:  Principal Problem:   Pleural effusion on left Active Problems:   Hyponatremia   Tachycardia   Pneumonia   Atelectasis   Difficult intravenous access   Encounter for intubation   Acute respiratory failure with hypoxia (HCC)   Acute hypoxic respiratory failure secondary to aspiration pneumonia, greatly improved Complicated loculated left-sided pleural effusion and empyema status post video-assisted thoracotomy on November 03, 2017 Post operative atelectasis, improved - Initially patient was treated with IV Zosyn and vancomycin.    Outpatient transition to oral Augmentin to finish 14-day course and follow-up with outpatient cardiothoracic surgery. -Underwent bronchoscopy on November 07, 2017 to remove secretions -Provide supportive care, nebulizer treatments  Dysphagia -Evaluate by speech therapy recommended regular diet  Memory problems -Low B12 level therefore started on supplements -TSH is normal  CKD stage I-2 -Avoid nephrotoxic drugs.  Follow-up outpatient with primary care physician   DVT prophylaxis: Heparin while here  Code Status: Full code Family Communication: None at bedside Disposition Plan:  Discharge today  Consultants:   Pulmonary  Cardiothoracic surgery  Procedures:   Intubation November 05, 2017  Bronchoscopy November 05, 2017  Central line placement November 05, 2017  Extubation November 06, 2017  Repeat bronchoscopy November 07, 2017  Video-assisted thoracotomy on November 03, 2017  Discharge Instructions   Allergies as of 11/14/2017      Reactions   Hctz [hydrochlorothiazide] Other (See Comments)   Severe electrolyte abnormalities      Medication List    TAKE these medications   amoxicillin-clavulanate 875-125 MG tablet Commonly known as:  AUGMENTIN Take 1 tablet by mouth every 12 (twelve) hours for 14 days.   ipratropium-albuterol 0.5-2.5 (3) MG/3ML Soln Commonly known as:  DUONEB Take 3 mLs by nebulization every 6 (six) hours as needed.   lisinopril 40 MG tablet Commonly known as:  PRINIVIL,ZESTRIL Take 20 mg by mouth daily.   saccharomyces boulardii 250 MG capsule Commonly known as:  FLORASTOR Take 1 capsule (250 mg total) by mouth 2 (two) times daily for 10 days.   tamsulosin 0.4 MG Caps capsule Commonly known as:  FLOMAX Take 0.4 mg by mouth daily.   thiamine 100 MG tablet Commonly known as:  VITAMIN B-1 Take 1 tablet (100 mg total) by mouth daily.   traMADol 50 MG tablet Commonly known as:  ULTRAM Take 1 tablet (50 mg total) by mouth every 6 (six) hours as needed (mild pain). What changed:  Another medication with the same name was added. Make sure you understand how and when to take each.   traMADol 50 MG tablet Commonly known as:  ULTRAM Take 1 tablet (50 mg total) by mouth every 12 (twelve) hours as needed for moderate pain. What changed:  You were already taking a medication with the same name, and this prescription was added. Make sure you understand how and when to take each.   vitamin B-12 100 MCG tablet Commonly known as:  CYANOCOBALAMIN Take 1 tablet (100 mcg total) by mouth daily.            Durable Medical Equipment  (From admission, onward)        Start     Ordered   11/13/17 1633  For home use only DME 3 n 1  Once     11/13/17 1632   11/13/17 1632   For home use only DME 4 wheeled rolling walker with seat  Once    Question:  Patient needs a walker to treat with the following condition  Answer:  Pneumonia   11/13/17 1632     Follow-up Information    Mardi Mainland, FNP Follow up.   Specialty:  Nurse Practitioner Why:  appt scheduled for 11/21/2017 at 2:30 pm. please call to reschedule if you are unable to keep appointment Contact information: [place] [address] [phone]        [facility] [place] Follow up.   Specialty:  Home Health Services Why:  Home Health Physical Therapy, RN and Social Worker- agency will call to arrange initial visits Contact information: [address] [place] [address] [phone]        Melrose Nakayama, MD Follow up.   Specialty:  Cardiothoracic Surgery Why:  office will contact for 2 week appointment and suture removal Contact information: [address] [place] [address] [phone]          Allergies  Allergen Reactions  . Hctz [Hydrochlorothiazide] Other (See Comments)    Severe electrolyte abnormalities    You were cared for by a hospitalist during your hospital stay. If you have any questions about your discharge medications or the care you received while you were in the hospital after you are discharged, you can call the unit and asked to  speak with the hospitalist on call if the hospitalist that took care of you is not available. Once you are discharged, your primary care physician will handle any further medical issues. Please note that no refills for any discharge medications will be authorized once you are discharged, as it is imperative that you return to your primary care physician (or establish a relationship with a primary care physician if you do not have one) for your aftercare needs so that they can reassess your need for medications and monitor your lab values.  Consultations:  Cardiothoracic  surgery   Procedures/Studies: Dg Chest 2 View  Result Date: 10/31/2017 CLINICAL DATA:  Worsening shortness of breath over the past 6 months. History of sickle cell anemia. EXAM: CHEST - 2 VIEW COMPARISON:  Chest x-ray dated September 28, 2016. FINDINGS: The left heart border is obscured. Moderate to large left-sided pleural effusion with consolidation/collapse of the left lower lobe and atelectasis in the lingula. The right lung is clear. No pneumothorax. No acute osseous abnormality. IMPRESSION: 1. New moderate to large left-sided pleural effusion with adjacent left lung collapse. Electronically Signed   By: Titus Dubin M.D.   On: 10/31/2017 18:32   Ct Head Wo Contrast  Result Date: 10/31/2017 CLINICAL DATA:  Altered mental status. EXAM: CT HEAD WITHOUT CONTRAST TECHNIQUE: Contiguous axial images were obtained from the base of the skull through the vertex without intravenous contrast. COMPARISON:  None. FINDINGS: Brain: No evidence of acute infarction, hemorrhage, hydrocephalus, extra-axial collection or mass lesion/mass effect. Mild generalized cerebral atrophy. Moderate periventricular and subcortical white matter hypodensities are nonspecific but favored to reflect chronic microvascular ischemic changes. 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 Vascular: Calcified atherosclerosis at the skullbase. No hyperdense vessel. Skull: Normal. Negative for fracture or focal lesion. Sinuses/Orbits: No acute finding. Other: 1.3 cm oval lesion in the subcutaneous tissues of the left parietal scalp may represent a sebaceous cyst. IMPRESSION: 1.  No acute intracranial abnormality. 2. Mild atrophy and moderate chronic microvascular ischemic changes. Electronically Signed   By: Titus Dubin M.D.   On: 10/31/2017 21:30   Ct Angio Chest Pe W And/or Wo Contrast  Result Date: 10/31/2017 CLINICAL DATA:  Hypertension and dyspnea for 4 weeks. Cough. Shortness of breath. EXAM: CT  ANGIOGRAPHY CHEST WITH CONTRAST TECHNIQUE: Multidetector CT imaging of the chest was performed using the standard protocol during bolus administration of intravenous contrast. Multiplanar CT image reconstructions and MIPs were obtained to evaluate the vascular anatomy. CONTRAST:  167mL ISOVUE-370 IOPAMIDOL (ISOVUE-370) INJECTION 76% COMPARISON:  None. FINDINGS: Cardiovascular: Good opacification of the central and segmental pulmonary arteries. No focal filling defects. No evidence of significant pulmonary embolus. Normal caliber thoracic aorta with scattered calcifications. Great vessel origins are patent. Normal heart size with small to moderate pericardial effusion. Coronary artery calcifications. Mediastinum/Nodes: Esophagus is decompressed. Small esophageal hiatal hernia. No significant lymphadenopathy in the chest. Lungs/Pleura: There is a large amount of fluid and debris at the distal trachea involving the origin of the left mainstem bronchus with near complete filling of the left mainstem and central bronchi. This likely represents aspiration although mucous impaction or secretions could also possibly have this appearance. Obstructing endobronchial lesion could also be present. There is volume loss and consolidation of the left lower lung. Large left pleural effusion with possible loculation. Right lung is clear and expanded. No significant fluid or secretions on the right. No pneumothorax. Upper Abdomen: No acute abnormality. Musculoskeletal: Degenerative changes in the spine. No  destructive bone lesions. Review of the MIP images confirms the above findings. IMPRESSION: 1. No evidence of significant pulmonary embolus. 2. Large amount of fluid and debris filling the left mainstem bronchus and extending into the left central bronchi. This is likely due to aspiration. Mucous secretions or obstructing endobronchial lesion are other possibilities. 3. Consolidation and volume loss in the left lower lung, likely  infectious or postobstructive. 4. Large left pleural effusion with possible loculation. 5. Pericardial effusion. Aortic Atherosclerosis (ICD10-I70.0). Electronically Signed   By: Lucienne Capers M.D.   On: 10/31/2017 21:37   Dg Chest Port 1 View  Result Date: 11/12/2017 CLINICAL DATA:  Pneumonia EXAM: PORTABLE CHEST 1 VIEW COMPARISON:  11/11/2017 FINDINGS: Improved aeration in the left lung base with decrease in airspace disease and small left effusion. Improved aeration in the right lung base which now clear. Central venous catheter has been removed. IMPRESSION: Interval improvement in aeration of the lung bases. Persistent left lower lobe airspace disease and left pleural effusion with improvement since the prior study. Electronically Signed   By: Franchot Gallo M.D.   On: 11/12/2017 07:58   Dg Chest Port 1 View  Result Date: 11/11/2017 CLINICAL DATA:  Atelectasis. EXAM: PORTABLE CHEST 1 VIEW COMPARISON:  Radiograph of November 10, 2017. FINDINGS: Stable cardiomediastinal silhouette. No pneumothorax is noted. Endotracheal and nasogastric tubes have been removed. Left internal jugular catheter is unchanged in position. No pneumothorax is noted. Minimal right basilar subsegmental atelectasis is noted. Stable left basilar atelectasis or infiltrate is noted with small left pleural effusion. Left pleural drainage catheter is not visualized. Bony thorax is unremarkable. IMPRESSION: Stable left basilar atelectasis or infiltrate with small left pleural effusion. Left pleural drainage catheter is no longer visualized. Endotracheal and nasogastric tubes have been removed. Electronically Signed   By: Marijo Conception, M.D.   On: 11/11/2017 07:22   Dg Chest Port 1 View  Result Date: 11/10/2017 CLINICAL DATA:  Pneumonia.  Empyema.  ETT present EXAM: PORTABLE CHEST 1 VIEW COMPARISON:  11/09/2017 FINDINGS: Support lines and tubes in appropriate position. Left pleural catheter is again seen. Small left pleural effusion  and left lower lobe atelectasis or consolidation show no significant change. Right lung is clear. IMPRESSION: Stable small left pleural effusion and left lower lobe atelectasis or consolidation. Electronically Signed   By: Earle Gell M.D.   On: 11/10/2017 08:40   Dg Chest Port 1 View  Result Date: 11/09/2017 CLINICAL DATA:  Chest tube EXAM: PORTABLE CHEST 1 VIEW COMPARISON:  Portable exam 1551 hours compared to 0520 hours FINDINGS: Tip of endotracheal tube projects 6.0 cm above carina. Nasogastric tube extends into stomach. LEFT basilar thoracostomy tube and LEFT upper chest thoracostomy tubes stable. LEFT jugular central venous catheter with tip projecting over SVC. Upper normal heart size. Mediastinal contours and pulmonary vascularity normal. Persistent LEFT basilar atelectasis and pleural effusion. Remaining lungs clear. No pneumothorax. IMPRESSION: LEFT thoracostomy tubes with residual LEFT basilar effusion and atelectasis. No pneumothorax. Electronically Signed   By: Lavonia Dana M.D.   On: 11/09/2017 16:11   Dg Chest Port 1 View  Result Date: 11/09/2017 CLINICAL DATA:  Pneumonia EXAM: PORTABLE CHEST 1 VIEW COMPARISON:  November 07, 2017 FINDINGS: The ETT and left central line are in good position. The NG tube terminates below today's film. No pneumothorax. No nodules or masses. Persistent left effusion with underlying opacity. Persistent left chest tubes. IMPRESSION: 1. Stable support apparatus. 2. Persistent left-sided pleural effusion and underlying opacity. Electronically Signed  By: Dorise Bullion III M.D   On: 11/09/2017 07:14   Dg Chest Port 1 View  Result Date: 11/07/2017 CLINICAL DATA:  ETT AND OG PLACEMENT EXAM: PORTABLE CHEST 1 VIEW COMPARISON:  11/07/2017 FINDINGS: Endotracheal tube 7.5 cm from carina. Placement NG tube extends the stomach. Central venous line, 2 LEFT chest tubes unchanged. Marked reduction in volume of LEFT pleural effusion. Loculated fluid remains. RIGHT lung  relatively clear. IMPRESSION: 1. Endotracheal tube in good position.  NGT placed appropriately 2. Marked reduction in LEFT pleural effusion. Electronically Signed   By: Suzy Bouchard M.D.   On: 11/07/2017 10:37   Dg Chest Port 1 View  Result Date: 11/07/2017 CLINICAL DATA:  Atelectasis EXAM: PORTABLE CHEST 1 VIEW COMPARISON:  Yesterday FINDINGS: Chest tubes on the left are in stable position. Interval extubation of the trachea and esophagus. Significant interval increase in left lung collapse. The right lung is clear. No visible pneumothorax. No gross change in heart size. IMPRESSION: Extubation with significant interval left lung collapse Electronically Signed   By: Monte Fantasia M.D.   On: 11/07/2017 08:40   Dg Chest Port 1 View  Result Date: 11/06/2017 CLINICAL DATA:  Status post removal of a chest tube on the left. EXAM: PORTABLE CHEST 1 VIEW COMPARISON:  Portable chest x-ray of November 06, 2017 at 4:10 a.m. FINDINGS: The patient has undergone removal of the lateral chest tube on the left. The medial chest tube remains in place with the tip in the medial interspace of the posterior fourth and fifth ribs. The lower chest tube projects over the costophrenic gutter. There is a faint pleural line in the pulmonary apex consistent with a sliver of a pneumothorax. The right lung is clear. There is no mediastinal shift. The endotracheal tube, esophagogastric tube, and left internal jugular venous catheter appears stable. IMPRESSION: Persistent tiny left apical pneumothorax following left chest tube removal. The 2 remaining chest tubes are in stable position. Stable small left pleural effusion. Electronically Signed   By: David  Martinique M.D.   On: 11/06/2017 12:36   Dg Chest Port 1 View  Result Date: 11/06/2017 CLINICAL DATA:  Follow-up VATS EXAM: PORTABLE CHEST 1 VIEW COMPARISON:  11/05/2017 FINDINGS: Endotracheal tube, nasogastric catheter and left jugular central line are again seen and stable. Three  chest tubes are again noted on the left. Mild left basilar atelectasis is again noted and stable. No significant pneumothorax is seen. IMPRESSION: Postoperative change with tubes and lines as described. Stable changes in the left base. Electronically Signed   By: Inez Catalina M.D.   On: 11/06/2017 07:22   Dg Chest Port 1 View  Result Date: 11/05/2017 CLINICAL DATA:  Status post endotracheal and esophageal intubation. EXAM: PORTABLE CHEST 1 VIEW COMPARISON:  Portable chest x-ray of today's date at 4:08 a.m. FINDINGS: The endotracheal tube tip lies approximately 7.5 cm above the carina and is at the level of the inferior margin of the clavicular heads. The esophagogastric tubes proximal port and tip project below the GE junction below the inferior margin of the image. The 3 left chest tubes are in stable position. The interstitial markings of the left lung are increased but the volume of pleural fluid has decreased. The right lung is clear. The heart and pulmonary vascularity are normal. The left internal jugular venous catheter tip projects over the midportion of the SVC. IMPRESSION: Intubation of the trachea and esophagus with reasonable positioning of the tubes. Decreased opacification of the left hemithorax with improved aeration of  the left lung. No pneumothorax. The 3 left chest tubes are in stable position. Electronically Signed   By: David  Martinique M.D.   On: 11/05/2017 10:25   Dg Chest Port 1 View  Result Date: 11/05/2017 CLINICAL DATA:  Pneumonia.  Shortness of breath. EXAM: PORTABLE CHEST 1 VIEW COMPARISON:  11/04/2017. FINDINGS: Two left chest tubes in stable position. No pneumothorax. Persistent left lung consolidation and volume loss with left-sided pleural effusion noted. No interim change. Probable cardiomegaly. Heart size difficult to evaluate due to left lung consolidation. Degenerative changes thoracic spine with thoracic spine scoliosis. IMPRESSION: 1.  Two left chest tubes in stable position.   No pneumothorax. 2. Persistent left lung consolidation and volume loss with left-sided pleural effusion. No interim change. Electronically Signed   By: Marcello Moores  Register   On: 11/05/2017 06:57   Dg Chest Port 1 View  Result Date: 11/04/2017 CLINICAL DATA:  Airspace consolidation EXAM: PORTABLE CHEST 1 VIEW COMPARISON:  November 04, 2017 study obtained earlier in the day FINDINGS: Chest tube positions on the left are stable. There is extensive consolidation with volume loss on the left. There is also left pleural effusion. Right lung is hyperexpanded but clear. There is mild cardiomegaly. Pulmonary vascularity on the right appears normal. There is compression pulmonary vessels on the left. No adenopathy is evident. No bone lesions. IMPRESSION: Consolidation with volume loss and effusion on the left. Chest tubes remain in place on the left without pneumothorax. Right lung hyperexpanded but clear. Stable cardiac silhouette. Electronically Signed   By: Lowella Grip III M.D.   On: 11/04/2017 14:49   Dg Chest Port 1 View  Result Date: 11/04/2017 CLINICAL DATA:  Pleural effusion on the left EXAM: PORTABLE CHEST 1 VIEW COMPARISON:  Yesterday FINDINGS: Empyema decortication yesterday. There is worsening volume and increased opacity on the left. Stable chest tube positioning. No visible pneumothorax. Right lung is clear. Stable apparent cardiomegaly related to pleural disease by CT. IMPRESSION: 1. Worsening left-sided aeration. 2. Possible increase in pleural fluid. Electronically Signed   By: Monte Fantasia M.D.   On: 11/04/2017 07:15   Dg Chest Port 1 View  Result Date: 11/03/2017 CLINICAL DATA:  Chest tube placement.  Pneumothorax. EXAM: PORTABLE CHEST 1 VIEW COMPARISON:  11/03/2017 at 5:49 a.m. FINDINGS: Since the earlier study, 2 left-sided chest tubes have been placed, both with their tips near the left apex, 1 lateral and 1 medial. There has been a significant reduction in the left hemithorax opacification  consistent with a vacuo a shin of pleural fluid. There is persistent opacity at the left lung base consistent with atelectasis or pneumonia. Minimal pneumothorax suggests that the lung apex. Mild reticular opacity at the lateral right lung base consistent with atelectasis. Probable small right effusion. Remainder the right lung is clear. IMPRESSION: 1. Marked improvement in left lung aeration following placement 2 left-sided chest tubes. 2. Minimal left apical pneumothorax. Electronically Signed   By: Lajean Manes M.D.   On: 11/03/2017 14:20   Dg Chest Port 1 View  Result Date: 11/03/2017 CLINICAL DATA:  Follow-up loculated LEFT pleural effusion. EXAM: PORTABLE CHEST 1 VIEW COMPARISON:  CT chest 10/31/2017. Chest x-rays 10/31/2017, 09/28/2016 and earlier. FINDINGS: Since the CT 3 days ago, the loculated LEFT pleural effusion has increased in size. The passive atelectasis and/or pneumonia throughout the LEFT lung has worsened as a result. RIGHT lung remains clear with evidence of emphysema. Heart size upper normal. IMPRESSION: Enlarging loculated LEFT pleural effusion with worsening aeration in the  LEFT lung since the CT 3 days ago. Electronically Signed   By: Evangeline Dakin M.D.   On: 11/03/2017 08:40     Subjective:   Discharge Exam: Vitals:   11/13/17 2031 11/14/17 0805  BP:  (!) (P) 162/78  Pulse: 82 80  Resp: 16 18  Temp:  (P) 98.2 F (36.8 C)  SpO2: 99% 98%   Vitals:   11/13/17 1613 11/13/17 2031 11/14/17 0500 11/14/17 0805  BP:    (!) (P) 162/78  Pulse:  82  80  Resp:  16  18  Temp:    (P) 98.2 F (36.8 C)  TempSrc:    (P) Oral  SpO2: 100% 99%  98%  Weight:   77.4 kg (170 lb 10.2 oz)   Height:        General: Pt is alert, awake, not in acute distress Cardiovascular: RRR, S1/S2 +, no rubs, no gallops Respiratory: CTA bilaterally, no wheezing, no rhonchi Abdominal: Soft, NT, ND, bowel sounds + Extremities: no edema, no cyanosis    The results of significant diagnostics  from this hospitalization (including imaging, microbiology, ancillary and laboratory) are listed below for reference.     Microbiology: Recent Results (from the past 240 hour(s))  Culture, respiratory (NON-Expectorated)     Status: None   Collection Time: 11/07/17 10:12 AM  Result Value Ref Range Status   Specimen Description TRACHEAL ASPIRATE  Final   Special Requests NONE  Final   Gram Stain   Final    RARE WBC PRESENT, PREDOMINANTLY PMN NO ORGANISMS SEEN Performed at [facility], [address], [place], [place] [address]    Culture RARE CANDIDA TROPICALIS  Final   Report Status 11/11/2017 FINAL  Final     Labs: BNP (last 3 results) No results for input(s): BNP in the last 8760 hours. Basic Metabolic Panel: Recent Labs  Lab 11/07/17 1809 11/08/17 0505 11/08/17 1715 11/09/17 0631 11/10/17 0327 11/11/17 0630 11/13/17 0309  NA  --  142  --  141 139 139 136  K  --  3.3*  --  3.5 3.4* 3.5 4.2  CL  --  110  --  109 106 103 102  CO2  --  24  --  26 26 26 26   GLUCOSE  --  108*  --  101* 125* 99 111*  BUN  --  12  --  10 16 17 20   CREATININE  --  0.91  --  0.86 0.80 0.96 0.97  CALCIUM  --  7.7*  --  7.9* 7.9* 8.1* 8.6*  MG 1.8 1.8 2.7*  --   --   --   --   PHOS 3.1 3.2 3.4  --   --   --   --    Liver Function Tests: Recent Labs  Lab 11/08/17 0505 11/09/17 0631  AST 9* 10*  ALT 12* 12*  ALKPHOS 86 116  BILITOT 0.6 0.6  PROT 4.6* 5.0*  ALBUMIN 1.8* 1.9*   No results for input(s): LIPASE, AMYLASE in the last 168 hours. No results for input(s): AMMONIA in the last 168 hours. CBC: Recent Labs  Lab 11/08/17 0505 11/09/17 0631 11/12/17 1418 11/13/17 0309  WBC 8.0 7.9 8.2 7.7  HGB 7.6* 7.7* 10.1* 10.4*  HCT 23.8* 24.4* 32.4* 33.6*  MCV 96.4 94.9 96.1 96.0  PLT 272 308 351 312   Cardiac Enzymes: No results for input(s): CKTOTAL, CKMB, CKMBINDEX, TROPONINI in the last 168 hours. BNP: Invalid input(s): POCBNP CBG:  Recent Labs  Lab 11/10/17 1936  11/11/17 0012 11/11/17 0441 11/11/17 0744 11/11/17 1129  GLUCAP 109* 113* 92 111* 166*   D-Dimer No results for input(s): DDIMER in the last 72 hours. Hgb A1c No results for input(s): HGBA1C in the last 72 hours. Lipid Profile No results for input(s): CHOL, HDL, LDLCALC, TRIG, CHOLHDL, LDLDIRECT in the last 72 hours. Thyroid function studies No results for input(s): TSH, T4TOTAL, T3FREE, THYROIDAB in the last 72 hours.  Invalid input(s): FREET3 Anemia work up No results for input(s): VITAMINB12, FOLATE, FERRITIN, TIBC, IRON, RETICCTPCT in the last 72 hours. Urinalysis    Component Value Date/Time   COLORURINE YELLOW 11/03/2017 [place] 11/03/2017 0635   LABSPEC 1.011 11/03/2017 0635   PHURINE 5.0 11/03/2017 0635   GLUCOSEU NEGATIVE 11/03/2017 0635   HGBUR NEGATIVE 11/03/2017 0635   BILIRUBINUR NEGATIVE 11/03/2017 0635   [place] 11/03/2017 0635   PROTEINUR NEGATIVE 11/03/2017 0635   NITRITE NEGATIVE 11/03/2017 0635   LEUKOCYTESUR NEGATIVE 11/03/2017 0635   Sepsis Labs Invalid input(s): PROCALCITONIN,  WBC,  LACTICIDVEN Microbiology Recent Results (from the past 240 hour(s))  Culture, respiratory (NON-Expectorated)     Status: None   Collection Time: 11/07/17 10:12 AM  Result Value Ref Range Status   Specimen Description TRACHEAL ASPIRATE  Final   Special Requests NONE  Final   Gram Stain   Final    RARE WBC PRESENT, PREDOMINANTLY PMN NO ORGANISMS SEEN Performed at [facility], [address], [place], [place] [address]    Culture RARE CANDIDA TROPICALIS  Final   Report Status 11/11/2017 FINAL  Final   You were cared for by a hospitalist during your hospital stay. If you have any questions about your discharge medications or the care you received while you were in the hospital after you are discharged, you can call the unit and asked to speak with the hospitalist on call if the hospitalist that took care of you is not available. Once  you are discharged, your primary care physician will handle any further medical issues. Please note that NO REFILLS for any discharge medications will be authorized once you are discharged, as it is imperative that you return to your primary care physician (or establish a relationship with a primary care physician if you do not have one) for your aftercare needs so that they can reassess your need for medications and monitor your lab values.  Please request your Prim.MD to go over all Hospital Tests and Procedure/Radiological results at the follow up, please get all Hospital records sent to your Prim MD by signing hospital release before you go home.  Get CBC, CMP, 2 view Chest X ray checked  by Primary MD during your next visit or SNF MD in 5-7 days ( we routinely change or add medications that can affect your baseline labs and fluid status, therefore we recommend that you get the mentioned basic workup next visit with your PCP, your PCP may decide not to get them or add new tests based on their clinical decision)  On your next visit with your primary care physician please Get Medicines reviewed and adjusted.  If you experience worsening of your admission symptoms, develop shortness of breath, life threatening emergency, suicidal or homicidal thoughts you must seek medical attention immediately by calling 911 or calling your MD immediately  if symptoms less severe.  You Must read complete instructions/literature along with all the possible adverse reactions/side effects for all the Medicines you  take and that have been prescribed to you. Take any new Medicines after you have completely understood and accpet all the possible adverse reactions/side effects.   Do not drive, operate heavy machinery, perform activities at heights, swimming or participation in water activities or provide baby sitting services if your were admitted for syncope or siezures until you have seen by Primary MD or a Neurologist and  advised to do so again.  Do not drive when taking Pain medications.  Time coordinating discharge: 35 mins  SIGNED:   Damita Lack, MD  [facility] 11/14/2017, 11:11 AM Pager   If 7PM-7AM, please contact night-coverage [email or web address] Password [email or web address]

## 2017-11-19 ENCOUNTER — Encounter: Payer: Self-pay | Admitting: Thoracic Surgery (Cardiothoracic Vascular Surgery)

## 2017-11-21 ENCOUNTER — Encounter (INDEPENDENT_AMBULATORY_CARE_PROVIDER_SITE_OTHER): Payer: Self-pay

## 2017-11-21 DIAGNOSIS — Z4802 Encounter for removal of sutures: Secondary | ICD-10-CM

## 2017-12-02 ENCOUNTER — Other Ambulatory Visit: Payer: Self-pay | Admitting: Thoracic Surgery (Cardiothoracic Vascular Surgery)

## 2017-12-02 DIAGNOSIS — J90 Pleural effusion, not elsewhere classified: Secondary | ICD-10-CM

## 2017-12-03 ENCOUNTER — Encounter: Payer: Self-pay | Admitting: Thoracic Surgery (Cardiothoracic Vascular Surgery)

## 2017-12-03 ENCOUNTER — Other Ambulatory Visit: Payer: Self-pay

## 2017-12-03 ENCOUNTER — Ambulatory Visit (INDEPENDENT_AMBULATORY_CARE_PROVIDER_SITE_OTHER): Payer: Self-pay | Admitting: Thoracic Surgery (Cardiothoracic Vascular Surgery)

## 2017-12-03 ENCOUNTER — Ambulatory Visit
Admission: RE | Admit: 2017-12-03 | Discharge: 2017-12-03 | Disposition: A | Discharge status: Home / Self Care | Payer: Self-pay | Source: Ambulatory Visit | Attending: Thoracic Surgery (Cardiothoracic Vascular Surgery) | Admitting: Thoracic Surgery (Cardiothoracic Vascular Surgery)

## 2017-12-03 VITALS — BP 140/82 | HR 48 | Resp 16 | Ht 74.0 in | Wt 180.0 lb

## 2017-12-03 DIAGNOSIS — J90 Pleural effusion, not elsewhere classified: Secondary | ICD-10-CM

## 2017-12-03 DIAGNOSIS — Z09 Encounter for follow-up examination after completed treatment for conditions other than malignant neoplasm: Secondary | ICD-10-CM

## 2017-12-03 IMAGING — DX DG CHEST 2V
2 series · 2 of 2 positions shown · non-contrast
Comparison: Portable chest x-ray of [DATE] and CT chest of
[DATE]

CLINICAL DATA: History of [facility] TS in [DATE] for empyema on the
left

EXAM:
CHEST - 2 VIEW

[dg chest 2 view (1 of 2)]
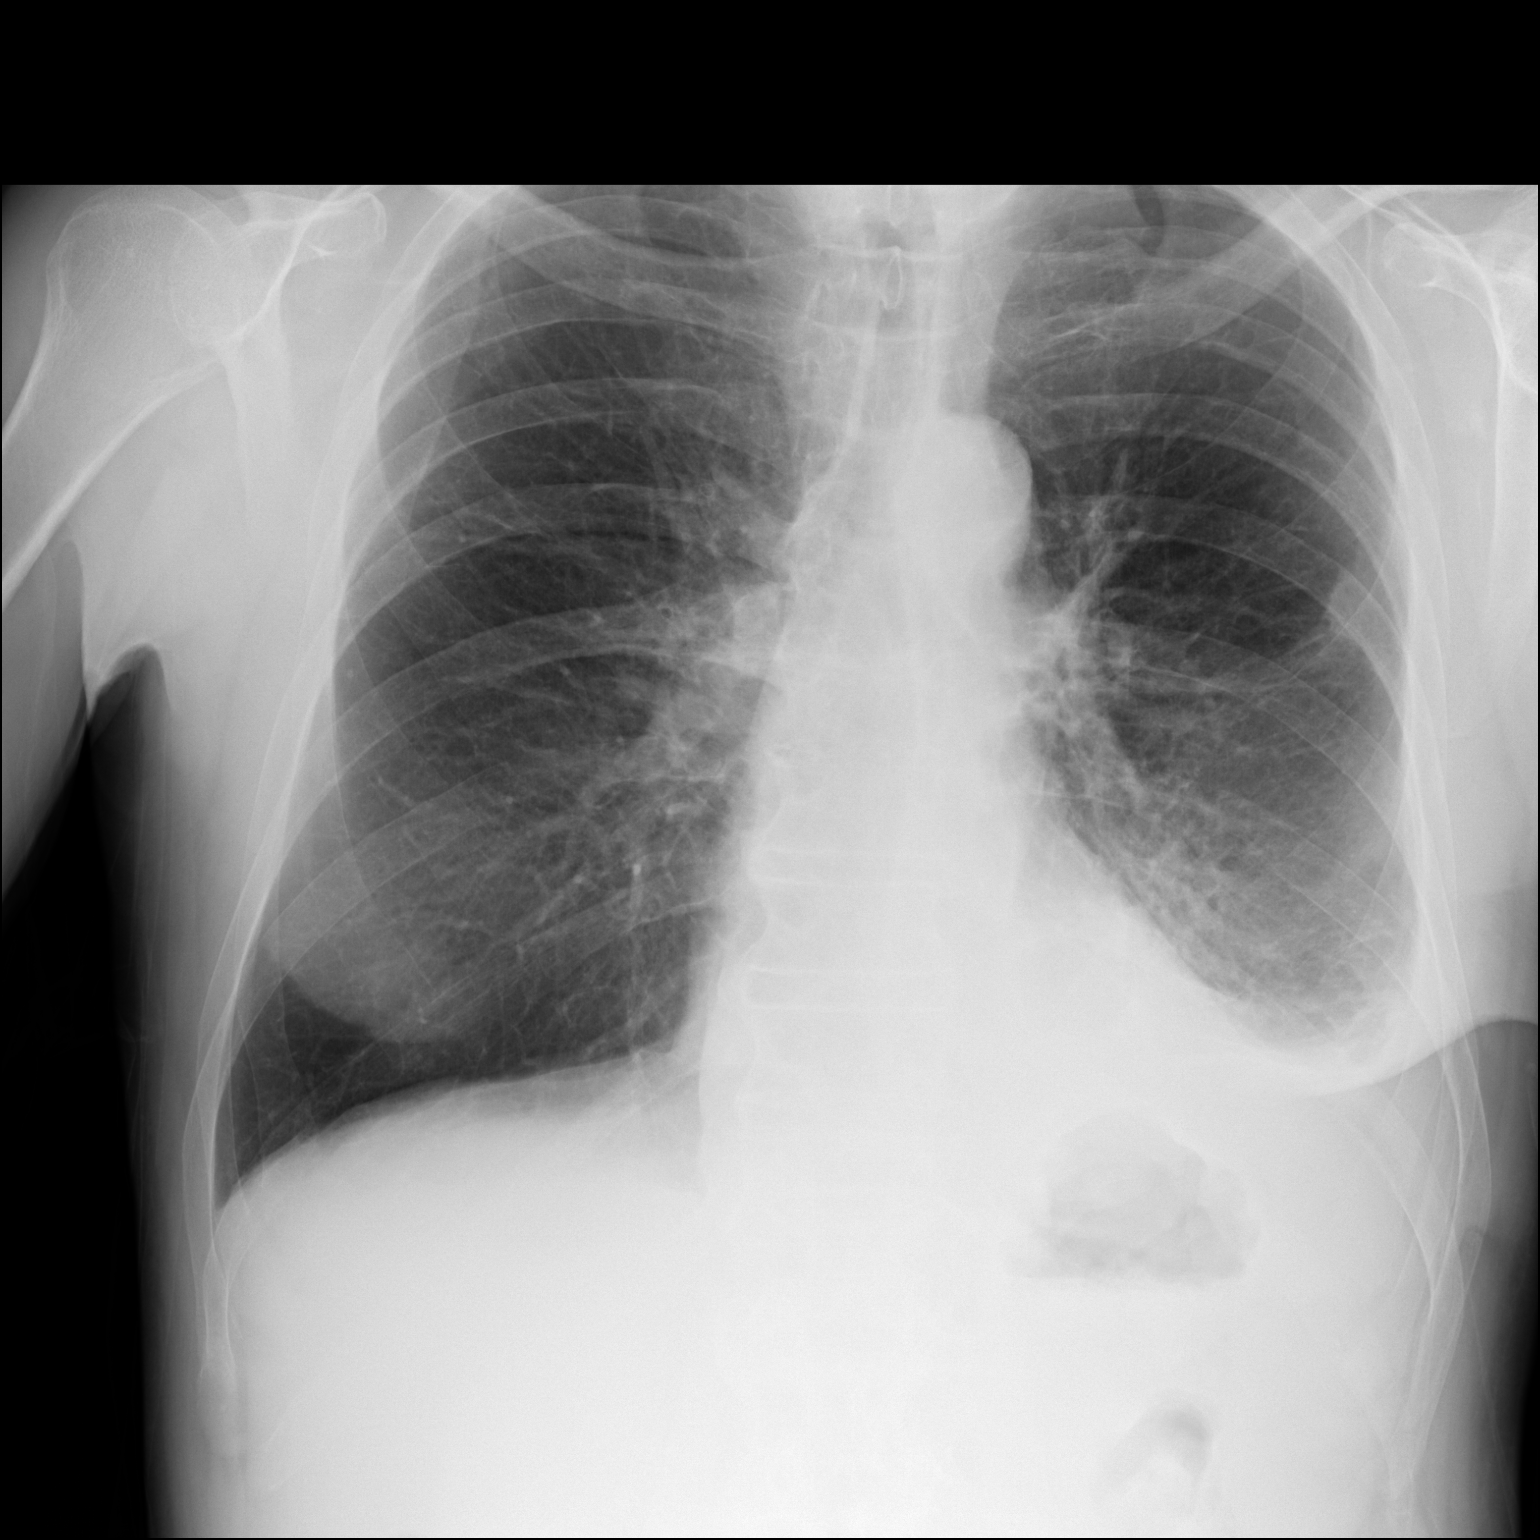

[dg chest 2 view (2 of 2)]
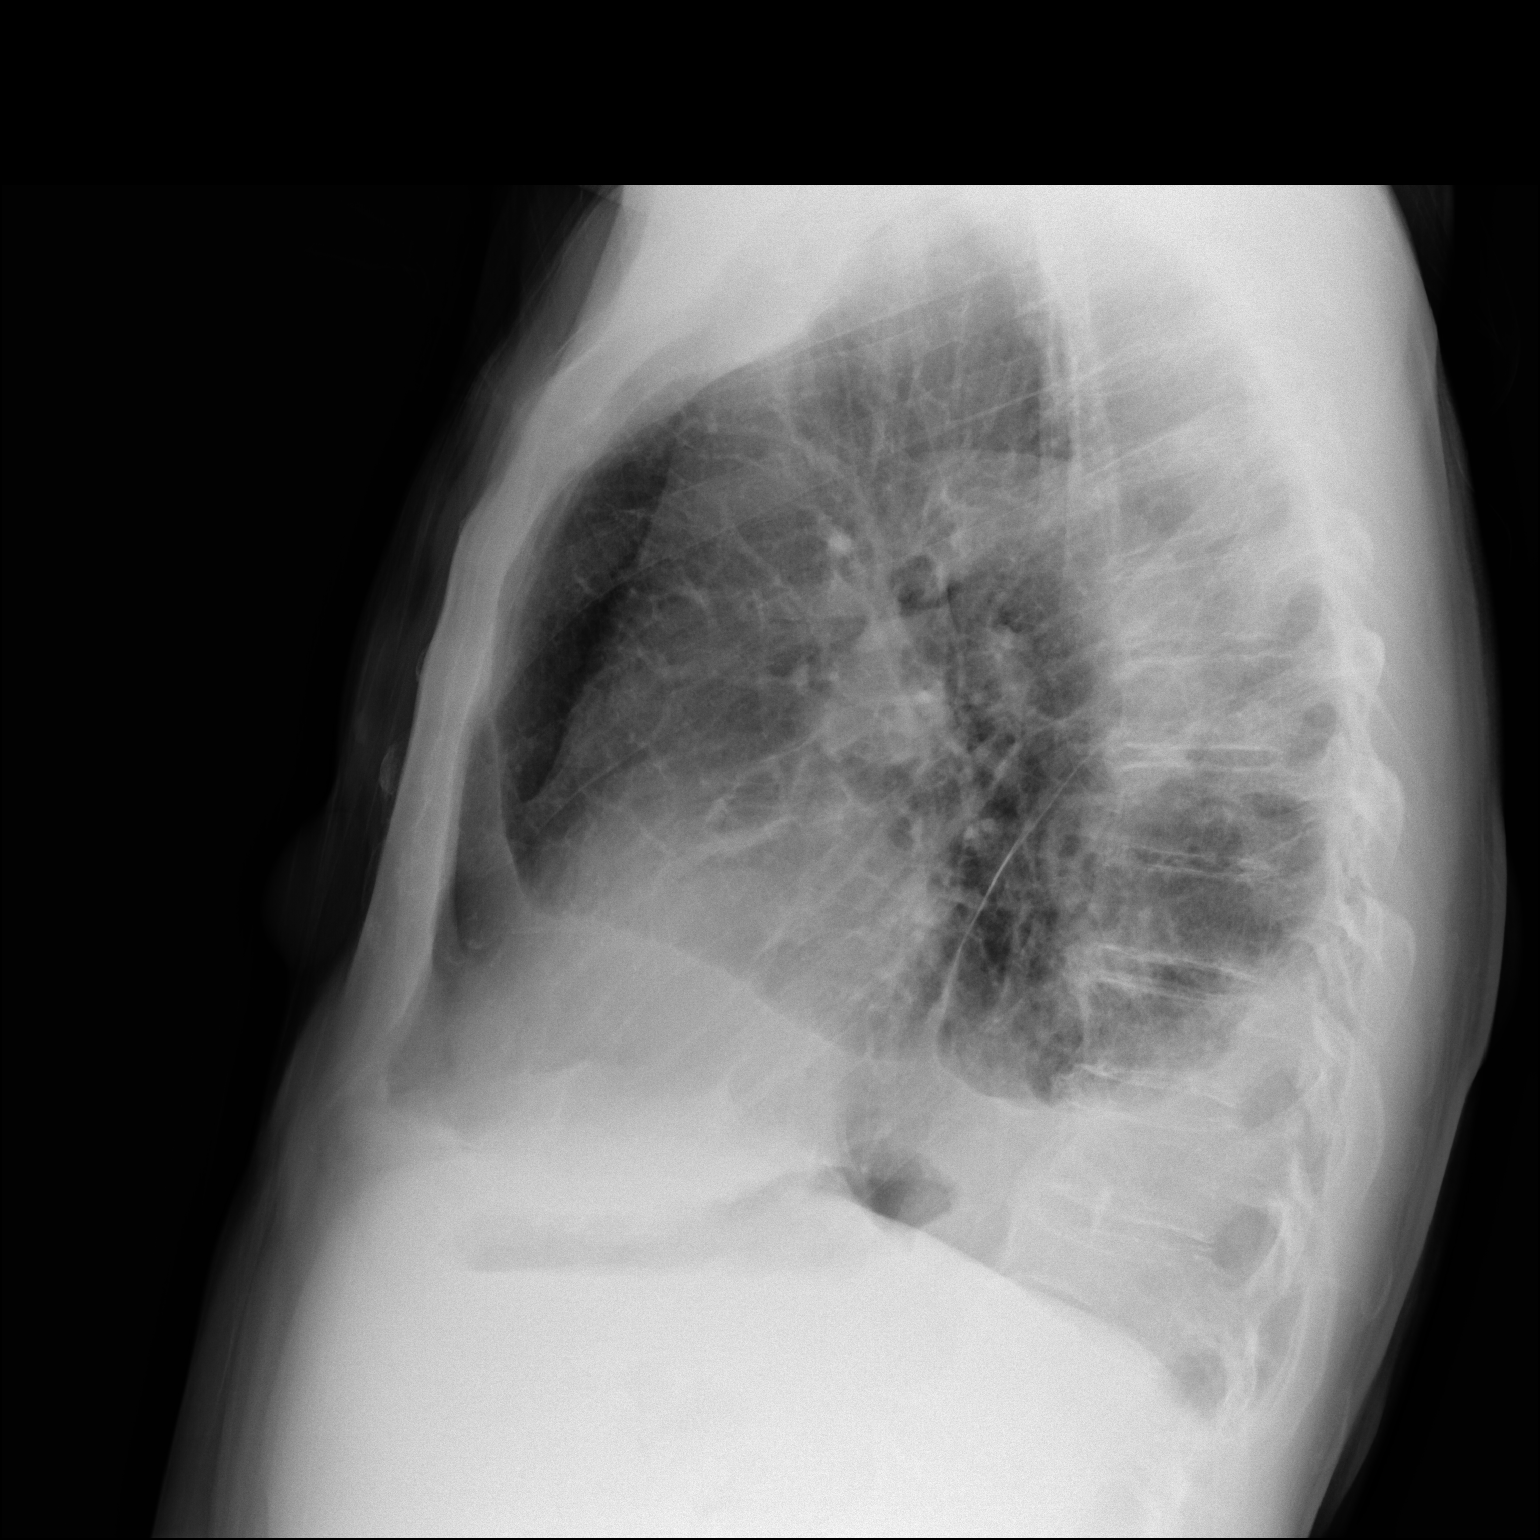

[2 of 2 positions shown; findings below may reference images not displayed]

FINDINGS: Opacity remains at the left lung base consistent with volume loss
and possible loculated effusion. Right lung is clear. Heart size is
stable. No bony abnormality is noted.
IMPRESSION: 1. Suboptimal aeration of the left lung with atelectasis at the lung
lung base and probable effusion at the left lung base which may be
loculated.
2. The right lung is clear.

## 2017-12-03 NOTE — Progress Notes (Signed)
[address]       [place], [address]             [phone]     HPI: Mr. Dan Salazar returns for a scheduled postoperative follow-up visit  Dan Salazar is a 57 year old man with a past medical history of stage I chronic kidney disease and essential hypertension.  He was admitted to the hospital in April with probable aspiration pneumonia and septic shock.  He had complete whiteout of his left chest.  CT showed complete occlusion of his left mainstem bronchus.  I did bronchoscopy and left VATS for decortication on Dan Salazar.  No endobronchial lesions were seen but there was a lot of edema in the airways.  He required a couple of more bronchoscopies but eventually improved with antibiotics and was discharged on 11/14/2017.  He feels well.  He is not having any significant incisional pain.  He denies shortness of breath.  He has completed his antibiotics.  He is not having any fevers or chills.  Past Medical History:  Diagnosis Date  . Dyspnea   . Hypertension     Current Outpatient Medications  Medication Sig Dispense Refill  . ipratropium-albuterol (DUONEB) 0.5-2.5 (3) MG/3ML SOLN Take 3 mLs by nebulization every 6 (six) hours as needed. 360 mL 0  . lisinopril (PRINIVIL,ZESTRIL) 40 MG tablet Take 20 mg by mouth daily.   3  . tamsulosin (FLOMAX) 0.4 MG CAPS capsule Take 0.4 mg by mouth daily.    Marland Kitchen thiamine (VITAMIN B-1) 100 MG tablet Take 1 tablet (100 mg total) by mouth daily. 30 tablet 0  . vitamin B-12 (CYANOCOBALAMIN) 100 MCG tablet Take 1 tablet (100 mcg total) by mouth daily. 100 tablet 3  . traMADol (ULTRAM) 50 MG tablet Take 1 tablet (50 mg total) by mouth every 12 (twelve) hours as needed for moderate pain. (Patient not taking: Reported on 12/03/2017) 15 tablet 0   No current facility-administered medications for this visit.     Physical Exam: BP 140/82 (BP Location: Right Arm, Patient Position: Sitting, Cuff Size: Normal)   Pulse (!) 48   Resp 16   Ht  6\' 2"  (1.88 m)   Wt 180 lb (81.6 kg)   SpO2 97% Comment: ON RA  BMI 23.40 kg/m  57 year old man in no acute distress No cervical or subclavicular adenopathy Cardiac regular rate and rhythm Lungs absent breath sounds left base, otherwise clear, no wheezing Incisions healing well   Diagnostic Tests: CHEST - 2 VIEW  COMPARISON:  Portable chest x-ray of 11/12/2017 and CT chest of 10/31/2017  FINDINGS: Opacity remains at the left lung base consistent with volume loss and possible loculated effusion. Right lung is clear. Heart size is stable. No bony abnormality is noted.  IMPRESSION: 1. Suboptimal aeration of the left lung with atelectasis at the lung lung base and probable effusion at the left lung base which may be loculated. 2. The right lung is clear.   Electronically Signed   By: Ivar Drape M.D.   On: 12/03/2017 10:12 I personally reviewed the chest x-ray images and concur with the findings noted above.  Impression: Dan Salazar is a 57 year old gentleman who had a bronchoscopy and left VATS for decortication for an empyema about a month ago.  He has now completed 4 weeks of antibiotics and appears to be doing well.  His no significant incisional pain.  He denies cough, fevers, chills, and shortness of breath.  His chest x-ray does  show some residual opacity at the left base.  There is likely some remaining consolidated lung and loculated fluid.  This is not an unusual appearance post decortication.  I do think we need a follow-up CT after this is all cleared up to make sure there is no underlying lung lesion.  I recommended we do that in about 2 months.  Plan: Return in 2 months with CT chest  Call if you develop fever, chills, chest pain, or shortness of breath.   Melrose Nakayama, MD [facility] [phone]

## 2017-12-11 ENCOUNTER — Inpatient Hospital Stay (INDEPENDENT_AMBULATORY_CARE_PROVIDER_SITE_OTHER): Payer: Self-pay | Admitting: Nurse Practitioner

## 2018-01-08 ENCOUNTER — Other Ambulatory Visit: Payer: Self-pay | Admitting: *Deleted

## 2018-01-08 DIAGNOSIS — J869 Pyothorax without fistula: Secondary | ICD-10-CM

## 2018-02-11 ENCOUNTER — Encounter: Payer: Self-pay | Admitting: Thoracic Surgery (Cardiothoracic Vascular Surgery)

## 2018-02-11 ENCOUNTER — Ambulatory Visit (INDEPENDENT_AMBULATORY_CARE_PROVIDER_SITE_OTHER): Payer: Self-pay | Admitting: Thoracic Surgery (Cardiothoracic Vascular Surgery)

## 2018-02-11 ENCOUNTER — Ambulatory Visit
Admission: RE | Admit: 2018-02-11 | Discharge: 2018-02-11 | Disposition: A | Discharge status: Home / Self Care | Payer: Self-pay | Source: Ambulatory Visit | Attending: Thoracic Surgery (Cardiothoracic Vascular Surgery) | Admitting: Thoracic Surgery (Cardiothoracic Vascular Surgery)

## 2018-02-11 ENCOUNTER — Other Ambulatory Visit: Payer: Self-pay

## 2018-02-11 VITALS — BP 103/69 | HR 64 | Resp 16 | Ht 74.0 in | Wt 171.2 lb

## 2018-02-11 DIAGNOSIS — J869 Pyothorax without fistula: Secondary | ICD-10-CM

## 2018-02-11 DIAGNOSIS — J90 Pleural effusion, not elsewhere classified: Secondary | ICD-10-CM

## 2018-02-11 IMAGING — CT CT CHEST W/O CM
2 of 4 series · 15 of 36 positions shown, 18 images · non-contrast
Comparison: CT [DATE]

CLINICAL DATA: Cough, pneumonia and empyema.

EXAM:
CT CHEST WITHOUT CONTRAST
TECHNIQUE: Multidetector CT imaging of the chest was performed following the
standard protocol without IV contrast.

[Series 2: chest 2.00 br40 s3 ax · axial · 0.68mm/px · z∈[+1435,+1737]mm · 12 of 179 slices shown, 15 images]
[im 14/179  mediastinal]
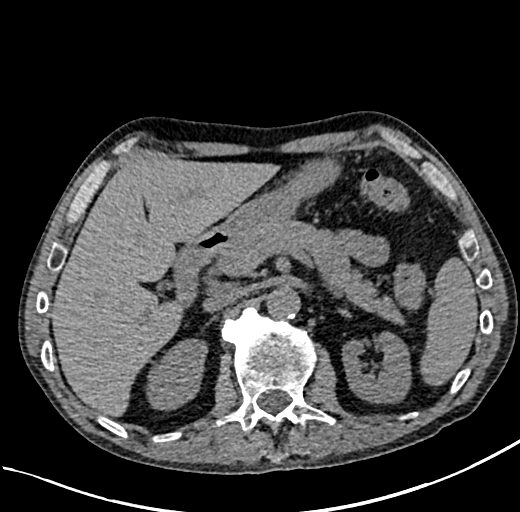
[im 14/179  lung]
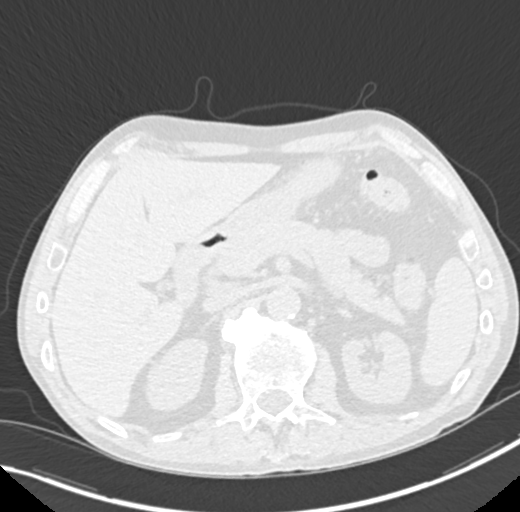
[im 28/179  lung]
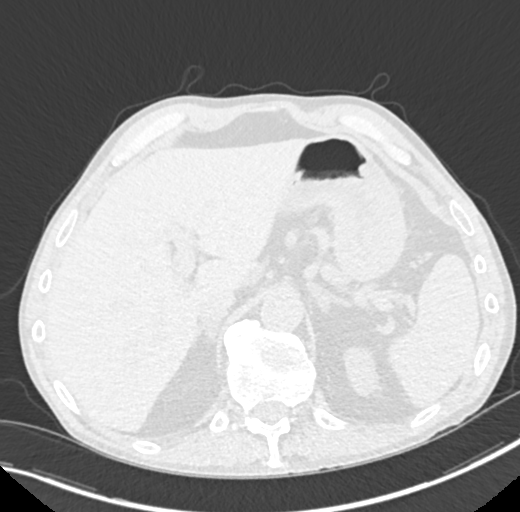
[im 42/179  lung]
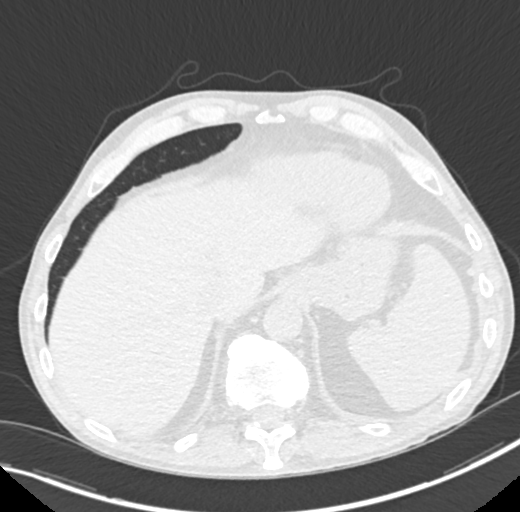
[im 55/179  lung]
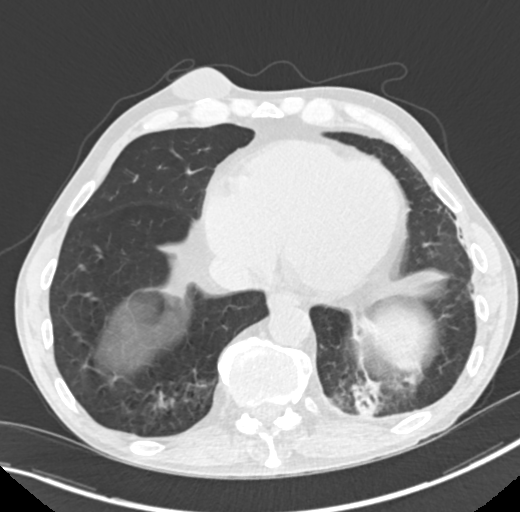
[im 69/179  mediastinal]
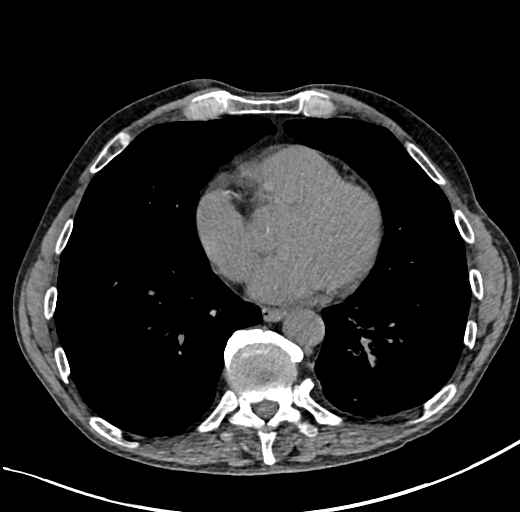
[im 69/179  lung]
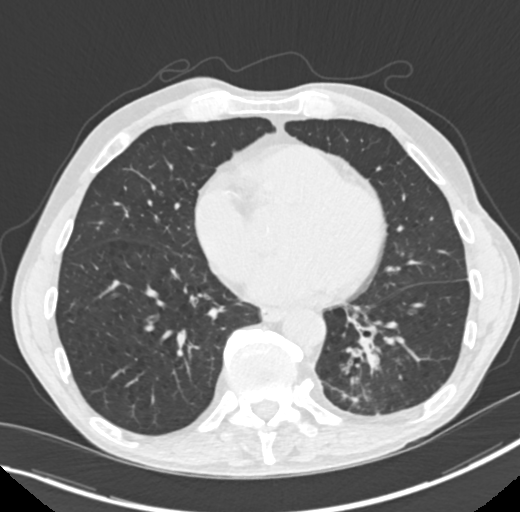
[im 83/179  lung]
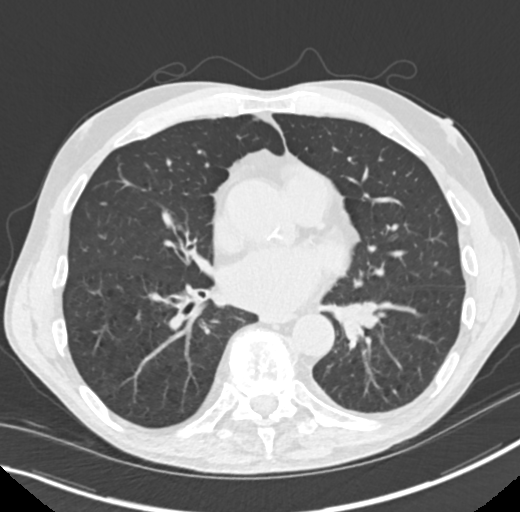
[im 96/179  lung]
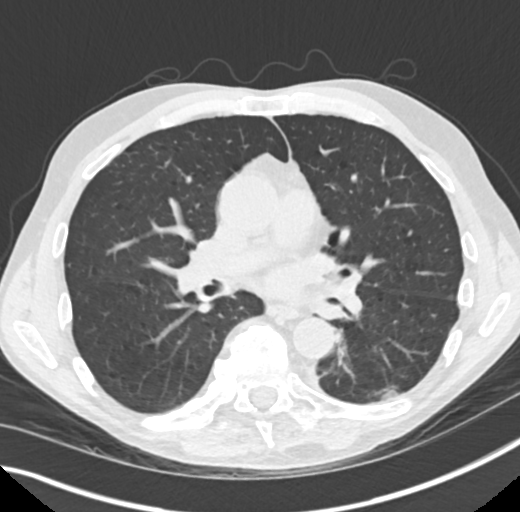
[im 110/179  lung]
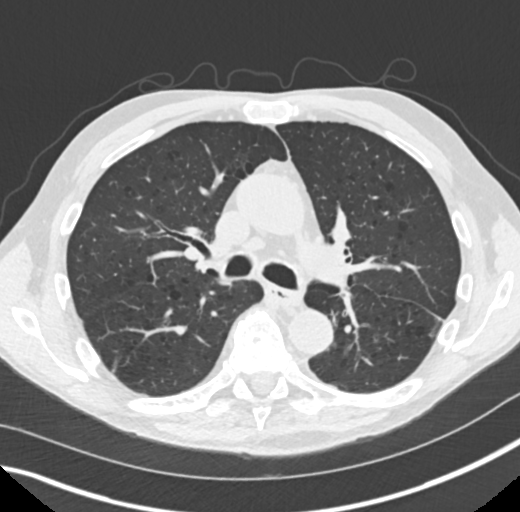
[im 124/179  mediastinal]
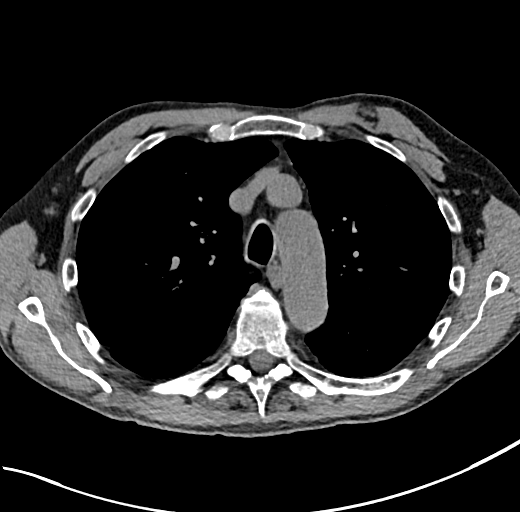
[im 124/179  lung]
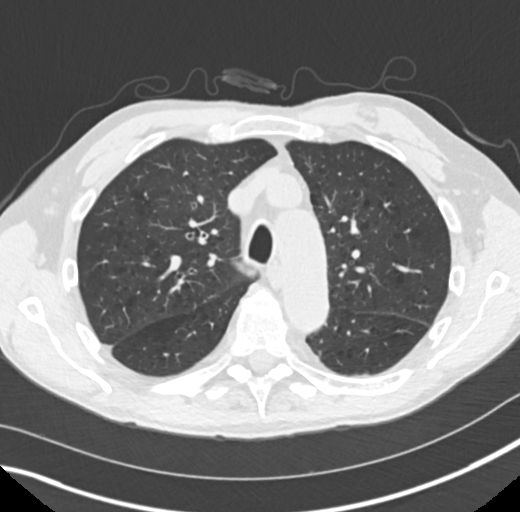
[im 137/179  lung]
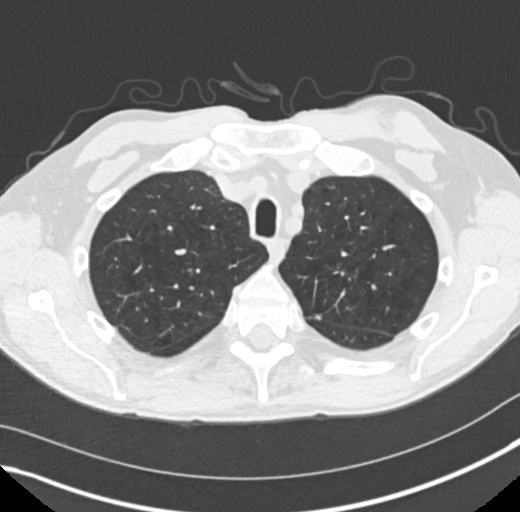
[im 151/179  lung]
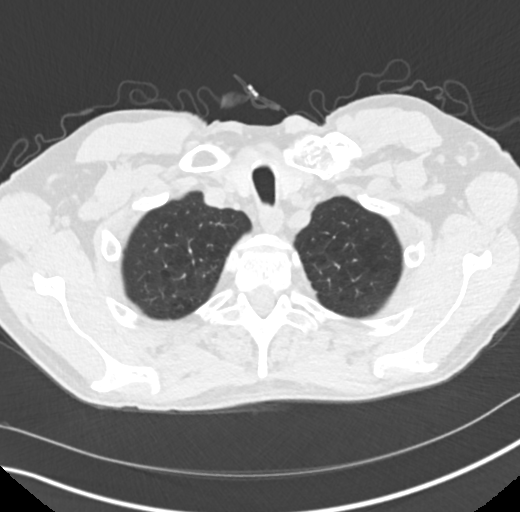
[im 165/179  lung]
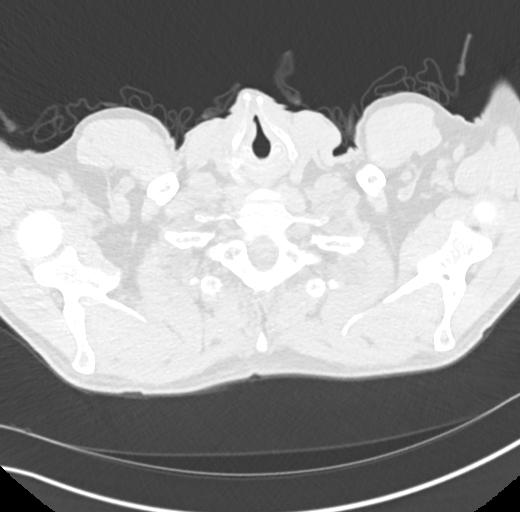

[Series 4: chest 2.00 br40 s3 cor · coronal · 0.69mm/px · 3 of 172 slices shown]
[im 35/172  lung]
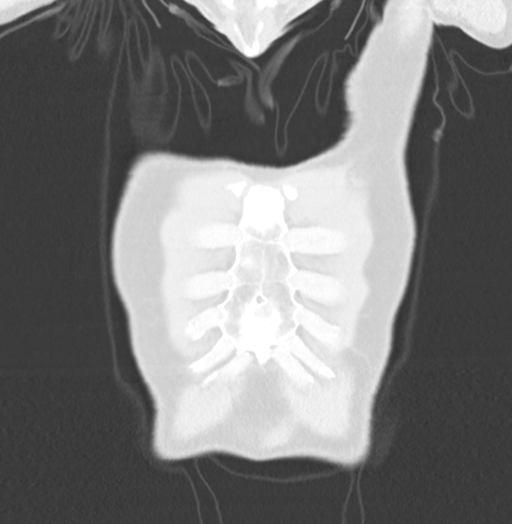
[im 69/172  lung]
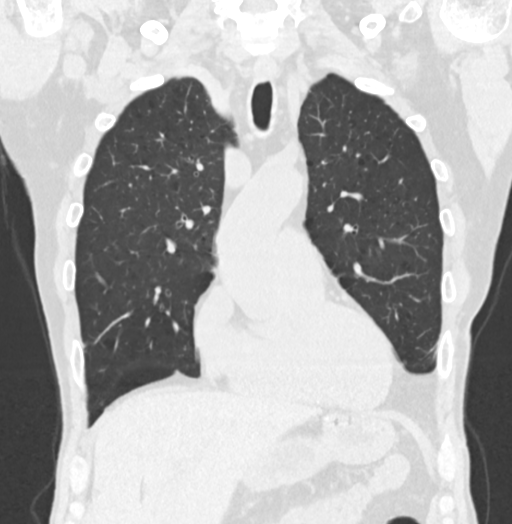
[im 103/172  lung]
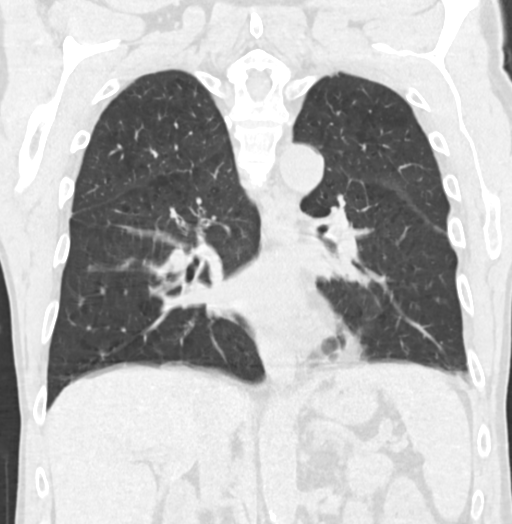

[15 of 36 positions shown; findings below may reference images not displayed]

FINDINGS: Cardiovascular: Pain Coronary artery calcification and aortic
atherosclerotic calcification.

Mediastinum/Nodes: No axillary or supraclavicular adenopathy. No
mediastinal hilar adenopathy. No pericardial effusion. Esophagus
number

Lungs/Pleura: Complete resolution of the loculated pleural fluid
collections in the LEFT hemithorax. Re-expansion of the LEFT lower
lobe with only residual

Bronchial thickening in the LEFT lower lobe which extends to the
pleural surface (image 112/8. Mild nodularity at the pleural surface
remains.. There is mild mucous plugging in the LEFT lower lobe at
site of prior atelectasis.

Mild branching nodular pattern in the RIGHT lower lobe (image 119/8
at the apex pattern.

There is centrilobular emphysema throughout the lungs.

Upper Abdomen: Limited view of the liver, kidneys, pancreas are
unremarkable. Normal adrenal glands.

Musculoskeletal: No aggressive osseous lesion Degenerative
osteophytosis of the spine.
IMPRESSION: 1. Complete resolution of the loculated pleural fluid collections in
the LEFT hemithorax.
2. Re-expansion of the LEFT lower lobe with residual band of
peribronchial linear thickening which extends the pleural surface.
Mild mucous plugging in the lower lobe bronchi.
3. Mild branching pattern in the RIGHT lower lobe consistent with
acute mild pulmonary infection.

## 2018-02-11 NOTE — Progress Notes (Signed)
[address]       [place] [address]             [phone]     HPI: Mr. Milewski returns for scheduled follow-up visit.  Mr. masterson is a 57 year old man with a past history of stage I chronic kidney disease and essential hypertension.  He also has recently been diagnosed with dementia although was not able to tell me any more specifics.  He was admitted in April 2019 with aspiration pneumonia and septic shock.  He had white out of his left chest.  He had complete occlusion of his left mainstem bronchus.  I did bronchoscopy and left VATS for decortication on 11/03/2017.  There was some extrinsic compression of his lower lobe bronchi but no endobronchial mass lesions were seen.  I saw him in the office on 12/03/2017.  He was doing well at that time.  He had completed his antibiotics.  Today he says that he is not having any incisional pain.  He still has a cough which is occasionally productive.  He has not had any fevers or chills.  He saw a neurologist recently.  Per his friend who is his power of attorney, he was diagnosed with dementia.    Past Medical History:  Diagnosis Date  . Dyspnea   . Hypertension     Current Outpatient Medications  Medication Sig Dispense Refill  . lisinopril (PRINIVIL,ZESTRIL) 40 MG tablet Take 20 mg by mouth daily.   3  . tamsulosin (FLOMAX) 0.4 MG CAPS capsule Take 0.4 mg by mouth daily.    Marland Kitchen thiamine (VITAMIN B-1) 100 MG tablet Take 1 tablet (100 mg total) by mouth daily. 30 tablet 0  . vitamin B-12 (CYANOCOBALAMIN) 100 MCG tablet Take 1 tablet (100 mcg total) by mouth daily. 100 tablet 3  . ipratropium-albuterol (DUONEB) 0.5-2.5 (3) MG/3ML SOLN Take 3 mLs by nebulization every 6 (six) hours as needed. (Patient not taking: Reported on 02/11/2018) 360 mL 0   No current facility-administered medications for this visit.     Physical Exam BP 103/69 (BP Location: Left Arm, Patient Position: Sitting, Cuff Size: Large)   Pulse 64    Resp 16   Ht 6\' 2"  (1.88 m)   Wt 171 lb 3.2 oz (77.7 kg)   SpO2 96% Comment: RA  BMI 21.57 kg/m  57 year old man in no acute distress Alert, no focal motor deficits Lungs clear with equal breath sounds bilaterally Cardiac regular rate and rhythm  Diagnostic Tests: CT CHEST WITHOUT CONTRAST  TECHNIQUE: Multidetector CT imaging of the chest was performed following the standard protocol without IV contrast.  COMPARISON:  CT 10/31/2017  FINDINGS: Cardiovascular: Pain Coronary artery calcification and aortic atherosclerotic calcification.  Mediastinum/Nodes: No axillary or supraclavicular adenopathy. No mediastinal hilar adenopathy. No pericardial effusion. Esophagus number  Lungs/Pleura: Complete resolution of the loculated pleural fluid collections in the LEFT hemithorax. Re-expansion of the LEFT lower lobe with only residual  Bronchial thickening in the LEFT lower lobe which extends to the pleural surface (image 112/8. Mild nodularity at the pleural surface remains. There is mild mucous plugging in the LEFT lower lobe at site of prior atelectasis.  Mild branching nodular pattern in the RIGHT lower lobe (image 119/8 at the apex pattern.  There is centrilobular emphysema throughout the lungs.  Upper Abdomen: Limited view of the liver, kidneys, pancreas are unremarkable. Normal adrenal glands.  Musculoskeletal: No aggressive osseous lesion Degenerative osteophytosis of the spine.  IMPRESSION:  1. Complete resolution of the loculated pleural fluid collections in the LEFT hemithorax. 2. Re-expansion of the LEFT lower lobe with residual band of peribronchial linear thickening which extends the pleural surface. Mild mucous plugging in the lower lobe bronchi. 3. Mild branching pattern in the RIGHT lower lobe consistent with acute mild pulmonary infection.   Electronically Signed   By: Suzy Bouchard M.D.   On: 02/11/2018 12:41 I personally reviewed the  CT images and concur with the findings noted above  Impression: Mr. Courville is a 57 year old gentleman who was admitted in April with aspiration pneumonia complicated by an empyema.  He is now about 3 months out from surgery with no residual effects from that.  His CT today showed complete resolution of his pleural effusion.  He does still have some scarring in the left lower lobe and has some fullness in the area where the lower lobe bronchi bifurcate and I suspect that some residual adenopathy.  I do want to repeat another CT in about 6 months to make sure that that improves or the very least remains stable.  Plan: Follow-up in 6 months with CT chest  Melrose Nakayama, MD [facility] [phone]

## 2018-04-24 ENCOUNTER — Ambulatory Visit: Payer: Self-pay | Admitting: General Surgery

## 2018-04-24 NOTE — H&P (Signed)
Dan Salazar Documented: 04/24/2018 1:32 PM Location: [facility] Patient #: [id number] DOB: Feb 01, 1961 Married / Language: English / Race: White Male   History of Present Illness Dan Hiss M. Dan Bugh MD; 04/24/2018 1:57 PM) The patient is a 57 year old male who presents with an inguinal hernia. He comes in with complaints of a right-sided inguinal hernia. I initially met him about a year and half ago when he presented with a massive left inguinal hernia. He underwent open repair of a large left direct inguinal hernia with mesh in October 2018. We did a diagnostic laparoscopy and at that time I noticed he had an indirect right inguinal hernia. He was asymptomatic at the time. He comes in today accompanied by his power of attorney due to his dementia complaining of right groin pain. It is been present for months or so. The pain is intermittent. There is no particular pattern. The bulge comes and goes. He denies any dysuria or hematuria. He had a 2 week hospitalization in April of this year for aspiration pneumonia and sepsis and right lung collapse requiring VATS. He has recovered. He went to [place] to be with his mother for a few weeks in May and developed loose stool. He presented to [facility] and was found to have C. difficile. He had no insurance coverage at that time so they gave him Flagyl. He had a follow-up down there with ongoing loose stools and to give him another round of Flagyl since he could not afford oral vancomycin. He denies any fevers or chills. But he does have ongoing loose stool. It is unchanged from the summer. He reports good appetite and drinking plenty liquids. He denies any chest pain or chest pressure. He denies any shortness of breath. He lives alone. He does not smoke.   Problem List/Past Medical Dan Hiss M. Dan Pulling, MD; 04/24/2018 1:57 PM) POSTOP CHECK (Z09)  HYPERTENSION, ESSENTIAL (I10)  RIGHT INGUINAL HERNIA  (K40.90)  COGNITIVE DECLINE (R41.89)  C. DIFFICILE DIARRHEA (J81.19)   Past Surgical History Dan Hiss M. Dan Pulling, MD; 04/24/2018 1:57 PM) No pertinent past surgical history   Diagnostic Studies History Dan Hiss M. Dan Pulling, MD; 04/24/2018 1:57 PM) Colonoscopy  never  Allergies Dan Salazar, [place]; 04/24/2018 1:33 PM) Olmesartan-amLODIPine-HCTZ  Allergies Reconciled   Medication History Dan Hiss M. Dan Pulling, MD; 04/24/2018 1:57 PM) Tamsulosin HCl (0.'4MG'$  Capsule, Oral) Active. Vitamin B-12 (100MCG Tablet, Oral) Active. Medications Reconciled Vancomycin HCl ('125MG'$  Capsule, 1 (one) Oral every six hours, Taken starting 04/24/2018) Active. Pain Relief PM Extra Strength (Oral) Specific strength unknown - Active. Lisinopril ('40MG'$  Tablet, Oral) Active.  Social History Dan Hiss M. Dan Pulling, MD; 04/24/2018 1:57 PM) Alcohol use  Occasional alcohol use. Caffeine use  Carbonated beverages. Illicit drug use  Recently quit drug use. Tobacco use  Never smoker.  Family History Dan Hiss M. Dan Pulling, MD; 04/24/2018 1:57 PM) Breast Cancer  Mother. Depression  Daughter. Diabetes Mellitus  Mother. Prostate Cancer  Father.    Review of Systems Dan Hiss M. Dan Ogden MD; 04/24/2018 1:55 PM) All other systems negative  Vitals Dan Salazar RMA; 04/24/2018 1:33 PM) 04/24/2018 1:32 PM Weight: 181 lb Height: 74in Body Surface Area: 2.08 m Body Mass Index: 23.24 kg/m  Temp.: 98.21F  Pulse: 97 (Regular)  BP: 138/80 (Sitting, Left Arm, Standard)       Physical Exam Dan Hiss M. Dan Driskill MD; 04/24/2018 1:54 PM) General Mental Status-Alert. General Appearance-Consistent with stated age. Hydration-Well hydrated. Voice-Normal.  Head and Neck Head-normocephalic, atraumatic with no lesions or palpable masses. Trachea-midline.  Thyroid Gland Characteristics - normal size and consistency.  Eye Eyeball - Bilateral-Extraocular movements intact. Sclera/Conjunctiva - Bilateral-No scleral  icterus.  ENMT Note: ears - normal ext ears mouth - lips intact   Chest and Lung Exam Chest and lung exam reveals -quiet, even and easy respiratory effort with no use of accessory muscles and on auscultation, normal breath sounds, no adventitious sounds and normal vocal resonance. Inspection Chest Wall - Normal. Back - normal.  Breast - Did not examine.  Cardiovascular Cardiovascular examination reveals -normal heart sounds, regular rate and rhythm with no murmurs and normal pedal pulses bilaterally.  Abdomen Inspection Inspection of the abdomen reveals - No Hernias. Skin - Scar - no surgical scars. Palpation/Percussion Palpation and Percussion of the abdomen reveal - Soft, Non Tender, No Rebound tenderness, No Rigidity (guarding) and No hepatosplenomegaly. Auscultation Auscultation of the abdomen reveals - Bowel sounds normal.  Male Genitourinary Note: well healed L inguinal incision, no hernia on left; obvious Rt groin bulge - reducible. Soft, nontender   Peripheral Vascular Upper Extremity Palpation - Pulses bilaterally normal.  Neurologic Neurologic evaluation reveals -alert and oriented x 3 with no impairment of recent or remote memory. Mental Status-Normal.  Neuropsychiatric The patient's mood and affect are described as -normal. Judgment and Insight-insight is appropriate concerning matters relevant to self.  Musculoskeletal Normal Exam - Left-Upper Extremity Strength Normal and Lower Extremity Strength Normal. Normal Exam - Right-Upper Extremity Strength Normal and Lower Extremity Strength Normal.  Lymphatic Head & Neck  General Head & Neck Lymphatics: Bilateral - Description - Normal. Axillary - Did not examine. Femoral & Inguinal - Did not examine.    Assessment & Plan Dan Hiss M. Dan Clawson MD; 04/24/2018 1:55 PM) RIGHT INGUINAL HERNIA (K40.90) Impression: We discussed the etiology of inguinal hernias. We discussed the signs & symptoms of  incarceration & strangulation. We discussed non-operative and operative management.  The patient has elected to proceed with OPEN REPAIR OF RIGHT INGUINAL HERNIA WITH MESH, once c diff diarrhea has resolved.   I described the procedure in detail. The patient was given educational material. We discussed the risks and benefits including but not limited to bleeding, infection, chronic inguinal pain, nerve entrapment, hernia recurrence, mesh complications, hematoma formation, urinary retention, injury to the testicles or the ovaries, numbness in the groin, blood clots, injury to the surrounding structures, and anesthesia risk. We also discussed the typical post operative recovery course, including no heavy lifting for 4-6 weeks. I explained that the likelihood of improvement of their symptoms is good. Current Plans Pt Education - Pamphlet Given - Hernia Surgery: discussed with patient and provided information. You are being scheduled for surgery- Our schedulers will call you.  You should hear from our office's scheduling department within 5 working days about the location, date, and time of surgery. We try to make accommodations for patient's preferences in scheduling surgery, but sometimes the OR schedule or the surgeon's schedule prevents [place] from making those accommodations.  If you have not heard from our office [phone]) in 5 working days, call the office and ask for your surgeon's nurse.  If you have other questions about your diagnosis, plan, or surgery, call the office and ask for your surgeon's nurse.  C. DIFFICILE DIARRHEA (A04.72) Impression: Since he has ongoing loose stool that is unchanged from a few months ago with a positive C. difficile test that I found [employer] everywhere I presume he has ongoing C. difficile. When he was in [place] he has no drug coverage and  so therefore they tried Flagyl. At this point we will do a 10 day course of oral vancomycin. We will need to wait  to do his hernia repair until after his diarrhea has resolved Current Plans Started Vancomycin HCl 125 MG Oral Capsule, 1 (one) Capsule every six hours, #40, 10 days starting 04/24/2018, No Refill. COGNITIVE DECLINE (R41.89)  Leighton Ruff. Dan Pulling, MD, FACS General, Bariatric, & Minimally Invasive Surgery [facility], [place]

## 2018-05-14 NOTE — Pre-Procedure Instructions (Signed)
Dan Salazar  05/14/2018      [id number] [place], [place] - [address] [place] [address] Phone: [phone] Fax: [place] [address] [place] [address] Phone: [phone] Fax: [phone]    Your procedure is scheduled on October 25.  Report to [facility] Admitting at 1:00 P.M.  Call this number if you have problems the morning of surgery:  [phone]   Remember:  Do not eat or drink after midnight.  You may drink clear liquids until 12:00 PM .  Clear liquids allowed are: Water, Juice (non-citric and without pulp), Clear Tea, Black Coffee only and Gatorade    Take these medicines the morning of surgery with A SIP OF WATER: NONE, you may use your Duoneb nebulizer if needed  7 days prior to surgery STOP taking any Aspirin(unless otherwise instructed by your surgeon), Aleve, Naproxen, Ibuprofen, Motrin, Advil, Goody's, BC's, all herbal medications, fish oil, and all vitamins     Do not wear jewelry, make-up or nail polish.  Do not wear lotions, powders, or perfumes, or deodorant.  Do not shave 48 hours prior to surgery.  Men may shave face and neck.  Do not bring valuables to the hospital.  [facility] is not responsible for any belongings or valuables.  Contacts, dentures or bridgework may not be worn into surgery.  Leave your suitcase in the car.  After surgery it may be brought to your room.  For patients admitted to the hospital, discharge time will be determined by your treatment team.  Patients discharged the day of surgery will not be allowed to drive home.   Special instructions:    [facility]- Preparing For Surgery  Before surgery, you can play an important role. Because skin is not sterile, your skin needs to be as free of germs as possible. You can reduce the number of germs on  your skin by washing with CHG (chlorahexidine gluconate) Soap before surgery.  CHG is an antiseptic cleaner which kills germs and bonds with the skin to continue killing germs even after washing.    Oral Hygiene is also important to reduce your risk of infection.  Remember - BRUSH YOUR TEETH THE MORNING OF SURGERY WITH YOUR REGULAR TOOTHPASTE  Please do not use if you have an allergy to CHG or antibacterial soaps. If your skin becomes reddened/irritated stop using the CHG.  Do not shave (including legs and underarms) for at least 48 hours prior to first CHG shower. It is OK to shave your face.  Please follow these instructions carefully.   1. Shower the NIGHT BEFORE SURGERY and the MORNING OF SURGERY with CHG.   2. If you chose to wash your hair, wash your hair first as usual with your normal shampoo.  3. After you shampoo, rinse your hair and body thoroughly to remove the shampoo.  4. Use CHG as you would any other liquid soap. You can apply CHG directly to the skin and wash gently with a scrungie or a clean washcloth.   5. Apply the CHG Soap to your body ONLY FROM THE NECK DOWN.  Do not use on open wounds or open sores. Avoid contact with your eyes, ears, mouth and genitals (private parts). Wash Face and genitals (private parts)  with your normal soap.  6. Wash thoroughly, paying special attention  to the area where your surgery will be performed.  7. Thoroughly rinse your body with warm water from the neck down.  8. DO NOT shower/wash with your normal soap after using and rinsing off the CHG Soap.  9. Pat yourself dry with a CLEAN TOWEL.  10. Wear CLEAN PAJAMAS to bed the night before surgery, wear comfortable clothes the morning of surgery  11. Place CLEAN SHEETS on your bed the night of your first shower and DO NOT SLEEP WITH PETS.    Day of Surgery:  Do not apply any deodorants/lotions.  Please wear clean clothes to the hospital/surgery center.   Remember to brush your teeth  WITH YOUR REGULAR TOOTHPASTE.    Please read over the following fact sheets that you were given. Coughing and Deep Breathing and Surgical Site Infection Prevention

## 2018-05-15 ENCOUNTER — Encounter (HOSPITAL_COMMUNITY)
Admission: RE | Admit: 2018-05-15 | Discharge: 2018-05-15 | Disposition: A | Discharge status: Home / Self Care | Payer: Medicaid Other | Source: Ambulatory Visit | Attending: General Surgery | Admitting: General Surgery

## 2018-05-15 ENCOUNTER — Encounter (HOSPITAL_COMMUNITY): Payer: Self-pay

## 2018-05-15 DIAGNOSIS — Z01812 Encounter for preprocedural laboratory examination: Secondary | ICD-10-CM | POA: Diagnosis present

## 2018-05-15 HISTORY — DX: Unspecified dementia, unspecified severity, without behavioral disturbance, psychotic disturbance, mood disturbance, and anxiety: F03.90

## 2018-05-15 HISTORY — DX: Restless legs syndrome: G25.81

## 2018-05-15 HISTORY — DX: Enterocolitis due to Clostridium difficile, not specified as recurrent: A04.72

## 2018-05-15 LAB — CBC WITH DIFFERENTIAL/PLATELET
Abs Immature Granulocytes: 0.01 10*3/uL (ref 0.00–0.07)
BASOS ABS: 0 10*3/uL (ref 0.0–0.1)
BASOS PCT: 1 %
EOS ABS: 0 10*3/uL (ref 0.0–0.5)
Eosinophils Relative: 1 %
HCT: 40.7 % (ref 39.0–52.0)
Hemoglobin: 12.7 g/dL — ABNORMAL LOW (ref 13.0–17.0)
IMMATURE GRANULOCYTES: 0 %
Lymphocytes Relative: 20 %
Lymphs Abs: 1.3 10*3/uL (ref 0.7–4.0)
MCH: 28.9 pg (ref 26.0–34.0)
MCHC: 31.2 g/dL (ref 30.0–36.0)
MCV: 92.5 fL (ref 80.0–100.0)
Monocytes Absolute: 0.3 10*3/uL (ref 0.1–1.0)
Monocytes Relative: 5 %
NEUTROS PCT: 73 %
NRBC: 0 % (ref 0.0–0.2)
Neutro Abs: 4.9 10*3/uL (ref 1.7–7.7)
PLATELETS: 185 10*3/uL (ref 150–400)
RBC: 4.4 MIL/uL (ref 4.22–5.81)
RDW: 12.6 % (ref 11.5–15.5)
WBC: 6.5 10*3/uL (ref 4.0–10.5)

## 2018-05-15 LAB — BASIC METABOLIC PANEL
ANION GAP: 9 (ref 5–15)
BUN: 11 mg/dL (ref 6–20)
CALCIUM: 8.5 mg/dL — AB (ref 8.9–10.3)
CO2: 25 mmol/L (ref 22–32)
CREATININE: 1.21 mg/dL (ref 0.61–1.24)
Chloride: 111 mmol/L (ref 98–111)
GFR calc non Af Amer: >60 mL/min (ref >60)
Glucose, Bld: 98 mg/dL (ref 70–99)
Potassium: 3 mmol/L — ABNORMAL LOW (ref 3.5–5.1)
SODIUM: 145 mmol/L (ref 135–145)

## 2018-05-15 MED ORDER — CHLORHEXIDINE GLUCONATE CLOTH 2 % EX PADS: 6.0000 | MEDICATED_PAD | Freq: Once | CUTANEOUS | Status: DC

## 2018-05-22 NOTE — H&P (Signed)
Dan Salazar Documented: 04/24/2018 1:32 PM Location: [facility] Patient #: [id number] DOB: [birth year] Married / Language: English / Race: White Male   History of Present Illness Dan Salazar M. Dan [name] MD; 04/24/2018 1:57 PM) The patient is a 56 year old male who presents with an inguinal hernia. He comes in with complaints of a right-sided inguinal hernia. I initially met him about a year and half ago when he presented with a massive left inguinal hernia. He underwent open repair of a large left direct inguinal hernia with mesh in October 2018. We did a diagnostic laparoscopy and at that time I noticed he had an indirect right inguinal hernia. He was asymptomatic at the time. He comes in today accompanied by his power of attorney due to his dementia complaining of right groin pain. It is been present for months or so. The pain is intermittent. There is no particular pattern. The bulge comes and goes. He denies any dysuria or hematuria. He had a 2 week hospitalization in April of this year for aspiration pneumonia and sepsis and right lung collapse requiring VATS. He has recovered. He went to [place] to be with his mother for a few weeks in May and developed loose stool. He presented to [facility] and was found to have C. difficile. He had no insurance coverage at that time so they gave him Flagyl. He had a follow-up down there with ongoing loose stools and to give him another round of Flagyl since he could not afford oral vancomycin. He denies any fevers or chills. But he does have ongoing loose stool. It is unchanged from the summer. He reports good appetite and drinking plenty liquids. He denies any chest pain or chest pressure. He denies any shortness of breath. He lives alone. He does not smoke.   Problem List/Past Medical Dan Salazar M. Dan Pulling, MD; 04/24/2018 1:57 PM) POSTOP CHECK (Z09)  HYPERTENSION, ESSENTIAL (I10)  RIGHT INGUINAL HERNIA  (K40.90)  COGNITIVE DECLINE (R41.89)  C. DIFFICILE DIARRHEA (W10.93)   Past Surgical History Dan Salazar M. Dan Pulling, MD; 04/24/2018 1:57 PM) No pertinent past surgical history   Diagnostic Studies History Dan Salazar M. Dan Pulling, MD; 04/24/2018 1:57 PM) Colonoscopy  never  Allergies Dan Salazar, [place]; 04/24/2018 1:33 PM) Olmesartan-amLODIPine-HCTZ  Allergies Reconciled   Medication History Dan Salazar M. Dan Pulling, MD; 04/24/2018 1:57 PM) Tamsulosin HCl (0.'4MG'$  Capsule, Oral) Active. Vitamin B-12 (100MCG Tablet, Oral) Active. Medications Reconciled Vancomycin HCl ('125MG'$  Capsule, 1 (one) Oral every six hours, Taken starting 04/24/2018) Active. Pain Relief PM Extra Strength (Oral) Specific strength unknown - Active. Lisinopril ('40MG'$  Tablet, Oral) Active.  Social History Dan Salazar M. Dan Pulling, MD; 04/24/2018 1:57 PM) Alcohol use  Occasional alcohol use. Caffeine use  Carbonated beverages. Illicit drug use  Recently quit drug use. Tobacco use  Never smoker.  Family History Dan Salazar M. Dan Pulling, MD; 04/24/2018 1:57 PM) Breast Cancer  Mother. Depression  Daughter. Diabetes Mellitus  Mother. Prostate Cancer  Father.    Review of Systems Dan Salazar M. Dan Giel MD; 04/24/2018 1:55 PM) All other systems negative  Vitals Dan Salazar RMA; 04/24/2018 1:33 PM) 04/24/2018 1:32 PM Weight: 181 lb Height: 74in Body Surface Area: 2.08 m Body Mass Index: 23.24 kg/m  Temp.: 98.61F  Pulse: 97 (Regular)  BP: 138/80 (Sitting, Left Arm, Standard)       Physical Exam Dan Salazar M. [name] MD; 04/24/2018 1:54 PM) General Mental Status-Alert. General Appearance-Consistent with stated age. Hydration-Well hydrated. Voice-Normal.  Head and Neck Head-normocephalic, atraumatic with no lesions or palpable masses. Trachea-midline.  Thyroid Gland Characteristics - normal size and consistency.  Eye Eyeball - Bilateral-Extraocular movements intact. Sclera/Conjunctiva - Bilateral-No scleral  icterus.  ENMT Note: ears - normal ext ears mouth - lips intact   Chest and Lung Exam Chest and lung exam reveals -quiet, even and easy respiratory effort with no use of accessory muscles and on auscultation, normal breath sounds, no adventitious sounds and normal vocal resonance. Inspection Chest Wall - Normal. Back - normal.  Breast - Did not examine.  Cardiovascular Cardiovascular examination reveals -normal heart sounds, regular rate and rhythm with no murmurs and normal pedal pulses bilaterally.  Abdomen Inspection Inspection of the abdomen reveals - No Hernias. Skin - Scar - no surgical scars. Palpation/Percussion Palpation and Percussion of the abdomen reveal - Soft, Non Tender, No Rebound tenderness, No Rigidity (guarding) and No hepatosplenomegaly. Auscultation Auscultation of the abdomen reveals - Bowel sounds normal.  Male Genitourinary Note: well healed L inguinal incision, no hernia on left; obvious Rt groin bulge - reducible. Soft, nontender   Peripheral Vascular Upper Extremity Palpation - Pulses bilaterally normal.  Neurologic Neurologic evaluation reveals -alert and oriented x 3 with no impairment of recent or remote memory. Mental Status-Normal.  Neuropsychiatric The patient's mood and affect are described as -normal. Judgment and Insight-insight is appropriate concerning matters relevant to self.  Musculoskeletal Normal Exam - Left-Upper Extremity Strength Normal and Lower Extremity Strength Normal. Normal Exam - Right-Upper Extremity Strength Normal and Lower Extremity Strength Normal.  Lymphatic Head & Neck  General Head & Neck Lymphatics: Bilateral - Description - Normal. Axillary - Did not examine. Femoral & Inguinal - Did not examine.    Assessment & Plan Dan Salazar M. [name] MD; 04/24/2018 1:55 PM) RIGHT INGUINAL HERNIA (K40.90) Impression: We discussed the etiology of inguinal hernias. We discussed the signs & symptoms of  incarceration & strangulation. We discussed non-operative and operative management.  The patient has elected to proceed with OPEN REPAIR OF RIGHT INGUINAL HERNIA WITH MESH, once c diff diarrhea has resolved.   I described the procedure in detail. The patient was given educational material. We discussed the risks and benefits including but not limited to bleeding, infection, chronic inguinal pain, nerve entrapment, hernia recurrence, mesh complications, hematoma formation, urinary retention, injury to the testicles or the ovaries, numbness in the groin, blood clots, injury to the surrounding structures, and anesthesia risk. We also discussed the typical post operative recovery course, including no heavy lifting for 4-6 weeks. I explained that the likelihood of improvement of their symptoms is good. Current Plans Pt Education - Pamphlet Given - Hernia Surgery: discussed with patient and provided information. You are being scheduled for surgery- Our schedulers will call you.  You should hear from our office's scheduling department within 5 working days about the location, date, and time of surgery. We try to make accommodations for patient's preferences in scheduling surgery, but sometimes the OR schedule or the surgeon's schedule prevents [place] from making those accommodations.  If you have not heard from our office [phone]) in 5 working days, call the office and ask for your surgeon's nurse.  If you have other questions about your diagnosis, plan, or surgery, call the office and ask for your surgeon's nurse.  C. DIFFICILE DIARRHEA (A04.72) Impression: Since he has ongoing loose stool that is unchanged from a few months ago with a positive C. difficile test that I found [employer] everywhere I presume he has ongoing C. difficile. When he was in [place] he has no drug coverage and  so therefore they tried Flagyl. At this point we will do a 10 day course of oral vancomycin. We will need to wait  to do his hernia repair until after his diarrhea has resolved Current Plans Started Vancomycin HCl 125 MG Oral Capsule, 1 (one) Capsule every six hours, #40, 10 days starting 04/24/2018, No Refill. COGNITIVE DECLINE (R41.89)  Leighton Ruff. Dan Pulling, MD, FACS General, Bariatric, & Minimally Invasive Surgery [facility], [place]

## 2018-05-23 ENCOUNTER — Encounter (HOSPITAL_COMMUNITY): Payer: Self-pay

## 2018-05-23 ENCOUNTER — Ambulatory Visit (HOSPITAL_COMMUNITY): Payer: Medicaid Other | Admitting: Vascular Surgery

## 2018-05-23 ENCOUNTER — Encounter (HOSPITAL_COMMUNITY)
Admission: RE | Disposition: A | Discharge status: Home / Self Care | Payer: Self-pay | Source: Ambulatory Visit | Attending: General Surgery

## 2018-05-23 ENCOUNTER — Other Ambulatory Visit: Payer: Self-pay

## 2018-05-23 ENCOUNTER — Ambulatory Visit (HOSPITAL_COMMUNITY): Payer: Medicaid Other | Admitting: Anesthesiology

## 2018-05-23 ENCOUNTER — Observation Stay (HOSPITAL_COMMUNITY)
Admission: RE | Admit: 2018-05-23 | Discharge: 2018-05-24 | Disposition: A | Discharge status: Home / Self Care | Payer: Medicaid Other | Source: Ambulatory Visit | Attending: General Surgery | Admitting: General Surgery

## 2018-05-23 DIAGNOSIS — I10 Essential (primary) hypertension: Secondary | ICD-10-CM | POA: Diagnosis not present

## 2018-05-23 DIAGNOSIS — S39023A Laceration of muscle, fascia and tendon of pelvis, initial encounter: Secondary | ICD-10-CM | POA: Insufficient documentation

## 2018-05-23 DIAGNOSIS — Z888 Allergy status to other drugs, medicaments and biological substances status: Secondary | ICD-10-CM | POA: Insufficient documentation

## 2018-05-23 DIAGNOSIS — Z9889 Other specified postprocedural states: Secondary | ICD-10-CM

## 2018-05-23 DIAGNOSIS — Z8719 Personal history of other diseases of the digestive system: Secondary | ICD-10-CM

## 2018-05-23 DIAGNOSIS — K4090 Unilateral inguinal hernia, without obstruction or gangrene, not specified as recurrent: Secondary | ICD-10-CM | POA: Diagnosis not present

## 2018-05-23 DIAGNOSIS — Z79899 Other long term (current) drug therapy: Secondary | ICD-10-CM | POA: Insufficient documentation

## 2018-05-23 DIAGNOSIS — X58XXXA Exposure to other specified factors, initial encounter: Secondary | ICD-10-CM | POA: Insufficient documentation

## 2018-05-23 DIAGNOSIS — A0472 Enterocolitis due to Clostridium difficile, not specified as recurrent: Secondary | ICD-10-CM | POA: Insufficient documentation

## 2018-05-23 DIAGNOSIS — Z8042 Family history of malignant neoplasm of prostate: Secondary | ICD-10-CM | POA: Insufficient documentation

## 2018-05-23 HISTORY — PX: INGUINAL HERNIA REPAIR: SHX194

## 2018-05-23 HISTORY — PX: INSERTION OF MESH: SHX5868

## 2018-05-23 HISTORY — PX: INGUINAL HERNIA REPAIR: SUR1180

## 2018-05-23 SURGERY — REPAIR, HERNIA, INGUINAL, ADULT
Anesthesia: General | Laterality: Right

## 2018-05-23 MED ORDER — MIDAZOLAM HCL 2 MG/2ML IJ SOLN
2.0000 mg | Freq: Once | INTRAMUSCULAR | Status: AC
  Administered 2018-05-23: 1 mg via INTRAVENOUS

## 2018-05-23 MED ORDER — PANTOPRAZOLE SODIUM 40 MG PO TBEC
40.0000 mg | DELAYED_RELEASE_TABLET | Freq: Every day | ORAL | Status: DC
  Administered 2018-05-23 – 2018-05-24 (×2): 40 mg via ORAL
  Filled 2018-05-23 (×2): qty 1

## 2018-05-23 MED ORDER — LIDOCAINE 2% (20 MG/ML) 5 ML SYRINGE
INTRAMUSCULAR | Status: DC | PRN
  Administered 2018-05-23: 50 mg via INTRAVENOUS

## 2018-05-23 MED ORDER — FENTANYL CITRATE (PF) 100 MCG/2ML IJ SOLN
100.0000 ug | Freq: Once | INTRAMUSCULAR | Status: AC
  Administered 2018-05-23: 50 ug via INTRAVENOUS

## 2018-05-23 MED ORDER — FENTANYL CITRATE (PF) 100 MCG/2ML IJ SOLN
INTRAMUSCULAR | Status: AC
  Administered 2018-05-23: 50 ug via INTRAVENOUS
  Filled 2018-05-23: qty 2

## 2018-05-23 MED ORDER — VITAMIN B-1 100 MG PO TABS
100.0000 mg | ORAL_TABLET | Freq: Every day | ORAL | Status: DC
  Administered 2018-05-23 – 2018-05-24 (×2): 100 mg via ORAL
  Filled 2018-05-23 (×2): qty 1

## 2018-05-23 MED ORDER — BUPIVACAINE-EPINEPHRINE 0.25% -1:200000 IJ SOLN
INTRAMUSCULAR | Status: DC | PRN
  Administered 2018-05-23: 3 mL

## 2018-05-23 MED ORDER — LACTATED RINGERS IV SOLN
INTRAVENOUS | Status: DC | PRN
  Administered 2018-05-23: 10:00:00 via INTRAVENOUS

## 2018-05-23 MED ORDER — KETOROLAC TROMETHAMINE 15 MG/ML IJ SOLN
15.0000 mg | Freq: Three times a day (TID) | INTRAMUSCULAR | Status: DC
  Administered 2018-05-23 – 2018-05-24 (×2): 15 mg via INTRAVENOUS
  Filled 2018-05-23 (×2): qty 1

## 2018-05-23 MED ORDER — VITAMIN B-12 100 MCG PO TABS
100.0000 ug | ORAL_TABLET | Freq: Every day | ORAL | Status: DC
  Administered 2018-05-24: 100 ug via ORAL
  Filled 2018-05-23 (×2): qty 1

## 2018-05-23 MED ORDER — ACETAMINOPHEN 500 MG PO TABS
1000.0000 mg | ORAL_TABLET | Freq: Four times a day (QID) | ORAL | Status: DC
  Administered 2018-05-23 – 2018-05-24 (×4): 1000 mg via ORAL
  Filled 2018-05-23 (×5): qty 2

## 2018-05-23 MED ORDER — DOCUSATE SODIUM 100 MG PO CAPS
100.0000 mg | ORAL_CAPSULE | Freq: Two times a day (BID) | ORAL | Status: DC
  Administered 2018-05-23 – 2018-05-24 (×3): 100 mg via ORAL
  Filled 2018-05-23 (×3): qty 1

## 2018-05-23 MED ORDER — FENTANYL CITRATE (PF) 100 MCG/2ML IJ SOLN
INTRAMUSCULAR | Status: DC | PRN
  Administered 2018-05-23 (×3): 50 ug via INTRAVENOUS

## 2018-05-23 MED ORDER — ONDANSETRON 4 MG PO TBDP: 4.0000 mg | ORAL_TABLET | Freq: Four times a day (QID) | ORAL | Status: DC | PRN

## 2018-05-23 MED ORDER — DEXAMETHASONE SODIUM PHOSPHATE 10 MG/ML IJ SOLN
INTRAMUSCULAR | Status: DC | PRN
  Administered 2018-05-23: 10 mg via INTRAVENOUS

## 2018-05-23 MED ORDER — KETOROLAC TROMETHAMINE 30 MG/ML IJ SOLN
30.0000 mg | Freq: Once | INTRAMUSCULAR | Status: DC | PRN
Start: 2018-05-23 — End: 2018-05-23

## 2018-05-23 MED ORDER — MIDAZOLAM HCL 2 MG/2ML IJ SOLN
INTRAMUSCULAR | Status: AC
  Filled 2018-05-23: qty 2

## 2018-05-23 MED ORDER — MIDAZOLAM HCL 2 MG/2ML IJ SOLN
INTRAMUSCULAR | Status: AC
  Administered 2018-05-23: 1 mg via INTRAVENOUS
  Filled 2018-05-23: qty 2

## 2018-05-23 MED ORDER — IPRATROPIUM-ALBUTEROL 0.5-2.5 (3) MG/3ML IN SOLN: 3.0000 mL | Freq: Four times a day (QID) | RESPIRATORY_TRACT | Status: DC | PRN

## 2018-05-23 MED ORDER — PHENYLEPHRINE 40 MCG/ML (10ML) SYRINGE FOR IV PUSH (FOR BLOOD PRESSURE SUPPORT)
PREFILLED_SYRINGE | INTRAVENOUS | Status: AC
  Filled 2018-05-23: qty 10

## 2018-05-23 MED ORDER — MORPHINE SULFATE (PF) 2 MG/ML IV SOLN: 1.0000 mg | INTRAVENOUS | Status: DC | PRN

## 2018-05-23 MED ORDER — PROPOFOL 10 MG/ML IV BOLUS
INTRAVENOUS | Status: AC
  Filled 2018-05-23: qty 20

## 2018-05-23 MED ORDER — PROPOFOL 10 MG/ML IV BOLUS
INTRAVENOUS | Status: DC | PRN
  Administered 2018-05-23: 150 mg via INTRAVENOUS

## 2018-05-23 MED ORDER — ZOLPIDEM TARTRATE 5 MG PO TABS: 5.0000 mg | ORAL_TABLET | Freq: Every evening | ORAL | Status: DC | PRN

## 2018-05-23 MED ORDER — POTASSIUM CHLORIDE IN NACL 20-0.9 MEQ/L-% IV SOLN
INTRAVENOUS | Status: DC
  Administered 2018-05-23: 16:00:00 via INTRAVENOUS
  Filled 2018-05-23: qty 1000

## 2018-05-23 MED ORDER — ONDANSETRON HCL 4 MG/2ML IJ SOLN: 4.0000 mg | Freq: Four times a day (QID) | INTRAMUSCULAR | Status: DC | PRN

## 2018-05-23 MED ORDER — ACETAMINOPHEN 500 MG PO TABS
1000.0000 mg | ORAL_TABLET | ORAL | Status: AC
  Administered 2018-05-23: 1000 mg via ORAL
  Filled 2018-05-23: qty 2

## 2018-05-23 MED ORDER — ENOXAPARIN SODIUM 40 MG/0.4ML ~~LOC~~ SOLN
40.0000 mg | SUBCUTANEOUS | Status: DC
  Filled 2018-05-23: qty 0.4

## 2018-05-23 MED ORDER — PROMETHAZINE HCL 25 MG/ML IJ SOLN: 6.2500 mg | INTRAMUSCULAR | Status: DC | PRN

## 2018-05-23 MED ORDER — DIPHENHYDRAMINE HCL 50 MG/ML IJ SOLN: 12.5000 mg | Freq: Four times a day (QID) | INTRAMUSCULAR | Status: DC | PRN

## 2018-05-23 MED ORDER — OXYCODONE HCL 5 MG PO TABS: 5.0000 mg | ORAL_TABLET | Freq: Once | ORAL | Status: DC | PRN

## 2018-05-23 MED ORDER — OXYCODONE HCL 5 MG/5ML PO SOLN: 5.0000 mg | Freq: Once | ORAL | Status: DC | PRN

## 2018-05-23 MED ORDER — GABAPENTIN 300 MG PO CAPS
300.0000 mg | ORAL_CAPSULE | ORAL | Status: AC
  Administered 2018-05-23: 300 mg via ORAL
  Filled 2018-05-23: qty 1

## 2018-05-23 MED ORDER — EPHEDRINE SULFATE 50 MG/ML IJ SOLN
INTRAMUSCULAR | Status: DC | PRN
  Administered 2018-05-23 (×2): 5 mg via INTRAVENOUS

## 2018-05-23 MED ORDER — ONDANSETRON HCL 4 MG/2ML IJ SOLN
INTRAMUSCULAR | Status: AC
  Filled 2018-05-23: qty 2

## 2018-05-23 MED ORDER — ONDANSETRON HCL 4 MG/2ML IJ SOLN
INTRAMUSCULAR | Status: DC | PRN
  Administered 2018-05-23: 4 mg via INTRAVENOUS

## 2018-05-23 MED ORDER — 0.9 % SODIUM CHLORIDE (POUR BTL) OPTIME
TOPICAL | Status: DC | PRN
  Administered 2018-05-23: 1000 mL

## 2018-05-23 MED ORDER — SUGAMMADEX SODIUM 200 MG/2ML IV SOLN
INTRAVENOUS | Status: DC | PRN
  Administered 2018-05-23: 200 mg via INTRAVENOUS

## 2018-05-23 MED ORDER — DIPHENHYDRAMINE HCL 12.5 MG/5ML PO ELIX: 12.5000 mg | ORAL_SOLUTION | Freq: Four times a day (QID) | ORAL | Status: DC | PRN

## 2018-05-23 MED ORDER — KETOROLAC TROMETHAMINE 30 MG/ML IJ SOLN
INTRAMUSCULAR | Status: DC | PRN
  Administered 2018-05-23: 30 mg via INTRAVENOUS

## 2018-05-23 MED ORDER — FENTANYL CITRATE (PF) 250 MCG/5ML IJ SOLN
INTRAMUSCULAR | Status: AC
  Filled 2018-05-23: qty 5

## 2018-05-23 MED ORDER — CEFAZOLIN SODIUM-DEXTROSE 2-4 GM/100ML-% IV SOLN
2.0000 g | INTRAVENOUS | Status: AC
  Administered 2018-05-23: 2 g via INTRAVENOUS
  Filled 2018-05-23: qty 100

## 2018-05-23 MED ORDER — ROCURONIUM BROMIDE 50 MG/5ML IV SOSY
PREFILLED_SYRINGE | INTRAVENOUS | Status: DC | PRN
  Administered 2018-05-23: 50 mg via INTRAVENOUS
  Administered 2018-05-23: 10 mg via INTRAVENOUS

## 2018-05-23 MED ORDER — DEXAMETHASONE SODIUM PHOSPHATE 10 MG/ML IJ SOLN
INTRAMUSCULAR | Status: AC
  Filled 2018-05-23: qty 1

## 2018-05-23 MED ORDER — GABAPENTIN 100 MG PO CAPS
100.0000 mg | ORAL_CAPSULE | Freq: Two times a day (BID) | ORAL | Status: DC
  Administered 2018-05-23 – 2018-05-24 (×2): 100 mg via ORAL
  Filled 2018-05-23 (×2): qty 1

## 2018-05-23 MED ORDER — BUPIVACAINE-EPINEPHRINE (PF) 0.25% -1:200000 IJ SOLN
INTRAMUSCULAR | Status: AC
  Filled 2018-05-23: qty 30

## 2018-05-23 MED ORDER — TRAMADOL HCL 50 MG PO TABS: 50.0000 mg | ORAL_TABLET | Freq: Four times a day (QID) | ORAL | Status: DC | PRN

## 2018-05-23 MED ORDER — FENTANYL CITRATE (PF) 100 MCG/2ML IJ SOLN: 25.0000 ug | INTRAMUSCULAR | Status: DC | PRN

## 2018-05-23 SURGICAL SUPPLY — 56 items
BENZOIN TINCTURE PRP APPL 2/3 (GAUZE/BANDAGES/DRESSINGS) ×2 IMPLANT
BLADE CLIPPER SURG (BLADE) ×4 IMPLANT
BLADE SURG 10 STRL SS (BLADE) ×2 IMPLANT
BLADE SURG 15 STRL LF DISP TIS (BLADE) ×1 IMPLANT
BLADE SURG 15 STRL SS (BLADE) ×1
CANISTER SUCT 3000ML PPV (MISCELLANEOUS) IMPLANT
CHLORAPREP W/TINT 26ML (MISCELLANEOUS) ×4 IMPLANT
CLSR STERI-STRIP ANTIMIC 1/2X4 (GAUZE/BANDAGES/DRESSINGS) ×2 IMPLANT
COVER SURGICAL LIGHT HANDLE (MISCELLANEOUS) ×2 IMPLANT
COVER WAND RF STERILE (DRAPES) IMPLANT
DERMABOND ADVANCED (GAUZE/BANDAGES/DRESSINGS) ×1
DERMABOND ADVANCED .7 DNX12 (GAUZE/BANDAGES/DRESSINGS) ×1 IMPLANT
DRAIN PENROSE 1/2X12 LTX STRL (WOUND CARE) ×2 IMPLANT
DRAPE LAPAROTOMY 100X72 PEDS (DRAPES) ×2 IMPLANT
DRAPE LAPAROTOMY TRNSV 102X78 (DRAPE) IMPLANT
DRAPE UTILITY XL STRL (DRAPES) ×4 IMPLANT
DRSG TEGADERM 4X4.75 (GAUZE/BANDAGES/DRESSINGS) ×2 IMPLANT
ELECT CAUTERY BLADE 6.4 (BLADE) ×2 IMPLANT
ELECT REM PT RETURN 9FT ADLT (ELECTROSURGICAL) ×2
ELECTRODE REM PT RTRN 9FT ADLT (ELECTROSURGICAL) ×1 IMPLANT
GAUZE SPONGE 4X4 12PLY STRL (GAUZE/BANDAGES/DRESSINGS) ×2 IMPLANT
GLOVE BIOGEL M STRL SZ7.5 (GLOVE) ×2 IMPLANT
GLOVE BIOGEL PI IND STRL 6.5 (GLOVE) ×1 IMPLANT
GLOVE BIOGEL PI IND STRL 8 (GLOVE) ×2 IMPLANT
GLOVE BIOGEL PI INDICATOR 6.5 (GLOVE) ×1
GLOVE BIOGEL PI INDICATOR 8 (GLOVE) ×2
GLOVE SURG SS PI 6.5 STRL IVOR (GLOVE) ×2 IMPLANT
GOWN STRL REUS W/ TWL LRG LVL3 (GOWN DISPOSABLE) ×1 IMPLANT
GOWN STRL REUS W/TWL 2XL LVL3 (GOWN DISPOSABLE) ×2 IMPLANT
GOWN STRL REUS W/TWL LRG LVL3 (GOWN DISPOSABLE) ×1
KIT BASIN OR (CUSTOM PROCEDURE TRAY) ×2 IMPLANT
KIT TURNOVER KIT B (KITS) ×2 IMPLANT
MARKER SKIN DUAL TIP RULER LAB (MISCELLANEOUS) ×2 IMPLANT
MESH ULTRAPRO 3X6 7.6X15CM (Mesh General) ×2 IMPLANT
NEEDLE HYPO 25GX1X1/2 BEV (NEEDLE) ×2 IMPLANT
NS IRRIG 1000ML POUR BTL (IV SOLUTION) ×2 IMPLANT
PACK SURGICAL SETUP 50X90 (CUSTOM PROCEDURE TRAY) ×2 IMPLANT
PAD ARMBOARD 7.5X6 YLW CONV (MISCELLANEOUS) ×4 IMPLANT
PENCIL BUTTON HOLSTER BLD 10FT (ELECTRODE) ×2 IMPLANT
SPECIMEN JAR SMALL (MISCELLANEOUS) IMPLANT
SPONGE INTESTINAL PEANUT (DISPOSABLE) IMPLANT
SPONGE LAP 18X18 X RAY DECT (DISPOSABLE) ×2 IMPLANT
STRIP CLOSURE SKIN 1/2X4 (GAUZE/BANDAGES/DRESSINGS) ×2 IMPLANT
SUT MNCRL AB 4-0 PS2 18 (SUTURE) ×2 IMPLANT
SUT PROLENE 2 0 CT2 30 (SUTURE) ×4 IMPLANT
SUT VIC AB 2-0 CT1 36 (SUTURE) ×2 IMPLANT
SUT VIC AB 2-0 SH 27 (SUTURE) ×1
SUT VIC AB 2-0 SH 27XBRD (SUTURE) ×1 IMPLANT
SUT VIC AB 3-0 SH 18 (SUTURE) ×2 IMPLANT
SUT VICRYL AB 3 0 TIES (SUTURE) ×2 IMPLANT
SYR BULB 3OZ (MISCELLANEOUS) ×2 IMPLANT
SYR CONTROL 10ML LL (SYRINGE) ×2 IMPLANT
TOWEL OR 17X24 6PK STRL BLUE (TOWEL DISPOSABLE) IMPLANT
TOWEL OR 17X26 10 PK STRL BLUE (TOWEL DISPOSABLE) ×2 IMPLANT
TUBE CONNECTING 12X1/4 (SUCTIONS) IMPLANT
YANKAUER SUCT BULB TIP NO VENT (SUCTIONS) IMPLANT

## 2018-05-23 NOTE — Progress Notes (Signed)
Pt new admit from PACU alert and oriented s/p right inguinal hernia , wound site dry and intact with skin glued, room air, SCD, ice pack and no complain of pain at this time.

## 2018-05-23 NOTE — Transfer of Care (Signed)
Immediate Anesthesia Transfer of Care Note  Patient: Dan Salazar  Procedure(s) Performed: OPEN RIGHT INGUINAL HERNIA REPAIR ERAS PATHWAY (Right ) INSERTION OF MESH (Right )  Patient Location: PACU  Anesthesia Type:General  Level of Consciousness: awake, alert  and drowsy  Airway & Oxygen Therapy: Patient Spontanous Breathing and Patient connected to nasal cannula oxygen  Post-op Assessment: Report given to RN and Post -op Vital signs reviewed and stable  Post vital signs: Reviewed and stable  Last Vitals:  Vitals Value Taken Time  BP 160/94 05/23/2018 12:44 PM  Temp    Pulse 62 05/23/2018 12:46 PM  Resp 15 05/23/2018 12:46 PM  SpO2 100 % 05/23/2018 12:46 PM  Vitals shown include unvalidated device data.  Last Pain:  Vitals:   05/23/18 0928  TempSrc: Oral      Patients Stated Pain Goal: 3 (28/40/69 8614)  Complications: No apparent anesthesia complications

## 2018-05-23 NOTE — Anesthesia Procedure Notes (Signed)
Anesthesia Regional Block: TAP block   Pre-Anesthetic Checklist: ,, timeout performed, Correct Patient, Correct Site, Correct Laterality, Correct Procedure, Correct Position, site marked, Risks and benefits discussed,  Surgical consent,  Pre-op evaluation,  At surgeon's request and post-op pain management  Laterality: Right  Prep: chloraprep       Needles:  Injection technique: Single-shot  Needle Type: Echogenic Needle     Needle Length: 9cm      Additional Needles:   Procedures:,,,, ultrasound used (permanent image in chart),,,,  Narrative:  Start time: 05/23/2018 10:25 AM End time: 05/23/2018 10:34 AM Injection made incrementally with aspirations every 5 mL.  Performed by: Personally  Anesthesiologist: Myrtie Soman, MD  Additional Notes: Patient tolerated the procedure well without complications

## 2018-05-23 NOTE — Discharge Instructions (Signed)
[facility]  POST OP INSTRUCTIONS Always review your discharge instruction sheet given to you by the facility where your surgery was performed. IF YOU HAVE DISABILITY OR FAMILY LEAVE FORMS, YOU MUST BRING THEM TO THE OFFICE FOR PROCESSING.   DO NOT GIVE THEM TO YOUR DOCTOR.  PAIN CONTROL - SEE "MANAGING YOUR PAIN AFTER SURGERY WITHOUT OPIOIDS" HANDOUT BELOW  1. First take acetaminophen (Tylenol) AND/or ibuprofen (Advil) to control your pain after surgery.  Follow directions on package.  Taking acetaminophen (Tylenol) and/or ibuprofen (Advil) regularly after surgery will help to control your pain and lower the amount of prescription pain medication you may need.  You should not take more than 3,000 mg (3 grams) of acetaminophen (Tylenol) in 24 hours.  You should not take ibuprofen (Advil), aleve, motrin, naprosyn or other NSAIDS if you have a history of stomach ulcers or chronic kidney disease.  2. A prescription for pain medication may be given to you upon discharge.  Take your pain medication as prescribed, if you still have uncontrolled pain after taking acetaminophen (Tylenol) or ibuprofen (Advil). 3. Use ice packs to help control pain. 4. If you need a refill on your pain medication, please contact your pharmacy.  They will contact our office to request authorization. Prescriptions will not be filled after 5pm or on week-ends.  HOME MEDICATIONS 5. Take your usually prescribed medications unless otherwise directed.  DIET 6. You should follow a light diet the first few days after arrival home.  Be sure to include lots of fluids daily. Avoid fatty, fried foods.   CONSTIPATION 7. It is common to experience some constipation after surgery and if you are taking pain medication.  Increasing fluid intake and taking a stool softener (such as Colace) will usually help or prevent this problem from occurring.  A mild laxative (Milk of Magnesia or Miralax) should be taken according  to package instructions if there are no bowel movements after 48 hours.  WOUND/INCISION CARE 8. Most patients will experience some swelling and bruising in the area of the incisions.  Ice packs will help.  Swelling and bruising can take several days to resolve.  9. Unless discharge instructions indicate otherwise, follow guidelines below  a. STERI-STRIPS - you may remove your outer bandages 48 hours after surgery, and you may shower at that time.  You have steri-strips (small skin tapes) in place directly over the incision.  These strips should be left on the skin for 7-10 days.   b. DERMABOND/SKIN GLUE - you may shower in 24 hours.  The glue will flake off over the next 2-3 weeks. 10. Any sutures or staples will be removed at the office during your follow-up visit.  ACTIVITIES 11. You may resume regular (light) daily activities beginning the next day--such as daily self-care, walking, climbing stairs--gradually increasing activities as tolerated.  You may have sexual intercourse when it is comfortable.  Refrain from any heavy lifting or straining until approved by your doctor. a. You may drive when you are no longer taking prescription pain medication, you can comfortably wear a seatbelt, and you can safely maneuver your car and apply brakes.  FOLLOW-UP 12. You should see your doctor in the office for a follow-up appointment approximately 2-3 weeks after your surgery.  You should have been given your post-op/follow-up appointment when your surgery was scheduled.  If you did not receive a post-op/follow-up appointment, make sure that you call for this appointment within a day or two after you  arrive home to insure a convenient appointment time.  OTHER INSTRUCTIONS 13. DO NOT LIFT, PUSH, PULL ANYTHING GREATER THAN 15LBS  WHEN TO CALL YOUR DOCTOR: 1. Fever over 101.0 2. Inability to urinate 3. Continued bleeding from incision. 4. Increased pain, redness, or drainage from the  incision. 5. Increasing abdominal pain  The clinic staff is available to answer your questions during regular business hours.  Please dont hesitate to call and ask to speak to one of the nurses for clinical concerns.  If you have a medical emergency, go to the nearest emergency room or call 911.  A surgeon from [facility] is always on call at the hospital. [address], [place], [place], [place]  [address] ? [address], [place], [place]   [address] [phone] ? [phone] ? FAX [phone] Web site: [email or web address]     Managing Your Pain After Surgery Without Opioids    Thank you for participating in our program to help patients manage their pain after surgery without opioids. This is part of our effort to provide you with the best care possible, without exposing you or your family to the risk that opioids pose.  What pain can I expect after surgery? You can expect to have some pain after surgery. This is normal. The pain is typically worse the day after surgery, and quickly begins to get better. Many studies have found that many patients are able to manage their pain after surgery with Over-the-Counter (OTC) medications such as Tylenol and Motrin. If you have a condition that does not allow you to take Tylenol or Motrin, notify your surgical team.  How will I manage my pain? The best strategy for controlling your pain after surgery is around the clock pain control with Tylenol (acetaminophen) and Motrin (ibuprofen or Advil). Alternating these medications with each other allows you to maximize your pain control. In addition to Tylenol and Motrin, you can use heating pads or ice packs on your incisions to help reduce your pain.  How will I alternate your regular strength over-the-counter pain medication? You will take a dose of pain medication every three hours. ; Start by taking 650 mg of Tylenol (2 pills of 325 mg) ; 3 hours later  take 600 mg of Motrin (3 pills of 200 mg) ; 3 hours after taking the Motrin take 650 mg of Tylenol ; 3 hours after that take 600 mg of Motrin.   - 1 -  See example - if your first dose of Tylenol is at 12:00 PM   12:00 PM Tylenol 650 mg (2 pills of 325 mg)  3:00 PM Motrin 600 mg (3 pills of 200 mg)  6:00 PM Tylenol 650 mg (2 pills of 325 mg)  9:00 PM Motrin 600 mg (3 pills of 200 mg)  Continue alternating every 3 hours   We recommend that you follow this schedule around-the-clock for at least 3 days after surgery, or until you feel that it is no longer needed. Use the table on the last page of this handout to keep track of the medications you are taking. Important: Do not take more than 3000mg  of Tylenol or 3200mg  of Motrin in a 24-hour period. Do not take ibuprofen/Motrin if you have a history of bleeding stomach ulcers, severe kidney disease, &/or actively taking a blood thinner  What if I still have pain? If you have pain that is not controlled with the over-the-counter pain medications (Tylenol and Motrin or Advil) you might  have what we call breakthrough pain. You will receive a prescription for a small amount of an opioid pain medication such as Oxycodone, Tramadol, or Tylenol with Codeine. Use these opioid pills in the first 24 hours after surgery if you have breakthrough pain. Do not take more than 1 pill every 4-6 hours.  If you still have uncontrolled pain after using all opioid pills, don't hesitate to call our staff using the number provided. We will help make sure you are managing your pain in the best way possible, and if necessary, we can provide a prescription for additional pain medication.   Day 1    Time  Name of Medication Number of pills taken  Amount of Acetaminophen  Pain Level   Comments  AM PM       AM PM       AM PM       AM PM       AM PM       AM PM       AM PM       AM PM       Total Daily amount of Acetaminophen Do not take more than  3,000  mg per day      Day 2    Time  Name of Medication Number of pills taken  Amount of Acetaminophen  Pain Level   Comments  AM PM       AM PM       AM PM       AM PM       AM PM       AM PM       AM PM       AM PM       Total Daily amount of Acetaminophen Do not take more than  3,000 mg per day      Day 3    Time  Name of Medication Number of pills taken  Amount of Acetaminophen  Pain Level   Comments  AM PM       AM PM       AM PM       AM PM          AM PM       AM PM       AM PM       AM PM       Total Daily amount of Acetaminophen Do not take more than  3,000 mg per day      Day 4    Time  Name of Medication Number of pills taken  Amount of Acetaminophen  Pain Level   Comments  AM PM       AM PM       AM PM       AM PM       AM PM       AM PM       AM PM       AM PM       Total Daily amount of Acetaminophen Do not take more than  3,000 mg per day      Day 5    Time  Name of Medication Number of pills taken  Amount of Acetaminophen  Pain Level   Comments  AM PM       AM PM       AM PM       AM PM  AM PM       AM PM       AM PM       AM PM       Total Daily amount of Acetaminophen Do not take more than  3,000 mg per day       Day 6    Time  Name of Medication Number of pills taken  Amount of Acetaminophen  Pain Level  Comments  AM PM       AM PM       AM PM       AM PM       AM PM       AM PM       AM PM       AM PM       Total Daily amount of Acetaminophen Do not take more than  3,000 mg per day      Day 7    Time  Name of Medication Number of pills taken  Amount of Acetaminophen  Pain Level   Comments  AM PM       AM PM       AM PM       AM PM       AM PM       AM PM       AM PM       AM PM       Total Daily amount of Acetaminophen Do not take more than  3,000 mg per day        For additional information about how and where to safely dispose of unused opioid medications -  [employer].com.br  Disclaimer: This document contains information and/or instructional materials adapted from [place] for the typical patient with your condition. It does not replace medical advice from your health care provider because your experience may differ from that of the typical patient. Talk to your health care provider if you have any questions about this document, your condition or your treatment plan. Adapted from [place]

## 2018-05-23 NOTE — Anesthesia Procedure Notes (Signed)
Procedure Name: Intubation Date/Time: 05/23/2018 11:04 AM Performed by: Inda Coke, CRNA Pre-anesthesia Checklist: Patient identified, Emergency Drugs available, Suction available and Patient being monitored Patient Re-evaluated:Patient Re-evaluated prior to induction Oxygen Delivery Method: Circle System Utilized Preoxygenation: Pre-oxygenation with 100% oxygen Induction Type: IV induction Ventilation: Mask ventilation without difficulty and Oral airway inserted - appropriate to patient size Laryngoscope Size: Mac and 4 Grade View: Grade I Tube type: Oral Tube size: 7.5 mm Number of attempts: 1 Airway Equipment and Method: Stylet and Oral airway Placement Confirmation: ETT inserted through vocal cords under direct vision,  positive ETCO2 and breath sounds checked- equal and bilateral Secured at: 22 cm Tube secured with: Tape Dental Injury: Teeth and Oropharynx as per pre-operative assessment

## 2018-05-23 NOTE — Interval H&P Note (Signed)
History and Physical Interval Note:  05/23/2018 10:41 AM  Dan Salazar  has presented today for surgery, with the diagnosis of Right inguinal hernia  The various methods of treatment have been discussed with the patient and family. After consideration of risks, benefits and other options for treatment, the patient has consented to  Procedure(s): OPEN RIGHT INGUINAL [place] (Right) INSERTION OF MESH (Right) as a surgical intervention .  The patient's history has been reviewed, patient examined, no change in status, stable for surgery.  I have reviewed the patient's chart and labs.  Questions were answered to the patient's satisfaction.    Leighton Ruff. [name], MD, FACS General, Bariatric, & Minimally Invasive Surgery [facility]  Greer Pickerel

## 2018-05-23 NOTE — Anesthesia Preprocedure Evaluation (Signed)
Anesthesia Evaluation  Patient identified by MRN, date of birth, ID band Patient awake    Reviewed: Allergy & Precautions, NPO status , Patient's Chart, lab work & pertinent test results  Airway Mallampati: II  TM Distance: >3 FB Neck ROM: Full    Dental no notable dental hx.    Pulmonary neg pulmonary ROS,    Pulmonary exam normal breath sounds clear to auscultation       Cardiovascular hypertension, Normal cardiovascular exam Rhythm:Regular Rate:Normal     Neuro/Psych negative neurological ROS  negative psych ROS   GI/Hepatic negative GI ROS, Neg liver ROS,   Endo/Other  negative endocrine ROS  Renal/GU negative Renal ROS  negative genitourinary   Musculoskeletal negative musculoskeletal ROS (+)   Abdominal   Peds negative pediatric ROS (+)  Hematology negative hematology ROS (+)   Anesthesia Other Findings   Reproductive/Obstetrics negative OB ROS                             Anesthesia Physical Anesthesia Plan  ASA: II  Anesthesia Plan: General   Post-op Pain Management:  Regional for Post-op pain   Induction: Intravenous  PONV Risk Score and Plan: Ondansetron, Dexamethasone and Treatment may vary due to age or medical condition  Airway Management Planned: LMA and Oral ETT  Additional Equipment:   Intra-op Plan:   Post-operative Plan: Extubation in OR  Informed Consent: I have reviewed the patients History and Physical, chart, labs and discussed the procedure including the risks, benefits and alternatives for the proposed anesthesia with the patient or authorized representative who has indicated his/her understanding and acceptance.   Dental advisory given  Plan Discussed with: CRNA and Surgeon  Anesthesia Plan Comments:         Anesthesia Quick Evaluation

## 2018-05-23 NOTE — Anesthesia Procedure Notes (Signed)
Anesthesia Procedure Image    

## 2018-05-23 NOTE — Op Note (Signed)
Dan Salazar [id number] Apr 25, 1961 05/23/2018   Open Right Indirect Inguinal Hernia Repair with Mesh Procedure Note  Indications: The patient presented with a history of a right, reducible hernia.    Pre-operative Diagnosis: right reducible inguinal hernia  Post-operative Diagnosis: right indirect inguinal hernia + tear in conjoined tendon  Surgeon: Greer Pickerel MD FACS  Assistants: none  Anesthesia: General endotracheal anesthesia and TAPP block by anesthesia  Procedure Details  The patient was seen again in the Holding Room. The risks, benefits, complications, treatment options, and expected outcomes were discussed with the patient. The possibilities of reaction to medication, pulmonary aspiration, perforation of viscus, bleeding, recurrent infection, the need for additional procedures, and development of a complication requiring transfusion or further operation were discussed with the patient and/or family. The likelihood of success in repairing the hernia and returning the patient to their previous functional status is good.  There was concurrence with the proposed plan, and informed consent was obtained. The site of surgery was properly noted/marked. The patient was taken to the Operating Room, identified as [facility], and the procedure verified as left inguinal hernia repair. A Time Out was held and the above information confirmed. He received ERAS meds and TAP block pre-operatively.   The patient was placed in the supine position and underwent induction of anesthesia. The lower abdomen and groin was prepped with Chloraprep and draped in the standard fashion, and 0.25% Marcaine with epinephrine was used to anesthetize the skin over the mid-portion of the inguinal canal. An oblique incision was made. Dissection was carried down through the subcutaneous tissue with cautery to the external oblique fascia.  We opened the external oblique fascia along the direction of its fibers to the  external ring. I visualized a tear in the conjoined tendon.  The spermatic cord was circumferentially dissected bluntly and retracted with a Penrose drain.  The floor of the inguinal canal was inspected & there was no direct defect.  We skeletonized the spermatic cord and isolated a hernia sac which was reduced into the abdominal cavity. The ilioginguinal nerve was identified and protected.  We used a 3 x 6 inch piece of Ultrapro mesh, which was cut into a keyhole shape.  This was secured with 2-0 Prolene, beginning at the pubic tubercle, running this along the shelving edge inferiorly. Superiorly, the mesh was secured to the internal oblique fascia with interrupted 2-0 Prolene sutures.  The tails of the mesh were sutured together behind the spermatic cord.  The mesh was tucked underneath the external oblique fascia laterally.  The external oblique fascia was reapproximated with 2-0 Vicryl.  3-0 Vicryl was used to close the subcutaneous tissues and 4-0 Monocryl was used to close the skin in subcuticular fashion.  Dermabond was used to seal the incision.  A clean dressing was applied.  The patient was then extubated and brought to the recovery room in stable condition.  All sponge, instrument, and needle counts were correct prior to closure and at the conclusion of the case.  He is being kept overnight due to his social situation.    Estimated Blood Loss: Minimal                 Complications: None; patient tolerated the procedure well.         Disposition: PACU - hemodynamically stable.         Condition: stable  Leighton Ruff. [name], MD, FACS General, Bariatric, & Minimally Invasive Surgery [facility] [place]

## 2018-05-24 DIAGNOSIS — K4090 Unilateral inguinal hernia, without obstruction or gangrene, not specified as recurrent: Secondary | ICD-10-CM | POA: Diagnosis not present

## 2018-05-24 MED ORDER — TRAMADOL HCL 50 MG PO TABS: 50.0000 mg | ORAL_TABLET | Freq: Four times a day (QID) | ORAL | 0 refills | Status: DC | PRN

## 2018-05-24 NOTE — Progress Notes (Signed)
Dan Salazar to be D/C'd  per MD order. Discussed with the patient and all questions fully answered.  VSS, Skin clean, dry and intact without evidence of skin break down, no evidence of skin tears noted.  IV catheter discontinued intact. Site without signs and symptoms of complications. Dressing and pressure applied.  An After Visit Summary was printed and given to the patient. Patient received prescription.  D/c education completed with patient/family including follow up instructions, medication list, d/c activities limitations if indicated, with other d/c instructions as indicated by MD - patient able to verbalize understanding, all questions fully answered.   Patient instructed to return to ED, call 911, or call MD for any changes in condition.   Patient to be escorted via [place], and D/C home via private auto.

## 2018-05-24 NOTE — Plan of Care (Signed)
  Problem: Pain Managment: Goal: General experience of comfort will improve Outcome: Progressing   Problem: Safety: Goal: Ability to remain free from injury will improve Outcome: Progressing   Problem: Skin Integrity: Goal: Risk for impaired skin integrity will decrease Outcome: Progressing   

## 2018-05-24 NOTE — Discharge Summary (Signed)
Physician Discharge Summary  Patient ID:  Dan Salazar  MRN: [id number]  DOB/AGE: [birth year] 57 y.o.  Admit date: 05/23/2018 Discharge date: 05/24/2018  Discharge Diagnoses:   Active Problems:   S/P right inguinal hernia repair  Operation: Procedure(s): OPEN RIGHT INGUINAL HERNIA REPAIR, INSERTION OF MESH on 05/23/2018 - D. Lucia Gaskins  Discharged Condition: good  Hospital Course: Dan Salazar is an 57 y.o. male whose primary care physician is Mardi Mainland, FNP and who was admitted 05/23/2018 with a chief complaint of right inguinal hernia.   He was brought to the operating room on 05/23/2018 and underwent an open RIGHT INGUINAL HERNIA REPAIR, INSERTION OF MESH.   He is now one day post op, doing well, and having essentially no pain.  He has a friend, Dan Salazar, who is his POA to come pick him up.  The discharge instructions were reviewed with the patient.  Consults: None  Significant Diagnostic Studies: Results for orders placed or performed during the hospital encounter of 80/16/55  Basic metabolic panel  Result Value Ref Range   Sodium 145 135 - 145 mmol/L   Potassium 3.0 (L) 3.5 - 5.1 mmol/L   Chloride 111 98 - 111 mmol/L   CO2 25 22 - 32 mmol/L   Glucose, Bld 98 70 - 99 mg/dL   BUN 11 6 - 20 mg/dL   Creatinine, Ser 1.21 0.61 - 1.24 mg/dL   Calcium 8.5 (L) 8.9 - 10.3 mg/dL   GFR calc non Af Amer >60 >60 mL/min   GFR calc Af Amer >60 >60 mL/min   Anion gap 9 5 - 15  CBC WITH DIFFERENTIAL  Result Value Ref Range   WBC 6.5 4.0 - 10.5 K/uL   RBC 4.40 4.22 - 5.81 MIL/uL   Hemoglobin 12.7 (L) 13.0 - 17.0 g/dL   HCT 40.7 39.0 - 52.0 %   MCV 92.5 80.0 - 100.0 fL   MCH 28.9 26.0 - 34.0 pg   MCHC 31.2 30.0 - 36.0 g/dL   RDW 12.6 11.5 - 15.5 %   Platelets 185 150 - 400 K/uL   nRBC 0.0 0.0 - 0.2 %   Neutrophils Relative % 73 %   Neutro Abs 4.9 1.7 - 7.7 K/uL   Lymphocytes Relative 20 %   Lymphs Abs 1.3 0.7 - 4.0 K/uL   Monocytes Relative 5 %   Monocytes Absolute  0.3 0.1 - 1.0 K/uL   Eosinophils Relative 1 %   Eosinophils Absolute 0.0 0.0 - 0.5 K/uL   Basophils Relative 1 %   Basophils Absolute 0.0 0.0 - 0.1 K/uL   Immature Granulocytes 0 %   Abs Immature Granulocytes 0.01 0.00 - 0.07 K/uL    No results found.  Discharge Exam:  Vitals:   05/23/18 2151 05/24/18 1000  BP: 135/84 140/80  Pulse: 62 62  Resp: 18 18  Temp: 98.1 F (36.7 C) 97.6 F (36.4 C)  SpO2: 98% 98%    General: WN WM who is alert and generally healthy appearing.  Lungs: Clear to auscultation and symmetric breath sounds. Heart:  RRR. No murmur or rub. Abdomen: Soft. No mass. Normal bowel sounds.  Incision looks good.  Discharge Medications:   Allergies as of 05/24/2018      Reactions   Hctz [hydrochlorothiazide] Other (See Comments)   Severe electrolyte abnormalities      Medication List    TAKE these medications   ipratropium-albuterol 0.5-2.5 (3) MG/3ML Soln Commonly known as:  DUONEB Take 3 mLs by nebulization every 6 (  six) hours as needed.   lisinopril 20 MG tablet Commonly known as:  PRINIVIL,ZESTRIL Take 20 mg by mouth 3 (three) times daily.   thiamine 100 MG tablet Commonly known as:  VITAMIN B-1 Take 1 tablet (100 mg total) by mouth daily.   traMADol 50 MG tablet Commonly known as:  ULTRAM Take 1 tablet (50 mg total) by mouth every 6 (six) hours as needed for moderate pain.   vitamin B-12 100 MCG tablet Commonly known as:  CYANOCOBALAMIN Take 1 tablet (100 mcg total) by mouth daily.       Disposition:     Follow-up Information    Greer Pickerel, MD. Schedule an appointment as soon as possible for a visit in 4 weeks.   Specialty:  General Surgery Why:  For wound re-check Contact information: [place] [address] [place] [address] [phone]            Signed: Alphonsa Overall, M.D., [facility] Office:  [phone]  05/24/2018, 11:56 AM

## 2018-05-24 NOTE — Plan of Care (Signed)

## 2018-05-26 ENCOUNTER — Encounter (HOSPITAL_COMMUNITY): Payer: Self-pay | Admitting: General Surgery

## 2018-05-26 NOTE — Anesthesia Postprocedure Evaluation (Signed)
Anesthesia Post Note  Patient: Dan Salazar, Dan Salazar  Procedure(s) Performed: OPEN RIGHT INGUINAL HERNIA REPAIR ERAS PATHWAY (Right ) INSERTION OF MESH (Right )     Patient location during evaluation: PACU Anesthesia Type: General Level of consciousness: awake and alert Pain management: pain level controlled Vital Signs Assessment: post-procedure vital signs reviewed and stable Respiratory status: spontaneous breathing, nonlabored ventilation, respiratory function stable and patient connected to nasal cannula oxygen Cardiovascular status: blood pressure returned to baseline and stable Postop Assessment: no apparent nausea or vomiting Anesthetic complications: no    Last Vitals:  Vitals:   05/23/18 2151 05/24/18 1000  BP: 135/84 140/80  Pulse: 62 62  Resp: 18 18  Temp: 36.7 C 36.4 C  SpO2: 98% 98%    Last Pain:  Vitals:   05/24/18 1200  TempSrc:   PainSc: 0-No pain                 Vitali Seibert S

## 2018-07-09 ENCOUNTER — Other Ambulatory Visit: Payer: Self-pay | Admitting: Thoracic Surgery (Cardiothoracic Vascular Surgery)

## 2018-07-09 DIAGNOSIS — J90 Pleural effusion, not elsewhere classified: Secondary | ICD-10-CM

## 2018-07-30 DIAGNOSIS — A0472 Enterocolitis due to Clostridium difficile, not specified as recurrent: Secondary | ICD-10-CM

## 2018-07-30 HISTORY — DX: Enterocolitis due to Clostridium difficile, not specified as recurrent: A04.72

## 2018-08-07 ENCOUNTER — Other Ambulatory Visit: Payer: Medicaid Other

## 2018-08-08 ENCOUNTER — Ambulatory Visit
Admission: RE | Admit: 2018-08-08 | Discharge: 2018-08-08 | Disposition: A | Discharge status: Home / Self Care | Payer: Medicaid Other | Source: Ambulatory Visit | Attending: Thoracic Surgery (Cardiothoracic Vascular Surgery) | Admitting: Thoracic Surgery (Cardiothoracic Vascular Surgery)

## 2018-08-08 DIAGNOSIS — J90 Pleural effusion, not elsewhere classified: Secondary | ICD-10-CM

## 2018-08-08 IMAGING — CT CT CHEST W/O CM
2 of 4 series · 14 of 36 positions shown, 17 images · non-contrast
Comparison: [DATE]

CLINICAL DATA: Follow-up pleural effusions

EXAM:
CT CHEST WITHOUT CONTRAST
TECHNIQUE: Multidetector CT imaging of the chest was performed following the
standard protocol without IV contrast.

[Series 2: chest 2.00 br40 s3 ax · axial · 0.57mm/px · z∈[+1287,+1557]mm · 11 of 161 slices shown, 14 images]
[im 13/161  mediastinal]
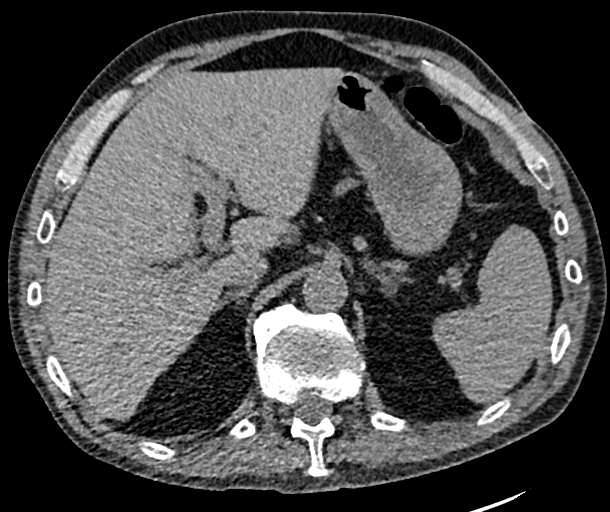
[im 13/161  lung]
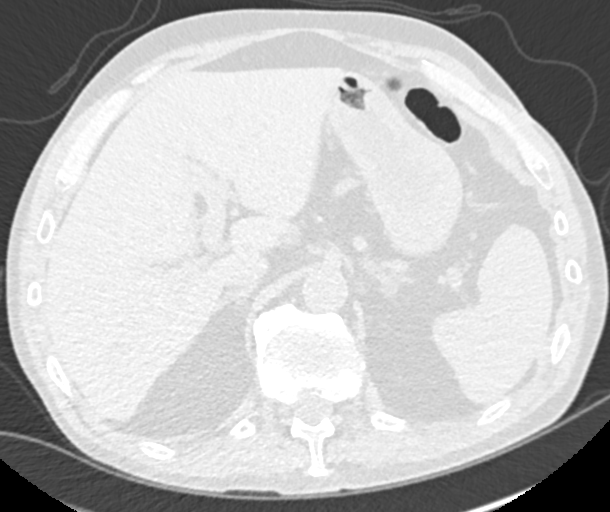
[im 25/161  lung]
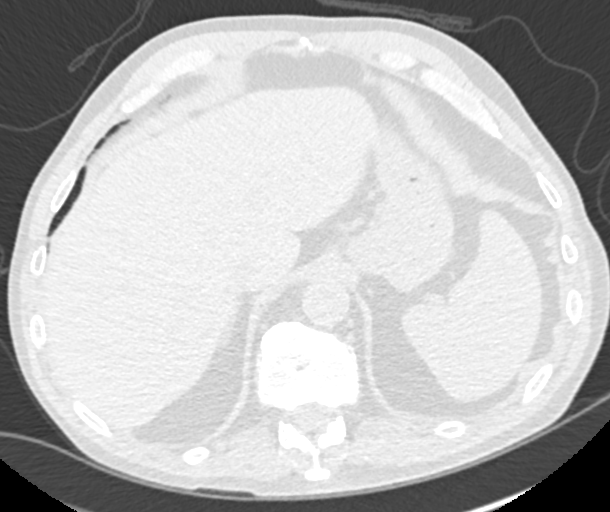
[im 37/161  lung]
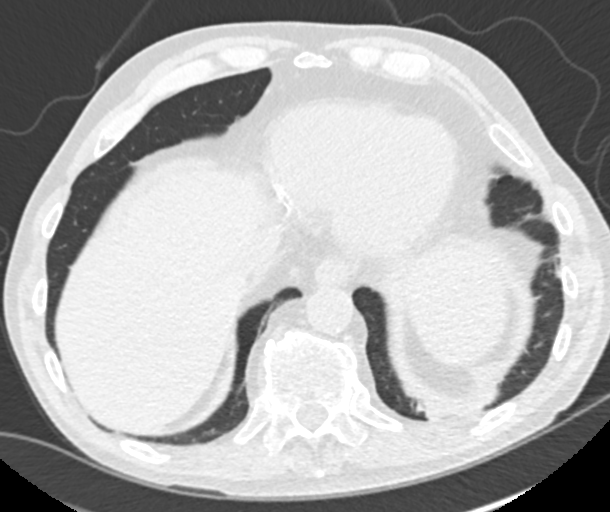
[im 50/161  lung]
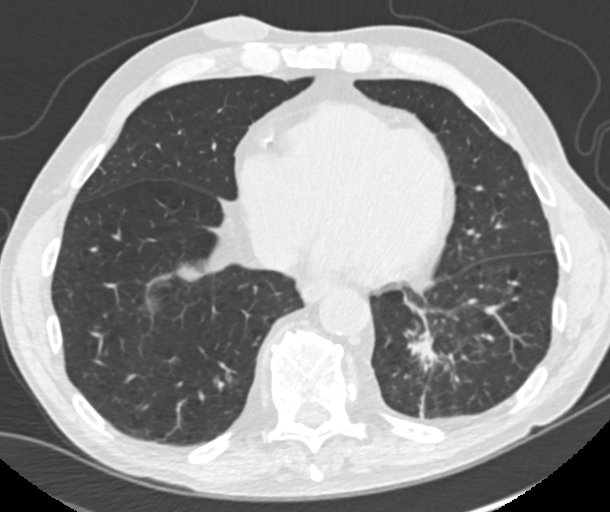
[im 62/161  mediastinal]
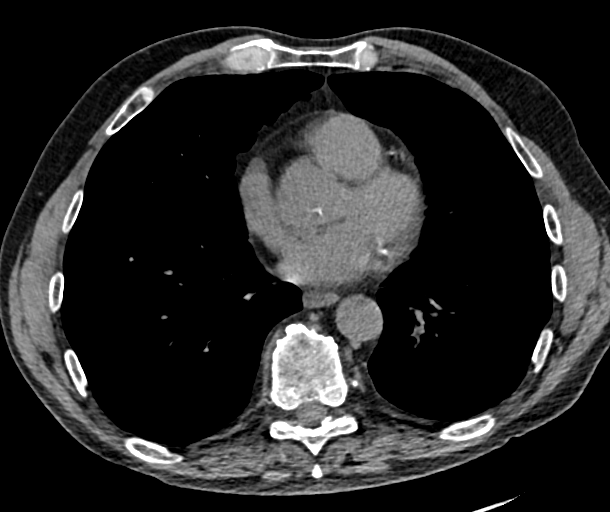
[im 62/161  lung]
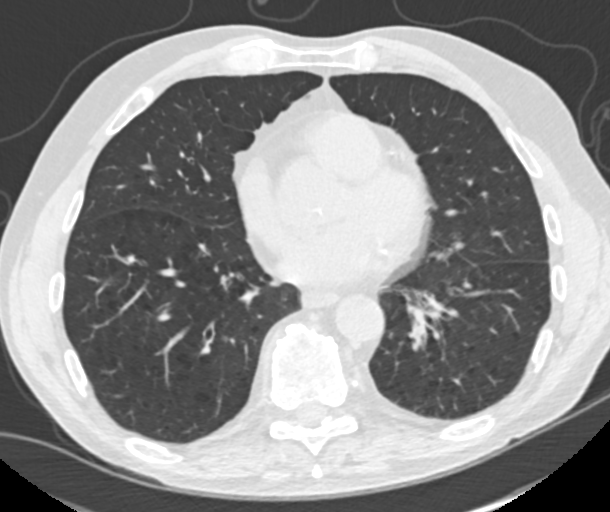
[im 87/161  lung]
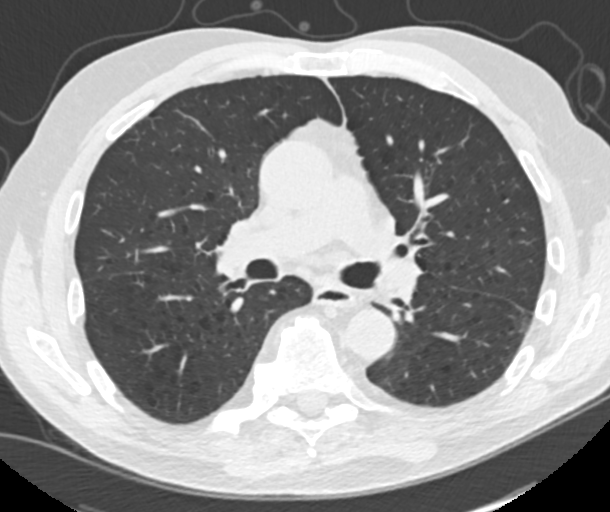
[im 99/161  lung]
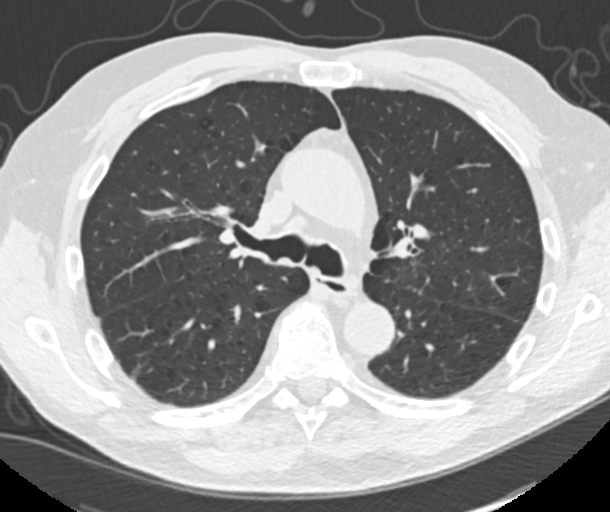
[im 111/161  lung]
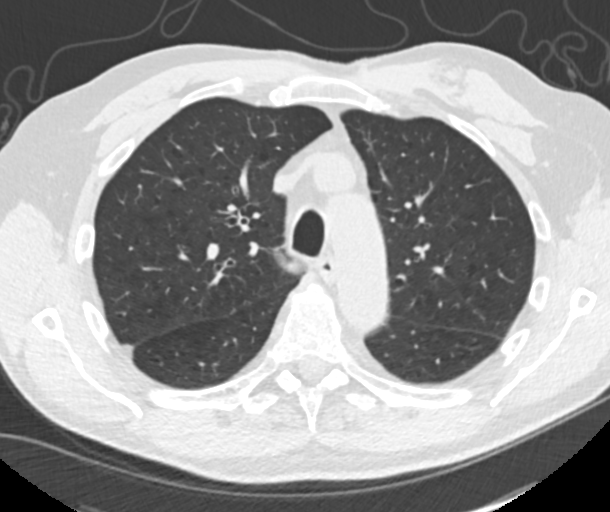
[im 124/161  mediastinal]
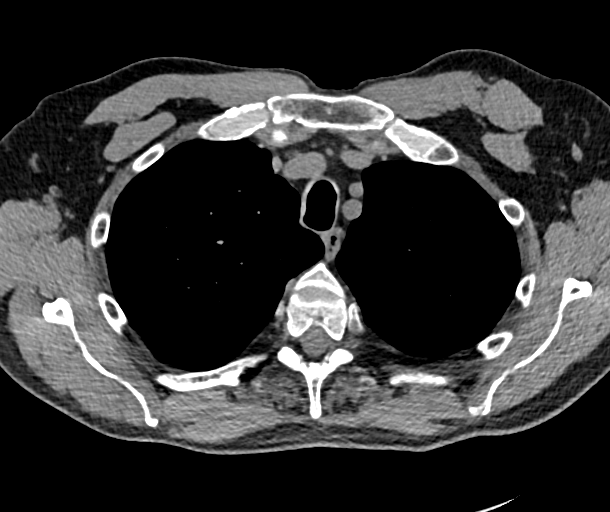
[im 124/161  lung]
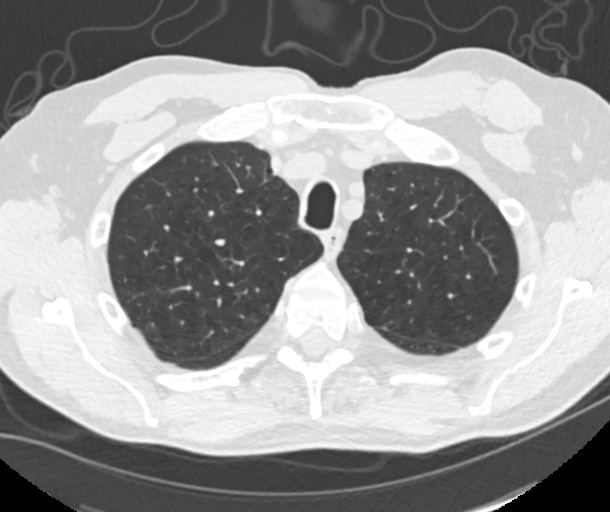
[im 136/161  lung]
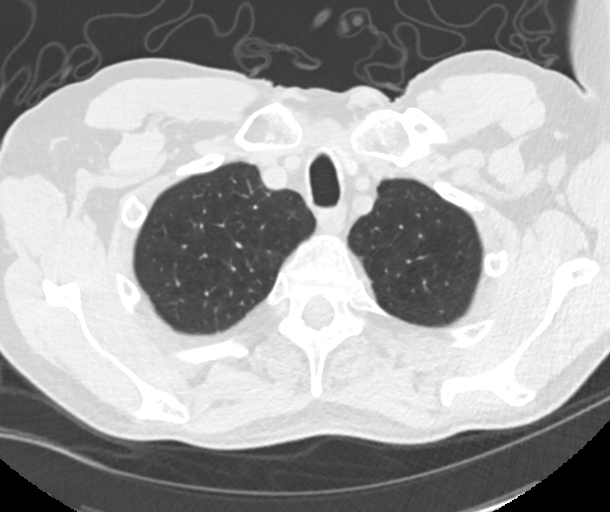
[im 148/161  lung]
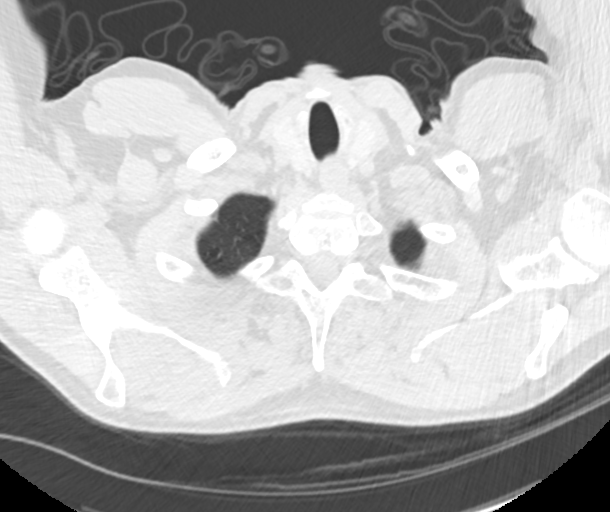

[Series 4: chest 2.00 br40 s3 cor · coronal · 0.63mm/px · 3 of 146 slices shown]
[im 30/146  lung]
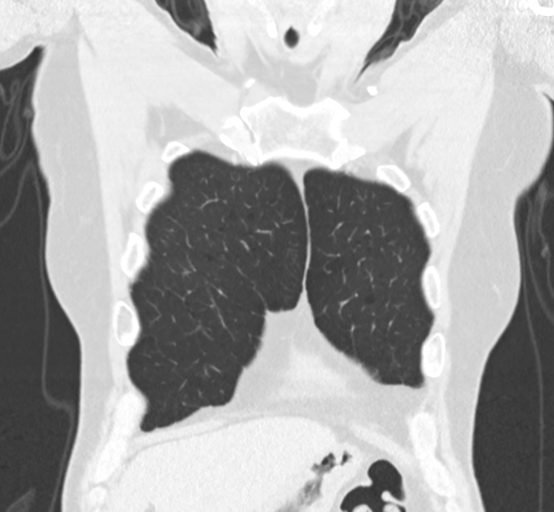
[im 59/146  lung]
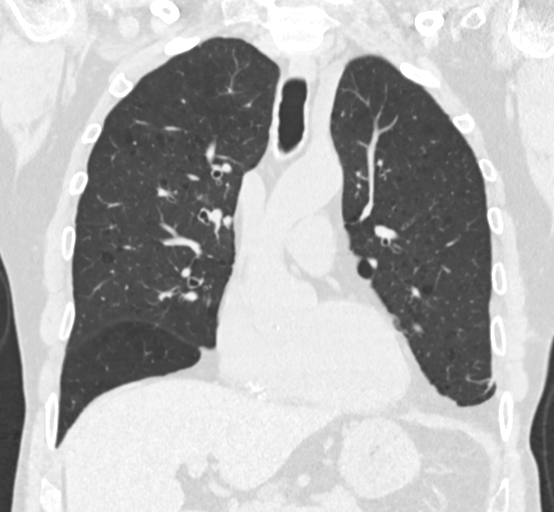
[im 88/146  lung]
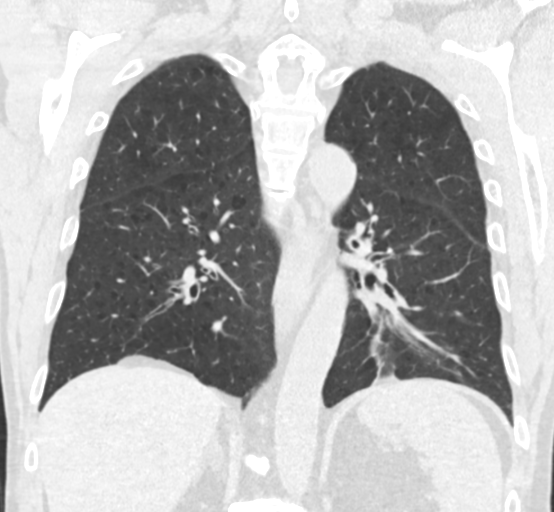

[14 of 36 positions shown; findings below may reference images not displayed]

FINDINGS: Cardiovascular: Coronary artery calcification and aortic
atherosclerotic calcification.

Mediastinum/Nodes: No axillary or supraclavicular adenopathy. No
mediastinal hilar adenopathy. No pericardial effusion. Esophagus
normal.

Lungs/Pleura: Mild centrilobular emphysema in the upper lobes.
Linear scarring in the LEFT lower lobe. Mild bronchiectasis and mild
mucoid impaction. These findings are decreased in prominence from CT
[DATE]

No pleural fluid in LEFT or RIGHT.  No pneumothorax.

Upper Abdomen: Limited view of the liver, kidneys, pancreas are
unremarkable. Normal adrenal glands.

Musculoskeletal: Degenerative osteophytosis of the spine.
IMPRESSION: 1. Decrease in the linear scarring with bronchiectasis in the medial
LEFT lung base.
2. No recurrent pleural effusions.
3. Coronary artery calcification and Aortic Atherosclerosis
([TR]-[TR]).

Aortic Atherosclerosis ([TR]-[TR]) and Emphysema ([TR]-[TR]).

## 2018-08-12 ENCOUNTER — Other Ambulatory Visit: Payer: Self-pay

## 2018-08-12 ENCOUNTER — Other Ambulatory Visit: Payer: Medicaid Other

## 2018-08-12 ENCOUNTER — Ambulatory Visit: Payer: Medicaid Other | Admitting: Thoracic Surgery (Cardiothoracic Vascular Surgery)

## 2018-08-12 VITALS — BP 119/83 | HR 68 | Resp 20 | Ht 74.0 in | Wt 198.0 lb

## 2018-08-12 DIAGNOSIS — J869 Pyothorax without fistula: Secondary | ICD-10-CM

## 2018-08-12 DIAGNOSIS — J90 Pleural effusion, not elsewhere classified: Secondary | ICD-10-CM | POA: Diagnosis not present

## 2018-08-12 NOTE — Progress Notes (Signed)
Dan Salazar [address]       [place] [address]             [phone]      HPI: Mr. Dan Salazar returns for follow-up  Dan Salazar is a 58 year old gentleman with a history of hypertension, chronic kidney disease, pneumonia, empyema, and dementia.  He was admitted in April 2019 with an aspiration pneumonia and septic shock.  He had complete occlusion of his left mainstem bronchus and an empyema.  I did bronchoscopy and left VATS for decortication on November 03, 2017.  There was extrinsic compression of his lower lobe bronchi but no endobronchial lesions were seen.  I saw him back in the office in July.  He had a repeat CT at that time.  There was fullness in the left hilar region at the level where the lower lobe bronchus bifurcates.  There was felt to be some adenopathy and possible some mucus impaction as well.  He now returns with a repeat scan to reevaluate that area.  He says he is not having any problems with his breathing.  He is accompanied by a friend who says that his dementia remains problematic.  Past Medical History:  Diagnosis Date  . C. difficile diarrhea    hx..resolved  . Dementia ([place])    cogentive ability  . Dyspnea   . Hypertension   . RLS (restless legs syndrome)     Current Outpatient Medications  Medication Sig Dispense Refill  . ipratropium-albuterol (DUONEB) 0.5-2.5 (3) MG/3ML SOLN Take 3 mLs by nebulization every 6 (six) hours as needed. 360 mL 0  . lisinopril (PRINIVIL,ZESTRIL) 20 MG tablet Take 20 mg by mouth 3 (three) times daily.   3  . thiamine (VITAMIN B-1) 100 MG tablet Take 1 tablet (100 mg total) by mouth daily. 30 tablet 0  . traMADol (ULTRAM) 50 MG tablet Take 1 tablet (50 mg total) by mouth every 6 (six) hours as needed for moderate pain. 15 tablet 0  . vitamin B-12 (CYANOCOBALAMIN) 100 MCG tablet Take 1 tablet (100 mcg total) by mouth daily. 100 tablet 3   No current facility-administered medications for this visit.     Physical  Exam BP 119/83   Pulse 68   Resp 20   Ht 6\' 2"  (1.88 m)   Wt 198 lb (89.8 kg)   SpO2 97%   BMI 25.109 kg/m  58 year old man in no acute distress Alert and oriented x3 with no focal deficits Lungs clear with equal breath sounds bilaterally, no wheezing Cardiac regular rate and rhythm  Diagnostic Tests: CT CHEST WITHOUT CONTRAST  TECHNIQUE: Multidetector CT imaging of the chest was performed following the standard protocol without IV contrast.  COMPARISON:  02/11/2018  FINDINGS: Cardiovascular: Coronary artery calcification and aortic atherosclerotic calcification.  Mediastinum/Nodes: No axillary or supraclavicular adenopathy. No mediastinal hilar adenopathy. No pericardial effusion. Esophagus normal.  Lungs/Pleura: Mild centrilobular emphysema in the upper lobes. Linear scarring in the LEFT lower lobe. Mild bronchiectasis and mild mucoid impaction. These findings are decreased in prominence from CT 02/11/2018  No pleural fluid in LEFT or RIGHT.  No pneumothorax.  Upper Abdomen: Limited view of the liver, kidneys, pancreas are unremarkable. Normal adrenal glands.  Musculoskeletal: Degenerative osteophytosis of the spine.  IMPRESSION: 1. Decrease in the linear scarring with bronchiectasis in the medial LEFT lung base. 2. No recurrent pleural effusions. 3. Coronary artery calcification and Aortic Atherosclerosis (ICD10-I70.0).  Aortic Atherosclerosis (ICD10-I70.0) and Emphysema (ICD10-J43.9).   Electronically  Signed   By: Suzy Bouchard M.D.   On: 08/08/2018 11:38 I personally reviewed the CT images and concur with the findings noted above  Impression: Dan Salazar is a 58 year old non-smoker with a history of hypertension, chronic kidney disease, dementia, aspiration pneumonia and empyema.  He was admitted in April 2019 with aspiration pneumonia and sepsis.  He had a large empyema.  He had complete white out of the left lung with total obstruction  of his left mainstem bronchus.  That turned out to be due to mucous plugging.  He had VATS with decortication.  His CT today shows complete resolution of his empyema.  He has continued evolution of his left lower lobe scarring.  The left hilar fullness has improved significantly I suspect this was to a large degree inspissated mucus.  He does have some bronchiectasis in that area.  There is no evidence of endobronchial mass lesion or lung nodules.  No further follow-up is needed.  Plan: I will be happy to see [name] back at anytime in the future if I can be of any further assistance with his care.  Melrose Nakayama, MD [facility] [phone]

## 2018-09-12 ENCOUNTER — Encounter: Payer: Self-pay | Admitting: Neurology

## 2018-11-04 ENCOUNTER — Encounter: Payer: Self-pay | Admitting: Neurology

## 2018-11-11 ENCOUNTER — Other Ambulatory Visit: Payer: Self-pay

## 2018-11-13 ENCOUNTER — Telehealth (INDEPENDENT_AMBULATORY_CARE_PROVIDER_SITE_OTHER): Payer: Medicaid Other | Admitting: Neurology

## 2018-11-13 ENCOUNTER — Other Ambulatory Visit: Payer: Self-pay

## 2018-11-13 ENCOUNTER — Encounter: Payer: Self-pay | Admitting: Neurology

## 2018-11-13 ENCOUNTER — Other Ambulatory Visit: Payer: Self-pay | Admitting: *Deleted

## 2018-11-13 DIAGNOSIS — G300 Alzheimer's disease with early onset: Secondary | ICD-10-CM

## 2018-11-13 DIAGNOSIS — R413 Other amnesia: Secondary | ICD-10-CM

## 2018-11-13 DIAGNOSIS — F0280 Dementia in other diseases classified elsewhere without behavioral disturbance: Secondary | ICD-10-CM

## 2018-11-13 MED ORDER — DONEPEZIL HCL 10 MG PO TABS: ORAL_TABLET | ORAL | 11 refills | Status: DC

## 2018-11-13 NOTE — Progress Notes (Addendum)
Virtual Visit via Video Note The purpose of this virtual visit is to provide medical care while limiting exposure to the novel coronavirus.    Consent was obtained for video visit:  Yes.   Answered questions that patient had about telehealth interaction:  Yes.   I discussed the limitations, risks, security and privacy concerns of performing an evaluation and management service by telemedicine. I also discussed with the patient that there may be a patient responsible charge related to this service. The patient expressed understanding and agreed to proceed.  Pt location: Home Physician Location: office Name of referring provider:  Mardi Mainland,* I connected with Dan Salazar at patients initiation/request on 11/13/2018 at  9:00 AM EDT by video enabled telemedicine application and verified that I am speaking with the correct person using two identifiers. Pt MRN:  [id number] Pt DOB:  Sep 05, 1960 Video Participants:  Dan Salazar;  Dan Salazar (friend, POA)  History of Present Illness: This is a pleasant 58 year old right-handed man with a history of hypertension, hyperlipidemia, presenting for evaluation of dementia. He reports his memory "ain't as good as it used to be." He reports symptoms started a year ago, however his POA Dan Salazar reports that symptoms started in 2017. 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 In 2017, they started noticing that he would be sent for a job on [place] but he would end up in [place]. This happened 2-3 times, or they would get calls from a customer that he had no idea what he was doing there. Dan Salazar and his church started helping him with bills, they realized some bills were way past due date. They started a disability process for him in 2017 which was granted after SS assessment in 2018. Dan Salazar states he was not diagnosed with dementia,  but by a "long medical term that means wrinkling of the brain." Dan Salazar reports he has not been evaluated by a neurologist and as far as he know, no brain imaging has been done. He was admitted for pneumonia complicated by pleural effusion and lung collapse in 2019. Dan Salazar reports that they were told that his nutritional status contributed to this, he was not eating correctly. [employer] and [place] started helping him with nutrition. He stopped driving in 7846. He manages his own medications, Dan Salazar tries to check behind him and call to remind him. He endorses a lot of word-finding difficulties. He is able to bathe and dress independently, no hygiene concerns per Dan Salazar, he does his own laundry. He reports his mood is pretty good, no personality changes, paranoia or hallucinations. As far as Dan Salazar know, no wandering behavior at night. He reports sleep is sometimes better than other times, no daytime drowsiness. He lives alone in a [place] house. He is estranged from his daughter and grandson who live in [place], his mother lives in [place].   He has occasional horizontal diplopia and mild back pain. Otherwise he denies any headaches, dizziness, dysarthria/dysphagia, neck pain, focal numbness/tingling/weakness, bowel/bladder dysfunction, anosmia, or tremors. His mother has memory loss. He denies any significant head injuries. He drinks beer occasionally.    Active Ambulatory Problems    Diagnosis Date Noted  . Hypokalemia 09/28/2016  . Hyponatremia   . Metabolic alkalosis   . Essential hypertension   . Incarcerated inguinal hernia 04/30/2017  . Pleural effusion on left 10/31/2017  . Tachycardia 10/31/2017  . Pneumonia   .  Atelectasis   . Difficult intravenous access   . Encounter for intubation   . Acute respiratory failure with hypoxia ([place])   . S/P right inguinal hernia repair 05/23/2018   Resolved Ambulatory Problems    Diagnosis Date Noted  . No Resolved Ambulatory Problems   Past Medical History:   Diagnosis Date  . C. difficile diarrhea   . Dementia ([place])   . Dyspnea   . Hypertension   . RLS (restless legs syndrome)    Social History   Socioeconomic History  . Marital status: Legally Separated    Spouse name: Not on file  . Number of children: Not on file  . Years of education: Not on file  . Highest education level: Not on file  Occupational History  . Occupation: disabled  Social Needs  . Financial resource strain: Not on file  . Food insecurity:    Worry: Not on file    Inability: Not on file  . Transportation needs:    Medical: Not on file    Non-medical: Not on file  Tobacco Use  . Smoking status: Never Smoker  . Smokeless tobacco: Never Used  Substance and Sexual Activity  . Alcohol use: Yes    Comment: social  . Drug use: No  . Sexual activity: Yes  Lifestyle  . Physical activity:    Days per week: Not on file    Minutes per session: Not on file  . Stress: Not on file  Relationships  . Social connections:    Talks on phone: Not on file    Gets together: Not on file    Attends religious service: Not on file    Active member of club or organization: Not on file    Attends meetings of clubs or organizations: Not on file    Relationship status: Not on file  . Intimate partner violence:    Fear of current or ex partner: Not on file    Emotionally abused: Not on file    Physically abused: Not on file    Forced sexual activity: Not on file  Other Topics Concern  . Not on file  Social History Narrative   One-story home   History reviewed. No pertinent family history. Allergies  Allergen Reactions  . Hctz [Hydrochlorothiazide] Other (See Comments)    Severe electrolyte abnormalities   Current Outpatient Medications on File Prior to Visit  Medication Sig Dispense Refill  . ipratropium-albuterol (DUONEB) 0.5-2.5 (3) MG/3ML SOLN Take 3 mLs by nebulization every 6 (six) hours as needed. 360 mL 0  . thiamine (VITAMIN B-1) 100 MG tablet Take 1 tablet  (100 mg total) by mouth daily. 30 tablet 0  . traMADol (ULTRAM) 50 MG tablet Take 1 tablet (50 mg total) by mouth every 6 (six) hours as needed for moderate pain. 15 tablet 0   No current facility-administered medications on file prior to visit.     Observations/Objective:  Patient is awake, alert, oriented x 3. No aphasia or dysarthria. Decreased fluency, able to follow 2-step commands. Remote and recent memory impaired. Able to name and repeat. Cranial nerves: pupils equal, round. Extraocular movements intact with no nystagmus. No facial asymmetry. Motor: moves all extremities symmetrically. No incoordination on finger to nose testing. Gait: narrow-based and steady, able to tandem walk adequately. Negative Romberg test.  Assessment and Plan:   This is a pleasant 58 year old right-handed man with a history of hypertension, hyperlipidemia, and dementia with symptoms starting in 2017. His MOCA score  is 20/30. Etiology of dementia unclear, suggestive of early-onset Alzheimer's dementia. His POA reports that brain imaging has never been done, recommend MRI brain with and without contrast. Bloodwork from PCP will be requested for review, if not done, will order TSH and B12. We discussed expectations and side effects from medications for dementia, he is agreeable to start Donepezil 10mg  1/2 tablet daily for 2 weeks, then increase to 1 tablet daily. We discussed home safety, he does not have family to help, friends and church have been helping him. We discussed the need for advanced care planning and long-term planning in light of dementia diagnosis, he will eventually need 24/7 supervision. Follow-up in 4-6 months, they know to call for any changes.  Follow Up Instructions:   -I discussed the assessment and treatment plan with the patient/POA. The patient/POA were provided an opportunity to ask questions and all were answered. The patient/POA agreed with the plan and demonstrated an understanding of the  instructions.   The patient/POA were advised to call back or seek an in-person evaluation if the symptoms worsen or if the condition fails to improve as anticipated.  Total Time spent in visit with the patient was:  50 minutes, of which more than 50% of the time was spent in counseling and/or coordinating care on the above.   Pt understands and agrees with the plan of care outlined.     Cameron Sprang, MD

## 2018-11-13 NOTE — Progress Notes (Signed)
MRI order placed.  Called patient's PCP about labs and had to leave voicemail.  Waiting for them to call me back.

## 2018-11-14 ENCOUNTER — Encounter: Payer: Self-pay | Admitting: *Deleted

## 2018-12-02 ENCOUNTER — Encounter

## 2018-12-02 ENCOUNTER — Ambulatory Visit: Payer: Medicaid Other | Admitting: Neurology

## 2020-02-25 ENCOUNTER — Ambulatory Visit: Payer: Medicaid Other | Admitting: Pulmonary Disease

## 2020-02-25 ENCOUNTER — Encounter: Payer: Self-pay | Admitting: Pulmonary Disease

## 2020-02-25 ENCOUNTER — Other Ambulatory Visit: Payer: Self-pay

## 2020-02-25 VITALS — BP 130/86 | HR 101 | Temp 98.1°F | Ht 74.0 in | Wt 229.0 lb

## 2020-02-25 DIAGNOSIS — J438 Other emphysema: Secondary | ICD-10-CM

## 2020-02-25 NOTE — Progress Notes (Signed)
Dan Salazar    [id number]    05-02-1961  Primary Care Physician:Brown, Aurora Mask, FNP  Referring Physician: Mardi Mainland, [address] [place],  [place] [address]  Chief complaint:   Patient being seen for his breathing Needs spirometry performed  HPI:  Patient is trying to get his driver's license back Requires spirometry to evaluate pulmonary functions  Denies any significant symptoms at present Not short of breath with normal activity  Used to be able to run a mile but hasn't done so in a while Is able to walk a mile if he chooses to  May be able to make one flight of steps before it starts getting short of breath  Denies any chest pains or chest discomfort  He was seen by cardiothoracic surgery about a year ago for empyema for which he had VATS and decortication Denies any respiratory complaints at rest  He has a history of hypertension, chronic kidney disease, pneumonia empyema in the past  History of dementia  Never smoked cigarettes  Worked construction-flooring  Has no weight loss  Outpatient Encounter Medications as of 02/25/2020  Medication Sig  . donepezil (ARICEPT) 10 MG tablet Take 1/2 tablet daily for 2 weeks, then increase to 1 tablet daily  . ipratropium-albuterol (DUONEB) 0.5-2.5 (3) MG/3ML SOLN Take 3 mLs by nebulization every 6 (six) hours as needed.  Marland Kitchen lisinopril (ZESTRIL) 40 MG tablet TAKE 1/2 TABLET (20 MG) BY ORAL ROUTE ONCE DAILY FOR 30 DAYS  . thiamine (VITAMIN B-1) 100 MG tablet Take 1 tablet (100 mg total) by mouth daily.  . tamsulosin (FLOMAX) 0.4 MG CAPS capsule Take 0.4 mg by mouth. (Patient not taking: Reported on 02/25/2020)  . traMADol (ULTRAM) 50 MG tablet Take 1 tablet (50 mg total) by mouth every 6 (six) hours as needed for moderate pain. (Patient not taking: Reported on 02/25/2020)   No facility-administered encounter medications on file as of 02/25/2020.    Allergies as of 02/25/2020 - Review  Complete 02/25/2020  Allergen Reaction Noted  . Hctz [hydrochlorothiazide] Other (See Comments) 09/29/2016    Past Medical History:  Diagnosis Date  . C. difficile diarrhea    hx..resolved  . Dementia ([place])    cogentive ability  . Dyspnea   . Hypertension   . RLS (restless legs syndrome)     Past Surgical History:  Procedure Laterality Date  . FLEXIBLE BRONCHOSCOPY Left 11/03/2017   Procedure: FLEXIBLE BRONCHOSCOPY;  Surgeon: Melrose Nakayama, MD;  Location: [facility];  Service: Thoracic;  Laterality: Left;  . HERNIA REPAIR     Lap diagnostic inguinal hernia repar 04/30/17 Dr. Greer Pickerel  . INGUINAL HERNIA REPAIR Left 04/30/2017   Procedure: OPEN REPAIR MASSIVE INCARCERATED LEFT INGUINAL HERNIA REPAIR WITH MESH;  Surgeon: Greer Pickerel, MD;  Location: [facility];  Service: General;  Laterality: Left;  . INGUINAL HERNIA REPAIR  05/23/2018  . INGUINAL HERNIA REPAIR Right 05/23/2018   Procedure: OPEN RIGHT INGUINAL HERNIA REPAIR ERAS PATHWAY;  Surgeon: Greer Pickerel, MD;  Location: [facility];  Service: General;  Laterality: Right;  . INSERTION OF MESH Left 04/30/2017   Procedure: INSERTION OF MESH;  Surgeon: Greer Pickerel, MD;  Location: [facility];  Service: General;  Laterality: Left;  . INSERTION OF MESH Right 05/23/2018   Procedure: INSERTION OF MESH;  Surgeon: Greer Pickerel, MD;  Location: [facility];  Service: General;  Laterality: Right;  . LAPAROSCOPY N/A 04/30/2017   Procedure: LAPAROSCOPY DIAGNOSTIC;  Surgeon: Greer Pickerel, MD;  Location: [facility];  Service: General;  Laterality: N/A;  . OPEN REDUCTION INTERNAL FIXATION (ORIF) DISTAL RADIAL FRACTURE Left 03/02/2013   Procedure: OPEN REDUCTION INTERNAL FIXATION (ORIF) LEFT DISTAL RADIAL FRACTURE;  Surgeon: Jolyn Nap, MD;  Location: [facility];  Service: Orthopedics;  Laterality: Left;  Marland Kitchen VIDEO ASSISTED THORACOSCOPY (VATS)/EMPYEMA Left 11/03/2017   Procedure: VIDEO ASSISTED THORACOSCOPY (VATS) FOR DRAINAGE OF PLEURAL EFFUSION;   Surgeon: Melrose Nakayama, MD;  Location: [facility];  Service: Thoracic;  Laterality: Left;    History reviewed. No pertinent family history.  Social History   Socioeconomic History  . Marital status: Divorced    Spouse name: Not on file  . Number of children: Not on file  . Years of education: Not on file  . Highest education level: Not on file  Occupational History  . Occupation: disabled  Tobacco Use  . Smoking status: Never Smoker  . Smokeless tobacco: Never Used  Vaping Use  . Vaping Use: Never used  Substance and Sexual Activity  . Alcohol use: Yes    Comment: social  . Drug use: No  . Sexual activity: Yes  Other Topics Concern  . Not on file  Social History Narrative   One-story home   Social Determinants of Health   Financial Resource Strain:   . Difficulty of Paying Living Expenses:   Food Insecurity:   . Worried About Charity fundraiser in the Last Year:   . Arboriculturist in the Last Year:   Transportation Needs:   . Film/video editor (Medical):   Marland Kitchen Lack of Transportation (Non-Medical):   Physical Activity:   . Days of Exercise per Week:   . Minutes of Exercise per Session:   Stress:   . Feeling of Stress :   Social Connections:   . Frequency of Communication with Friends and Family:   . Frequency of Social Gatherings with Friends and Family:   . Attends Religious Services:   . Active Member of Clubs or Organizations:   . Attends Archivist Meetings:   Marland Kitchen Marital Status:   Intimate Partner Violence:   . Fear of Current or Ex-Partner:   . Emotionally Abused:   Marland Kitchen Physically Abused:   . Sexually Abused:     Review of Systems  Respiratory: Negative for cough and shortness of breath.     Vitals:   02/25/20 0926  BP: (!) 130/86  Pulse: 101  Temp: 98.1 F (36.7 C)  SpO2: 92%     Physical Exam Constitutional:      Appearance: Normal appearance.  HENT:     Head: Normocephalic and atraumatic.     Nose: No congestion or  rhinorrhea.     Mouth/Throat:     Mouth: Mucous membranes are moist.     Pharynx: No oropharyngeal exudate or posterior oropharyngeal erythema.  Cardiovascular:     Rate and Rhythm: Regular rhythm. Tachycardia present.     Pulses: Normal pulses.     Heart sounds: Normal heart sounds. No murmur heard.  No friction rub.  Pulmonary:     Effort: Pulmonary effort is normal. No respiratory distress.     Breath sounds: No stridor. No wheezing or rhonchi.     Comments: Decreased air movement at the bases bilaterally Musculoskeletal:     Cervical back: Normal range of motion and neck supple. No rigidity or tenderness.  Skin:    General: Skin is warm.  Neurological:  General: No focal deficit present.     Mental Status: He is alert.  Psychiatric:        Mood and Affect: Mood normal.    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

## 2020-02-25 NOTE — Patient Instructions (Signed)
Schedule for Pap spirometry, repeat spirometry in a week  Will follow-up with him following the second spirometry

## 2020-03-05 ENCOUNTER — Other Ambulatory Visit (HOSPITAL_COMMUNITY)
Admission: RE | Admit: 2020-03-05 | Discharge: 2020-03-05 | Disposition: A | Discharge status: Home / Self Care | Payer: Medicaid Other | Source: Ambulatory Visit | Attending: Adult Health | Admitting: Adult Health

## 2020-03-05 DIAGNOSIS — Z20822 Contact with and (suspected) exposure to covid-19: Secondary | ICD-10-CM | POA: Insufficient documentation

## 2020-03-05 DIAGNOSIS — Z01812 Encounter for preprocedural laboratory examination: Secondary | ICD-10-CM | POA: Insufficient documentation

## 2020-03-05 LAB — SARS CORONAVIRUS 2 (TAT 6-24 HRS): SARS Coronavirus 2: NEGATIVE

## 2020-03-08 ENCOUNTER — Ambulatory Visit: Payer: Medicaid Other | Admitting: Adult Health

## 2020-03-08 ENCOUNTER — Ambulatory Visit (INDEPENDENT_AMBULATORY_CARE_PROVIDER_SITE_OTHER): Payer: Medicaid Other

## 2020-03-08 ENCOUNTER — Encounter: Payer: Self-pay | Admitting: Adult Health

## 2020-03-08 ENCOUNTER — Other Ambulatory Visit: Payer: Self-pay

## 2020-03-08 VITALS — BP 122/68 | HR 78 | Temp 97.8°F | Ht 74.0 in | Wt 228.8 lb

## 2020-03-08 DIAGNOSIS — J449 Chronic obstructive pulmonary disease, unspecified: Secondary | ICD-10-CM

## 2020-03-08 DIAGNOSIS — J189 Pneumonia, unspecified organism: Secondary | ICD-10-CM

## 2020-03-08 DIAGNOSIS — J42 Unspecified chronic bronchitis: Secondary | ICD-10-CM | POA: Diagnosis not present

## 2020-03-08 IMAGING — DX DG CHEST 2V
2 series · 2 of 2 positions shown · non-contrast
Comparison: PA and lateral chest [DATE].  CT chest [DATE].

CLINICAL DATA: Chronic bronchitis and emphysema

EXAM:
CHEST - 2 VIEW

[chest pa]
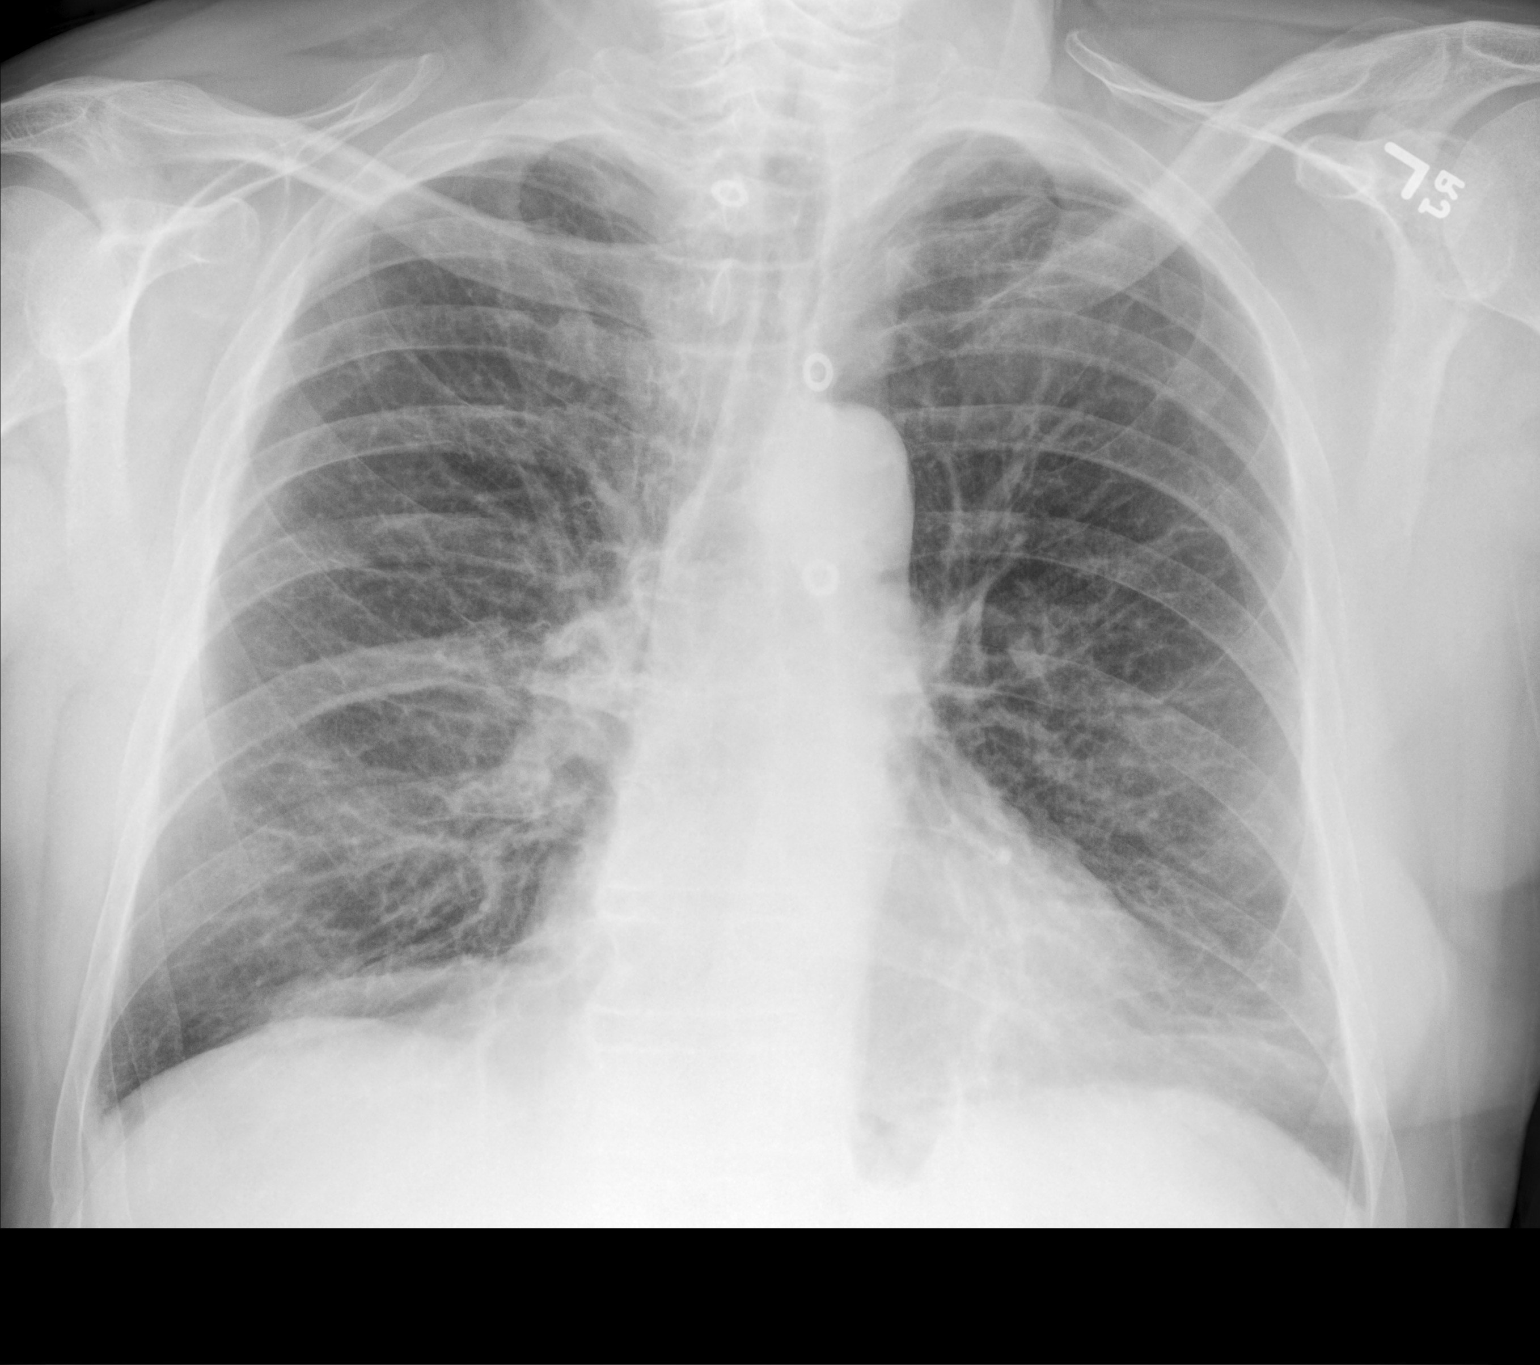

[chest lat]
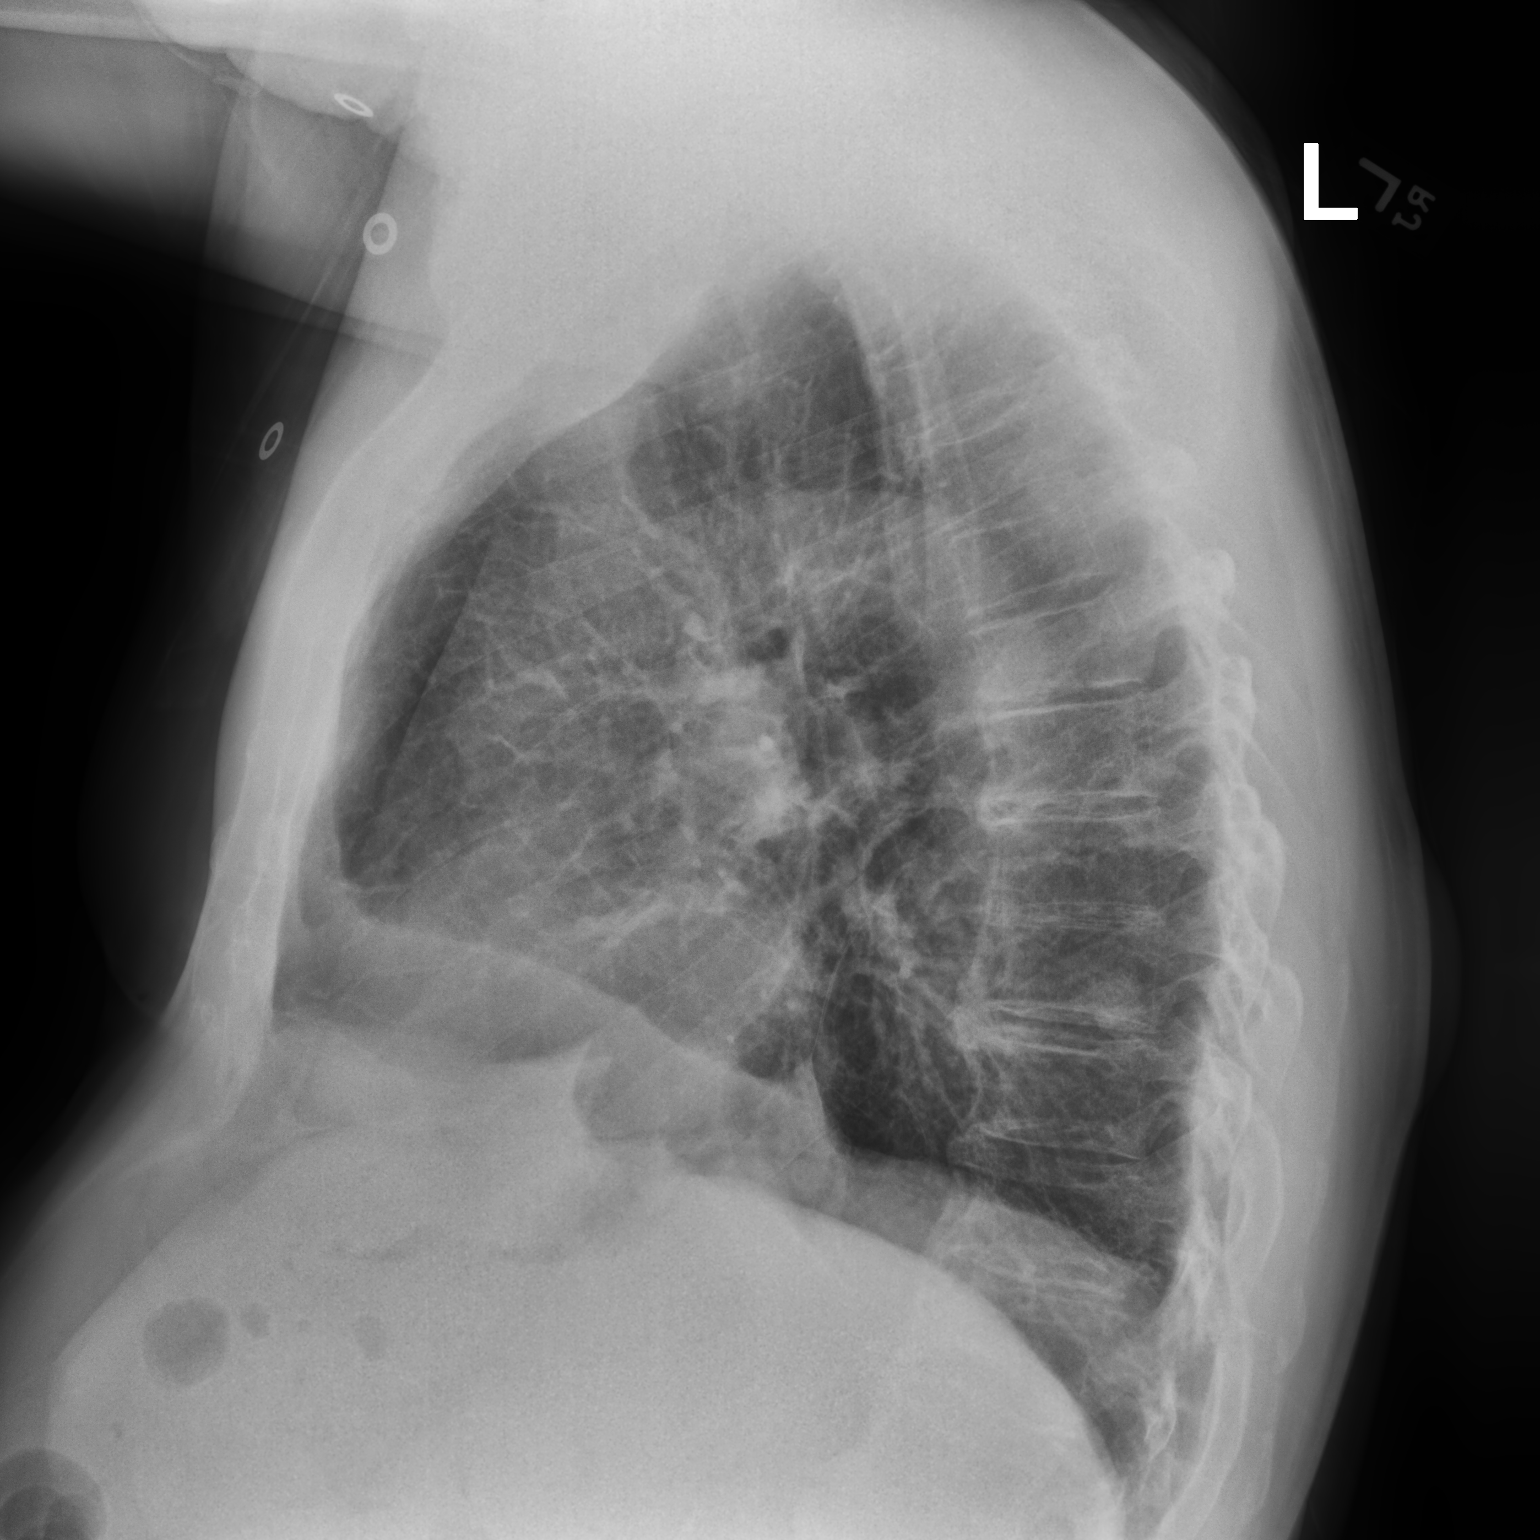

[2 of 2 positions shown; findings below may reference images not displayed]

FINDINGS: The lungs are emphysematous but clear. Heart size is normal. Aortic
atherosclerosis. No pneumothorax or pleural fluid. No acute or focal
bony abnormality.
IMPRESSION: No acute disease.

Aortic Atherosclerosis ([R1]-[R1]) and Emphysema ([R1]-[R1]).

## 2020-03-08 MED ORDER — BREO ELLIPTA 100-25 MCG/INH IN AEPB: 1.0000 | INHALATION_SPRAY | Freq: Every day | RESPIRATORY_TRACT | 5 refills | Status: DC

## 2020-03-08 NOTE — Addendum Note (Signed)
Addended by: Suzzanne Cloud E on: 03/08/2020 10:03 AM   Modules accepted: Orders

## 2020-03-08 NOTE — Progress Notes (Signed)
@Patient  ID: Dan Salazar, male    DOB: 07-Aug-1960, 59 y.o.   MRN: [id number]  Chief Complaint  Patient presents with  . Follow-up    Spirometry     Referring provider: Mardi Mainland  HPI: 59 year old male never smoker seen for pulmonary consult February 25, 2020 to evaluate lung function for DMV. Medical history significant for empyema status post VATS and decortication 2019  TEST/EVENTS :   03/08/2020 Follow up : Spirometry  Patient returns for a 2-week follow-up.  Patient was seen last visit for pulmonary consult to evaluate lung function.  Patient is trying to get his driver's license back and be able to drive once again.  He has been referred by the [facility] to be evaluated for an interlock system.  Patient is required to have a spirometry today.  This showed severe airflow obstruction with FEV1 at 40%, ratio 46, FVC 64%.  Patient says he has no significant cough or wheezing.  Does get short of breath with activities.  Says he tries to remain active. He denies any diagnosis of asthma.  Previous CT chest has shown emphysema. As above patient is a never smoker.  Was exposed to second hand smoke. Worked in [employer].  Also smoked marijuana for about 30 years on average once weekly.  Also had exposure to wood stove heating Patient has a history of alcohol use but has not used in a few years.    Allergies  Allergen Reactions  . Hctz [Hydrochlorothiazide] Other (See Comments)    Severe electrolyte abnormalities    Immunization History  Administered Date(s) Administered  . Moderna SARS-COVID-2 Vaccination 02/23/2020    Past Medical History:  Diagnosis Date  . C. difficile diarrhea    hx..resolved  . 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 Dyspnea   . Hypertension   . RLS (restless legs syndrome)     Tobacco History: Social History   Tobacco Use  Smoking Status Never Smoker  Smokeless Tobacco Never Used   Counseling given: Not Answered   Outpatient  Medications Prior to Visit  Medication Sig Dispense Refill  . donepezil (ARICEPT) 10 MG tablet Take 1/2 tablet daily for 2 weeks, then increase to 1 tablet daily 30 tablet 11  . ipratropium-albuterol (DUONEB) 0.5-2.5 (3) MG/3ML SOLN Take 3 mLs by nebulization every 6 (six) hours as needed. 360 mL 0  . lisinopril (ZESTRIL) 40 MG tablet TAKE 1/2 TABLET (20 MG) BY ORAL ROUTE ONCE DAILY FOR 30 DAYS    . tamsulosin (FLOMAX) 0.4 MG CAPS capsule Take 0.4 mg by mouth.     . thiamine (VITAMIN B-1) 100 MG tablet Take 1 tablet (100 mg total) by mouth daily. 30 tablet 0  . traMADol (ULTRAM) 50 MG tablet Take 1 tablet (50 mg total) by mouth every 6 (six) hours as needed for moderate pain. 15 tablet 0   No facility-administered medications prior to visit.     Review of Systems:   Constitutional:   No  weight loss, night sweats,  Fevers, chills, fatigue, or  lassitude.  HEENT:   No headaches,  Difficulty swallowing,  Tooth/dental problems, or  Sore throat,                No sneezing, itching, ear ache, nasal congestion, post nasal drip,   CV:  No chest pain,  Orthopnea, PND, swelling in lower extremities, anasarca, dizziness, palpitations, syncope.   GI  No heartburn, indigestion, abdominal pain, nausea, vomiting, diarrhea, change in bowel habits,  loss of appetite, bloody stools.   Resp:   No excess mucus, no productive cough,  No non-productive cough,  No coughing up of blood.  No change in color of mucus.  No wheezing.  No chest wall deformity  Skin: no rash or lesions.  GU: no dysuria, change in color of urine, no urgency or frequency.  No flank pain, no hematuria   MS:  No joint pain or swelling.  No decreased range of motion.  No back pain.    Physical Exam  BP 122/68   Pulse 78   Temp 97.8 F (36.6 C) (Oral)   Ht 6\' 2"  (1.88 m)   Wt 228 lb 12.8 oz (103.8 kg)   SpO2 94%   BMI 29.38 kg/m   GEN: A/Ox3; pleasant , NAD, well nourished    HEENT:  [place]/AT,  NOSE-clear, THROAT-clear, no  lesions, no postnasal drip or exudate noted.   NECK:  Supple w/ fair ROM; no JVD; normal carotid impulses w/o bruits; no thyromegaly or nodules palpated; no lymphadenopathy.    RESP  Clear  P & A; w/o, wheezes/ rales/ or rhonchi. no accessory muscle use, no dullness to percussion  CARD:  RRR, no m/r/g, no peripheral edema, pulses intact, no cyanosis or clubbing.  GI:   Soft & nt; nml bowel sounds; no organomegaly or masses detected.   Musco: Warm bil, no deformities or joint swelling noted.   Neuro: alert, no focal deficits noted.    Skin: Warm, no lesions or rashes    Lab Results:   ProBNP No results found for: PROBNP  Imaging: No results found.    No flowsheet data found.  No results found for: NITRICOXIDE      Assessment & Plan:   Chronic obstructive lung disease ([place]) Severe airflow obstruction.  Suspect patient may have an asthma component .  Will check full PFTs on return.  Emphysema was also noted.  Patient is a never smoker.  Check alpha-1 testing. Chest x-ray today.  Empiric trial of ICS/LABA  DMV paperwork was completed and copies scanned to epic.   Plan  Patient Instructions  Begin Breo 100  1 puff daily , rinse after use.  Labs today .  Chest xray today  Follow up with Dr. Hermina Staggers in 6-8 weeks with PFT and As needed           Rexene Edison, NP 03/08/2020

## 2020-03-08 NOTE — Assessment & Plan Note (Addendum)
Severe airflow obstruction.  Suspect patient may have an asthma component .  Will check full PFTs on return.  Emphysema was also noted.  Patient is a never smoker.  Check alpha-1 testing. Chest x-ray today.  Empiric trial of ICS/LABA  DMV paperwork was completed and copies scanned to epic.   Plan  Patient Instructions  Begin Breo 100  1 puff daily , rinse after use.  Labs today .  Chest xray today  Follow up with Dr. Hermina Staggers in 6-8 weeks with PFT and As needed

## 2020-03-08 NOTE — Patient Instructions (Addendum)
Begin Breo 100  1 puff daily , rinse after use.  Labs today .  Chest xray today  Follow up with Dr. Hermina Staggers in 6-8 weeks with PFT and As needed

## 2020-03-09 ENCOUNTER — Ambulatory Visit (INDEPENDENT_AMBULATORY_CARE_PROVIDER_SITE_OTHER): Payer: Medicaid Other | Admitting: Pulmonary Disease

## 2020-03-09 ENCOUNTER — Other Ambulatory Visit: Payer: Self-pay | Admitting: Pulmonary Disease

## 2020-03-09 DIAGNOSIS — R0600 Dyspnea, unspecified: Secondary | ICD-10-CM

## 2020-03-09 DIAGNOSIS — R0609 Other forms of dyspnea: Secondary | ICD-10-CM

## 2020-03-09 LAB — PULMONARY FUNCTION TEST
FEF 25-75 Pre: 0.82 L/sec
FEF2575-%Pred-Pre: 24 %
FEV1-%Pred-Pre: 45 %
FEV1-Pre: 1.88 L
FEV1FVC-%Pred-Pre: 72 %
FEV6-%Pred-Pre: 64 %
FEV6-Pre: 3.34 L
FEV6FVC-%Pred-Pre: 101 %
FVC-%Pred-Pre: 63 %
FVC-Pre: 3.43 L
Pre FEV1/FVC ratio: 55 %
Pre FEV6/FVC Ratio: 97 %

## 2020-03-09 NOTE — Progress Notes (Signed)
Spirometry done today. 

## 2020-03-16 LAB — ALPHA-1 ANTITRYPSIN PHENOTYPE: A-1 Antitrypsin, Ser: 149 mg/dL (ref 83–199)

## 2020-03-17 NOTE — Progress Notes (Signed)
Spoke with Dan Salazar, listed on DPR, provided the Alpha-1 antitrypsin  results per [facility]. He verbalized understanding.  Nothing further needed.

## 2020-03-24 ENCOUNTER — Ambulatory Visit: Payer: Medicaid Other | Admitting: Pulmonary Disease

## 2020-04-08 ENCOUNTER — Telehealth: Payer: Self-pay | Admitting: Pulmonary Disease

## 2020-04-08 NOTE — Telephone Encounter (Signed)
PA request received from [facility]  Drug requested: Breo CMM Key: [id number] Tried/failed: tried Covered alternatives:  PA request has been sent to plan, and a determination is expected within 3 days.   Routing to Dr. Ander Slade for follow-up.

## 2020-04-12 NOTE — Telephone Encounter (Signed)
PA FORM RECEIVED BACK FOR BREO, THIS HAS BEEN DENIED.  PAPERS PLACED IN AO LOOK AT.

## 2020-04-14 ENCOUNTER — Other Ambulatory Visit: Payer: Self-pay | Admitting: Pulmonary Disease

## 2020-04-14 MED ORDER — BUDESONIDE-FORMOTEROL FUMARATE 160-4.5 MCG/ACT IN AERO: 2.0000 | INHALATION_SPRAY | Freq: Two times a day (BID) | RESPIRATORY_TRACT | 6 refills | Status: AC

## 2020-04-14 NOTE — Telephone Encounter (Signed)
I will be okay sending him any other combination inhaler covered by his insurance

## 2020-04-14 NOTE — Telephone Encounter (Signed)
Plan follows Traditional Medicaid guidelines. Breo is non-preferred. Would be covered if patient has tried 2 preferred.  Preferred agents are Advair HFA/Diskus, Dulera, Symbicort.

## 2020-04-14 NOTE — Telephone Encounter (Signed)
I can not locate any forms  Can pharm team please help with running test claims for combination inhaler? Thanks

## 2020-04-14 NOTE — Telephone Encounter (Signed)
Symbicort sent into pharmacy.  

## 2020-04-14 NOTE — Progress Notes (Signed)
symbicort sent in to pharmacy

## 2020-04-15 NOTE — Telephone Encounter (Signed)
Spoke with the pt's POA [name] ok per DPR and notified of new inhaler change  He verbalized understanding  Nothing further needed

## 2020-05-12 ENCOUNTER — Other Ambulatory Visit: Payer: Self-pay | Admitting: Urology

## 2020-05-12 DIAGNOSIS — C61 Malignant neoplasm of prostate: Secondary | ICD-10-CM

## 2020-06-07 ENCOUNTER — Ambulatory Visit
Admission: RE | Admit: 2020-06-07 | Discharge: 2020-06-07 | Disposition: A | Discharge status: Home / Self Care | Payer: Medicaid Other | Source: Ambulatory Visit | Attending: Urology | Admitting: Urology

## 2020-06-07 DIAGNOSIS — C61 Malignant neoplasm of prostate: Secondary | ICD-10-CM

## 2020-06-07 IMAGING — MR MR PROSTATE WO/W CM
12 series · 48 of 48 positions shown · IV contrast (multihance)
Comparison: None.

CLINICAL DATA: Prostate biopsy [DATE] demonstrates prostate
adenocarcinoma within LEFT and RIGHT gland. Gleason 3+3=6
adenocarcinoma within the LEFT apex, LEFT lateral apex, LEFT mid
gland, and RIGHT mid gland

EXAM:
MR PROSTATE WITHOUT AND WITH CONTRAST
TECHNIQUE: Multiplanar multisequence MRI images were obtained of the pelvis
centered about the prostate. Pre and post contrast images were
obtained.
CONTRAST:  20mL MULTIHANCE GADOBENATE DIMEGLUMINE 529 MG/ML IV SOLN

[Series 3: T2 · coronal · 3.0mm · 0.56mm/px · 1 of 23 slices shown (1 of 3)]
[im 1/23]
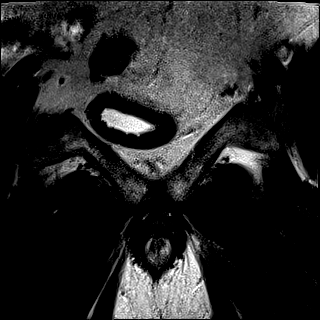

[Series 4: T1 · axial · 5.0mm · 1.25mm/px · 1 of 80 slices shown]
[im 1/80]
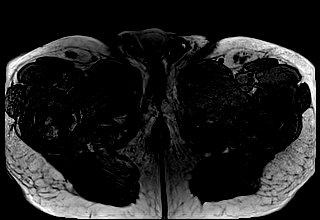

[Series 5: DWI · axial · 3.0mm · 1.75mm/px · z∈[-2,+55]mm · 2 of 60 slices shown (1 of 3)]
[im 1/60]
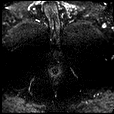
[im 60/60]
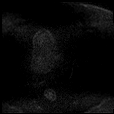

[Series 6: DWI · axial · 3.0mm · 1.75mm/px · 1 of 20 slices shown (2 of 3)]
[im 1/20]
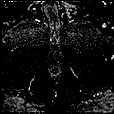

[Series 7: DWI · axial · 3.0mm · 1.75mm/px · 1 of 20 slices shown (3 of 3)]
[im 1/20]
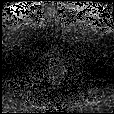

[Series 8: T2 · axial · 3.0mm · 0.56mm/px · 1 of 23 slices shown (2 of 3)]
[im 1/23]
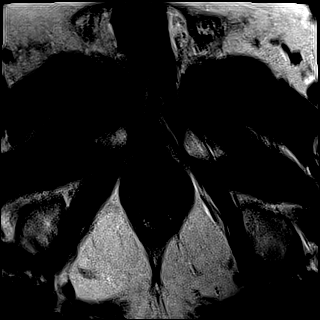

[Series 9: T2 · axial · 1.0mm · 1.04mm/px · z∈[-13,+66]mm · 2 of 80 slices shown (3 of 3)]
[im 1/80]
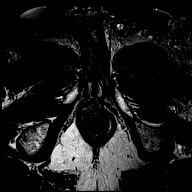
[im 80/80]
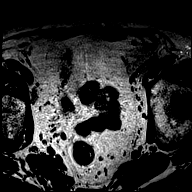

[Series 10: pre t1_twist_tra_dyn · axial · non-contrast · 3.5mm · 0.83mm/px · 1 of 20 slices shown]
[im 1/20]
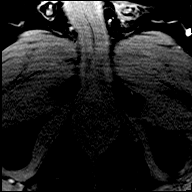

[Series 11: post t1_twist_tra_dyn-copy center · axial · non-contrast · 3.5mm · 0.83mm/px · z∈[-7,+60]mm · 17 of 600 slices shown]
[im 1/600]
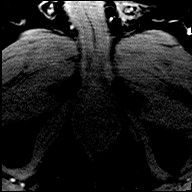
[im 38/600]
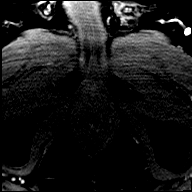
[im 75/600]
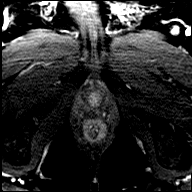
[im 113/600]
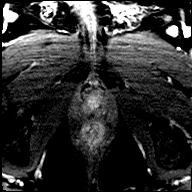
[im 150/600]
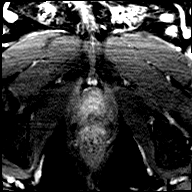
[im 188/600]
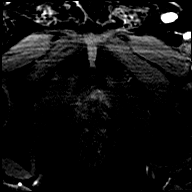
[im 225/600]
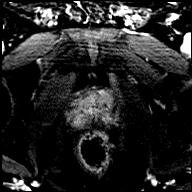
[im 263/600]
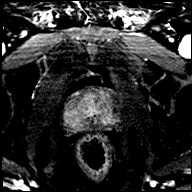
[im 300/600]
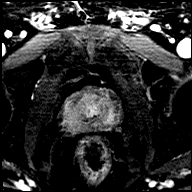
[im 337/600]
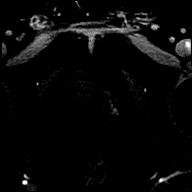
[im 375/600]
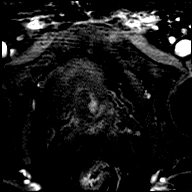
[im 412/600]
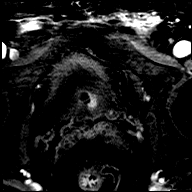
[im 450/600]
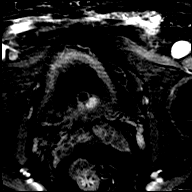
[im 487/600]
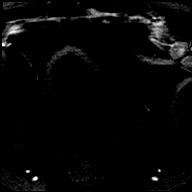
[im 525/600]
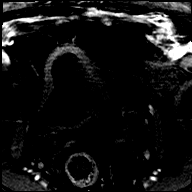
[im 562/600]
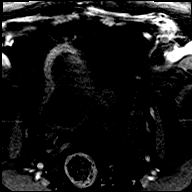
[im 600/600]
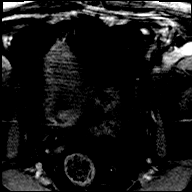

[Series 12: post t1_twist_tra_dyn-copy cent_sub · axial · 3.5mm · 0.83mm/px · z∈[-7,+60]mm · 17 of 579 slices shown]
[im 1/579]
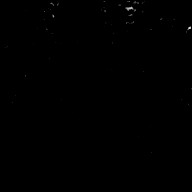
[im 37/579]
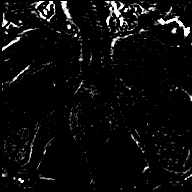
[im 73/579]
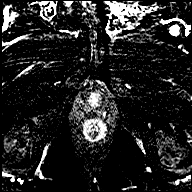
[im 109/579]
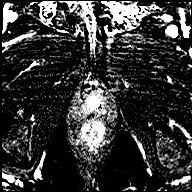
[im 145/579]
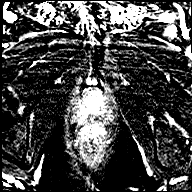
[im 181/579]
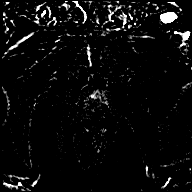
[im 217/579]
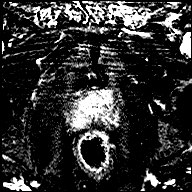
[im 253/579]
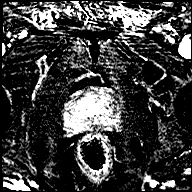
[im 290/579]
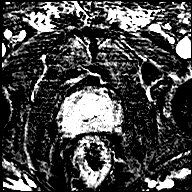
[im 326/579]
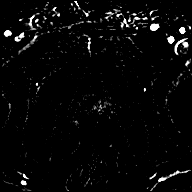
[im 362/579]
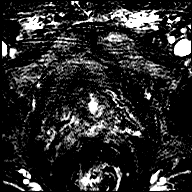
[im 398/579]
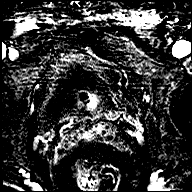
[im 434/579]
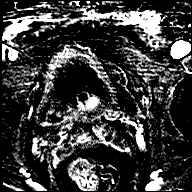
[im 470/579]
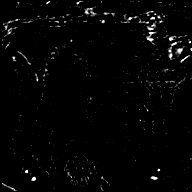
[im 506/579]
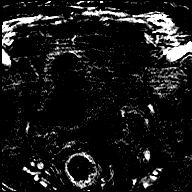
[im 542/579]
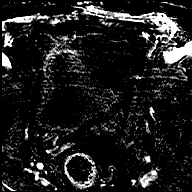
[im 579/579]
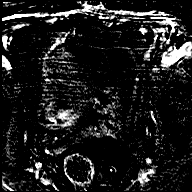

[Series 13: t1_vibe_dixon_tra_f · axial · 2.5mm · 0.91mm/px · z∈[-34,+163]mm · 2 of 80 slices shown]
[im 1/80]
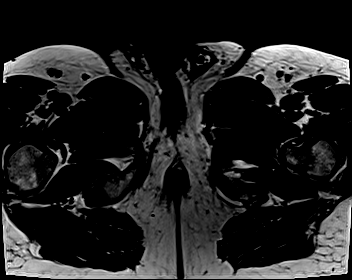
[im 80/80]
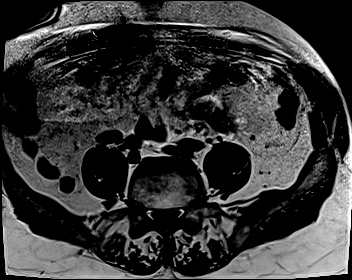

[Series 14: t1_vibe_dixon_tra_w · axial · 2.5mm · 0.91mm/px · z∈[-34,+163]mm · 2 of 80 slices shown]
[im 1/80]
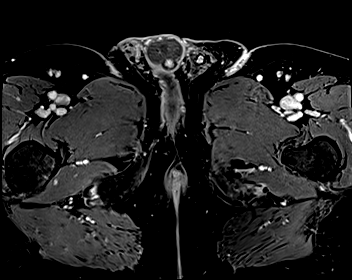
[im 80/80]
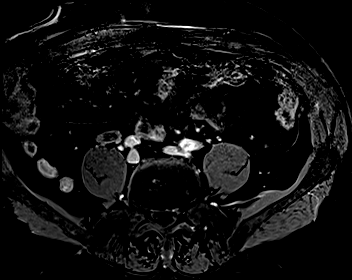

[48 of 48 positions shown; findings below may reference images not displayed]

FINDINGS: Prostate: There is post biopsy hemorrhage within the LEFT and RIGHT
peripheral zone as indicated by hyperintensity on T1 weighted
imaging (series 11 and series 4). There is one region of restricted
diffusion within the LEFT lateral apex measuring approximately
by 0.8 cm on image [DATE]. The peripheral zone is difficult to
evaluate on T2 weighted imaging due to hemorrhage within the
peripheral zone.

The transitional zone is relatively small with capsulated nodules.

The prostatic capsule is intact. Some vesicles appear normal. No
lymphadenopathy.

Volume: 4.9 x 4.7 x 3.7 cm (volume = 8 cm^3)

Transcapsular spread:  Absent

Seminal vesicle involvement: Absent

Neurovascular bundle involvement: Absent

Pelvic adenopathy: 4 mm LEFT external iliac lymph node on image
24/13)

Bone metastasis: None

Other findings: None
IMPRESSION: 1. Biopsy-proven multifocal prostate adenocarcinoma with the LEFT
and RIGHT (Gleason 3+3=6).
2. Evaluation the peripheral zone is limited by post biopsy
hemorrhage within the peripheral zone. One region of suspicion for
high-grade carcinoma within the LEFT lateral apex/midgland.
3. Prostatic capsule intact.  Seminal vesicles appear normal.
4. No lymphadenopathy.  4 mm LEFT external iliac lymph node.

## 2020-06-07 MED ORDER — GADOBENATE DIMEGLUMINE 529 MG/ML IV SOLN
20.0000 mL | Freq: Once | INTRAVENOUS | Status: AC | PRN
  Administered 2020-06-07: 20 mL via INTRAVENOUS

## 2020-07-14 ENCOUNTER — Encounter: Payer: Self-pay | Admitting: Radiation Oncology

## 2020-07-15 ENCOUNTER — Ambulatory Visit
Admission: RE | Admit: 2020-07-15 | Discharge: 2020-07-15 | Disposition: A | Discharge status: Home / Self Care | Payer: Medicaid Other | Source: Ambulatory Visit | Attending: Radiation Oncology | Admitting: Radiation Oncology

## 2020-07-15 ENCOUNTER — Other Ambulatory Visit: Payer: Self-pay

## 2020-07-15 ENCOUNTER — Encounter: Payer: Self-pay | Admitting: Radiation Oncology

## 2020-07-15 VITALS — Ht 74.0 in | Wt 202.0 lb

## 2020-07-15 DIAGNOSIS — C61 Malignant neoplasm of prostate: Secondary | ICD-10-CM | POA: Insufficient documentation

## 2020-07-15 HISTORY — DX: Malignant neoplasm of prostate: C61

## 2020-07-15 NOTE — Progress Notes (Signed)
Radiation Oncology         [phone] ________________________________  Initial Outpatient Consultation - Conducted via Telephone due to current COVID-19 concerns for limiting patient exposure  Name: Dan Salazar MRN: [id number]  Date: 07/15/2020  DOB: 04/30/1961  CN:[id number], Aurora Mask, FNP  Robley Fries, MD   REFERRING PHYSICIAN: Robley Fries, MD  DIAGNOSIS: 59 y.o. gentleman with Stage T1c adenocarcinoma of the prostate with Gleason score of 3+3, and PSA of 9.8.    ICD-10-CM   1. Malignant neoplasm of prostate ([place])  C61     HISTORY OF PRESENT ILLNESS: Dan Salazar is a 59 y.o. male with a diagnosis of prostate cancer. He has a history of elevated PSA dating back to at least 2018 when it was 6.9 in 03/2017 and 7.3 in 04/2017. More recently, he was noted to have an elevated PSA of 9.8 by his primary care physician, Dr. Owens Shark.  Accordingly, he was referred for evaluation in urology by Dr. Claudia Desanctis on 03/25/2020,  digital rectal examination was attempted at that time but the prostate was difficult to palpate secondary to body habitus.  The patient proceeded to transrectal ultrasound with 12 biopsies of the prostate on 05/05/2020.  The prostate volume measured 46 cc.  Out of 12 core biopsies, 4 were positive.  The maximum Gleason score was 3+3, and this was seen in the left apex lateral, left mid, left apex, and right mid.  He underwent prostate MRI on 06/07/2020 confirming biopsy-proven multifocal prostate cancer but evaluation of the peripheral zone was limited by post-biopsy hemorrhage.  There was one region of suspicion within the left lateral apex/mid gland that could potentially correlate with higher grade disease than indicated on biopsy but the prostatic capsule appeared intact and there was no evidence of seminal vesicle involvement or pelvic lymphadenopathy.  The patient reviewed the biopsy results with his urologist and he has kindly been referred today for discussion of  potential radiation treatment options.   PREVIOUS RADIATION THERAPY: No  PAST MEDICAL HISTORY:  Past Medical History:  Diagnosis Date  . C. difficile diarrhea    hx..resolved  . 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 Dyspnea   . Hypertension   . Prostate cancer ([place])   . RLS (restless legs syndrome)       PAST SURGICAL HISTORY: Past Surgical History:  Procedure Laterality Date  . FLEXIBLE BRONCHOSCOPY Left 11/03/2017   Procedure: FLEXIBLE BRONCHOSCOPY;  Surgeon: Melrose Nakayama, MD;  Location: [place];  Service: Thoracic;  Laterality: Left;  . HERNIA REPAIR     Lap diagnostic inguinal hernia repar 04/30/17 Dr. Greer Pickerel  . INGUINAL HERNIA REPAIR Left 04/30/2017   Procedure: OPEN REPAIR MASSIVE INCARCERATED LEFT INGUINAL HERNIA REPAIR WITH MESH;  Surgeon: Greer Pickerel, MD;  Location: [facility];  Service: General;  Laterality: Left;  . INGUINAL HERNIA REPAIR  05/23/2018  . INGUINAL HERNIA REPAIR Right 05/23/2018   Procedure: OPEN RIGHT INGUINAL HERNIA REPAIR ERAS PATHWAY;  Surgeon: Greer Pickerel, MD;  Location: [place];  Service: General;  Laterality: Right;  . INSERTION OF MESH Left 04/30/2017   Procedure: INSERTION OF MESH;  Surgeon: Greer Pickerel, MD;  Location: [facility];  Service: General;  Laterality: Left;  . INSERTION OF MESH Right 05/23/2018   Procedure: INSERTION OF MESH;  Surgeon: Greer Pickerel, MD;  Location: [place];  Service: General;  Laterality: Right;  . LAPAROSCOPY N/A 04/30/2017   Procedure: LAPAROSCOPY DIAGNOSTIC;  Surgeon: Greer Pickerel, MD;  Location: [facility];  Service: General;  Laterality: N/A;  . OPEN REDUCTION INTERNAL FIXATION (ORIF) DISTAL RADIAL FRACTURE Left 03/02/2013   Procedure: OPEN REDUCTION INTERNAL FIXATION (ORIF) LEFT DISTAL RADIAL FRACTURE;  Surgeon: Jolyn Nap, MD;  Location: [place];  Service: Orthopedics;  Laterality: Left;  Marland Kitchen VIDEO ASSISTED THORACOSCOPY (VATS)/EMPYEMA Left 11/03/2017   Procedure: VIDEO ASSISTED THORACOSCOPY (VATS)  FOR DRAINAGE OF PLEURAL EFFUSION;  Surgeon: Melrose Nakayama, MD;  Location: [facility];  Service: Thoracic;  Laterality: Left;    FAMILY HISTORY:  Family History  Problem Relation Age of Onset  . Lung cancer Father   . Breast cancer Neg Hx   . Prostate cancer Neg Hx   . Colon cancer Neg Hx   . Pancreatic cancer Neg Hx     SOCIAL HISTORY:  Social History   Socioeconomic History  . Marital status: Divorced    Spouse name: Not on file  . Number of children: 1  . Years of education: Not on file  . Highest education level: Not on file  Occupational History  . Occupation: disabled  Tobacco Use  . Smoking status: Never Smoker  . Smokeless tobacco: Never Used  Vaping Use  . Vaping Use: Never used  Substance and Sexual Activity  . Alcohol use: Yes    Comment: social  . Drug use: No  . Sexual activity: Not Currently  Other Topics Concern  . Not on file  Social History Narrative   One-story home   Social Determinants of Health   Financial Resource Strain: Not on file  Food Insecurity: Not on file  Transportation Needs: Not on file  Physical Activity: Not on file  Stress: Not on file  Social Connections: Not on file  Intimate Partner Violence: Not on file    ALLERGIES: Hctz [hydrochlorothiazide]  MEDICATIONS:  Current Outpatient Medications  Medication Sig Dispense Refill  . budesonide-formoterol (SYMBICORT) 160-4.5 MCG/ACT inhaler Inhale 2 puffs into the lungs 2 (two) times daily. 1 each 6  . donepezil (ARICEPT) 10 MG tablet Take 1/2 tablet daily for 2 weeks, then increase to 1 tablet daily 30 tablet 11  . ipratropium-albuterol (DUONEB) 0.5-2.5 (3) MG/3ML SOLN Take 3 mLs by nebulization every 6 (six) hours as needed. 360 mL 0  . lisinopril (ZESTRIL) 40 MG tablet TAKE 1/2 TABLET (20 MG) BY ORAL ROUTE ONCE DAILY FOR 30 DAYS    . tamsulosin (FLOMAX) 0.4 MG CAPS capsule Take 0.4 mg by mouth.     . thiamine (VITAMIN B-1) 100 MG tablet Take 1 tablet (100 mg total) by mouth  daily. 30 tablet 0  . traMADol (ULTRAM) 50 MG tablet Take 1 tablet (50 mg total) by mouth every 6 (six) hours as needed for moderate pain. 15 tablet 0   No current facility-administered medications for this encounter.    REVIEW OF SYSTEMS:  On review of systems, the patient reports that he is doing well overall. He denies any chest pain, shortness of breath, cough, fevers, chills, night sweats, unintended weight changes. He denies any bowel disturbances, and denies abdominal pain, nausea or vomiting. He denies any new musculoskeletal or joint aches or pains. His IPSS was 1, indicating minimal urinary symptoms. His SHIM was 4 due to no sexual activity, indicating he may or may not have erectile dysfunction. A complete review of systems is obtained and is otherwise negative.    PHYSICAL EXAM:  Wt Readings from Last 3 Encounters:  07/15/20 202 lb (91.6 kg)  03/08/20 228 lb 12.8  oz (103.8 kg)  02/25/20 (!) 229 lb (103.9 kg)   Temp Readings from Last 3 Encounters:  03/08/20 97.8 F (36.6 C) (Oral)  02/25/20 98.1 F (36.7 C) (Oral)  05/24/18 97.6 F (36.4 C) (Oral)   BP Readings from Last 3 Encounters:  03/08/20 122/68  02/25/20 (!) 130/86  08/12/18 119/83   Pulse Readings from Last 3 Encounters:  03/08/20 78  02/25/20 101  08/12/18 68   Pain Assessment Pain Score: 0-No pain/10  Physical exam not performed in light of telephone consult visit format.   KPS = 90  100 - Normal; no complaints; no evidence of disease. 90   - Able to carry on normal activity; minor signs or symptoms of disease. 80   - Normal activity with effort; some signs or symptoms of disease. 61   - Cares for self; unable to carry on normal activity or to do active work. 60   - Requires occasional assistance, but is able to care for most of his personal needs. 50   - Requires considerable assistance and frequent medical care. 16   - Disabled; requires special care and assistance. 57   - Severely disabled;  hospital admission is indicated although death not imminent. 110   - Very sick; hospital admission necessary; active supportive treatment necessary. 10   - Moribund; fatal processes progressing rapidly. 0     - Dead  Karnofsky DA, Abelmann Isabel, Craver LS and Burchenal [facility] [phone]) The use of the nitrogen mustards in the palliative treatment of carcinoma: with particular reference to bronchogenic carcinoma Cancer 1 634-56  LABORATORY DATA:  Lab Results  Component Value Date   WBC 6.5 05/15/2018   HGB 12.7 (L) 05/15/2018   HCT 40.7 05/15/2018   MCV 92.5 05/15/2018   PLT 185 05/15/2018   Lab Results  Component Value Date   NA 145 05/15/2018   K 3.0 (L) 05/15/2018   CL 111 05/15/2018   CO2 25 05/15/2018   Lab Results  Component Value Date   ALT 12 (L) 11/09/2017   AST 10 (L) 11/09/2017   ALKPHOS 116 11/09/2017   BILITOT 0.6 11/09/2017     RADIOGRAPHY: No results found.    IMPRESSION/PLAN: This visit was conducted via Telephone to spare the patient unnecessary potential exposure in the healthcare setting during the current COVID-19 pandemic. 1. 59 y.o. gentleman with Stage T1c adenocarcinoma of the prostate with Gleason Score of 3+3, and PSA of 9.8. We discussed the patient's workup and outlined the nature of prostate cancer in this setting. The patient's T stage, Gleason's score, and PSA put him into the low risk group. Accordingly, he is eligible for a variety of potential treatment options including active surveillance,  brachytherapy, 5.5 weeks of external radiation, or prostatectomy. We would agree with Dr. Keane Scrape recommendation to proceed with treatment based on the MRI findings that suggest possibly more aggressive disease than what was found on biopsy. The patient is not interested in prostatectomy.  Therefore, we discussed the available radiation techniques, and focused on the details and logistics of delivery. We discussed and outlined the risks, benefits, short and long-term  effects associated with radiotherapy and compared and contrasted these with prostatectomy. We discussed the role of SpaceOAR in reducing the rectal toxicity associated with radiotherapy. He appears to have a good understanding of his disease and our treatment recommendations which are of curative intent.  He was encouraged to ask questions that were answered to his stated satisfaction.  At the end of  the conversation, the patient is interested in moving forward with brachytherapy and use of SpaceOAR gel to reduce rectal toxicity from radiotherapy.  We will share our discussion with Dr. Claudia Desanctis and move forward with scheduling his CT [facility] planning appointment in the near future.  The patient will be contacted by Romie Jumper in our office who will be working closely with him to coordinate OR scheduling and pre and post procedure appointments.  We will contact the pharmaceutical rep to ensure that [place] is available at the time of procedure.  We enjoyed meeting him today and look forward to continuing to participate in his care.  Given current concerns for patient exposure during the COVID-19 pandemic, this encounter was conducted via telephone. The patient was notified in advance and was offered a MyChart meeting to allow for face to face communication but unfortunately reported that he did not have the appropriate resources/technology to support such a visit and instead preferred to proceed with telephone consult. The patient has given verbal consent for this type of encounter. The time spent during this encounter was 45 minutes. The attendants for this meeting include Tyler Pita MD, Ashlyn Bruning PA-C, [place], and patient, [employer] During the encounter, Tyler Pita MD, Ashlyn Bruning PA-C, and scribe, Wilburn Mylar were located at [facility].  Patient, Keona Sheffler was located at home.    Nicholos Johns, PA-C     Tyler Pita, MD  [facility] Direct Dial: [phone]  Fax: [phone] [email or web address]  Skype  [email or web address]  This document serves as a record of services personally performed by Tyler Pita, MD and Freeman Caldron, PA-C. It was created on their behalf by Wilburn Mylar, a trained medical scribe. The creation of this record is based on the scribe's personal observations and the provider's statements to them. This document has been checked and approved by the attending provider.

## 2020-07-15 NOTE — Progress Notes (Signed)
GU Location of Tumor / Histology: prostatic adenocarcinoma  If Prostate Cancer, Gleason Score is (3 + 3) and PSA is (9.8). Prostate volume: 46 g.  Dan Salazar presented with an elevated PSA.   04/2017 psa  7.3 03/2017  psa  6.9  Biopsies of prostate (if applicable) revealed:    Past/Anticipated interventions by urology, if any: prostate biopsy, referral to Dr. Tammi Klippel for consideration of radiation therapy  Past/Anticipated interventions by medical oncology, if any: no  Weight changes, if any: denies  Bowel/Bladder complaints, if any: IPSS 1. SHIM 4 no sexual activity. Denies dysuria, hematuria, urinary leakage or incontinence.   Nausea/Vomiting, if any: denies  Pain issues, if any:  denies  SAFETY ISSUES:  Prior radiation? denies  Pacemaker/ICD? denies  Possible current pregnancy? no, male patient  Is the patient on methotrexate? denies  Current Complaints / other details:  59 year old male. Single. One daughter. Lives in [place]. Disabled.

## 2020-07-27 ENCOUNTER — Telehealth: Payer: Self-pay | Admitting: *Deleted

## 2020-07-27 NOTE — Telephone Encounter (Signed)
CALLED Dan Salazar TO INFORM OF PRE-SEED APPTS. FOR THIS PATIENT ON 08-25-20, LVM FOR A RETURN CALL

## 2020-08-18 ENCOUNTER — Telehealth: Payer: Self-pay | Admitting: *Deleted

## 2020-08-18 NOTE — Telephone Encounter (Signed)
Called patient to inform of pre-seed appts. For 08-25-20 and his implant on 10-04-20, spoke with patient's friend, Daisy Blossom and he is aware of these appts.

## 2020-08-24 ENCOUNTER — Telehealth: Payer: Self-pay | Admitting: *Deleted

## 2020-08-24 NOTE — Telephone Encounter (Signed)
CALLED PATIENT TO REMIND OF PRE-SEEDS APPT. FOR 08-25-20, SPOKE WITH PATIENT'S FRIEND [name] ADAMS AND HE IS AWARE OF THESE APPTS.

## 2020-08-25 ENCOUNTER — Encounter (HOSPITAL_COMMUNITY)
Admission: RE | Admit: 2020-08-25 | Discharge: 2020-08-25 | Disposition: A | Discharge status: Home / Self Care | Payer: Medicaid Other | Source: Ambulatory Visit | Attending: Urology | Admitting: Urology

## 2020-08-25 ENCOUNTER — Other Ambulatory Visit: Payer: Self-pay

## 2020-08-25 ENCOUNTER — Ambulatory Visit
Admission: RE | Admit: 2020-08-25 | Discharge: 2020-08-25 | Disposition: A | Discharge status: Home / Self Care | Payer: Medicaid Other | Source: Ambulatory Visit | Attending: Radiation Oncology | Admitting: Radiation Oncology

## 2020-08-25 ENCOUNTER — Ambulatory Visit
Admission: RE | Admit: 2020-08-25 | Discharge: 2020-08-25 | Disposition: A | Discharge status: Home / Self Care | Payer: Medicaid Other | Source: Ambulatory Visit | Attending: Urology | Admitting: Urology

## 2020-08-25 ENCOUNTER — Ambulatory Visit (HOSPITAL_COMMUNITY)
Admission: RE | Admit: 2020-08-25 | Discharge: 2020-08-25 | Disposition: A | Discharge status: Home / Self Care | Payer: Medicaid Other | Source: Ambulatory Visit | Attending: Urology | Admitting: Urology

## 2020-08-25 DIAGNOSIS — Z01818 Encounter for other preprocedural examination: Secondary | ICD-10-CM | POA: Diagnosis not present

## 2020-08-25 DIAGNOSIS — C61 Malignant neoplasm of prostate: Secondary | ICD-10-CM | POA: Insufficient documentation

## 2020-08-25 IMAGING — CR DG CHEST 2V
2 series · 2 of 2 positions shown · non-contrast
Comparison: [DATE]

CLINICAL DATA: Preop testing

EXAM:
CHEST - 2 VIEW

[w chest pa]
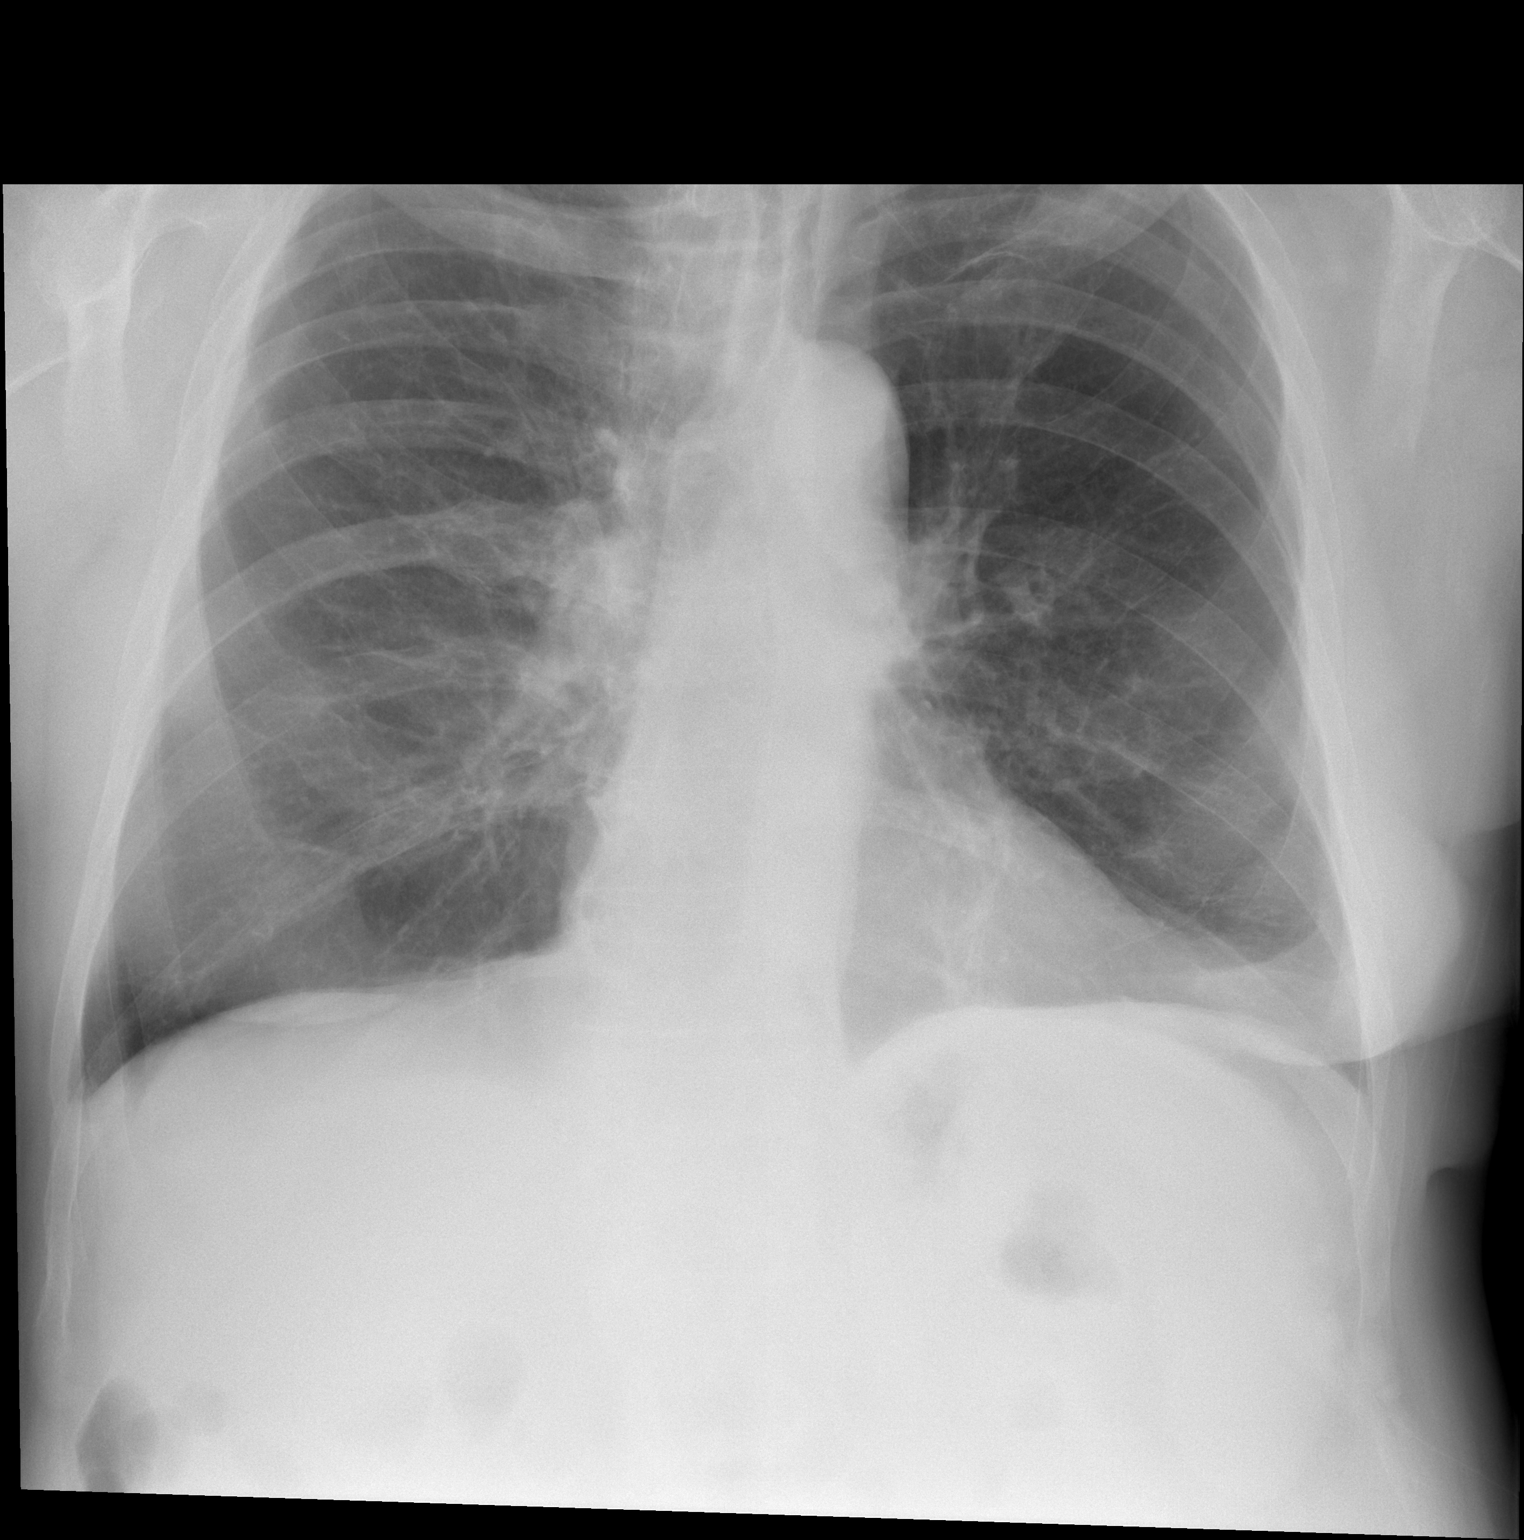

[w chest lat]
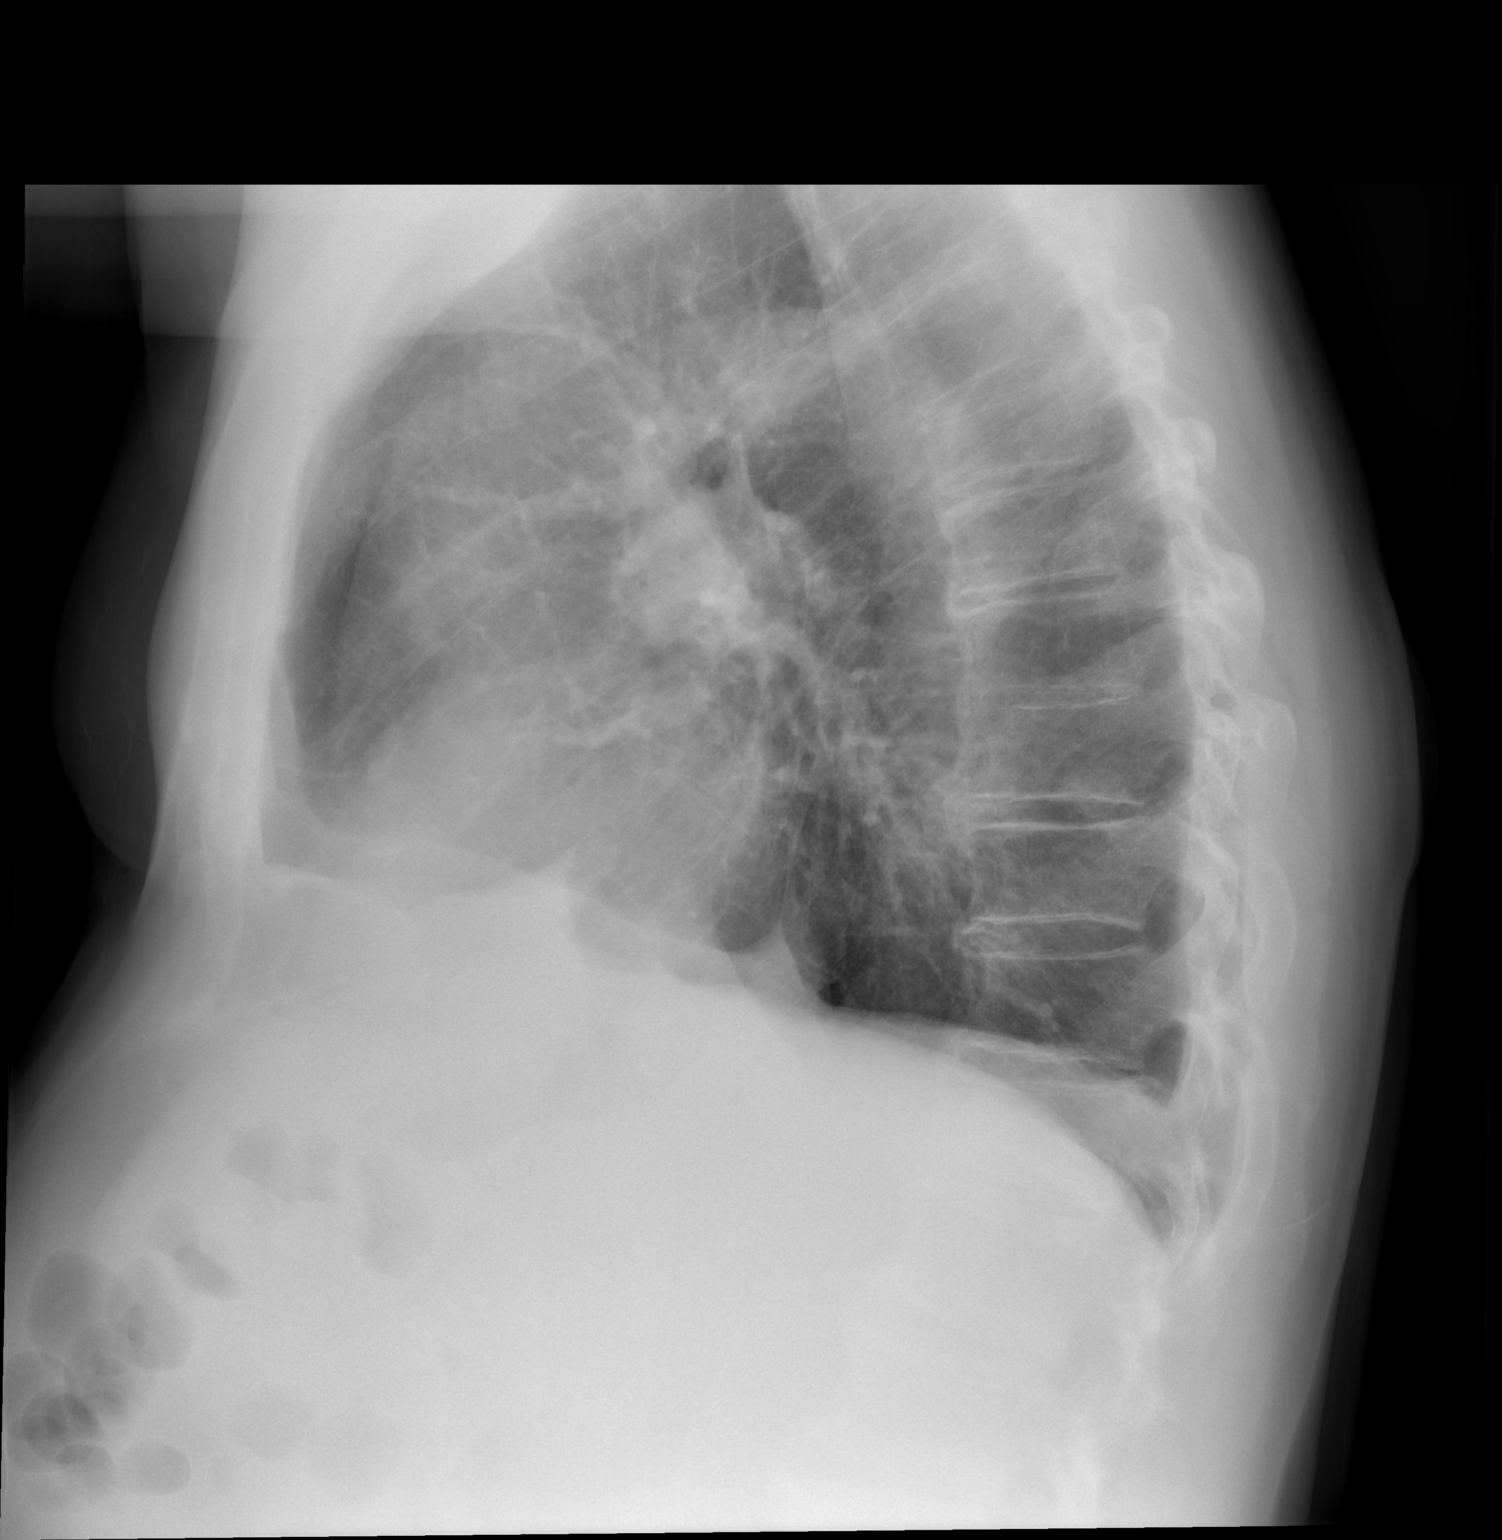

[2 of 2 positions shown; findings below may reference images not displayed]

FINDINGS: The heart size and mediastinal contours are within normal limits.
Both lungs are clear. Degenerative changes noted within the thoracic
spine.
IMPRESSION: No active cardiopulmonary disease.

## 2020-08-25 NOTE — Progress Notes (Signed)
  Radiation Oncology         [phone]) [phone] ________________________________  Name: Zanden Colver MRN: [id number]  Date: 08/25/2020  DOB: [birth year]  SIMULATION AND TREATMENT PLANNING NOTE PUBIC ARCH STUDY  TG:GYIRS, Aurora Mask, FNP  Robley Fries, MD  DIAGNOSIS: 60 y.o. gentleman with Stage T1c adenocarcinoma of the prostate with Gleason score of 3+3, and PSA of 9.8.  Oncology History  Malignant neoplasm of prostate ([place])  05/05/2020 Cancer Staging   Staging form: Prostate, AJCC 8th Edition - Clinical stage from 05/05/2020: Stage I (cT1c, cN0, cM0, PSA: 9.8, Grade Group: 1) - Signed by Freeman Caldron, PA-C on 07/15/2020   07/15/2020 Initial Diagnosis   Malignant neoplasm of prostate ([place])       ICD-10-CM   1. Malignant neoplasm of prostate ([place])  C61     COMPLEX SIMULATION:  The patient presented today for evaluation for possible prostate seed implant. He was brought to the radiation planning suite and placed supine on the CT couch. A 3-dimensional image study set was obtained in upload to the planning computer. There, on each axial slice, I contoured the prostate gland. Then, using three-dimensional radiation planning tools I reconstructed the prostate in view of the structures from the transperineal needle pathway to assess for possible pubic arch interference. In doing so, I did not appreciate any pubic arch interference. Also, the patient's prostate volume was estimated based on the drawn structure. The volume was 48 cc.  Given the pubic arch appearance and prostate volume, patient remains a good candidate to proceed with prostate seed implant. Today, he freely provided informed written consent to proceed.    PLAN: The patient will undergo prostate seed implant.   ________________________________  Sheral Apley. Tammi Klippel, M.D.

## 2020-10-18 ENCOUNTER — Telehealth: Payer: Self-pay | Admitting: *Deleted

## 2020-10-18 NOTE — Telephone Encounter (Signed)
CALLED PATIENT'S FRIEND [name] ADAMS TO REMIND OF LAB AND COVID TESTING FOR 10-21-20, SPOKE WITH [name] ADAMS AND HE IS AWARE OF THESE APPTS.

## 2020-10-18 NOTE — Telephone Encounter (Signed)
XXXX 

## 2020-10-20 ENCOUNTER — Encounter (HOSPITAL_BASED_OUTPATIENT_CLINIC_OR_DEPARTMENT_OTHER): Payer: Self-pay | Admitting: Urology

## 2020-10-20 ENCOUNTER — Other Ambulatory Visit: Payer: Self-pay

## 2020-10-20 NOTE — Progress Notes (Addendum)
Spoke w/ via phone for pre-op interview---poa Dan Salazar and pt due to early onset alzhimer's Lab needs dos----none               Lab results------has lab appt 10-21-2020 1100 am for cbc cmp pt ptt,  COVID test ------10-21-2020 1230 Arrive at -------1100 am 10-25-2020 NPO after MN NO Solid Food.  Water  from MN until---1000 am Med rec completed Medications to take morning of surgery -----symbicort inhaler prn/bring inhaler Diabetic medication -----n/a Patient instructed to bring photo id and insurance card day of surgery Patient aware to have Driver (ride ) / caregiver poa/friend Dan Salazar cell [phone]    for 24 hours after surgery  Patient Special Instructions -----fleets enema am of surgery Pre-Op special Istructions -----none Patient verbalized understanding of instructions that were given at this phone interview. Patient denies shortness of breath, chest pain, fever, cough at this phone interview.pt does own housework and errands abd drives, climbs steps without dofficulty  Patient with early altzhimer's , pt signs consents but needs poa Dan Salazar cell [phone] to come to pre op, poa  Dan to bring copy of poa dos.  ADDENDUM: emailed dos instructions to [facility] Sal[facility] copy of email instructions placed on pt chart and healthcare poa received from Dan Salazar via email and placed on patient chart.  ekg 08-25-2020 epic Chest xray 08-25-2020 epic Echo 11-11-2017 epic  televisit neurology 11-13-2018 dr Delice Lesch epic lov pumonary tammy parrett np 03-08-2020 epic f/u prn Pulmonary function test 03-09-2020 epic

## 2020-10-21 ENCOUNTER — Other Ambulatory Visit (HOSPITAL_COMMUNITY)
Admission: RE | Admit: 2020-10-21 | Discharge: 2020-10-21 | Disposition: A | Discharge status: Home / Self Care | Payer: Medicaid Other | Source: Ambulatory Visit | Attending: Urology | Admitting: Urology

## 2020-10-21 ENCOUNTER — Encounter (HOSPITAL_COMMUNITY)
Admission: RE | Admit: 2020-10-21 | Discharge: 2020-10-21 | Disposition: A | Discharge status: Home / Self Care | Payer: Medicaid Other | Source: Ambulatory Visit | Attending: Urology | Admitting: Urology

## 2020-10-21 ENCOUNTER — Other Ambulatory Visit: Payer: Self-pay | Admitting: Urology

## 2020-10-21 DIAGNOSIS — Z20822 Contact with and (suspected) exposure to covid-19: Secondary | ICD-10-CM | POA: Insufficient documentation

## 2020-10-21 DIAGNOSIS — Z01812 Encounter for preprocedural laboratory examination: Secondary | ICD-10-CM | POA: Insufficient documentation

## 2020-10-21 LAB — COMPREHENSIVE METABOLIC PANEL
ALT: 20 U/L (ref 0–44)
AST: 13 U/L — ABNORMAL LOW (ref 15–41)
Albumin: 4.1 g/dL (ref 3.5–5.0)
Alkaline Phosphatase: 58 U/L (ref 38–126)
Anion gap: 7 (ref 5–15)
BUN: 17 mg/dL (ref 8–23)
CO2: 24 mmol/L (ref 22–32)
Calcium: 9 mg/dL (ref 8.9–10.3)
Chloride: 106 mmol/L (ref 98–111)
Creatinine, Ser: 1.33 mg/dL — ABNORMAL HIGH (ref 0.61–1.24)
GFR, Estimated: >60 mL/min (ref >60)
Glucose, Bld: 84 mg/dL (ref 70–99)
Potassium: 4.2 mmol/L (ref 3.5–5.1)
Sodium: 137 mmol/L (ref 135–145)
Total Bilirubin: 0.9 mg/dL (ref 0.3–1.2)
Total Protein: 7.1 g/dL (ref 6.5–8.1)

## 2020-10-21 LAB — CBC
HCT: 49.3 % (ref 39.0–52.0)
Hemoglobin: 16.3 g/dL (ref 13.0–17.0)
MCH: 31 pg (ref 26.0–34.0)
MCHC: 33.1 g/dL (ref 30.0–36.0)
MCV: 93.9 fL (ref 80.0–100.0)
Platelets: 203 10*3/uL (ref 150–400)
RBC: 5.25 MIL/uL (ref 4.22–5.81)
RDW: 13.2 % (ref 11.5–15.5)
WBC: 10.8 10*3/uL — ABNORMAL HIGH (ref 4.0–10.5)
nRBC: 0 % (ref 0.0–0.2)

## 2020-10-21 LAB — PROTIME-INR
INR: 1 (ref 0.8–1.2)
Prothrombin Time: 12.5 seconds (ref 11.4–15.2)

## 2020-10-21 LAB — APTT: aPTT: 28 seconds (ref 24–36)

## 2020-10-21 LAB — SARS CORONAVIRUS 2 (TAT 6-24 HRS): SARS Coronavirus 2: NEGATIVE

## 2020-10-24 ENCOUNTER — Telehealth: Payer: Self-pay | Admitting: *Deleted

## 2020-10-24 NOTE — Telephone Encounter (Signed)
CALLED PATIENT'S FRIEND [name] ADAMS TO REMIND OF IMPLANT FOR 10-25-20, LVM FOR A RETURN CALL

## 2020-10-24 NOTE — H&P (Signed)
CC/HPI: cc: Prostate biopsy results   03/25/20: 60 year old man who is here today with his friend from church here for an elevated PSA. Most recent PSA in June 2021 was 9.8 and is noted to be 7.3 in 2018. No family history of prostate cancer. Patient denies any significant lower urinary tract symptoms.   05/05/20: Patient here for TRUS prostate biopsy   05/12/2020: 60 year old man with an elevated PSA of 9.8 who underwent a TRUS prostate biopsy on 05/05/2020 with pathology showing Gleason 3+3=6 in 4/12 cores. Prostate size 46 g.     ALLERGIES: NKDA    MEDICATIONS: None   GU PSH: Prostate Needle Biopsy - 05/05/2020     NON-GU PSH: Hernia Repair, Left - 2018, Left - 2018 Laparoscopy, Surgical; Implantation Of Mesh Or Other Prosthesis For Incisional Or Ventral Hernia Rep - 2018 Surgical Pathology, Gross And Microscopic Examination For Prostate Needle - 05/05/2020     GU PMH: Elevated PSA - 05/05/2020, Discussed possible reasons for elevated PSA including recent infection, trauma, inflammation, indwelling catheter, enlarged prostate and prostate cancer. I would like to schedule transrectal ultrasound guided prostate biopsy for patient. I discussed risks and benefits of prostate biospy including blood in the urine/stool/semen, pain, and risk of post biopsy sepsis. He was given instructions regarding the prostate biospy. He will take 1 tab of levaquin 750mg  PO the morning of his prostate biopsy. Patient understands and agrees with the above. , - 03/25/2020 Unil Inguinal Hernia W/obst, W/O gang, non-recurrent    NON-GU PMH: Atelectasis Chronic respiratory failure with hypercapnia Hypertension Hypokalemia Hypo-osmolality and hyponatremia Pleural effusion, not elsewhere classified Pneumonia, unspecified organism Tachycardia, unspecified    FAMILY HISTORY: 1 Daughter - Runs in Family   SOCIAL HISTORY: Marital Status: Single Ethnicity: Not Hispanic Or Latino; Race: White Current Smoking  Status: Patient has never smoked.   Tobacco Use Assessment Completed: Used Tobacco in last 30 days? Has never drank.  Drinks 2 caffeinated drinks per day.    REVIEW OF SYSTEMS:    GU Review Male:   Patient denies frequent urination, hard to postpone urination, burning/ pain with urination, get up at night to urinate, leakage of urine, stream starts and stops, trouble starting your stream, have to strain to urinate , erection problems, and penile pain.  Gastrointestinal (Upper):   Patient denies nausea, vomiting, and indigestion/ heartburn.  Gastrointestinal (Lower):   Patient denies diarrhea and constipation.  Constitutional:   Patient denies fever, night sweats, weight loss, and fatigue.  Skin:   Patient denies skin rash/ lesion and itching.  Eyes:   Patient denies blurred vision and double vision.  Ears/ Nose/ Throat:   Patient denies sore throat and sinus problems.  Hematologic/Lymphatic:   Patient denies swollen glands and easy bruising.  Cardiovascular:   Patient denies leg swelling and chest pains.  Respiratory:   Patient denies cough and shortness of breath.  Endocrine:   Patient denies excessive thirst.  Musculoskeletal:   Patient denies back pain and joint pain.  Neurological:   Patient denies headaches and dizziness.  Psychologic:   Patient denies depression and anxiety.   VITAL SIGNS:      05/12/2020 09:39 AM  Weight 215 lb / 97.52 kg  Height 75 in / 190.5 cm  BP 113/83 mmHg  Pulse 89 /min  Temperature 98.4 F / 36.8 C  BMI 26.9 kg/m   MULTI-SYSTEM PHYSICAL EXAMINATION:    Constitutional: Well-nourished. No physical deformities. Normally developed. Good grooming.  Neck: Neck symmetrical, not swollen. Normal tracheal  position.  Respiratory: No labored breathing, no use of accessory muscles.   Cardiovascular: Normal temperature  Skin: No paleness, no jaundice, no cyanosis. No lesion, no ulcer, no rash.  Neurologic / Psychiatric: Oriented to time, oriented to place,  oriented to person. No depression, no anxiety, no agitation.  Gastrointestinal: No rigidity, non obese abdomen.   Eyes: Normal conjunctivae. Normal eyelids.  Ears, Nose, Mouth, and Throat: Left ear no scars, no lesions, no masses. Right ear no scars, no lesions, no masses. Nose no scars, no lesions, no masses. Normal hearing. Normal lips.  Musculoskeletal: Normal gait and station of head and neck.     Complexity of Data:  Lab Test Review:   Path Report  Records Review:   POC Tool  Urine Test Review:   Urinalysis  Notes:                     05/15/2018: BUN 11, creatinine 1.21   PROCEDURES:          Urinalysis w/Scope Dipstick Dipstick Cont'd Micro  Color: Yellow Bilirubin: Neg mg/dL WBC/hpf: 0 - 5/hpf  Appearance: Clear Ketones: Neg mg/dL RBC/hpf: NS (Not Seen)  Specific Gravity: 1.015 Blood: Trace ery/uL Bacteria: NS (Not Seen)  pH: <=5.0 Protein: Neg mg/dL Cystals: NS (Not Seen)  Glucose: Neg mg/dL Urobilinogen: 0.2 mg/dL Casts: NS (Not Seen)    Nitrites: Neg Trichomonas: Not Present    Leukocyte Esterase: Neg leu/uL Mucous: Not Present      Epithelial Cells: 0 - 5/hpf      Yeast: NS (Not Seen)      Sperm: Not Present    ASSESSMENT:      ICD-10 Details  1 GU:   Prostate Cancer - C61 Undiagnosed New Problem - Reviewed and provided copy of biopsy pathology report (Gleason 3+3=6, Grade group 1, low risk prostate cancer). Counseled regarding diagnosis, gleason grade system, clinical staging system and patient's own diagnosis grade and stage counseled regarding prostate cancer treatment including active surveillance, radiation, and surgery. Counseled patient regarding options for curative treatment including surgery, open and robotic surgery counseled regarding hospitalization and recovery including indwelling catheter for 10 days and post operative restrictions such as lifting restriction less than 10-15 pounds for 4-6 weeks counseled regarding risks including but not limited to operative  risks such as heart attack and stroke and post treatment side effects such as incomplete cure of cancer, need for adjuvant therapy, urinary incontinence and erectile dysfunction requiring treatment or intervention Such interventions for incontinence and ED discussed. provided AUA foundation literature regarding prostate cancer and Discussed the MSK pretreatment nomograms. Patient with [99%] 15 yr CSS and [82%] 10 yr progression free survival after prostatectomy. Also discussed need for lifelong surveillance and options for secondary treatment should that be necessary. [email or web address] patient expressed understanding regarding all above answered all current questions

## 2020-10-25 ENCOUNTER — Ambulatory Visit (HOSPITAL_COMMUNITY): Payer: Medicaid Other

## 2020-10-25 ENCOUNTER — Ambulatory Visit (HOSPITAL_BASED_OUTPATIENT_CLINIC_OR_DEPARTMENT_OTHER)
Admission: RE | Admit: 2020-10-25 | Discharge: 2020-10-25 | Disposition: A | Discharge status: Home / Self Care | Payer: Medicaid Other | Source: Ambulatory Visit | Attending: Urology | Admitting: Urology

## 2020-10-25 ENCOUNTER — Encounter (HOSPITAL_BASED_OUTPATIENT_CLINIC_OR_DEPARTMENT_OTHER)
Admission: RE | Disposition: A | Discharge status: Home / Self Care | Payer: Self-pay | Source: Ambulatory Visit | Attending: Urology

## 2020-10-25 ENCOUNTER — Ambulatory Visit (HOSPITAL_BASED_OUTPATIENT_CLINIC_OR_DEPARTMENT_OTHER): Payer: Medicaid Other | Admitting: Anesthesiology

## 2020-10-25 ENCOUNTER — Other Ambulatory Visit: Payer: Self-pay

## 2020-10-25 ENCOUNTER — Encounter (HOSPITAL_BASED_OUTPATIENT_CLINIC_OR_DEPARTMENT_OTHER): Payer: Self-pay | Admitting: Urology

## 2020-10-25 DIAGNOSIS — C61 Malignant neoplasm of prostate: Secondary | ICD-10-CM | POA: Insufficient documentation

## 2020-10-25 HISTORY — PX: CYSTOSCOPY: SHX5120

## 2020-10-25 HISTORY — DX: Personal history of other diseases of the circulatory system: Z86.79

## 2020-10-25 HISTORY — PX: SPACE OAR INSTILLATION: SHX6769

## 2020-10-25 HISTORY — DX: Anxiety disorder, unspecified: F41.9

## 2020-10-25 HISTORY — PX: RADIOACTIVE SEED IMPLANT: SHX5150

## 2020-10-25 HISTORY — DX: Chronic obstructive pulmonary disease, unspecified: J44.9

## 2020-10-25 HISTORY — DX: Presence of dental prosthetic device (complete) (partial): Z97.2

## 2020-10-25 SURGERY — INSERTION, RADIATION SOURCE, PROSTATE
Anesthesia: General | Site: Prostate

## 2020-10-25 MED ORDER — SODIUM CHLORIDE (PF) 0.9 % IJ SOLN
INTRAMUSCULAR | Status: DC | PRN
  Administered 2020-10-25: 10 mL

## 2020-10-25 MED ORDER — FENTANYL CITRATE (PF) 100 MCG/2ML IJ SOLN
INTRAMUSCULAR | Status: DC | PRN
  Administered 2020-10-25: 50 ug via INTRAVENOUS
  Administered 2020-10-25 (×2): 25 ug via INTRAVENOUS

## 2020-10-25 MED ORDER — CEFAZOLIN SODIUM-DEXTROSE 2-4 GM/100ML-% IV SOLN
2.0000 g | Freq: Once | INTRAVENOUS | Status: AC
  Administered 2020-10-25: 2 g via INTRAVENOUS

## 2020-10-25 MED ORDER — FENTANYL CITRATE (PF) 100 MCG/2ML IJ SOLN: 25.0000 ug | INTRAMUSCULAR | Status: DC | PRN

## 2020-10-25 MED ORDER — NYSTATIN 100000 UNIT/GM EX POWD: 1.0000 "application " | Freq: Three times a day (TID) | CUTANEOUS | 0 refills | Status: DC

## 2020-10-25 MED ORDER — FENTANYL CITRATE (PF) 100 MCG/2ML IJ SOLN
INTRAMUSCULAR | Status: AC
  Filled 2020-10-25: qty 2

## 2020-10-25 MED ORDER — EPHEDRINE SULFATE-NACL 50-0.9 MG/10ML-% IV SOSY
PREFILLED_SYRINGE | INTRAVENOUS | Status: DC | PRN
  Administered 2020-10-25: 10 mg via INTRAVENOUS

## 2020-10-25 MED ORDER — PROPOFOL 10 MG/ML IV BOLUS
INTRAVENOUS | Status: AC
  Filled 2020-10-25: qty 20

## 2020-10-25 MED ORDER — CEFAZOLIN SODIUM-DEXTROSE 2-4 GM/100ML-% IV SOLN
INTRAVENOUS | Status: AC
  Filled 2020-10-25: qty 100

## 2020-10-25 MED ORDER — LIDOCAINE 2% (20 MG/ML) 5 ML SYRINGE
INTRAMUSCULAR | Status: DC | PRN
  Administered 2020-10-25: 60 mg via INTRAVENOUS

## 2020-10-25 MED ORDER — STERILE WATER FOR IRRIGATION IR SOLN
Status: DC | PRN
  Administered 2020-10-25: 3 mL

## 2020-10-25 MED ORDER — ONDANSETRON HCL 4 MG/2ML IJ SOLN
INTRAMUSCULAR | Status: DC | PRN
  Administered 2020-10-25: 4 mg via INTRAVENOUS

## 2020-10-25 MED ORDER — ONDANSETRON HCL 4 MG/2ML IJ SOLN
INTRAMUSCULAR | Status: AC
  Filled 2020-10-25: qty 2

## 2020-10-25 MED ORDER — ACETAMINOPHEN 500 MG PO TABS
ORAL_TABLET | ORAL | Status: AC
  Filled 2020-10-25: qty 2

## 2020-10-25 MED ORDER — ACETAMINOPHEN 500 MG PO TABS
1000.0000 mg | ORAL_TABLET | Freq: Once | ORAL | Status: AC
  Administered 2020-10-25: 1000 mg via ORAL

## 2020-10-25 MED ORDER — HYDROCODONE-ACETAMINOPHEN 5-325 MG PO TABS: 1.0000 | ORAL_TABLET | ORAL | 0 refills | Status: DC | PRN

## 2020-10-25 MED ORDER — DEXAMETHASONE SODIUM PHOSPHATE 10 MG/ML IJ SOLN
INTRAMUSCULAR | Status: DC | PRN
  Administered 2020-10-25: 5 mg via INTRAVENOUS

## 2020-10-25 MED ORDER — PROPOFOL 10 MG/ML IV BOLUS
INTRAVENOUS | Status: DC | PRN
  Administered 2020-10-25: 150 mg via INTRAVENOUS

## 2020-10-25 MED ORDER — LACTATED RINGERS IV SOLN
INTRAVENOUS | Status: DC
  Administered 2020-10-25: 1000 mL via INTRAVENOUS

## 2020-10-25 MED ORDER — IOHEXOL 300 MG/ML  SOLN
INTRAMUSCULAR | Status: DC | PRN
  Administered 2020-10-25: 7 mL

## 2020-10-25 MED ORDER — SODIUM CHLORIDE 0.9 % IR SOLN
Status: DC | PRN
  Administered 2020-10-25: 200 mL

## 2020-10-25 MED ORDER — ONDANSETRON HCL 4 MG/2ML IJ SOLN: 4.0000 mg | Freq: Once | INTRAMUSCULAR | Status: DC | PRN

## 2020-10-25 MED ORDER — FLEET ENEMA 7-19 GM/118ML RE ENEM: 1.0000 | ENEMA | Freq: Once | RECTAL | Status: DC

## 2020-10-25 MED ORDER — DEXAMETHASONE SODIUM PHOSPHATE 10 MG/ML IJ SOLN
INTRAMUSCULAR | Status: AC
  Filled 2020-10-25: qty 1

## 2020-10-25 SURGICAL SUPPLY — 38 items
BAG DRN RND TRDRP ANRFLXCHMBR (UROLOGICAL SUPPLIES) ×2
BAG URINE DRAIN 2000ML AR STRL (UROLOGICAL SUPPLIES) ×3 IMPLANT
BLADE CLIPPER SENSICLIP SURGIC (BLADE) ×3 IMPLANT
CATH FOLEY 2WAY SLVR  5CC 16FR (CATHETERS) ×1
CATH FOLEY 2WAY SLVR 5CC 16FR (CATHETERS) ×2 IMPLANT
CATH ROBINSON RED A/P 16FR (CATHETERS) IMPLANT
CATH ROBINSON RED A/P 20FR (CATHETERS) ×3 IMPLANT
CLOTH BEACON ORANGE TIMEOUT ST (SAFETY) ×3 IMPLANT
CNTNR URN SCR LID CUP LEK RST (MISCELLANEOUS) ×4 IMPLANT
CONT SPEC 4OZ STRL OR WHT (MISCELLANEOUS) ×6
COVER BACK TABLE 60X90IN (DRAPES) ×3 IMPLANT
COVER MAYO STAND STRL (DRAPES) ×3 IMPLANT
DRAPE C-ARM 35X43 STRL (DRAPES) IMPLANT
DRSG TEGADERM 2-3/8X2-3/4 SM (GAUZE/BANDAGES/DRESSINGS) IMPLANT
DRSG TEGADERM 4X4.75 (GAUZE/BANDAGES/DRESSINGS) ×3 IMPLANT
DRSG TEGADERM 8X12 (GAUZE/BANDAGES/DRESSINGS) ×9 IMPLANT
GAUZE SPONGE 4X4 12PLY STRL LF (GAUZE/BANDAGES/DRESSINGS) ×3 IMPLANT
GLOVE SURG ENC MOIS LTX SZ6.5 (GLOVE) ×9 IMPLANT
GLOVE SURG ENC MOIS LTX SZ7.5 (GLOVE) ×6 IMPLANT
GLOVE SURG ENC MOIS LTX SZ8 (GLOVE) ×6 IMPLANT
GLOVE SURG ORTHO LTX SZ8.5 (GLOVE) IMPLANT
GLOVE SURG POLYISO LF SZ6.5 (GLOVE) ×3 IMPLANT
GOWN STRL REUS W/TWL LRG LVL3 (GOWN DISPOSABLE) ×12 IMPLANT
GOWN STRL REUS W/TWL XL LVL3 (GOWN DISPOSABLE) ×3 IMPLANT
HOLDER FOLEY CATH W/STRAP (MISCELLANEOUS) IMPLANT
I-Seed AgX100 ×177 IMPLANT
IMPL SPACEOAR VUE SYSTEM (Spacer) ×2 IMPLANT
IMPLANT SPACEOAR VUE SYSTEM (Spacer) ×3 IMPLANT
IV NS 1000ML (IV SOLUTION) ×3
IV NS 1000ML BAXH (IV SOLUTION) ×2 IMPLANT
KIT TURNOVER CYSTO (KITS) ×3 IMPLANT
MARKER SKIN DUAL TIP RULER LAB (MISCELLANEOUS) ×3 IMPLANT
PACK CYSTO (CUSTOM PROCEDURE TRAY) ×3 IMPLANT
SUT BONE WAX W31G (SUTURE) IMPLANT
SYR 10ML LL (SYRINGE) ×6 IMPLANT
TOWEL OR 17X26 10 PK STRL BLUE (TOWEL DISPOSABLE) ×3 IMPLANT
UNDERPAD 30X36 HEAVY ABSORB (UNDERPADS AND DIAPERS) ×6 IMPLANT
WATER STERILE IRR 500ML POUR (IV SOLUTION) ×3 IMPLANT

## 2020-10-25 NOTE — Transfer of Care (Addendum)
Immediate Anesthesia Transfer of Care Note  Patient: Dan Salazar  Procedure(s) Performed: RADIOACTIVE SEED IMPLANT/BRACHYTHERAPY IMPLANT (N/A Prostate) SPACE OAR INSTILLATION (N/A Prostate) CYSTOSCOPY FLEXIBLE (N/A Bladder)  Patient Location: PACU  Anesthesia Type:General  Level of Consciousness: sedated  Airway & Oxygen Therapy: Patient Spontanous Breathing and Patient connected to face mask oxygen  Post-op Assessment: Report given to RN  Post vital signs: reviewed, needs to void.  Offered urinal   Last Vitals:  Vitals Value Taken Time  BP 147/106   Temp    Pulse 78 10/25/20 1501  Resp    SpO2 97 % 10/25/20 1501  Vitals shown include unvalidated device data.  Last Pain:  Vitals:   10/25/20 1135  TempSrc: Oral  PainSc: 0-No pain      Patients Stated Pain Goal: 6 (18/84/16 6063)  Complications: No complications documented.

## 2020-10-25 NOTE — Op Note (Signed)
PATIENT:  Dan Salazar  PRE-OPERATIVE DIAGNOSIS:  Adenocarcinoma of the prostate  POST-OPERATIVE DIAGNOSIS:  Same  PROCEDURE:  1. I-125 radioactive seed implantation 2. Cystoscopy  3. Placement of SpaceOAR 4. Fluoroscopy use with time less than 1 hour.  SURGEON:  Jacalyn Lefevre, MD  Radiation oncologist: Dr. Tyler Pita  ANESTHESIA:  General  EBL:  Minimal  DRAINS: None  INDICATION: Dan Salazar  Description of procedure: After informed consent the patient was brought to the major OR, placed on the table and administered general anesthesia. He was then moved to the modified lithotomy position with his perineum perpendicular to the floor. His perineum and genitalia were then sterilely prepped. An official timeout was then performed. A 16 French Foley catheter was then placed in the bladder and filled with dilute contrast, a rectal tube was placed in the rectum and the transrectal ultrasound probe was placed in the rectum and affixed to the stand. He was then sterilely draped.  Real time ultrasonography was used along with the seed planning software [employer] Prostate vs. 4.2.2.4. This was used to develop the seed plan including the number of needles as well as number of seeds required for complete and adequate coverage.  The needles were then preloaded with seeds and spacers according to the previously developed plan.  Real-time ultrasonography was then used along with the previously developed plan to implant a total of 59 seeds using 20 needles. This proceeded without difficulty or complication.   I then proceeded with placement of SpaceOAR by introducing a needle with the bevel angled inferiorly approximately 2 cm superior to the anus. This was angled downward and under direct ultrasound was placed within the space between the prostatic capsule and rectum. This was confirmed with a small amount of sterile saline injected and this was performed under direct ultrasound. I then  attached the SpaceOAR to the needle and injected this in the space between the prostate and rectum with good placement noted.  A Foley catheter was then removed as well as the transrectal ultrasound probe and rectal probe. Flexible cystoscopy was then performed using the 17 French flexible scope which revealed a normal urethra throughout its length down to the sphincter which appeared intact. The prostatic urethra revealed bilobar hypertrophy with median lobe and also small papillary bladder mass on trigone.   The bladder was then entered and fully and systematically inspected. The ureteral orifices were noted to be of normal configuration and position.  There were also no stones identified within the bladder. I noted no seeds or spacers on the floor of the bladder and retroflexion of the scope revealed no seeds protruding from the base of the prostate.  C-arm fluoroscopy was then used to evaluate the distribution of seeds placement and aid in determining confirmation of the number of seeds placed.  Real-time fluoroscopy was used with saved images revealing the location of the seeds placed both in AP and oblique views.  The cystoscope was then removed and the patient was awakened and taken to recovery room in stable and satisfactory condition. He tolerated procedure well and there were no intraoperative complications.  Follow up: patient will need follow up to discuss bladder bx/TURBT

## 2020-10-25 NOTE — Discharge Instructions (Signed)
DISCHARGE INSTRUCTIONS FOR PROSTATE SEED IMPLANTATION   Antibiotics You may be given a prescription for an antibiotic to take when you arrive home. If so, be sure to take every tablet in the bottle, even if you are feeling better before the prescription is finished. If you begin itching, notice a rash or start to swell on your trunk, arms, legs and/or throat, immediately stop taking the antibiotic and call your Urologist. Diet Resume your usual diet when you return home. To keep your bowels moving easily and softly, drink prune, apple and cranberry juice at room temperature. You may also take a stool softener, such as Colace, which is available without prescription at local pharmacies. Daily activities No driving or heavy lifting for at least two days after the implant. No bike riding, horseback riding or riding lawn mowers for the first month after the implant. Any strenuous physical activity should be approved by your doctor before you resume it. Sexual relations You may resume sexual relations two weeks after the procedure. A condom should be used for the first two weeks. Your semen may be dark brown or black; this is normal and is related bleeding that may have occurred during the implant. Postoperative swelling Expect swelling and bruising of the scrotum and perineum (the area between the scrotum and anus). Both the swelling and the bruising should resolve in l or 2 weeks. Ice packs and over- the-counter medications such as Tylenol, Advil or Aleve may lessen your discomfort. Postoperative urination Most men experience burning on urination and/or urinary frequency. If this becomes bothersome, contact your Urologist.  Medication can be prescribed to relieve these problems.  It is normal to have some blood in your urine for a few days after the implant. Special instructions related to the seeds It is unlikely that you will pass an Iodine-125 seed in your urine. The seeds are silver in color and  are about as large as a grain of rice. If you pass a seed, do not handle it with your fingers. Use a spoon to place it in an envelope or jar in place this in base occluded area such as the garage or basement for return to the radiation clinic at your convenience.  Contact your doctor for Temperature greater than 101 F Increasing pain Inability to urinate Follow-up  You should have follow up with your urologist and radiation oncologist about 3 weeks after the procedure. General information regarding Iodine seeds Iodine-125 is a low energy radioactive material. It is not deeply penetrating and loses energy at short distances. Your prostate will absorb the radiation. Objects that are touched or used by the patient do not become radioactive. Body wastes (urine and stool) or body fluids (saliva, tears, semen or blood) are not radioactive. The Nuclear Regulatory Commission (NRC) has determined that no radiation precautions are needed for patients undergoing Iodine-125 seed implantation. The NRC states that such patients do not present a risk to the people around them, including young children and pregnant women. However, in keeping with the general principle that radiation exposure should be kept as low reasonably possible, we suggest the following: Children and pets should not sit on the patient's lap for the first two (2) weeks after the implant. Pregnant (or possibly pregnant) women should avoid prolonged, close contact with the patient for the first two (2) weeks after the implant. A distance of three (3) feet is acceptable. At a distance of three (3) feet, there is no limit to the length of time anyone can   feet, there is no limit to the length of time anyone can be with the patient.  PROSTATE CANCER TREATMENT WITH RADIOACTIVE IODINE-125 SEED IMPLANT  This instruction sheet is intended to discuss implantation of Iodine-125 seeds as treatment for cancer of the prostate. It will explain in detail what you may expect from this  treatment and what precautions are necessary as a result of the treatment. Iodine-125 emits a relatively low energy radiation. The radioactive seeds are surgically implanted directly into the prostate gland. Most of the radiation is contained within the prostate gland. A very small amount is present outside the body.The precautions that we ask you to take are to ensure that those around you are protected from unnecessary radiation. The principles of radiation safety that you need to understand are:  DISTANCE: The further a person is from the radioactive implant the less radiation they will be receiving. The amount of radiation received falls off quite rapidly with distance. More specific guidelines are given in the table on the last page.  TIME: The amount of radiation a person is exposed to is directly proportional to the amount of time that is spent in close proximity to the radioactive implant. Very little radiation will be received during short periods. See the table on the last page for more specific guideline.  CHILDREN UNDER AGE 24 Children should not be allowed to sit on your lap or otherwise be in very close contact for more than a few minutes for the first 6-8 weeks following the implant. You may affectionately greet (hug/kiss) a child for a short period of time, but remember, the longer you are in close proximity with that child the more radiation they are being exposed to. At a distance of 6 feet there is no limit to the length of time you may spend together. See specific guidelines on the last page.  PREGNANT OR POSSIBLY PREGNANT WOMEN Pregnant women should avoid prolonged close physical contact with you for the first 6-8 weeks after implant. At a distance of 6 feet there is no limit to the length of time you may spend together. Pregnant women or possibly pregnant women can safely be in close contact with you for a limited period of time. See the last page for guidelines.  FAMILY  RELATIONS You may sleep in the same bed as your partner (provided she is not pregnant or under the age of 84). Sexual intercourse, using a condom, may be resumed 2 weeks after the implant. Your semen may be discolored, dark brown or black. This is normal and is the result of bleeding that may have occurred during the implant. After 3-4 weeks it will not be necessary to use a condom.  DAILY ACTIVITIES You may resume normal activities in a few days (example: work, shopping, church) without the risk of harmful radiation exposure to those around you provided you keep in mind the time and distance precautions. Objects that you touch or item that you use do not become radioactive. Linens, clothing, tableware, and dishes may be used by other persons without special precautions. Your bodily wastes (urine and stool) are not radioactive.  SPECIAL PRECAUTIONS It is possible to lose implanted Iodine-125 seed(s) through urination. Although it is possible to pass seeds indefinitely, it is most likely to occur immediately after catheter removal. To prevent this from happening the catheter that was in place during the implant procedure is removed immediately after the implant and a cystoscopy procedure is performed. The process of removing the catheter  and the cystoscopy procedure should dislodge and remove any seeds that are not firmly imbedded in the prostate tissue. However, you should watch for seeds if/when you remove your catheter at home. The seeds are silver colored and the size of a grain of rice. In the unlikely event that a seed is seen after urination, simply flush the seed down the toilet. The seed should not be handled with your fingers, not even with a glove or napkin. A spoon or tweezers can be used to pick up a seed. The Radiation Oncology department is open Monday - Friday from 8:00 am to 5:30 pm with a Radiation Oncologist on call at all times. He or she may be reached by calling [phone]. If you are  to be hospitalized or if death should occur, your family should notify the Runner, broadcasting/film/video.  SIDE EFFECTS There are very few side effects associate with the implant procedure. Minor burning with urination, weak stream, hesitancy, intermittency, frequency, mild pain or feeling unable to pass your urine freely are common and usually stop in one to four months. If these symptoms are extremely uncomfortable, contact your physician.   Post Anesthesia Home Care Instructions  Activity: Get plenty of rest for the remainder of the day. A responsible individual must stay with you for 24 hours following the procedure.  For the next 24 hours, DO NOT: -Drive a car -Paediatric nurse -Drink alcoholic beverages -Take any medication unless instructed by your physician -Make any legal decisions or sign important papers.  Meals: Start with liquid foods such as gelatin or soup. Progress to regular foods as tolerated. Avoid greasy, spicy, heavy foods. If nausea and/or vomiting occur, drink only clear liquids until the nausea and/or vomiting subsides. Call your physician if vomiting continues.  Special Instructions/Symptoms: Your throat may feel dry or sore from the anesthesia or the breathing tube placed in your throat during surgery. If this causes discomfort, gargle with warm salt water. The discomfort should disappear within 24 hours.   RADIATION SAFETY GUIDELINES PROSTATE CANCER TREATMENT WITH RADIOACTIVE IODINE-125 SEED IMPLANT  The following guidelines will limit exposure to less than naturally occurring background radiation.  PERSONS AGE 105-45 (if able to become pregnant)  FOR 8 WEEKS FOLLOWING IMPLANT  At a distance of 1 foot: limit time to less than 2 hours/week At a distance of 3 feet: limit time to 20 hours/week At a distance of 6 feet: no restrictions  AFTER 8 WEEKS No restrictions  CHILDREN UNDER AGE 105, PREGNANT WOMEN OR POSSIBLY PREGNANT WOMEN  FOR 8 WEEKS FOLLOWING  IMPLANT At a distance of 1 foot: limit time to 10 minutes/week At a distance of 3 feet: limit time to 2 hours/week At a distance of 6 feet: no restrictions  AFTER 8 WEEKS No restrictions  PERSONS OVER THE AGE OF 45 AND DO NOT EXPECT TO HAVE ANY MORE CHILDREN No restrictions  Updated by [facility] in January 2020

## 2020-10-25 NOTE — Progress Notes (Signed)
  Radiation Oncology         [phone]) [phone] ________________________________  Name: Dan Salazar MRN: [id number]  Date: 10/25/2020  DOB: [birth year]       Prostate Seed Implant  ER:XVQMG, Aurora Mask, FNP  No ref. provider found  DIAGNOSIS:  60 y.o. gentleman with Stage T1c adenocarcinoma of the prostate with Gleason score of 3+3, and PSA of 9.8.  Oncology History  Malignant neoplasm of prostate ([place])  05/05/2020 Cancer Staging   Staging form: Prostate, AJCC 8th Edition - Clinical stage from 05/05/2020: Stage I (cT1c, cN0, cM0, PSA: 9.8, Grade Group: 1) - Signed by Freeman Caldron, PA-C on 07/15/2020   07/15/2020 Initial Diagnosis   Malignant neoplasm of prostate ([facility])     No diagnosis found.  PROCEDURE: Insertion of radioactive I-125 seeds into the prostate gland.  RADIATION DOSE: 145 Gy, definitive therapy.  TECHNIQUE: Dan Salazar was brought to the operating room with the urologist. He was placed in the dorsolithotomy position. He was catheterized and a rectal tube was inserted. The perineum was shaved, prepped and draped. The ultrasound probe was then introduced into the rectum to see the prostate gland.  TREATMENT DEVICE: A needle grid was attached to the ultrasound probe stand and anchor needles were placed.  3D PLANNING: The prostate was imaged in 3D using a sagittal sweep of the prostate probe. These images were transferred to the planning computer. There, the prostate, urethra and rectum were defined on each axial reconstructed image. Then, the software created an optimized 3D plan and a few seed positions were adjusted. The quality of the plan was reviewed using [employer] [facility] information for the target and the following two organs at risk:  Urethra and Rectum.  Then the accepted plan was printed and handed off to the radiation therapist.  Under my supervision, the custom loading of the seeds and spacers was carried out and loaded into sealed vicryl sleeves.  These pre-loaded needles  were then placed into the needle holder.Marland Kitchen  PROSTATE VOLUME STUDY:  Using transrectal ultrasound the volume of the prostate was verified to be 36.6 cc.  SPECIAL TREATMENT PROCEDURE/SUPERVISION AND HANDLING: The pre-loaded needles were then delivered under sagittal guidance. A total of 20 needles were used to deposit 59 seeds in the prostate gland. The individual seed activity was 0.543 mCi.  SpaceOAR:  Yes  COMPLEX SIMULATION: At the end of the procedure, an anterior radiograph of the pelvis was obtained to document seed positioning and count. Cystoscopy was performed to check the urethra and bladder.  MICRODOSIMETRY: At the end of the procedure, the patient was emitting 0.067 mR/hr at 1 meter. Accordingly, he was considered safe for hospital discharge.  PLAN: The patient will return to the radiation oncology clinic for post implant CT dosimetry in three weeks.   ________________________________  Sheral Apley Tammi Klippel, M.D.

## 2020-10-25 NOTE — Interval H&P Note (Signed)
History and Physical Interval Note:  10/25/2020 12:59 PM  Lori Happel  has presented today for surgery, with the diagnosis of PROSTATE CANCER.  The various methods of treatment have been discussed with the patient and family. After consideration of risks, benefits and other options for treatment, the patient has consented to  Procedure(s) with comments: RADIOACTIVE SEED IMPLANT/BRACHYTHERAPY IMPLANT (N/A) - 90 MINS SPACE OAR INSTILLATION (N/A) as a surgical intervention.  The patient's history has been reviewed, patient examined, no change in status, stable for surgery.  I have reviewed the patient's chart and labs.  Questions were answered to the patient's satisfaction.     Icker Swigert D Khalil Belote

## 2020-10-25 NOTE — Anesthesia Procedure Notes (Addendum)
Procedure Name: LMA Insertion Date/Time: 10/25/2020 1:16 PM Performed by: Suan Halter, CRNA Pre-anesthesia Checklist: Patient identified, Emergency Drugs available, Suction available and Patient being monitored Patient Re-evaluated:Patient Re-evaluated prior to induction Oxygen Delivery Method: Circle system utilized Preoxygenation: Pre-oxygenation with 100% oxygen Induction Type: IV induction Ventilation: Mask ventilation without difficulty LMA: LMA inserted LMA Size: 5.0 Number of attempts: 1 Airway Equipment and Method: Bite block Placement Confirmation: positive ETCO2 Tube secured with: Tape Dental Injury: Teeth and Oropharynx as per pre-operative assessment

## 2020-10-25 NOTE — Anesthesia Preprocedure Evaluation (Addendum)
Anesthesia Evaluation  Patient identified by MRN, date of birth, ID band Patient awake    Reviewed: Allergy & Precautions, NPO status , Patient's Chart, lab work & pertinent test results  Airway Mallampati: II  TM Distance: >3 FB Neck ROM: Full    Dental  (+) Edentulous Upper, Partial Lower,    Pulmonary shortness of breath and with exertion, COPD (does not use inhalers),    Pulmonary exam normal breath sounds clear to auscultation       Cardiovascular hypertension (hx HTN, has been off meds since 2020), Normal cardiovascular exam Rhythm:Regular Rate:Normal  Echo 2019: nml    Neuro/Psych PSYCHIATRIC DISORDERS Anxiety Dementia  Neuromuscular disease (RLS)    GI/Hepatic negative GI ROS, (+)     substance abuse  marijuana use,   Endo/Other  negative endocrine ROS  Renal/GU Renal InsufficiencyRenal disease (cr 1.33)   Prostate ca     Musculoskeletal negative musculoskeletal ROS (+)   Abdominal   Peds  Hematology negative hematology ROS (+) hct 49.3   Anesthesia Other Findings   Reproductive/Obstetrics negative OB ROS                            Anesthesia Physical Anesthesia Plan  ASA: II  Anesthesia Plan: General   Post-op Pain Management:    Induction: Intravenous  PONV Risk Score and Plan: 3 and Ondansetron, Dexamethasone, Midazolam and Treatment may vary due to age or medical condition  Airway Management Planned: LMA  Additional Equipment: None  Intra-op Plan:   Post-operative Plan: Extubation in OR  Informed Consent: I have reviewed the patients History and Physical, chart, labs and discussed the procedure including the risks, benefits and alternatives for the proposed anesthesia with the patient or authorized representative who has indicated his/her understanding and acceptance.     Dental advisory given  Plan Discussed with: CRNA  Anesthesia Plan Comments:         Anesthesia Quick Evaluation

## 2020-10-25 NOTE — Anesthesia Postprocedure Evaluation (Signed)
Anesthesia Post Note  Patient: Dan Salazar  Procedure(s) Performed: RADIOACTIVE SEED IMPLANT/BRACHYTHERAPY IMPLANT (N/A Prostate) SPACE OAR INSTILLATION (N/A Prostate) CYSTOSCOPY FLEXIBLE (N/A Bladder)     Patient location during evaluation: PACU Anesthesia Type: General Level of consciousness: awake and alert, oriented and patient cooperative Pain management: pain level controlled Vital Signs Assessment: post-procedure vital signs reviewed and stable Respiratory status: spontaneous breathing, nonlabored ventilation and respiratory function stable Cardiovascular status: blood pressure returned to baseline and stable Postop Assessment: no apparent nausea or vomiting Anesthetic complications: no   No complications documented.  Last Vitals:  Vitals:   10/25/20 1502 10/25/20 1515  BP: (!) 147/106 (!) 154/95  Pulse: 65 60  Resp: 15 17  Temp:    SpO2: 97% 94%    Last Pain:  Vitals:   10/25/20 1515  TempSrc:   PainSc: 0-No pain                 Pervis Hocking

## 2020-10-26 ENCOUNTER — Encounter (HOSPITAL_BASED_OUTPATIENT_CLINIC_OR_DEPARTMENT_OTHER): Payer: Self-pay | Admitting: Urology

## 2020-11-02 ENCOUNTER — Encounter (HOSPITAL_BASED_OUTPATIENT_CLINIC_OR_DEPARTMENT_OTHER): Payer: Self-pay | Admitting: Urology

## 2020-11-02 NOTE — Addendum Note (Signed)
Addendum  created 11/02/20 0904 by Pervis Hocking, DO   Intraprocedure Event edited, Intraprocedure Staff edited

## 2020-11-22 ENCOUNTER — Telehealth: Payer: Self-pay | Admitting: *Deleted

## 2020-11-22 NOTE — Telephone Encounter (Signed)
CALLED PATIENT'S FRIEND [name] ADAMS TO REMIND OF POST SEED APPTS. FOR 11-23-20, LVM FOR A RETURN CALL

## 2020-11-23 ENCOUNTER — Ambulatory Visit
Admission: RE | Admit: 2020-11-23 | Discharge: 2020-11-23 | Disposition: A | Discharge status: Home / Self Care | Payer: Medicaid Other | Source: Ambulatory Visit | Attending: Radiation Oncology | Admitting: Radiation Oncology

## 2020-11-23 ENCOUNTER — Ambulatory Visit
Admission: RE | Admit: 2020-11-23 | Discharge: 2020-11-23 | Disposition: A | Discharge status: Home / Self Care | Payer: Medicaid Other | Source: Ambulatory Visit | Attending: Urology | Admitting: Urology

## 2020-11-23 ENCOUNTER — Ambulatory Visit: Payer: Medicaid Other | Admitting: Radiation Oncology

## 2020-11-23 ENCOUNTER — Other Ambulatory Visit: Payer: Self-pay

## 2020-11-23 ENCOUNTER — Encounter: Payer: Self-pay | Admitting: Urology

## 2020-11-23 VITALS — BP 138/98 | HR 80 | Temp 97.9°F | Resp 20 | Ht 74.0 in | Wt 222.0 lb

## 2020-11-23 DIAGNOSIS — R3912 Poor urinary stream: Secondary | ICD-10-CM | POA: Insufficient documentation

## 2020-11-23 DIAGNOSIS — Z79899 Other long term (current) drug therapy: Secondary | ICD-10-CM | POA: Insufficient documentation

## 2020-11-23 DIAGNOSIS — C61 Malignant neoplasm of prostate: Secondary | ICD-10-CM | POA: Insufficient documentation

## 2020-11-23 DIAGNOSIS — R351 Nocturia: Secondary | ICD-10-CM | POA: Insufficient documentation

## 2020-11-23 DIAGNOSIS — R339 Retention of urine, unspecified: Secondary | ICD-10-CM | POA: Insufficient documentation

## 2020-11-23 DIAGNOSIS — Z923 Personal history of irradiation: Secondary | ICD-10-CM | POA: Insufficient documentation

## 2020-11-23 NOTE — Progress Notes (Signed)
  Radiation Oncology         (336) [phone] ________________________________  Name: Dan Salazar MRN: [id number]  Date: 11/23/2020  DOB: 08/28/1960  COMPLEX SIMULATION NOTE  NARRATIVE:  The patient was brought to the [facility] today following prostate seed implantation approximately one month ago.  Identity was confirmed.  All relevant records and images related to the planned course of therapy were reviewed.  Then, the patient was set-up supine.  CT images were obtained.  The CT images were loaded into the planning software.  Then the prostate and rectum were contoured.  Treatment planning then occurred.  The implanted iodine 125 seeds were identified by the physics staff for projection of radiation distribution  I have requested : 3D Simulation  I have requested a DVH of the following structures: Prostate and rectum.    ________________________________  Sheral Apley Tammi Klippel, M.D.

## 2020-11-23 NOTE — Progress Notes (Addendum)
Patient denies having pain. Patient denies having dysuria or hematuria. Patient states regular bowel movements. Patient states urine stream is moderate and steady. Patient denies leakage. Patient denies fever. Patient denies fatigue. Patient states urgency and has difficulty holding his urine. Patient states having to push to start his urine flow. Patient states nocturia 2 times per night. Patient states that he does not have an appointment with the urologist.

## 2020-11-23 NOTE — Progress Notes (Signed)
Radiation Oncology         [phone] ________________________________  Name: Dan Salazar MRN: [id number]  Date: 11/23/2020  DOB: October 22, 1960  Post-Seed Follow-Up Visit Note  CC: Mardi Mainland, FNP  Robley Fries, MD  Diagnosis:   60 y.o. gentleman with Stage T1c adenocarcinoma of the prostate with Gleason score of 3+3, and PSA of 9.8.    ICD-10-CM   1. Malignant neoplasm of prostate ([facility])  C61     Interval Since Last Radiation:  4 weeks 10/25/20:  Insertion of radioactive I-125 seeds into the prostate gland; 145 Gy, definitive therapy with placement of SpaceOAR gel.  Narrative:  The patient returns today for routine follow-up.  He is complaining of increased urinary frequency and urinary hesitation symptoms. He filled out a questionnaire regarding urinary function today providing and overall IPSS score of 21, characterizing his symptoms as severe with nocturia x2, weak stream, hesitancy, urgency, frequency, intermittency and feelings of incomplete bladder emptying.  He reports that his LUTS are gradually improving and manageable. His pre-implant score was 1. He specifically denies pain, dysuria, gross hematuria or incontinence. He denies abdominal pain or any bowel symptoms. He has not noticed any significant impact on his energy level and continues with a healthy appetite, maintaining his weight.  Overall, he is quite pleased with his progress to date.  ALLERGIES:  is allergic to hctz [hydrochlorothiazide].  Meds: Current Outpatient Medications  Medication Sig Dispense Refill  . budesonide-formoterol (SYMBICORT) 160-4.5 MCG/ACT inhaler Inhale 2 puffs into the lungs 2 (two) times daily. (Patient not taking: Reported on 11/23/2020) 1 each 6  . HYDROcodone-acetaminophen (NORCO/VICODIN) 5-325 MG tablet Take 1 tablet by mouth every 4 (four) hours as needed for moderate pain. (Patient not taking: Reported on 11/23/2020) 10 tablet 0  . nystatin (MYCOSTATIN/NYSTOP) powder Apply 1  application topically 3 (three) times daily. (Patient not taking: Reported on 11/23/2020) 15 g 0   No current facility-administered medications for this encounter.    Physical Findings: In general this is a well appearing Caucasian male in no acute distress. He's alert and oriented x4 and appropriate throughout the examination. Cardiopulmonary assessment is negative for acute distress and he exhibits normal effort.   Lab Findings: Lab Results  Component Value Date   WBC 10.8 (H) 10/21/2020   HGB 16.3 10/21/2020   HCT 49.3 10/21/2020   MCV 93.9 10/21/2020   PLT 203 10/21/2020    Radiographic Findings:  Patient underwent CT imaging in our clinic for post implant dosimetry. The CT will be reviewed by Dr. Tammi Klippel to confirm there is an adequate distribution of radioactive seeds throughout the prostate gland and ensure that there are no seeds in or near the rectum.  We suspect the final radiation plan and dosimetry will show appropriate coverage of the prostate gland. He understands that we will call and inform him of any unexpected findings on further review of his imaging and dosimetry.  Impression/Plan: 60 y.o. gentleman with Stage T1c adenocarcinoma of the prostate with Gleason score of 3+3, and PSA of 9.8. The patient is recovering from the effects of radiation. His urinary symptoms should gradually improve over the next 4-6 months. We talked about this today. He is encouraged by his improvement already and is otherwise pleased with his outcome. We also talked about long-term follow-up for prostate cancer following seed implant. He understands that ongoing PSA determinations and digital rectal exams will help perform surveillance to rule out disease recurrence. He does not currently have a follow  up appointment scheduled with Dr. Claudia Desanctis but knows to call to schedule this if he has not heard from the staff at [facility] urology in the next week. He understands what to expect with his PSA measures.  Patient was also educated today about some of the long-term effects from radiation including a small risk for rectal bleeding and possibly erectile dysfunction. We talked about some of the general management approaches to these potential complications. However, I did encourage the patient to contact our office or return at any point if he has questions or concerns related to his previous radiation and prostate cancer.    Nicholos Johns, PA-C

## 2020-12-16 ENCOUNTER — Ambulatory Visit
Admission: RE | Admit: 2020-12-16 | Discharge: 2020-12-16 | Disposition: A | Discharge status: Home / Self Care | Payer: Medicaid Other | Source: Ambulatory Visit | Attending: Radiation Oncology | Admitting: Radiation Oncology

## 2020-12-16 ENCOUNTER — Encounter: Payer: Self-pay | Admitting: Radiation Oncology

## 2020-12-16 DIAGNOSIS — C61 Malignant neoplasm of prostate: Secondary | ICD-10-CM | POA: Insufficient documentation

## 2020-12-19 NOTE — Progress Notes (Signed)
  Radiation Oncology         [phone] ________________________________  Name: [name] MRN: [id number]  Date: 12/16/2020  DOB: 12-Feb-1961  3D Planning Note   Prostate Brachytherapy Post-Implant Dosimetry  Diagnosis: 60 y.o. gentleman with Stage T1c adenocarcinoma of the prostate with Gleason score of 3+3, and PSA of 9.8.  Narrative: On a previous date, Zoey Duckett returned following prostate seed implantation for post implant planning. He underwent CT scan complex simulation to delineate the three-dimensional structures of the pelvis and demonstrate the radiation distribution.  Since that time, the seed localization, and complex isodose planning with dose volume histograms have now been completed.  Results:   Prostate Coverage - The dose of radiation delivered to the 90% or more of the prostate gland (D90) was 108.62% of the prescription dose. This exceeds our goal of greater than 90%. Rectal Sparing - The volume of rectal tissue receiving the prescription dose or higher was 0.0 cc. This falls under our thresholds tolerance of 1.0 cc.  Impression: The prostate seed implant appears to show adequate target coverage and appropriate rectal sparing.  Plan:  The patient will continue to follow with urology for ongoing PSA determinations. I would anticipate a high likelihood for local tumor control with minimal risk for rectal morbidity.  ________________________________  Sheral Apley Tammi Klippel, M.D.

## 2021-02-13 ENCOUNTER — Emergency Department (HOSPITAL_COMMUNITY): Payer: Medicaid Other

## 2021-02-13 ENCOUNTER — Encounter (HOSPITAL_COMMUNITY): Payer: Self-pay | Admitting: *Deleted

## 2021-02-13 ENCOUNTER — Other Ambulatory Visit: Payer: Self-pay

## 2021-02-13 ENCOUNTER — Emergency Department (HOSPITAL_COMMUNITY)
Admission: EM | Admit: 2021-02-13 | Discharge: 2021-02-14 | Disposition: A | Discharge status: Home / Self Care | Payer: Medicaid Other | Attending: Emergency Medicine | Admitting: Emergency Medicine

## 2021-02-13 DIAGNOSIS — Z7951 Long term (current) use of inhaled steroids: Secondary | ICD-10-CM | POA: Insufficient documentation

## 2021-02-13 DIAGNOSIS — D72829 Elevated white blood cell count, unspecified: Secondary | ICD-10-CM | POA: Diagnosis not present

## 2021-02-13 DIAGNOSIS — E871 Hypo-osmolality and hyponatremia: Secondary | ICD-10-CM | POA: Insufficient documentation

## 2021-02-13 DIAGNOSIS — F0390 Unspecified dementia without behavioral disturbance: Secondary | ICD-10-CM | POA: Diagnosis not present

## 2021-02-13 DIAGNOSIS — R1084 Generalized abdominal pain: Secondary | ICD-10-CM

## 2021-02-13 DIAGNOSIS — E878 Other disorders of electrolyte and fluid balance, not elsewhere classified: Secondary | ICD-10-CM | POA: Diagnosis not present

## 2021-02-13 DIAGNOSIS — Z8546 Personal history of malignant neoplasm of prostate: Secondary | ICD-10-CM | POA: Insufficient documentation

## 2021-02-13 DIAGNOSIS — J449 Chronic obstructive pulmonary disease, unspecified: Secondary | ICD-10-CM | POA: Insufficient documentation

## 2021-02-13 DIAGNOSIS — E860 Dehydration: Secondary | ICD-10-CM | POA: Insufficient documentation

## 2021-02-13 DIAGNOSIS — R630 Anorexia: Secondary | ICD-10-CM | POA: Insufficient documentation

## 2021-02-13 LAB — CBC
HCT: 51.3 % (ref 39.0–52.0)
Hemoglobin: 17.4 g/dL — ABNORMAL HIGH (ref 13.0–17.0)
MCH: 30.8 pg (ref 26.0–34.0)
MCHC: 33.9 g/dL (ref 30.0–36.0)
MCV: 90.8 fL (ref 80.0–100.0)
Platelets: 455 10*3/uL — ABNORMAL HIGH (ref 150–400)
RBC: 5.65 MIL/uL (ref 4.22–5.81)
RDW: 13.6 % (ref 11.5–15.5)
WBC: 11.6 10*3/uL — ABNORMAL HIGH (ref 4.0–10.5)
nRBC: 0 % (ref 0.0–0.2)

## 2021-02-13 LAB — URINALYSIS, ROUTINE W REFLEX MICROSCOPIC
Bilirubin Urine: NEGATIVE
Glucose, UA: NEGATIVE mg/dL
Ketones, ur: 5 mg/dL — AB
Nitrite: NEGATIVE
Protein, ur: 100 mg/dL — AB
RBC / HPF: >50 RBC/hpf — ABNORMAL HIGH (ref 0–5)
Specific Gravity, Urine: 1.013 (ref 1.005–1.030)
pH: 5 (ref 5.0–8.0)

## 2021-02-13 LAB — COMPREHENSIVE METABOLIC PANEL
ALT: 17 U/L (ref 0–44)
AST: 15 U/L (ref 15–41)
Albumin: 4 g/dL (ref 3.5–5.0)
Alkaline Phosphatase: 100 U/L (ref 38–126)
Anion gap: 16 — ABNORMAL HIGH (ref 5–15)
BUN: 43 mg/dL — ABNORMAL HIGH (ref 8–23)
CO2: 21 mmol/L — ABNORMAL LOW (ref 22–32)
Calcium: 9.6 mg/dL (ref 8.9–10.3)
Chloride: 96 mmol/L — ABNORMAL LOW (ref 98–111)
Creatinine, Ser: 1.86 mg/dL — ABNORMAL HIGH (ref 0.61–1.24)
GFR, Estimated: 40 mL/min — ABNORMAL LOW (ref >60)
Glucose, Bld: 207 mg/dL — ABNORMAL HIGH (ref 70–99)
Potassium: 4.1 mmol/L (ref 3.5–5.1)
Sodium: 133 mmol/L — ABNORMAL LOW (ref 135–145)
Total Bilirubin: 0.8 mg/dL (ref 0.3–1.2)
Total Protein: 8.2 g/dL — ABNORMAL HIGH (ref 6.5–8.1)

## 2021-02-13 LAB — ETHANOL: Alcohol, Ethyl (B): <10 mg/dL (ref <10)

## 2021-02-13 LAB — LIPASE, BLOOD: Lipase: 38 U/L (ref 11–51)

## 2021-02-13 LAB — TSH: TSH: 1.404 u[IU]/mL (ref 0.350–4.500)

## 2021-02-13 IMAGING — CR DG CHEST 2V
2 series · 2 of 2 positions shown · non-contrast
Comparison: Radiograph [DATE]. Lung bases from abdominal CT
earlier today.

CLINICAL DATA: Shortness of breath and cough.  Weakness.

EXAM:
CHEST - 2 VIEW

[w chest lat]
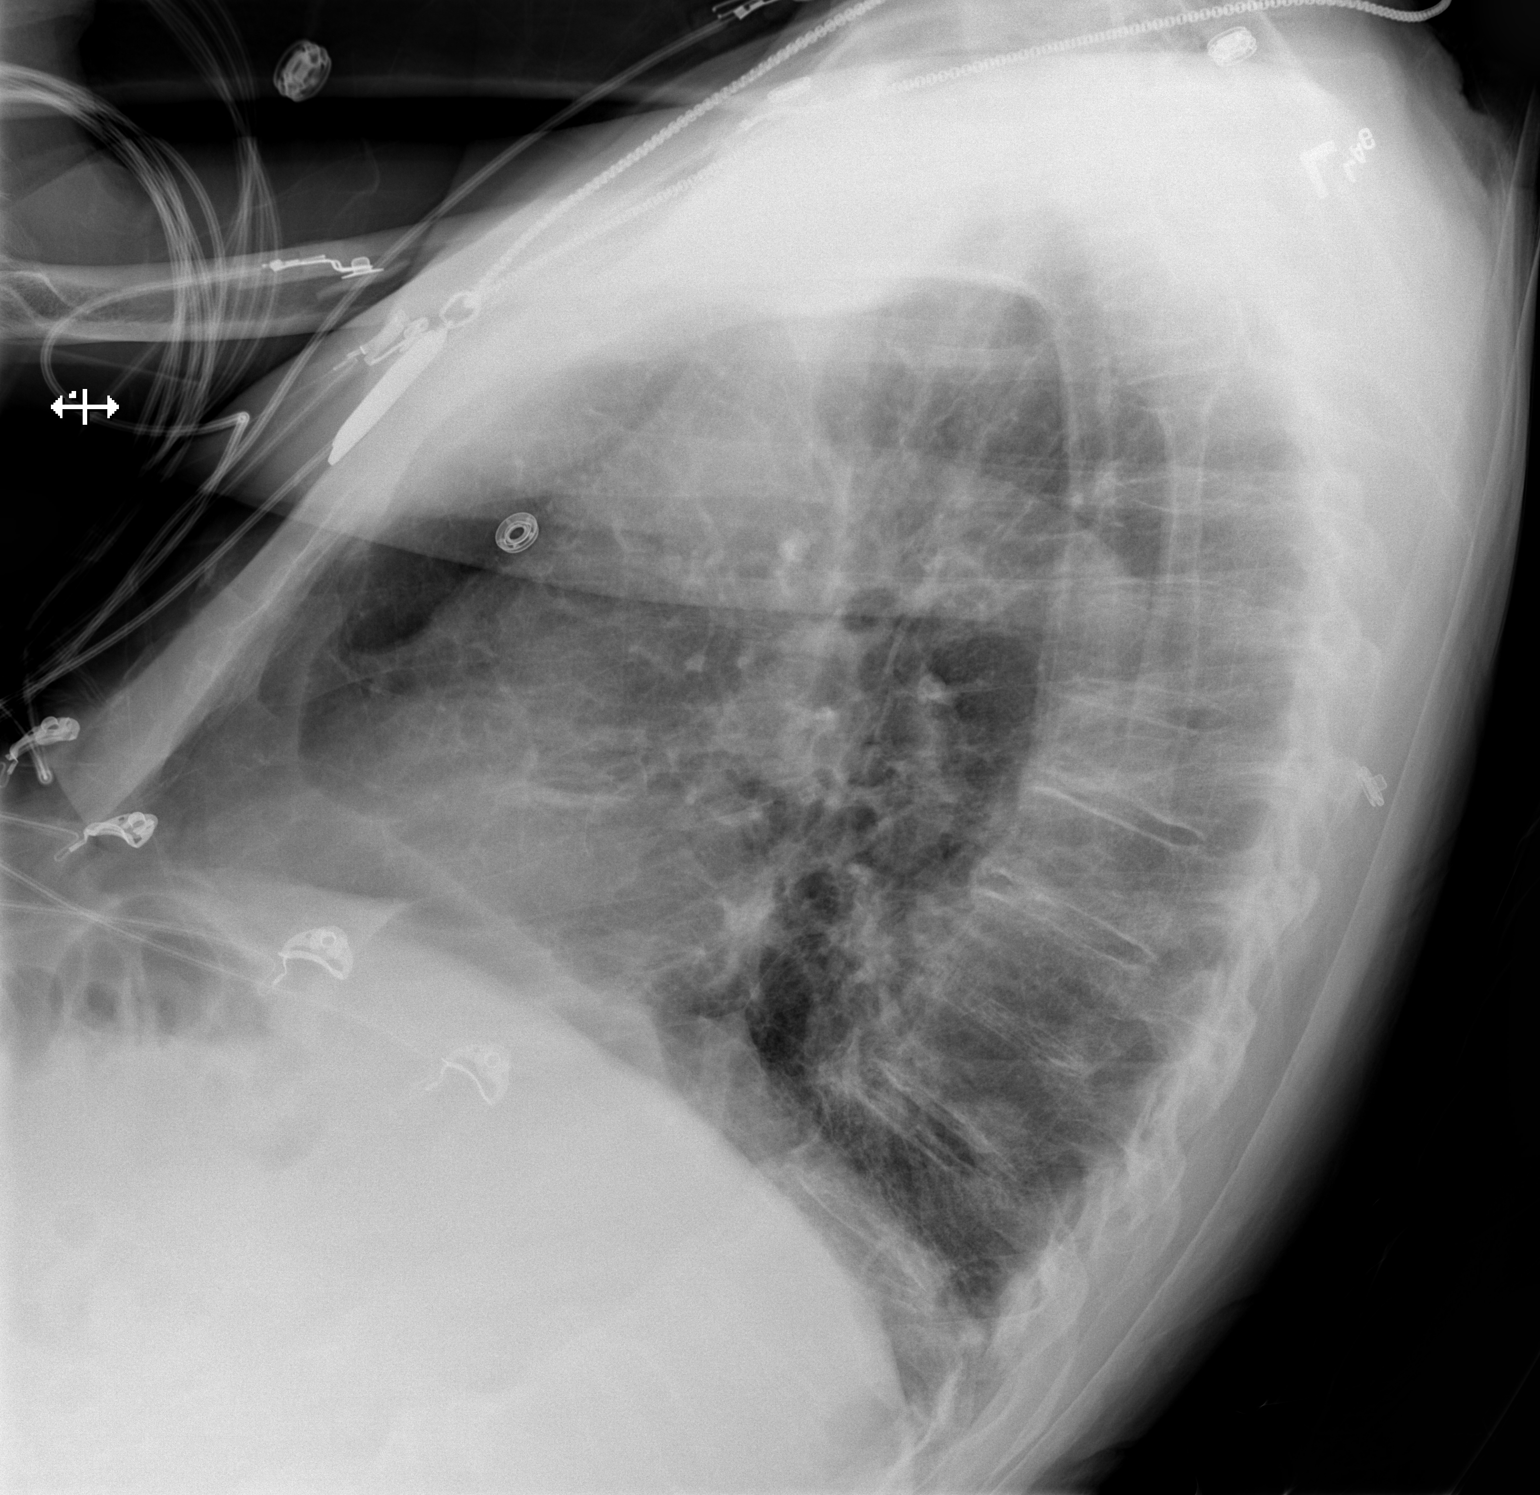

[x chest ap]
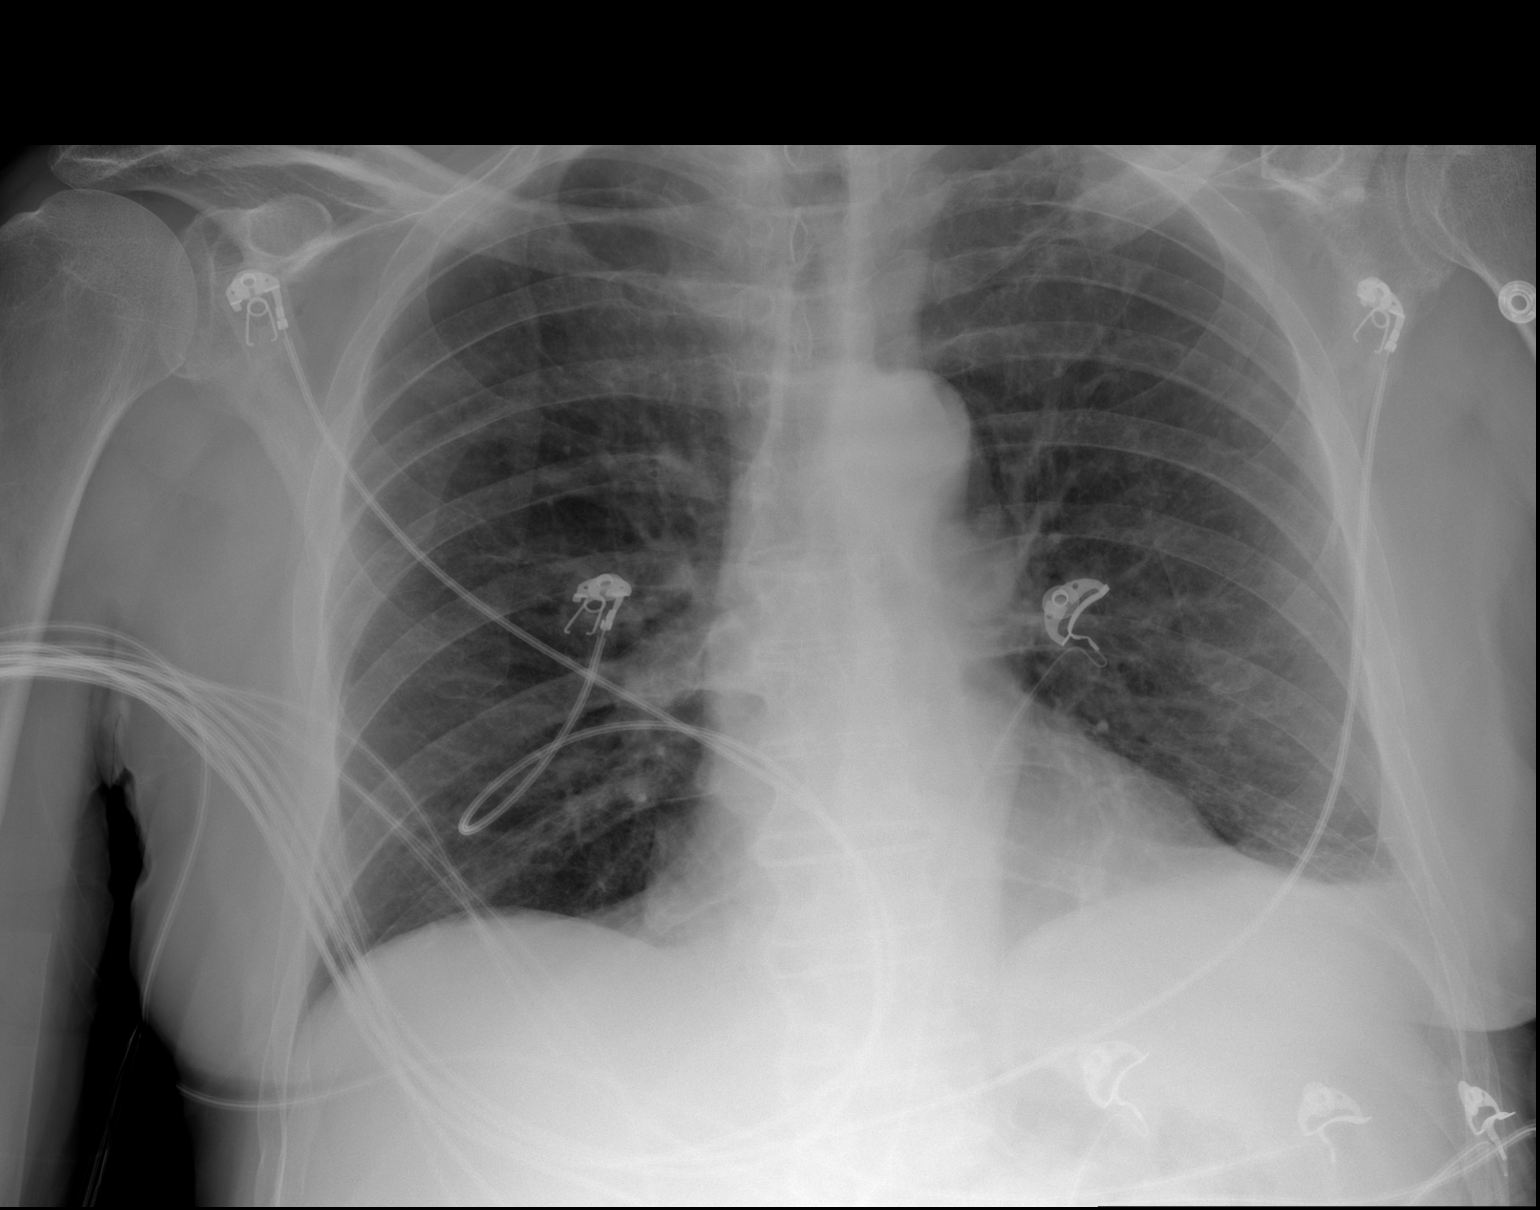

[2 of 2 positions shown; findings below may reference images not displayed]

FINDINGS: Mild left basilar scarring. No acute airspace disease. Normal heart
size and mediastinal contours. No pulmonary edema. No significant
effusion. No pneumothorax no acute osseous abnormalities are seen.
IMPRESSION: Mild left basilar scarring. No acute abnormality.

## 2021-02-13 IMAGING — CT CT ABD-PELV W/O CM
2 of 4 series · 15 of 46 positions shown, 17 images · non-contrast
Comparison: None.

CLINICAL DATA: Abdominal pain, acute, nonlocalized generalized abd
pain for 2 months but much worse today. No BM. Some concern for SBO.

EXAM:
CT ABDOMEN AND PELVIS WITHOUT CONTRAST
TECHNIQUE: Multidetector CT imaging of the abdomen and pelvis was performed
following the standard protocol without IV contrast.

[Series 2: axial st · axial · 0.78mm/px · z∈[-511,-106]mm · 12 of 91 slices shown, 14 images]
[im 5/91  soft-tissue]
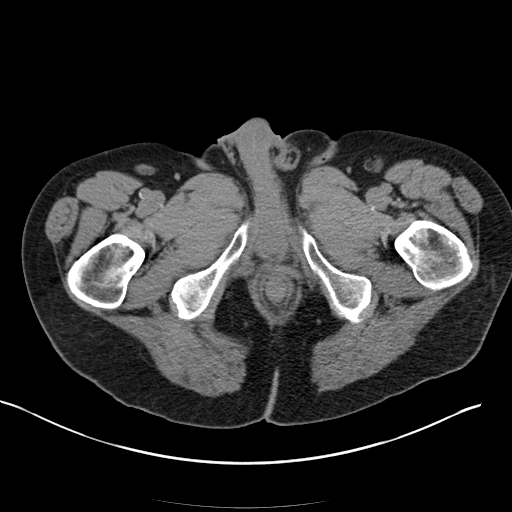
[im 5/91  bone]
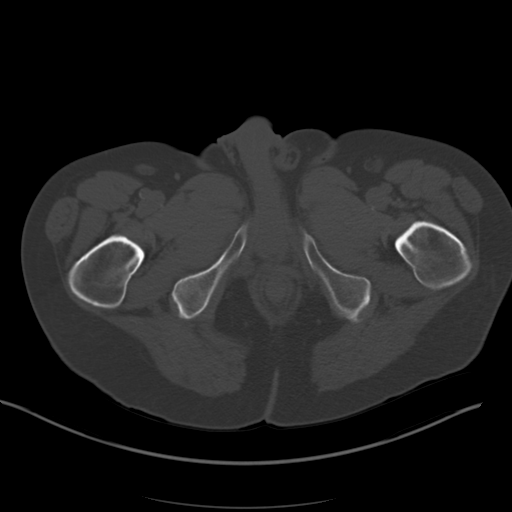
[im 14/91  soft-tissue]
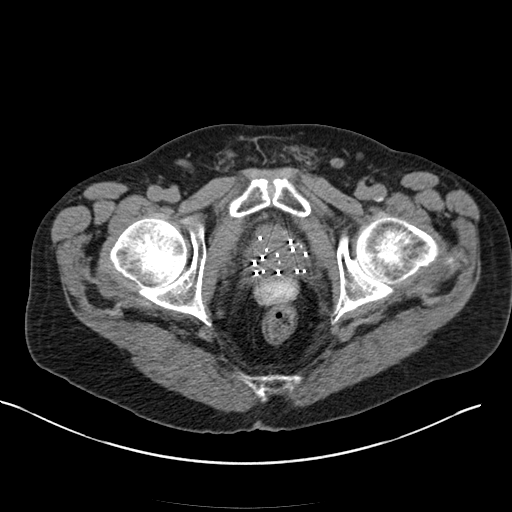
[im 19/91  soft-tissue]
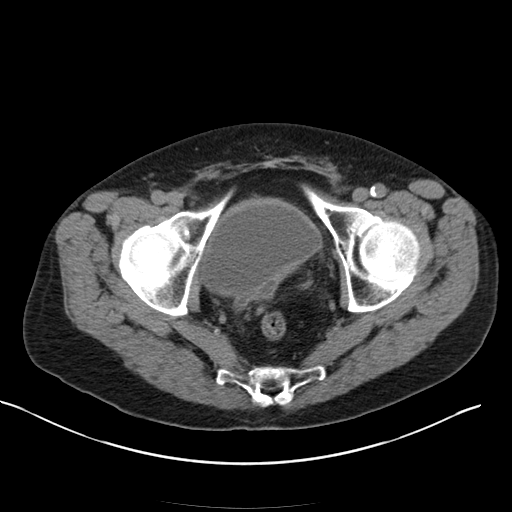
[im 28/91  soft-tissue]
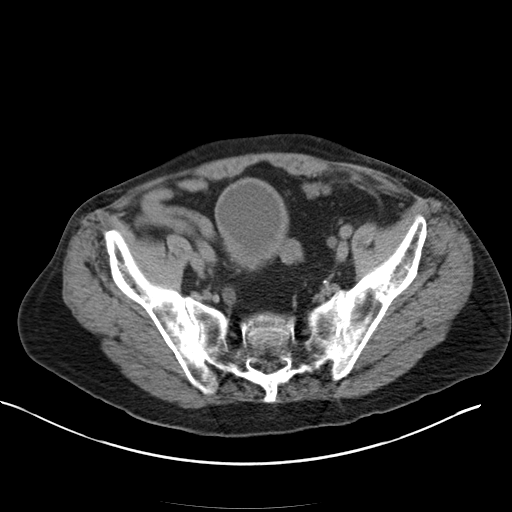
[im 37/91  soft-tissue]
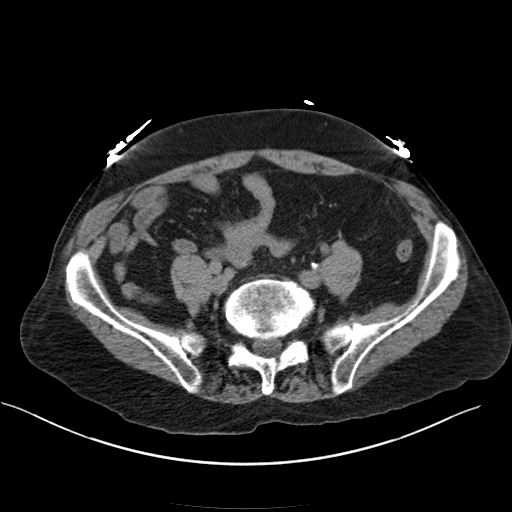
[im 41/91  soft-tissue]
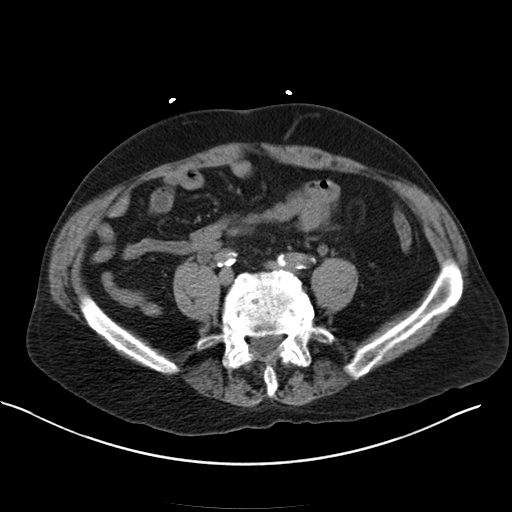
[im 50/91  soft-tissue]
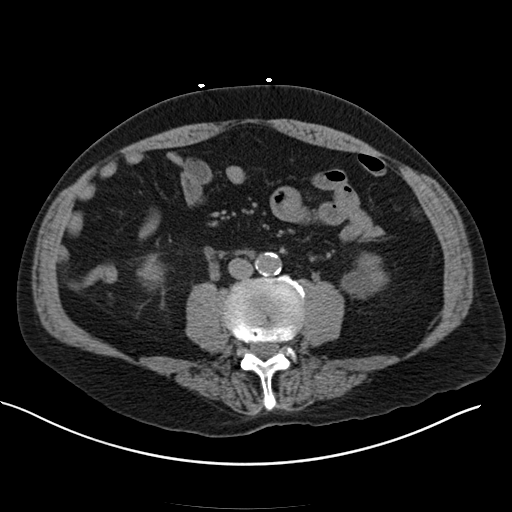
[im 55/91  soft-tissue]
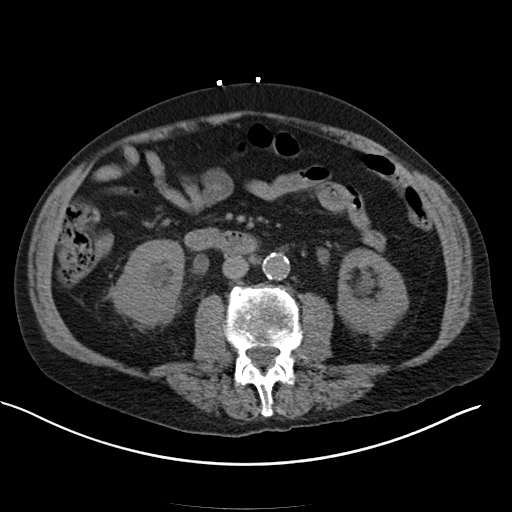
[im 64/91  soft-tissue]
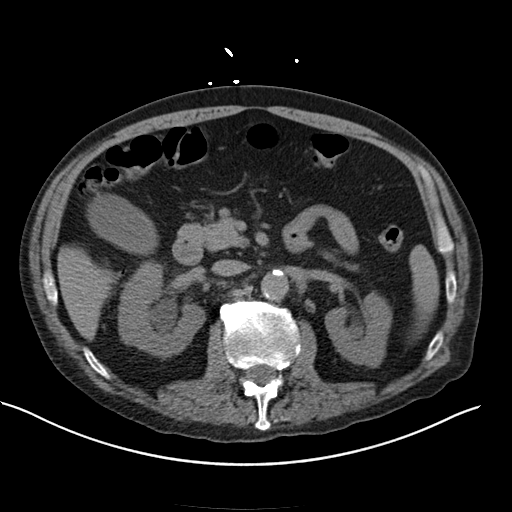
[im 64/91  bone]
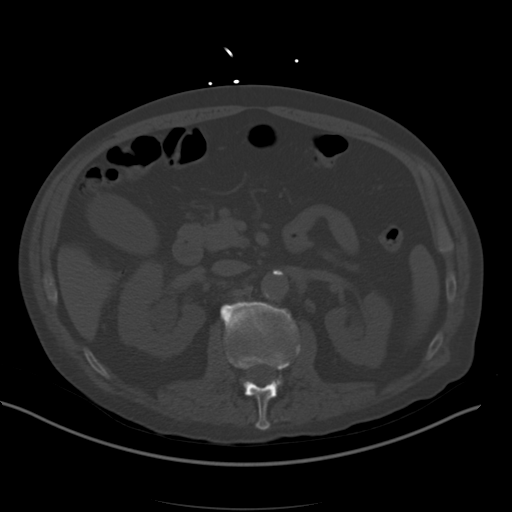
[im 73/91  soft-tissue]
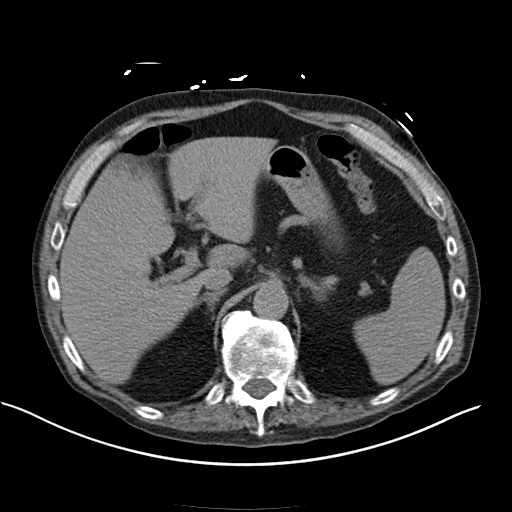
[im 77/91  soft-tissue]
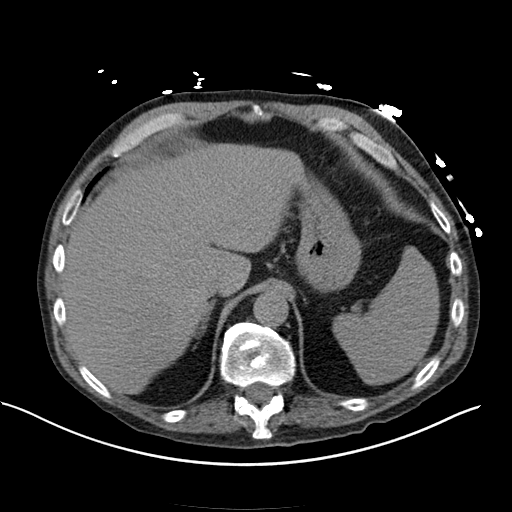
[im 86/91  soft-tissue]
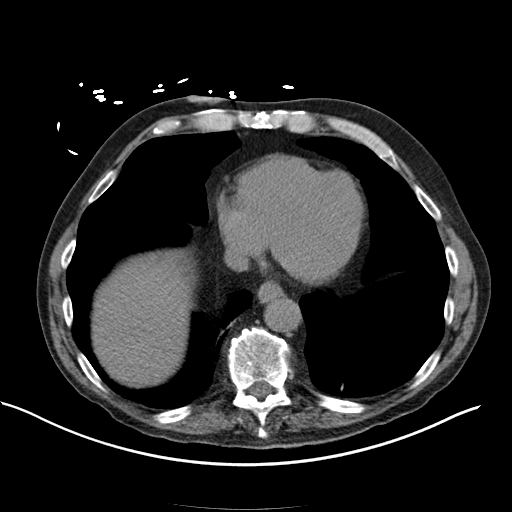

[Series 5: coronal st · coronal · 0.75mm/px · 3 of 139 slices shown]
[im 47/139  soft-tissue]
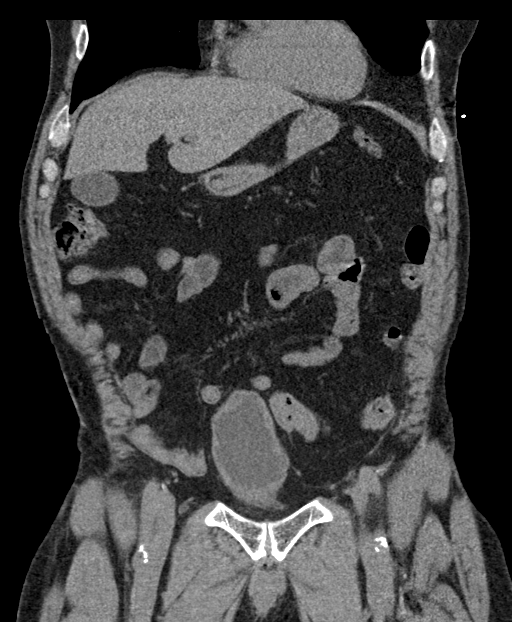
[im 62/139  soft-tissue]
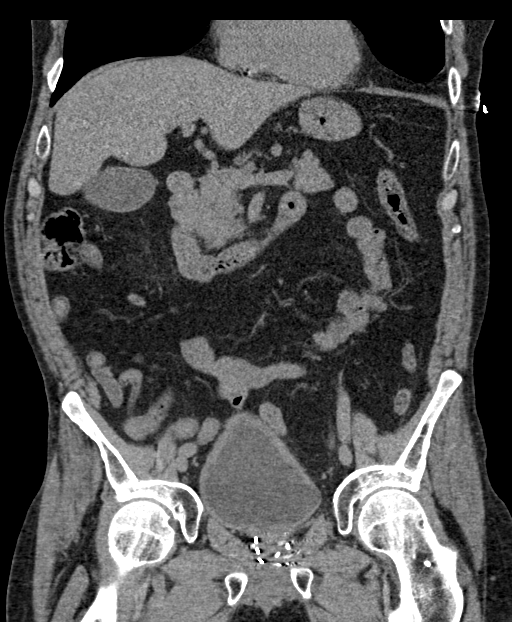
[im 77/139  soft-tissue]
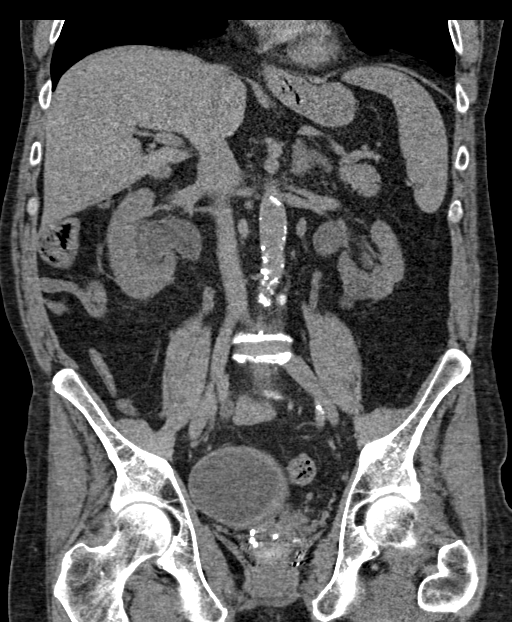

[15 of 46 positions shown; findings below may reference images not displayed]

FINDINGS: Lower chest: Minimal left basilar scarring atelectasis. No acute
airspace disease. No pleural effusion. Coronary artery
calcifications with normal heart size.

Hepatobiliary: Subcapsular low-density in the anterior liver
adjacent to the falciform ligament. This may represent focal fatty
infiltration or cysts, incompletely characterized due to lack
contrast as well as mild motion artifact through this region. There
is no suspicious liver lesion. Gallbladder physiologically
distended, no calcified stone. No biliary dilatation.

Pancreas: No ductal dilatation or inflammation.

Spleen: Normal in size without focal abnormality.

Adrenals/Urinary Tract: 14 mm low-density left adrenal nodule
consistent with adenoma. Slight thickening of the right adrenal
gland without dominant nodule. There is moderate bilateral
hydroureteronephrosis. Both ureters are dilated to the urinary
bladder insertion. No renal or ureteral calculi. There is mild
thinning of the left renal parenchyma. Multiple left renal cysts,
largest in the lower pole measures 2.2 cm. Distended urinary bladder
with irregular wall thickening and trabeculation. Small diverticulum
at the bladder dome.

Stomach/Bowel: Small hiatal hernia. Nondistended stomach. No small
bowel dilatation or obstruction. No small bowel inflammation.
High-riding cecum in the right mid abdomen. Normal appendix courses
into the right upper quadrant. Majority of the colon is
decompressed. There is no colonic wall thickening or inflammation.
Occasional left colonic diverticulosis without diverticulitis. No
increased stool burden.

Vascular/Lymphatic: Moderate aortic atherosclerosis. No aortic
aneurysm. No enlarged lymph nodes in the abdomen or pelvis.

Reproductive: Brachytherapy seeds in the prostate gland.

Other: No free air or ascites.  No abdominal wall hernia.

Musculoskeletal: Chronic appearing T12 compression fracture. No
acute osseous abnormality. No focal bone lesion.
IMPRESSION: 1. Distended urinary bladder with irregular wall thickening and
trabeculation, consistent with chronic bladder outlet obstruction.
Moderate bilateral hydroureteronephrosis. No renal or ureteral
calculi.
2. Brachytherapy seeds in the prostate gland.
3. Small hiatal hernia. Occasional left colonic diverticulosis
without diverticulitis.
4. Chronic appearing T12 compression fracture.
5. A 14 mm low-density left adrenal nodule consistent with adenoma.

Aortic Atherosclerosis ([QK]-[QK]).

## 2021-02-13 MED ORDER — SODIUM CHLORIDE 0.9 % IV BOLUS
1000.0000 mL | Freq: Once | INTRAVENOUS | Status: AC
  Administered 2021-02-13: 1000 mL via INTRAVENOUS

## 2021-02-13 NOTE — ED Triage Notes (Signed)
Pt complains of abdominal pain x 2 months. No nvd.

## 2021-02-13 NOTE — ED Provider Notes (Addendum)
[facility] DEPT Provider Note   CSN: [id number] Arrival date & time: 02/13/21  1724     History Chief Complaint  Patient presents with   Abdominal Pain    Dan Salazar is a 60 y.o. male.  HPI Patient is a 60 year old male with past medical history significant for anxiety, dementia early onset with cognitive ability, HTN, prostate cancer with r seeds implanted, hyponatremia/hypokalemia, also with history of COPD but only uses inhaler as needed.  Patient presents to the emergency department today with complaints of abdominal pain intermittently for 2 months.  He denies any nausea vomiting or diarrhea denies any chest pain shortness of breath lightheadedness or dizziness.  He states that his abdominal pain is achy comes and goes does not seem to have any aggravating or mitigating factors.  He has had a somewhat decreased appetite he states.  Denies any urinary frequency urgency dysuria hematuria.  Notably I discussed with patient that he has a urologist he seems to have somewhat poor insight into why.  He had implants into his prostate for prostate cancer.  Discussed with patient's dear friend and POA [name] he states that patient does not use alcohol.  He states he has been complaining of some abdominal pain for several weeks that he knows of.  Has not been behaving oddly.  Timing explains that patient has baseline cognitive disability which has been explained to him is dementia he states that he has been worked up for this currently.     Past Medical History:  Diagnosis Date   Anxiety    C. difficile diarrhea 2020   hx..resolved   COPD (chronic obstructive pulmonary disease) ([place])    Dementia ([facility])    cogentive ability   Dyspnea    doe with heavy exertion   History of hypertension    no meds since 2020   Pneumonia 11/03/2017   pleural effusion also vats done   Prostate cancer ([place])    RLS (restless legs syndrome)    Wears dentures full     Patient Active Problem List   Diagnosis Date Noted   Malignant neoplasm of prostate ([place]) 07/15/2020   Chronic obstructive lung disease ([place]) 03/08/2020   S/P right inguinal hernia repair 05/23/2018   Difficult intravenous access    Encounter for intubation    Acute respiratory failure with hypoxia ([facility])    Pneumonia    Atelectasis    Pleural effusion on left 10/31/2017   Tachycardia 10/31/2017   Incarcerated inguinal hernia 04/30/2017   Hypokalemia 09/28/2016   Hyponatremia    Metabolic alkalosis    Essential hypertension     Past Surgical History:  Procedure Laterality Date   CYSTOSCOPY N/A 10/25/2020   Procedure: CYSTOSCOPY FLEXIBLE;  Surgeon: Robley Fries, MD;  Location: [facility];  Service: Urology;  Laterality: N/A;  No seeds detected by Dr. Ruffin Frederick BRONCHOSCOPY Left 11/03/2017   Procedure: FLEXIBLE BRONCHOSCOPY;  Surgeon: Melrose Nakayama, MD;  Location: [facility] OR;  Service: Thoracic;  Laterality: Left;   HERNIA REPAIR     Lap diagnostic inguinal hernia repar 04/30/17 Dr. Greer Pickerel   INGUINAL HERNIA REPAIR Left 04/30/2017   Procedure: [place];  Surgeon: Greer Pickerel, MD;  Location: [facility];  Service: General;  Laterality: Left;   INGUINAL HERNIA REPAIR  05/23/2018   INGUINAL HERNIA REPAIR Right 05/23/2018   Procedure: OPEN RIGHT INGUINAL HERNIA REPAIR ERAS PATHWAY;  Surgeon: [name],  [name], MD;  Location: [facility];  Service: General;  Laterality: Right;   INSERTION OF MESH Left 04/30/2017   Procedure: INSERTION OF MESH;  Surgeon: Greer Pickerel, MD;  Location: [facility];  Service: General;  Laterality: Left;   INSERTION OF MESH Right 05/23/2018   Procedure: INSERTION OF MESH;  Surgeon: Greer Pickerel, MD;  Location: [facility];  Service: General;  Laterality: Right;   LAPAROSCOPY N/A 04/30/2017   Procedure: LAPAROSCOPY DIAGNOSTIC;  Surgeon: Greer Pickerel, MD;  Location: [facility];  Service: General;   Laterality: N/A;   OPEN REDUCTION INTERNAL FIXATION (ORIF) DISTAL RADIAL FRACTURE Left 03/02/2013   Procedure: OPEN REDUCTION INTERNAL FIXATION (ORIF) LEFT DISTAL RADIAL FRACTURE;  Surgeon: Jolyn Nap, MD;  Location: [facility];  Service: Orthopedics;  Laterality: Left;   RADIOACTIVE SEED IMPLANT N/A 10/25/2020   Procedure: RADIOACTIVE SEED IMPLANT/BRACHYTHERAPY IMPLANT;  Surgeon: Robley Fries, MD;  Location: [facility];  Service: Urology;  Laterality: N/A;  90 MINS   SPACE OAR INSTILLATION N/A 10/25/2020   Procedure: SPACE OAR INSTILLATION;  Surgeon: Robley Fries, MD;  Location: [facility];  Service: Urology;  Laterality: N/A;   VIDEO ASSISTED THORACOSCOPY (VATS)/EMPYEMA Left 11/03/2017   Procedure: VIDEO ASSISTED THORACOSCOPY (VATS) FOR DRAINAGE OF PLEURAL EFFUSION;  Surgeon: Melrose Nakayama, MD;  Location: [facility];  Service: Thoracic;  Laterality: Left;       Family History  Problem Relation Age of Onset   Lung cancer Father    Breast cancer Neg Hx    Prostate cancer Neg Hx    Colon cancer Neg Hx    Pancreatic cancer Neg Hx     Social History   Tobacco Use   Smoking status: Never   Smokeless tobacco: Never  Vaping Use   Vaping Use: Never used  Substance Use Topics   Alcohol use: Not Currently   Drug use: Yes    Types: Marijuana    Comment: marijuana use last 2 yrs ago    Home Medications Prior to Admission medications   Medication Sig Start Date End Date Taking? Authorizing Provider  dicyclomine (BENTYL) 20 MG tablet Take 1 tablet (20 mg total) by mouth 2 (two) times daily. 02/14/21  Yes Maragret Vanacker S, PA  budesonide-formoterol (SYMBICORT) 160-4.5 MCG/ACT inhaler Inhale 2 puffs into the lungs 2 (two) times daily. Patient not taking: Reported on 11/23/2020 04/14/20   Laurin Coder, MD  HYDROcodone-acetaminophen (NORCO/VICODIN) 5-325 MG tablet Take 1 tablet by mouth every 4 (four) hours as needed for moderate  pain. Patient not taking: Reported on 11/23/2020 10/25/20 10/25/21  Jacalyn Lefevre D, MD  nystatin (MYCOSTATIN/NYSTOP) powder Apply 1 application topically 3 (three) times daily. Patient not taking: Reported on 11/23/2020 10/25/20   Robley Fries, MD    Allergies    Hctz [hydrochlorothiazide]  Review of Systems   Review of Systems  Constitutional:  Negative for chills and fever.  HENT:  Negative for congestion.   Eyes:  Negative for pain.  Respiratory:  Negative for cough and shortness of breath.   Cardiovascular:  Negative for chest pain and leg swelling.  Gastrointestinal:  Positive for abdominal pain (None currently). Negative for vomiting.  Genitourinary:  Negative for dysuria.  Musculoskeletal:  Negative for myalgias.  Skin:  Negative for rash.  Neurological:  Negative for dizziness and headaches.   Physical Exam Updated Vital Signs BP 124/84   Pulse 84   Temp 97.9 F (36.6 C) (Oral)   Resp 17  SpO2 95%   Physical Exam Vitals and nursing note reviewed.  Constitutional:      General: He is not in acute distress.    Comments: Pleasant well-appearing 60 year old.  In no acute distress.  Sitting comfortably in bed.  Able answer questions appropriately follow commands. No increased work of breathing. Speaking in full sentences.   HENT:     Head: Normocephalic and atraumatic.     Nose: Nose normal.  Eyes:     General: No scleral icterus. Cardiovascular:     Rate and Rhythm: Normal rate and regular rhythm.     Pulses: Normal pulses.     Heart sounds: Normal heart sounds.  Pulmonary:     Effort: Pulmonary effort is normal. No respiratory distress.     Breath sounds: No wheezing.  Abdominal:     Palpations: Abdomen is soft.     Tenderness: There is no abdominal tenderness.     Comments: Abdomen soft, nontender.  No guarding or rebound.  No CVA tenderness.  Musculoskeletal:     Cervical back: Normal range of motion.     Right lower leg: No edema.     Left lower leg:  No edema.  Skin:    General: Skin is warm and dry.     Capillary Refill: Capillary refill takes less than 2 seconds.  Neurological:     Mental Status: He is alert. Mental status is at baseline.  Psychiatric:        Mood and Affect: Mood normal.        Behavior: Behavior normal.    ED Results / Procedures / Treatments   Labs (all labs ordered are listed, but only abnormal results are displayed) Labs Reviewed  COMPREHENSIVE METABOLIC PANEL - Abnormal; Notable for the following components:      Result Value   Sodium 133 (*)    Chloride 96 (*)    CO2 21 (*)    Glucose, Bld 207 (*)    BUN 43 (*)    Creatinine, Ser 1.86 (*)    Total Protein 8.2 (*)    GFR, Estimated 40 (*)    Anion gap 16 (*)    All other components within normal limits  CBC - Abnormal; Notable for the following components:   WBC 11.6 (*)    Hemoglobin 17.4 (*)    Platelets 455 (*)    All other components within normal limits  URINALYSIS, ROUTINE W REFLEX MICROSCOPIC - Abnormal; Notable for the following components:   APPearance HAZY (*)    Hgb urine dipstick LARGE (*)    Ketones, ur 5 (*)    Protein, ur 100 (*)    Leukocytes,Ua MODERATE (*)    RBC / HPF >50 (*)    Bacteria, UA RARE (*)    All other components within normal limits  CBG MONITORING, ED - Abnormal; Notable for the following components:   Glucose-Capillary 107 (*)    All other components within normal limits  URINE CULTURE  URINE CULTURE  LIPASE, BLOOD  ETHANOL  TSH    EKG None  Radiology CT ABDOMEN PELVIS WO CONTRAST  Result Date: 02/13/2021 CLINICAL DATA:  Abdominal pain, acute, nonlocalized generalized abd pain for 2 months but much worse today. No BM. Some concern for SBO. EXAM: CT ABDOMEN AND PELVIS WITHOUT CONTRAST TECHNIQUE: Multidetector CT imaging of the abdomen and pelvis was performed following the standard protocol without IV contrast. COMPARISON:  None. FINDINGS: Lower chest: Minimal left basilar scarring atelectasis. No  acute  airspace disease. No pleural effusion. Coronary artery calcifications with normal heart size. Hepatobiliary: Subcapsular low-density in the anterior liver adjacent to the falciform ligament. This may represent focal fatty infiltration or cysts, incompletely characterized due to lack contrast as well as mild motion artifact through this region. There is no suspicious liver lesion. Gallbladder physiologically distended, no calcified stone. No biliary dilatation. Pancreas: No ductal dilatation or inflammation. Spleen: Normal in size without focal abnormality. Adrenals/Urinary Tract: 14 mm low-density left adrenal nodule consistent with adenoma. Slight thickening of the right adrenal gland without dominant nodule. There is moderate bilateral hydroureteronephrosis. Both ureters are dilated to the urinary bladder insertion. No renal or ureteral calculi. There is mild thinning of the left renal parenchyma. Multiple left renal cysts, largest in the lower pole measures 2.2 cm. Distended urinary bladder with irregular wall thickening and trabeculation. Small diverticulum at the bladder dome. Stomach/Bowel: Small hiatal hernia. Nondistended stomach. No small bowel dilatation or obstruction. No small bowel inflammation. High-riding cecum in the right mid abdomen. Normal appendix courses into the right upper quadrant. Majority of the colon is decompressed. There is no colonic wall thickening or inflammation. Occasional left colonic diverticulosis without diverticulitis. No increased stool burden. Vascular/Lymphatic: Moderate aortic atherosclerosis. No aortic aneurysm. No enlarged lymph nodes in the abdomen or pelvis. Reproductive: Brachytherapy seeds in the prostate gland. Other: No free air or ascites.  No abdominal wall hernia. Musculoskeletal: Chronic appearing T12 compression fracture. No acute osseous abnormality. No focal bone lesion. IMPRESSION: 1. Distended urinary bladder with irregular wall thickening and  trabeculation, consistent with chronic bladder outlet obstruction. Moderate bilateral hydroureteronephrosis. No renal or ureteral calculi. 2. Brachytherapy seeds in the prostate gland. 3. Small hiatal hernia. Occasional left colonic diverticulosis without diverticulitis. 4. Chronic appearing T12 compression fracture. 5. A 14 mm low-density left adrenal nodule consistent with adenoma. Aortic Atherosclerosis (ICD10-I70.0). Electronically Signed   By: Keith Rake M.D.   On: 02/13/2021 22:43   DG Chest 2 View  Result Date: 02/13/2021 CLINICAL DATA:  Shortness of breath and cough.  Weakness. EXAM: CHEST - 2 VIEW COMPARISON:  Radiograph 08/25/2020. Lung bases from abdominal CT earlier today. FINDINGS: Mild left basilar scarring. No acute airspace disease. Normal heart size and mediastinal contours. No pulmonary edema. No significant effusion. No pneumothorax no acute osseous abnormalities are seen. IMPRESSION: Mild left basilar scarring. No acute abnormality. Electronically Signed   By: Keith Rake M.D.   On: 02/13/2021 23:04    Procedures Procedures   Medications Ordered in ED Medications  sodium chloride 0.9 % bolus 1,000 mL (0 mLs Intravenous Stopped 02/13/21 2331)    ED Course  I have reviewed the triage vital signs and the nursing notes.  Pertinent labs & imaging results that were available during my care of the patient were reviewed by me and considered in my medical decision making (see chart for details).  Clinical Course as of 02/14/21 0040  Mon Feb 13, 2021  27 Being worked up for new onset dementia. [WF]    Clinical Course User Index [WF] Tedd Sias, [place]   MDM Rules/Calculators/A&P                          Patient is a somewhat poor historian due to dementia however is able to explain his symptoms to me clearly.  He states that he has had 2 months of somewhat generalized intermittent abdominal pain and he describes it as crampy.  He states that it does  not seem be  worse with eating or drinking however he is having less intake over the past week or 2.  He denies any nausea or vomiting no urinary symptoms at all.  He states that he feels fine presently has no symptoms.  Notably he is somewhat tachycardic with a heart rate of 125.  He has dry oral mucosa and CMP is notable for mild hyponatremia, hypochloremia, and mild elevation BUN and creatinine consistent with prerenal AKI. CBC with very mild leukocytosis 11.6 hemoglobin elevated as well as well as platelets.  This is consistent with dehydration.  He is afebrile.  He denies any chest pain or shortness of breath.  Doubt pneumonia, intra-abdominal infection, UTI or other acute infection he has no wounds.  Given that he has had some abdominal pain and he is tachycardic will obtain CT abdomen pelvis without contrast however.  And chest x-ray.  Chest x-ray without a significant acute abnormality.  He notably has some mild left basilar scarring but no other abnormalities.  CT abdomen pelvis without contrast notable for distended urinary bladder also has some mild bilateral hydronephrosis patient is followed by urology has prostate implant for prostate cancer.  He has managed to make urine several times here in the ER.  He has no evidence of obstructive symptoms.  His labs are not consistent with a obstructive AKI.  He was hydrated via IV here in the ER and his tachycardia resolved, blood sugar improved from 207 to 107.  I had a lengthy discussion with patient he understands the necessity of close follow-up with urology and PCP.  He will need to have his BUN/creatinine rechecked soon.  His ethanol is negative TSH within normal limits urine culture is pending his urinalysis showed rare bacteria however he did have some hemoglobin consistent with perhaps recent operation.  Ultimately close follow-up is recommended.  His abdominal pain has not occurred since he has been here in the ER and he did not require any pain medicine  because he was not having any symptoms.  Discharged home with Bentyl.  Strict return precautions given.  I discussed this with his friend Shanon Brow who is his POA and he understands the necessity of close follow-up.  Final Clinical Impression(s) / ED Diagnoses Final diagnoses:  Generalized abdominal pain  Dehydration    Rx / DC Orders ED Discharge Orders          Ordered    dicyclomine (BENTYL) 20 MG tablet  2 times daily        02/14/21 0024             Tedd Sias, PA 02/14/21 0040    Tedd Sias, [place] 02/14/21 0040    Tegeler, Gwenyth Allegra, MD 02/14/21 2047

## 2021-02-13 NOTE — ED Notes (Signed)
Pt drinking monutain dew in consult room seconds before this writer draw pt labs

## 2021-02-14 LAB — CBG MONITORING, ED: Glucose-Capillary: 107 mg/dL — ABNORMAL HIGH (ref 70–99)

## 2021-02-14 MED ORDER — DICYCLOMINE HCL 20 MG PO TABS
20.0000 mg | ORAL_TABLET | Freq: Two times a day (BID) | ORAL | 0 refills | Status: DC
Start: 2021-02-14 — End: 2024-03-10

## 2021-02-14 NOTE — Discharge Instructions (Addendum)
It is vitally important that you follow-up with your urologist as well as your primary care provider.  It seems that you were slightly dehydrated today.  Please have your kidney function tests rechecked in the next week. If you stop peeing please immediately return to the ER given that you have a large prostate which is being treated by your urologist you are at risk for obstruction.  Please decline medications as prescribed including your Symbicort which you should be using twice daily. I have also prescribed a medication called Bentyl to take for abdominal pain.  You may return to the ER for any new or concerning symptoms.

## 2021-02-15 LAB — URINE CULTURE
Culture: 60000 — AB
Special Requests: NORMAL

## 2021-03-21 ENCOUNTER — Encounter: Payer: Self-pay | Admitting: Physician Assistant

## 2021-04-12 ENCOUNTER — Encounter: Payer: Self-pay | Admitting: Physician Assistant

## 2021-04-12 ENCOUNTER — Other Ambulatory Visit (INDEPENDENT_AMBULATORY_CARE_PROVIDER_SITE_OTHER): Payer: Medicaid Other

## 2021-04-12 ENCOUNTER — Ambulatory Visit: Payer: Medicaid Other | Admitting: Physician Assistant

## 2021-04-12 ENCOUNTER — Other Ambulatory Visit: Payer: Self-pay

## 2021-04-12 VITALS — HR 135 | Resp 18 | Ht 74.0 in | Wt 170.0 lb

## 2021-04-12 DIAGNOSIS — R413 Other amnesia: Secondary | ICD-10-CM

## 2021-04-12 LAB — VITAMIN B12: Vitamin B-12: 324 pg/mL (ref 211–911)

## 2021-04-12 MED ORDER — DONEPEZIL HCL 10 MG PO TABS: ORAL_TABLET | ORAL | 11 refills | Status: AC

## 2021-04-12 NOTE — Progress Notes (Addendum)
Assessment/Plan:   Dan Salazar is a 60 y.o. year old male with risk factors including  age, hypertension, prostate cancer sp seed implant, COPD with chronic bronchitis, hyperlipidemia seen today for evaluation of memory loss. Carries a diagnosis of Early Onset Dementia with Behavioral Disturbance from a prior evaluation in 09/2018, lost to follow up. Donepezil 10 mg was prescribed but he never took the medicine. MoCA at the time was 20/30.  MoCA today is 16/30  with deficiencies in visuospatial/executive, memory, attention, language,  delayed recall  3/5, orientation  0/6 .    Recommendations:   Early Onset Dementia with Behavioral Disturbance   MRI brain with/without contrast to assess for underlying structural abnormality and assess vascular load  Check B12 Start Donepezil Take half tablet (5 mg) daily for 2 weeks, then increase to the full tablet at 10 mg daily.  Side Effects discussed  Discussed safety both in and out of the home. Recommend advanced care planning and long term planning as he will eventually need 24/7 supervision.  Discussed the importance of regular daily schedule to maintain brain function.  Continue to monitor mood with PCP.  Stay active at least 30 minutes at least 3 times a week.  Naps should be scheduled and should be no longer than 60 minutes and should not occur after 2 PM.  Folllow up in 2-3 months  Subjective:    The patient is seen in neurologic consultation at the request of Dan Salazar,* for the evaluation of memory.  The patient is accompanied by best friend Dan Salazar his POA who supplements the history. 60 y.o. 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 MoCA at the time was 20/30. Imaging and blood work were ordered, and  prescription for Donepezil 10 mg was written. He developed prostate cancer in the interim in the midst of the Covid pandemic failing to follow up the management of dementia.   On presentation, he states that his memory " is not the best". Dan Salazar adds that his memory is worse, and  he now repeats the questions more frequently. He lives alone, as he is estranged from his children. He is now divorced "after his health issues deteriorated his wife  and family left him"-Dan Salazar says. His mood is more stable at this time, without depression or irritability. He is still very active at the [place] events and spends time with Dan Salazar, his "best friend, a brother". He is not walking frequently. Sleeps "OK, but roll a lot", denies sleepwalking, vivid dreams, hallucinations or paranoia. Dan Salazar agrees. Denies leaving objects in unusual places. Over the last 3-4 months, he "hates to shower, and likes to dress with the same outfit". He has lost significant amount of weight since last visit close to 28-30 lbs despite good appetite. He takes Ensure daily. Denies trouble swallowing, He no longer cooks. Denies any recent falls or head injuries, He no longer drives after he became confused on his way to the supermarket becoming confused, not knowing where he was going,  Denies headaches, denies double vision (used to around 2 yeas ago), dizziness, focal numbness or tingling, unilateral weakness or tremors. Denies urine incontinence or retention. Denies constipation or diarrhea. Denies anosmia. No ETOH Tobacco. Family History mother had memory issues.    History of Present Illness on 11/13/2018:  This is a pleasant 60 year old right-handed man with a history of hypertension, hyperlipidemia, presenting for evaluation of dementia. He reports his memory "ain't as good as it used to be." He reports symptoms started a year ago, however his POA Dan Salazar reports that symptoms started in 2017. 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 In 2017, they started noticing that he would be sent for a job on [place] but he would end up in [place]. This happened 2-3 times, or they would get calls from a customer that he had no idea what he was doing there. Dan Salazar and his church started helping him with bills, they realized some bills were way past due date. They started a disability process for him in 2017 which was granted after SS assessment in 2018. Dan Salazar states he was not diagnosed with dementia, but by a "long medical term that means wrinkling of the brain." Dan Salazar reports he has not been evaluated by a neurologist and as far as he know, no brain imaging has been done. He was admitted for pneumonia complicated by pleural effusion and lung collapse in 2019. Dan Salazar reports that they were told that his nutritional status contributed to this, he was not eating correctly. Church and [place] started helping him with nutrition. He stopped driving in [id number]. He manages his own medications, Dan Salazar tries to check behind him and call to remind him. He endorses a lot of word-finding difficulties. He is able to bathe and dress independently, no hygiene concerns per Dan Salazar, he does his own laundry. He reports his mood is pretty good, no personality changes, paranoia or hallucinations. As far as Dan Salazar know, no wandering behavior at night. He reports sleep is sometimes better than other times, no daytime drowsiness. He lives alone in a [place] house. He is estranged from his daughter and grandson who live in [place], his mother lives in [place].    He has occasional horizontal diplopia and mild back pain. Otherwise he denies any headaches, dizziness, dysarthria/dysphagia, neck pain, focal numbness/tingling/weakness, bowel/bladder dysfunction, anosmia, or tremors. His mother has memory loss. He denies any significant head injuries. He drinks beer occasionally.       Labs 01/1721 TSH 1.404  EKG QT 354, QTC 452, SR with possible LAE  Allergies  Allergen Reactions   Hctz [Hydrochlorothiazide] Other (See Comments)    Severe electrolyte abnormalities    Current Outpatient Medications  Medication Instructions   budesonide-formoterol (SYMBICORT) 160-4.5 MCG/ACT inhaler 2 puffs, Inhalation, 2 times daily   dicyclomine (BENTYL) 20 mg, Oral, 2 times daily   donepezil (ARICEPT) 10 MG tablet Take half tablet (5 mg) daily for 2 weeks, then increase to the full tablet at 10 mg daily   HYDROcodone-acetaminophen (NORCO/VICODIN) 5-325 MG tablet 1 tablet, Oral, Every 4 hours PRN   nystatin (MYCOSTATIN/NYSTOP) powder 1 application, Topical, 3 times daily     VITALS:   Vitals:   04/12/21 0954  Pulse: (!) 135  Resp: 18  SpO2: 95%  Weight: 170 lb (77.1 kg)  Height: '6\' 2"'$  (1.88 m)   No flowsheet data found.  PHYSICAL EXAM   HEENT:Disheveled appearance   Normocephalic, atraumatic.temporal wasting and sunken eyes noted.  The mucous membranes are moist. The superficial temporal arteries are without ropiness or tenderness. Cardiovascular: Regular rate and rhythm. Lungs: Clear to auscultation bilaterally. Neck: There are no carotid bruits noted bilaterally.  NEUROLOGICAL: [place]  Cognitive Assessment  04/12/2021 11/12/2018  Visuospatial/ Executive (0/5) 0 4  Naming (0/3) 3 3  Attention: Read list of digits (0/2) 2 1  Attention: Read list of letters (0/1) 1 1  Attention: Serial 7 subtraction starting at 100 (0/3) 1 2  Language: Repeat phrase (0/2) 2 2  Language : Fluency (0/1) 0 0  Abstraction (0/2) 2 1  Delayed Recall (0/5) 3 0  Orientation (0/6) 2 5  Total 16 19  Adjusted Score (based on education) 16 20   No flowsheet data found.  No flowsheet data found.   Orientation:  Alert and oriented to person,  not to place and time. No aphasia or dysarthria. Fund of knowledge is reduced. Recent and remote memory impaired  Attention and concentration are  normal.  Able to name objects and repeat phrases. Delayed recall  3/5 Cranial nerves: There is good facial symmetry. Extraocular muscles are intact and visual fields are full to confrontational testing. "Sunken eyes". . Speech is  not fluent  but clear. Soft palate rises symmetrically and there is no tongue deviation. Hearing is intact to conversational tone. Tone: Tone is good throughout. Sensation: Sensation is intact to light touch and pinprick throughout. Vibration is intact at the bilateral big toe.There is no extinction with double simultaneous stimulation. There is no sensory dermatomal level identified. Coordination: The patient has no difficulty with RAM's or FNF bilaterally. Normal finger to nose  Motor: Strength is 5/5 in the bilateral upper and lower extremities. There is no pronator drift. There are no fasciculations noted. DTR's: Deep tendon reflexes are 2/4 at the bilateral biceps, triceps, brachioradialis, patella and achilles.  Plantar responses are downgoing bilaterally. Gait and Station: The patient is able to ambulate without difficulty.The patient is able to heel toe walk without any difficulty.The patient is able to ambulate in a tandem fashion. The patient is able to stand in the Romberg position.     Thank you for allowing [place] the opportunity to participate in the care of this nice patient. Please do not hesitate to contact us for any questions or concerns.   Total time spent on today's visit was  60 minutes, including both face-to-face time and nonface-to-face time.  Time included that spent on review of records (prior notes available to me/labs/imaging if pertinent), discussing treatment and goals, answering patient's questions and coordinating care.  Cc:  Dan Mainland, FNP  Sharene Butters 04/13/2021 7:25 AM

## 2021-04-12 NOTE — Patient Instructions (Addendum)
It was a pleasure to see you today at our office.   Recommendations:  MRI of the brain, the radiology office will call you to arrange you appointment We will start donepezil half tablet ('5mg'$ ) daily for 2  weeks.  If you are tolerating the medication, then after 2 weeks, we will increase the dose to a full tablet of 10 mg daily.  Side effects include nausea, vomiting, diarrhea, vivid dreams, and muscle cramps.  Please call the clinic if you experience any of these symptoms.  Check labs today Follow up 2 month    RECOMMENDATIONS FOR ALL PATIENTS WITH MEMORY PROBLEMS: 1. Continue to exercise (Recommend 30 minutes of walking everyday, or 3 hours every week) 2. Increase social interactions - continue going to [place] and enjoy social gatherings with friends and family 3. Eat healthy, avoid fried foods and eat more fruits and vegetables 4. Maintain adequate blood pressure, blood sugar, and blood cholesterol level. Reducing the risk of stroke and cardiovascular disease also helps promoting better memory. 5. Avoid stressful situations. Live a simple life and avoid aggravations. Organize your time and prepare for the next day in anticipation. 6. Sleep well, avoid any interruptions of sleep and avoid any distractions in the bedroom that may interfere with adequate sleep quality 7. Avoid sugar, avoid sweets as there is a strong link between excessive sugar intake, diabetes, and cognitive impairment We discussed the Mediterranean diet, which has been shown to help patients reduce the risk of progressive memory disorders and reduces cardiovascular risk. This includes eating fish, eat fruits and green leafy vegetables, nuts like almonds and hazelnuts, walnuts, and also use olive oil. Avoid fast foods and fried foods as much as possible. Avoid sweets and sugar as sugar use has been linked to worsening of memory function.  There is always a concern of gradual progression of memory problems. If this is the case,  then we may need to adjust level of care according to patient needs. Support, both to the patient and caregiver, should then be put into place.       FALL PRECAUTIONS: Be cautious when walking. Scan the area for obstacles that may increase the risk of trips and falls. When getting up in the mornings, sit up at the edge of the bed for a few minutes before getting out of bed. Consider elevating the bed at the head end to avoid drop of blood pressure when getting up. Walk always in a well-lit room (use night lights in the walls). Avoid area rugs or power cords from appliances in the middle of the walkways. Use a walker or a cane if necessary and consider physical therapy for balance exercise. Get your eyesight checked regularly.   HOME SAFETY: Consider the safety of the kitchen when operating appliances like stoves, microwave oven, and blender. Consider having supervision and share cooking responsibilities until no longer able to participate in those. Accidents with firearms and other hazards in the house should be identified and addressed as well.   ABILITY TO BE LEFT ALONE: If patient is unable to contact 911 operator, consider using LifeLine, or when the need is there, arrange for someone to stay with patients. Smoking is a fire hazard, consider supervision or cessation. Risk of wandering should be assessed by caregiver and if detected at any point, supervision and safe proof recommendations should be instituted.  MEDICATION SUPERVISION: Inability to self-administer medication needs to be constantly addressed. Implement a mechanism to ensure safe administration of the medications.  Mediterranean Diet A Mediterranean diet refers to food and lifestyle choices that are based on the traditions of countries located on the [employer]. This way of eating has been shown to help prevent certain conditions and improve outcomes for people who have chronic diseases, like kidney disease and heart  disease. What are tips for following this plan? Lifestyle  Cook and eat meals together with your family, when possible. Drink enough fluid to keep your urine clear or pale yellow. Be physically active every day. This includes: Aerobic exercise like running or swimming. Leisure activities like gardening, walking, or housework. Get 7-8 hours of sleep each night. If recommended by your health care provider, drink red wine in moderation. This means 1 glass a day for nonpregnant women and 2 glasses a day for men. A glass of wine equals 5 oz (150 mL). Reading food labels  Check the serving size of packaged foods. For foods such as rice and pasta, the serving size refers to the amount of cooked product, not dry. Check the total fat in packaged foods. Avoid foods that have saturated fat or trans fats. Check the ingredients list for added sugars, such as corn syrup. Shopping  At the grocery store, buy most of your food from the areas near the walls of the store. This includes: Fresh fruits and vegetables (produce). Grains, beans, nuts, and seeds. Some of these may be available in unpackaged forms or large amounts (in bulk). Fresh seafood. Poultry and eggs. Low-fat dairy products. Buy whole ingredients instead of prepackaged foods. Buy fresh fruits and vegetables in-season from local farmers markets. Buy frozen fruits and vegetables in resealable bags. If you do not have access to quality fresh seafood, buy precooked frozen shrimp or canned fish, such as tuna, salmon, or sardines. Buy small amounts of raw or cooked vegetables, salads, or olives from the deli or salad bar at your store. Stock your pantry so you always have certain foods on hand, such as olive oil, canned tuna, canned tomatoes, rice, pasta, and beans. Cooking  Cook foods with extra-virgin olive oil instead of using butter or other vegetable oils. Have meat as a side dish, and have vegetables or grains as your main dish. This means  having meat in small portions or adding small amounts of meat to foods like pasta or stew. Use beans or vegetables instead of meat in common dishes like chili or lasagna. Experiment with different cooking methods. Try roasting or broiling vegetables instead of steaming or sauteing them. Add frozen vegetables to soups, stews, pasta, or rice. Add nuts or seeds for added healthy fat at each meal. You can add these to yogurt, salads, or vegetable dishes. Marinate fish or vegetables using olive oil, lemon juice, garlic, and fresh herbs. Meal planning  Plan to eat 1 vegetarian meal one day each week. Try to work up to 2 vegetarian meals, if possible. Eat seafood 2 or more times a week. Have healthy snacks readily available, such as: Vegetable sticks with hummus. Greek yogurt. Fruit and nut trail mix. Eat balanced meals throughout the week. This includes: Fruit: 2-3 servings a day Vegetables: 4-5 servings a day Low-fat dairy: 2 servings a day Fish, poultry, or lean meat: 1 serving a day Beans and legumes: 2 or more servings a week Nuts and seeds: 1-2 servings a day Whole grains: 6-8 servings a day Extra-virgin olive oil: 3-4 servings a day Limit red meat and sweets to only a few servings a month What are my food choices?  Mediterranean diet Recommended Grains: Whole-grain pasta. Brown rice. Bulgar wheat. Polenta. Couscous. Whole-wheat bread. [place]. Vegetables: Artichokes. Beets. Broccoli. Cabbage. Carrots. Eggplant. Green beans. Chard. Kale. Spinach. Onions. Leeks. Peas. Squash. Tomatoes. Peppers. Radishes. Fruits: Apples. Apricots. Avocado. Berries. Bananas. Cherries. Dates. Figs. Grapes. Lemons. Melon. Oranges. Peaches. Plums. Pomegranate. Meats and other protein foods: Beans. Almonds. Sunflower seeds. Pine nuts. Peanuts. [place]. Salmon. Scallops. Shrimp. [place]. Tilapia. Clams. Oysters. Eggs. Dairy: Low-fat milk. Cheese. Greek yogurt. Beverages: Water. Red wine. Herbal tea. Fats and  oils: Extra virgin olive oil. Avocado oil. Grape seed oil. Sweets and desserts: [place] yogurt with honey. Baked apples. Poached pears. Trail mix. Seasoning and other foods: Basil. Cilantro. Coriander. Cumin. Mint. Parsley. Sage. Rosemary. Tarragon. Garlic. Oregano. Thyme. Pepper. Balsalmic vinegar. Tahini. Hummus. Tomato sauce. Olives. Mushrooms. Limit these Grains: Prepackaged pasta or rice dishes. Prepackaged cereal with added sugar. Vegetables: Deep fried potatoes (french fries). Fruits: Fruit canned in syrup. Meats and other protein foods: Beef. Pork. Lamb. Poultry with skin. Hot dogs. [place] Salines. Dairy: Ice cream. Sour cream. Whole milk. Beverages: Juice. Sugar-sweetened soft drinks. Beer. Liquor and spirits. Fats and oils: Butter. Canola oil. Vegetable oil. Beef fat (tallow). Lard. Sweets and desserts: Cookies. Cakes. Pies. Candy. Seasoning and other foods: Mayonnaise. Premade sauces and marinades. The items listed may not be a complete list. Talk with your dietitian about what dietary choices are right for you. Summary The Mediterranean diet includes both food and lifestyle choices. Eat a variety of fresh fruits and vegetables, beans, nuts, seeds, and whole grains. Limit the amount of red meat and sweets that you eat. Talk with your health care provider about whether it is safe for you to drink red wine in moderation. This means 1 glass a day for nonpregnant women and 2 glasses a day for men. A glass of wine equals 5 oz (150 mL). This information is not intended to replace advice given to you by your health care provider. Make sure you discuss any questions you have with your health care provider. Document Released: 03/08/2016 Document Revised: 04/10/2016 Document Reviewed: 03/08/2016 Elsevier Interactive Patient Education  2017 [employer].

## 2021-04-13 ENCOUNTER — Encounter: Payer: Self-pay | Admitting: Physician Assistant

## 2021-04-28 ENCOUNTER — Other Ambulatory Visit: Payer: Medicaid Other

## 2021-05-01 ENCOUNTER — Other Ambulatory Visit: Payer: Self-pay | Admitting: Urology

## 2021-05-04 ENCOUNTER — Ambulatory Visit
Admission: RE | Admit: 2021-05-04 | Discharge: 2021-05-04 | Disposition: A | Discharge status: Home / Self Care | Payer: Medicaid Other | Source: Ambulatory Visit | Attending: Physician Assistant | Admitting: Physician Assistant

## 2021-05-04 IMAGING — MR MR HEAD WO/W CM
13 series · 48 of 48 positions shown · IV contrast (17ml Multihance)
Comparison: Prior head CT examination [DATE].

CLINICAL DATA: Memory loss. Memory loss, history of prostate
cancer.

EXAM:
MRI HEAD WITHOUT AND WITH CONTRAST
TECHNIQUE: Multiplanar, multiecho pulse sequences of the brain and surrounding
structures were obtained without and with intravenous contrast.
CONTRAST:  16mL MULTIHANCE GADOBENATE DIMEGLUMINE 529 MG/ML IV SOLN

[Series 3: T1 · sagittal · 5.0mm · 0.45mm/px · 1 of 25 slices shown]
[im 1/25]
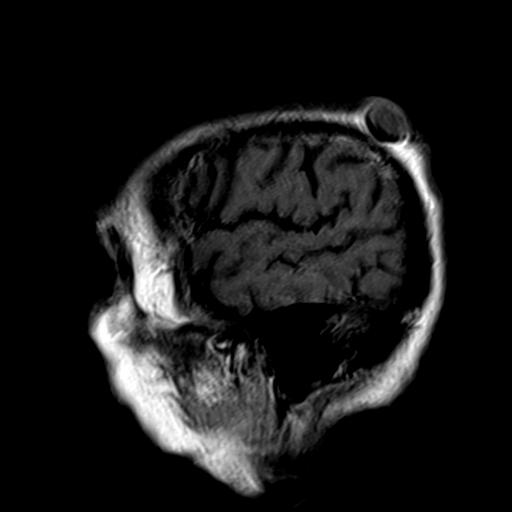

[Series 4: ax ep2d_diff_3 · axial · 3.0mm · 1.80mm/px · z∈[-37,+126]mm · 6 of 109 slices shown]
[im 1/109]
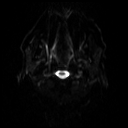
[im 22/109]
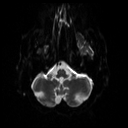
[im 44/109]
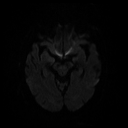
[im 65/109]
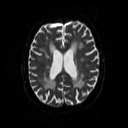
[im 87/109]
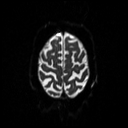
[im 109/109]
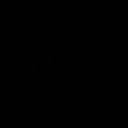

[Series 5: ax ep2d_diff_3_adc · axial · 3.0mm · 1.80mm/px · z∈[-37,+126]mm · 3 of 56 slices shown]
[im 1/56]
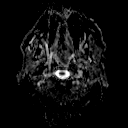
[im 28/56]
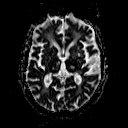
[im 56/56]
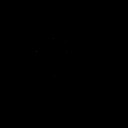

[Series 6: cor ep2d_diff · coronal · 5.0mm · 1.77mm/px · 3 of 59 slices shown]
[im 1/59]
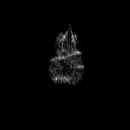
[im 30/59]
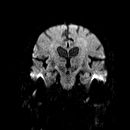
[im 59/59]
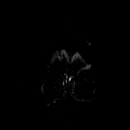

[Series 7: cor ep2d_diff_adc · coronal · 5.0mm · 1.77mm/px · 2 of 30 slices shown]
[im 1/30]
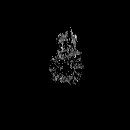
[im 30/30]
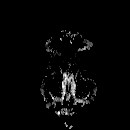

[Series 8: mip_images(sw) · axial · 16.0mm · 0.98mm/px · z∈[-26,+116]mm · 4 of 73 slices shown]
[im 1/73]
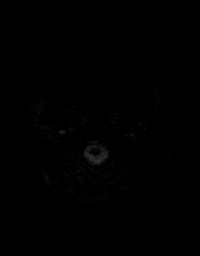
[im 25/73]
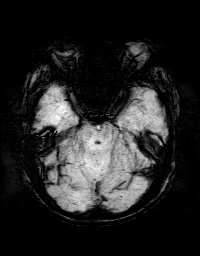
[im 49/73]
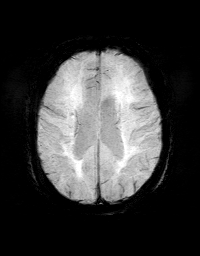
[im 73/73]
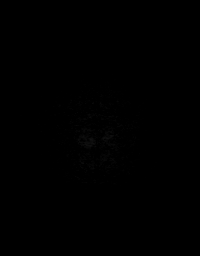

[Series 9: swi_images · axial · 2.0mm · 0.98mm/px · z∈[-32,+123]mm · 4 of 80 slices shown]
[im 1/80]
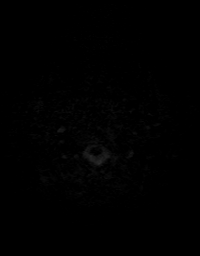
[im 27/80]
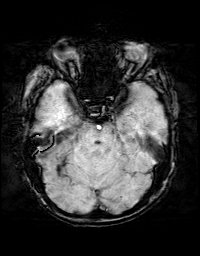
[im 53/80]
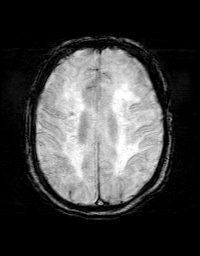
[im 80/80]
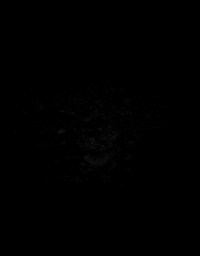

[Series 10: FLAIR · axial · 3.0mm · 0.43mm/px · z∈[-32,+118]mm · 2 of 40 slices shown]
[im 1/40]
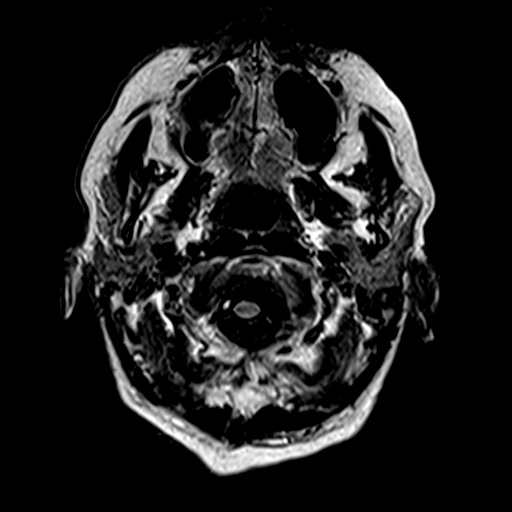
[im 40/40]
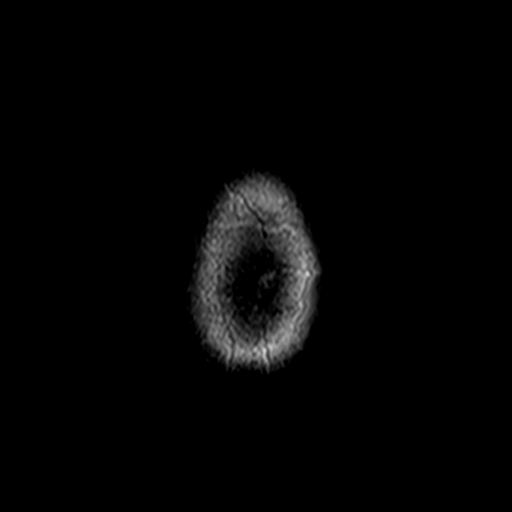

[Series 11: T2 · axial · 5.0mm · 0.65mm/px · z∈[-37,+128]mm · 2 of 29 slices shown (1 of 2)]
[im 1/29]
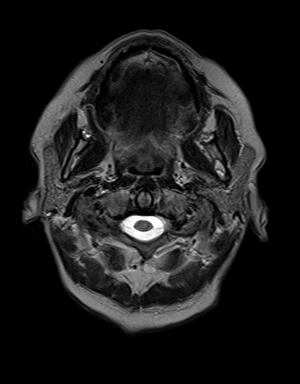
[im 29/29]
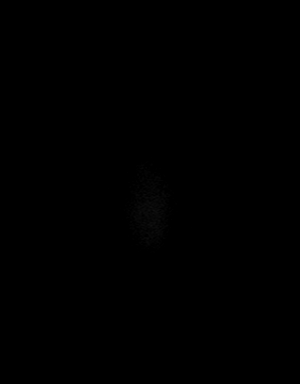

[Series 12: t1_mpr_tra · axial · 1.0mm · 0.72mm/px · z∈[-35,+122]mm · 9 of 160 slices shown]
[im 1/160]
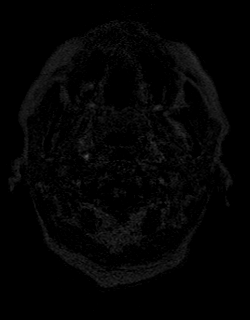
[im 20/160]
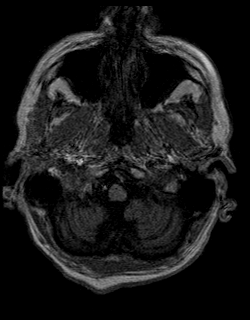
[im 40/160]
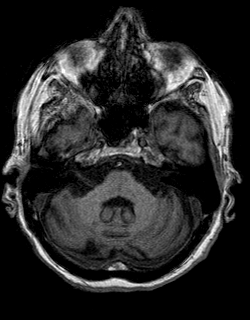
[im 60/160]
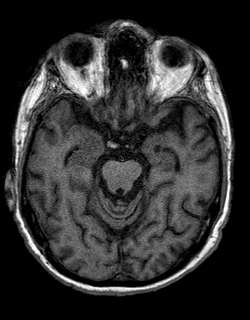
[im 80/160]
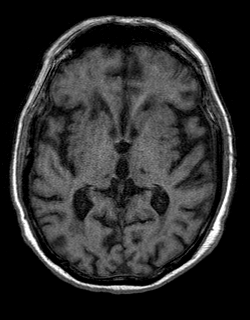
[im 100/160]
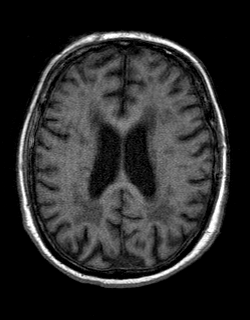
[im 120/160]
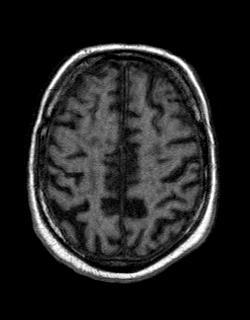
[im 140/160]
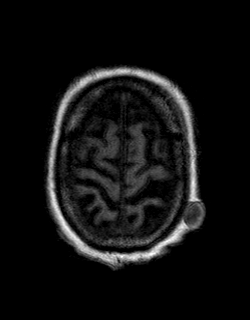
[im 160/160]
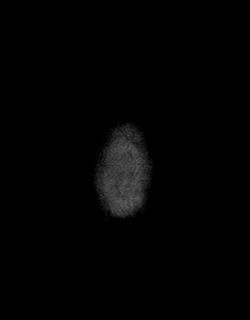

[Series 13: T2 · coronal · 5.0mm · 0.43mm/px · 2 of 28 slices shown (2 of 2)]
[im 1/28]
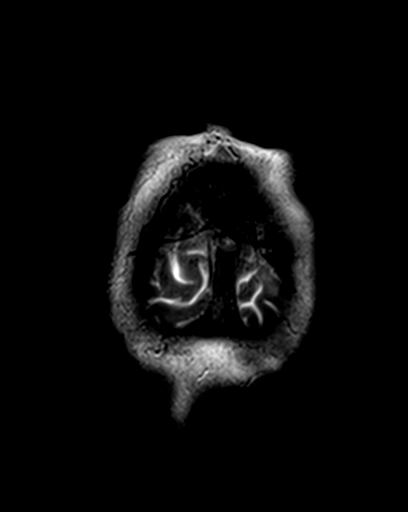
[im 28/28]
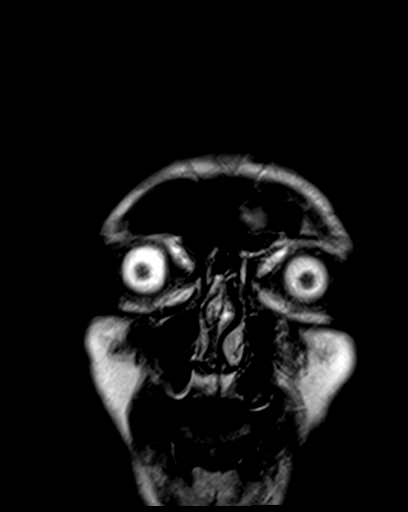

[Series 14: post t1_mpr_tra · axial · 1.0mm · 0.72mm/px · z∈[-35,+122]mm · 9 of 160 slices shown]
[im 1/160]
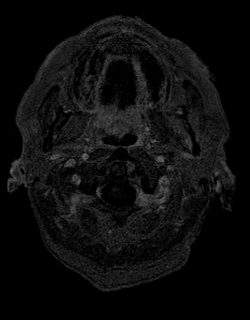
[im 20/160]
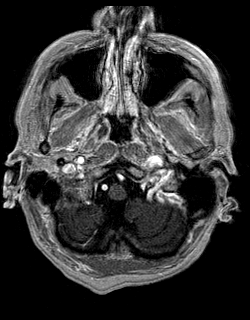
[im 40/160]
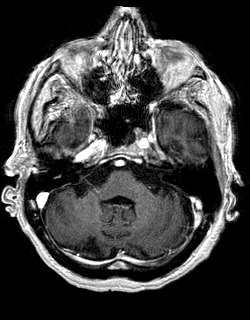
[im 60/160]
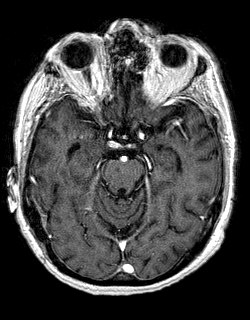
[im 80/160]
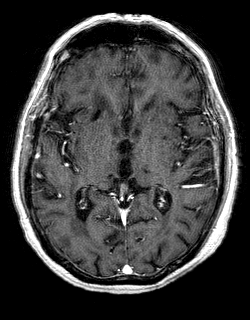
[im 100/160]
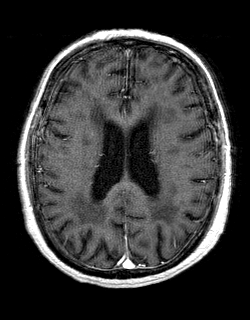
[im 120/160]
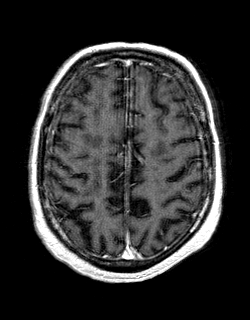
[im 140/160]
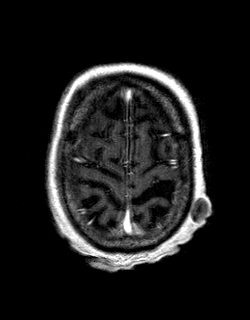
[im 160/160]
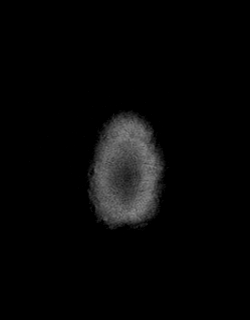

[Series 15: T1 post-contrast · coronal · 5.0mm · 0.72mm/px · 1 of 26 slices shown]
[im 1/26]
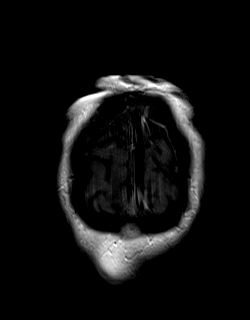

[48 of 48 positions shown; findings below may reference images not displayed]

FINDINGS: Brain:

The examination is intermittently motion degraded, limiting
evaluation. Most notably, there is moderate motion degradation of
the coronal T2 TSE sequence, mild-to-moderate motion degradation of
the axial T1 weighted postcontrast sequence and moderate motion
degradation of the coronal T1 weighted postcontrast sequence.

Mild-to-moderate generalized cerebral and cerebellar atrophy.

Chronic lacunar infarcts within the bilateral cerebral hemispheric
white matter, basal ganglia and thalami. Chronic lacunar infarct
within the left pons.

Background moderate multifocal T2 FLAIR hyperintense signal
abnormality within the cerebral white matter, and to a lesser degree
within the pons, compatible with chronic small vessel ischemic
disease.

Chronic parenchymal microhemorrhage at the central
pontomesencephalic junction.

Small chronic infarct within the right cerebellar hemisphere.

There is no acute infarct.

No evidence of an intracranial mass.

No extra-axial fluid collection.

No midline shift.

No pathologic intracranial enhancement identified.

Vascular: Maintained flow voids within the proximal large arterial
vessels.

Skull and upper cervical spine: No focal suspicious marrow lesion.

Sinuses/Orbits: Visualized orbits show no acute finding. Small
volume frothy secretions and trace mucosal thickening within the
right maxillary sinus. Trace mucosal thickening within the left
maxillary sinus. Mild mucosal thickening versus small mucous
retention cyst within the right sphenoid sinus. Trace mucosal
thickening within the bilateral ethmoid air cells.

Other: Left parietal scalp lesion measuring 1.9 cm, slightly
increased in size as compared to the head CT of [DATE]. Given
indolent growth, this is likely benign and may reflect a sebaceous
cyst.
IMPRESSION: Motion degraded exam, as described.

No evidence of acute intracranial abnormality.

Chronic lacunar infarcts within the bilateral cerebral hemispheric
white matter, basal ganglia, thalami and within the left pons.

Background chronic small vessel ischemic changes which are moderate
in the cerebral white matter, and mild in the pons.

Redemonstrated small chronic infarct within the right cerebellar
hemisphere.

Mild-to-moderate generalized parenchymal atrophy.

Paranasal sinus disease, as outlined.

## 2021-05-04 MED ORDER — GADOBENATE DIMEGLUMINE 529 MG/ML IV SOLN
16.0000 mL | Freq: Once | INTRAVENOUS | Status: AC | PRN
  Administered 2021-05-04: 16 mL via INTRAVENOUS

## 2021-05-10 NOTE — Progress Notes (Signed)
Spoke with patient poa tommy adams cell [phone], pt unable to have surgery on the 18th of October, due to [place] is out of town, tommy to call selita at [facility] to get surgery rescheduled

## 2021-05-16 NOTE — Progress Notes (Addendum)
COVID swab appointment: N/A  COVID Vaccine Completed:  Yes x2 Date COVID Vaccine completed: Has received booster: COVID vaccine manufacturer: Moderna     Date of COVID positive in last 90 days: N/A  PCP - Willey Blade, FNP Cardiologist - N/A Pulmonologist - Sherrilyn Rist, MD  Chest x-ray - 02-13-21 Epic EKG - 02-17-21 Epic Stress Test - unsure ECHO - greater than 2 years, Epic Cardiac Cath - N/A Pacemaker/ICD device last checked: Spinal Cord Stimulator:  Sleep Study - N/A CPAP -   Fasting Blood Sugar - N/A Checks Blood Sugar _____ times a day  Blood Thinner Instructions: N/A Aspirin Instructions: Last Dose:  Activity level:  Can go up a flight of stairs and perform activities of daily living without stopping and without symptoms of chest pain or shortness of breath.      Anesthesia review:  COPD  Patient denies shortness of breath, fever, cough and chest pain at PAT appointment   Patient verbalized understanding of instructions that were given to them at the PAT appointment. Patient was also instructed that they will need to review over the PAT instructions again at home before surgery.

## 2021-05-16 NOTE — Patient Instructions (Addendum)
DUE TO COVID-19 ONLY ONE VISITOR IS ALLOWED TO COME WITH YOU AND STAY IN THE WAITING ROOM ONLY DURING PRE OP AND PROCEDURE.   **NO VISITORS ARE ALLOWED IN THE SHORT STAY AREA OR RECOVERY ROOM!!**         Your procedure is scheduled on: Tuesday, 05-23-21   Report to [facility] Main  Entrance     Report to admitting at 9:45 AM   Call this number if you have problems the morning of surgery [phone]   Do not eat food :After Midnight.   May have liquids until 9:00 AM  day of surgery  CLEAR LIQUID DIET  Foods Allowed                                                                     Foods Excluded  Water, Black Coffee (no milk/no creamer) and tea, regular and decaf                              liquids that you cannot  Plain Jell-O in any flavor  (No red)                         see through such as: Fruit ices (not with fruit pulp)                                 milk, soups, orange juice  Iced Popsicles (No red)                                    All solid food                             Apple juices Sports drinks like Gatorade (No red) Lightly seasoned clear broth or consume(fat free) Sugar    Oral Hygiene is also important to reduce your risk of infection.                                    Remember - BRUSH YOUR TEETH THE MORNING OF SURGERY WITH YOUR REGULAR TOOTHPASTE   Do NOT smoke after Midnight   Take these medicines the morning of surgery with A SIP OF WATER:  Donepezil, Symbicort inhaler   Stop all vitamins and herbal supplements a week before surgery             You may not have any metal on your body including  jewelry, and body piercing             Do not wear  lotions, powders, cologne, or deodorant              Men may shave face and neck.  Do not bring valuables to the hospital. [place].   Contacts, dentures or bridgework may not be worn into surgery.   Patients discharged the day of surgery will not be  allowed to drive home.  Please read over the following fact sheets you were given: IF YOU HAVE QUESTIONS ABOUT YOUR PRE OP INSTRUCTIONS PLEASE CALL [facility] - Preparing for Surgery Before surgery, you can play an important role.  Because skin is not sterile, your skin needs to be as free of germs as possible.  You can reduce the number of germs on your skin by washing with CHG (chlorahexidine gluconate) soap before surgery.  CHG is an antiseptic cleaner which kills germs and bonds with the skin to continue killing germs even after washing. Please DO NOT use if you have an allergy to CHG or antibacterial soaps.  If your skin becomes reddened/irritated stop using the CHG and inform your nurse when you arrive at Short Stay. Do not shave (including legs and underarms) for at least 48 hours prior to the first CHG shower.  You may shave your face/neck.  Please follow these instructions carefully:  1.  Shower with CHG Soap the night before surgery and the  morning of surgery.  2.  If you choose to wash your hair, wash your hair first as usual with your normal  shampoo.  3.  After you shampoo, rinse your hair and body thoroughly to remove the shampoo.                             4.  Use CHG as you would any other liquid soap.  You can apply chg directly to the skin and wash.  Gently with a scrungie or clean washcloth.  5.  Apply the CHG Soap to your body ONLY FROM THE NECK DOWN.   Do   not use on face/ open                           Wound or open sores. Avoid contact with eyes, ears mouth and   genitals (private parts).                       Wash face,  Genitals (private parts) with your normal soap.             6.  Wash thoroughly, paying special attention to the area where your    surgery  will be performed.  7.  Thoroughly rinse your body with warm water from the neck down.  8.  DO NOT shower/wash with your normal soap after using and rinsing off the CHG Soap.                9.   Pat yourself dry with a clean towel.            10.  Wear clean pajamas.            11.  Place clean sheets on your bed the night of your first shower and do not  sleep with pets. Day of Surgery : Do not apply any lotions/deodorants the morning of surgery.  Please wear clean clothes to the hospital/surgery center.  FAILURE TO FOLLOW THESE INSTRUCTIONS MAY RESULT IN THE CANCELLATION OF YOUR SURGERY  PATIENT SIGNATURE_________________________________  NURSE SIGNATURE__________________________________  ________________________________________________________________________

## 2021-05-18 ENCOUNTER — Encounter (HOSPITAL_COMMUNITY)
Admission: RE | Admit: 2021-05-18 | Discharge: 2021-05-18 | Disposition: A | Discharge status: Home / Self Care | Payer: Medicaid Other | Source: Ambulatory Visit | Attending: Urology | Admitting: Urology

## 2021-05-18 ENCOUNTER — Encounter (HOSPITAL_COMMUNITY): Payer: Self-pay

## 2021-05-18 ENCOUNTER — Other Ambulatory Visit: Payer: Self-pay

## 2021-05-18 VITALS — BP 108/79 | HR 66 | Temp 98.1°F | Resp 18 | Ht 74.0 in | Wt 186.0 lb

## 2021-05-18 DIAGNOSIS — D649 Anemia, unspecified: Secondary | ICD-10-CM | POA: Diagnosis not present

## 2021-05-18 DIAGNOSIS — Z01812 Encounter for preprocedural laboratory examination: Secondary | ICD-10-CM | POA: Insufficient documentation

## 2021-05-18 DIAGNOSIS — I10 Essential (primary) hypertension: Secondary | ICD-10-CM | POA: Insufficient documentation

## 2021-05-18 HISTORY — DX: Anemia, unspecified: D64.9

## 2021-05-18 LAB — BASIC METABOLIC PANEL WITH GFR
Anion gap: 8 (ref 5–15)
BUN: 16 mg/dL (ref 8–23)
CO2: 24 mmol/L (ref 22–32)
Calcium: 8.6 mg/dL — ABNORMAL LOW (ref 8.9–10.3)
Chloride: 106 mmol/L (ref 98–111)
Creatinine, Ser: 1.73 mg/dL — ABNORMAL HIGH (ref 0.61–1.24)
GFR, Estimated: 44 mL/min — ABNORMAL LOW
Glucose, Bld: 87 mg/dL (ref 70–99)
Potassium: 4.1 mmol/L (ref 3.5–5.1)
Sodium: 138 mmol/L (ref 135–145)

## 2021-05-18 LAB — CBC
HCT: 41.6 % (ref 39.0–52.0)
Hemoglobin: 12.9 g/dL — ABNORMAL LOW (ref 13.0–17.0)
MCH: 30.9 pg (ref 26.0–34.0)
MCHC: 31 g/dL (ref 30.0–36.0)
MCV: 99.8 fL (ref 80.0–100.0)
Platelets: 224 10*3/uL (ref 150–400)
RBC: 4.17 MIL/uL — ABNORMAL LOW (ref 4.22–5.81)
RDW: 18.4 % — ABNORMAL HIGH (ref 11.5–15.5)
WBC: 7 10*3/uL (ref 4.0–10.5)
nRBC: 0 % (ref 0.0–0.2)

## 2021-05-23 ENCOUNTER — Ambulatory Visit (HOSPITAL_COMMUNITY): Payer: Medicaid Other | Admitting: Emergency Medicine

## 2021-05-23 ENCOUNTER — Encounter (HOSPITAL_COMMUNITY)
Admission: RE | Disposition: A | Discharge status: Home / Self Care | Payer: Self-pay | Source: Home / Self Care | Attending: Urology

## 2021-05-23 ENCOUNTER — Ambulatory Visit (HOSPITAL_COMMUNITY): Payer: Medicaid Other | Admitting: Anesthesiology

## 2021-05-23 ENCOUNTER — Ambulatory Visit (HOSPITAL_COMMUNITY)
Admission: RE | Admit: 2021-05-23 | Discharge: 2021-05-23 | Disposition: A | Discharge status: Home / Self Care | Payer: Medicaid Other | Attending: Urology | Admitting: Urology

## 2021-05-23 ENCOUNTER — Encounter (HOSPITAL_COMMUNITY): Payer: Self-pay | Admitting: Urology

## 2021-05-23 DIAGNOSIS — Z8546 Personal history of malignant neoplasm of prostate: Secondary | ICD-10-CM | POA: Insufficient documentation

## 2021-05-23 DIAGNOSIS — N329 Bladder disorder, unspecified: Secondary | ICD-10-CM | POA: Insufficient documentation

## 2021-05-23 DIAGNOSIS — D494 Neoplasm of unspecified behavior of bladder: Secondary | ICD-10-CM | POA: Diagnosis present

## 2021-05-23 HISTORY — PX: CYSTOSCOPY WITH BIOPSY: SHX5122

## 2021-05-23 SURGERY — CYSTOSCOPY, WITH BIOPSY
Anesthesia: General

## 2021-05-23 MED ORDER — FENTANYL CITRATE (PF) 100 MCG/2ML IJ SOLN
INTRAMUSCULAR | Status: AC
  Filled 2021-05-23: qty 2

## 2021-05-23 MED ORDER — PHENYLEPHRINE 40 MCG/ML (10ML) SYRINGE FOR IV PUSH (FOR BLOOD PRESSURE SUPPORT)
PREFILLED_SYRINGE | INTRAVENOUS | Status: AC
  Filled 2021-05-23: qty 20

## 2021-05-23 MED ORDER — ORAL CARE MOUTH RINSE: 15.0000 mL | Freq: Once | OROMUCOSAL | Status: AC

## 2021-05-23 MED ORDER — PROMETHAZINE HCL 25 MG/ML IJ SOLN: 6.2500 mg | INTRAMUSCULAR | Status: DC | PRN

## 2021-05-23 MED ORDER — PROPOFOL 10 MG/ML IV BOLUS
INTRAVENOUS | Status: DC | PRN
  Administered 2021-05-23: 160 mg via INTRAVENOUS

## 2021-05-23 MED ORDER — FENTANYL CITRATE PF 50 MCG/ML IJ SOSY: 25.0000 ug | PREFILLED_SYRINGE | INTRAMUSCULAR | Status: DC | PRN

## 2021-05-23 MED ORDER — PHENYLEPHRINE 40 MCG/ML (10ML) SYRINGE FOR IV PUSH (FOR BLOOD PRESSURE SUPPORT)
PREFILLED_SYRINGE | INTRAVENOUS | Status: DC | PRN
  Administered 2021-05-23: 120 ug via INTRAVENOUS
  Administered 2021-05-23 (×2): 80 ug via INTRAVENOUS
  Administered 2021-05-23: 160 ug via INTRAVENOUS

## 2021-05-23 MED ORDER — CHLORHEXIDINE GLUCONATE 0.12 % MT SOLN
15.0000 mL | Freq: Once | OROMUCOSAL | Status: AC
  Administered 2021-05-23: 15 mL via OROMUCOSAL

## 2021-05-23 MED ORDER — PROPOFOL 10 MG/ML IV BOLUS
INTRAVENOUS | Status: AC
  Filled 2021-05-23: qty 20

## 2021-05-23 MED ORDER — MIDAZOLAM HCL 2 MG/2ML IJ SOLN
INTRAMUSCULAR | Status: AC
  Filled 2021-05-23: qty 2

## 2021-05-23 MED ORDER — LIDOCAINE 2% (20 MG/ML) 5 ML SYRINGE
INTRAMUSCULAR | Status: DC | PRN
  Administered 2021-05-23: 60 mg via INTRAVENOUS

## 2021-05-23 MED ORDER — MIDAZOLAM HCL 5 MG/5ML IJ SOLN
INTRAMUSCULAR | Status: DC | PRN
  Administered 2021-05-23: 2 mg via INTRAVENOUS

## 2021-05-23 MED ORDER — LACTATED RINGERS IV SOLN: INTRAVENOUS | Status: DC

## 2021-05-23 MED ORDER — CEFAZOLIN SODIUM-DEXTROSE 2-4 GM/100ML-% IV SOLN
2.0000 g | INTRAVENOUS | Status: AC
  Administered 2021-05-23: 2 g via INTRAVENOUS
  Filled 2021-05-23: qty 100

## 2021-05-23 MED ORDER — ONDANSETRON HCL 4 MG/2ML IJ SOLN
INTRAMUSCULAR | Status: DC | PRN
  Administered 2021-05-23: 4 mg via INTRAVENOUS

## 2021-05-23 MED ORDER — STERILE WATER FOR IRRIGATION IR SOLN
Status: DC | PRN
  Administered 2021-05-23: 3000 mL

## 2021-05-23 MED ORDER — FENTANYL CITRATE (PF) 100 MCG/2ML IJ SOLN
INTRAMUSCULAR | Status: DC | PRN
  Administered 2021-05-23: 100 ug via INTRAVENOUS

## 2021-05-23 MED ORDER — DEXAMETHASONE SODIUM PHOSPHATE 10 MG/ML IJ SOLN
INTRAMUSCULAR | Status: DC | PRN
  Administered 2021-05-23: 5 mg via INTRAVENOUS

## 2021-05-23 MED ORDER — SODIUM CHLORIDE 0.9 % IR SOLN
Status: DC | PRN
  Administered 2021-05-23: 3000 mL

## 2021-05-23 SURGICAL SUPPLY — 12 items
BAG DRAIN URO-CYSTO SKYTR STRL (DRAIN) ×2 IMPLANT
CLOTH BEACON ORANGE TIMEOUT ST (SAFETY) ×2 IMPLANT
ELECT REM PT RETURN 9FT ADLT (ELECTROSURGICAL) ×2
ELECTRODE REM PT RTRN 9FT ADLT (ELECTROSURGICAL) ×1 IMPLANT
GLOVE SURG ENC MOIS LTX SZ6.5 (GLOVE) ×2 IMPLANT
GOWN STRL REUS W/TWL LRG LVL3 (GOWN DISPOSABLE) ×2 IMPLANT
KIT TURNOVER CYSTO (KITS) ×2 IMPLANT
MANIFOLD NEPTUNE II (INSTRUMENTS) ×2 IMPLANT
PACK CYSTO (CUSTOM PROCEDURE TRAY) ×2 IMPLANT
TUBE CONNECTING 12X1/4 (SUCTIONS) ×2 IMPLANT
TUBING UROLOGY SET (TUBING) ×2 IMPLANT
WATER STERILE IRR 3000ML UROMA (IV SOLUTION) ×2 IMPLANT

## 2021-05-23 NOTE — H&P (Signed)
CC/HPI: cc: Prostate cancer    PSA of 9.8  TRUS prostate biopsy on 05/05/2020 with pathology showing Gleason 3+3=6 in 4/12 cores.  Prostate size 46 g.  brachytherapy and SpaceOAR placement 10/16/2020   04/13/21: 60 yo man with history of prostate cancer Gleason 3+3=6 treated with brachytherapy in March 2022 here for follow-up. At time of brachytherapy he was found to have a growth in his bladder and was to follow-up for cystoscopy. He then developed urinary retention and cystoscopy had been postpone. He is here today for cysto however his urinalysis is grossly abnormal and concerning for infection. In the interim patient has been seen for early-onset dementia and started on donazepil. PVR today 178 cc.    04/25/21: Here today for cystoscopy. Patient unable to leave urine sample. He has been on Macrobid since his last visit.     ALLERGIES: NKDA    MEDICATIONS: Tamsulosin Hcl 0.4 mg capsule 1 capsule PO Daily  Tamsulosin Hcl 0.4 mg capsule 1 capsule PO Q HS     GU PSH: Prostate Needle Biopsy - 05/05/2020 TRANSPERI NEEDLE PLACE, PROS - 10/25/2020 Transperineal Plmt Biodegradable Matrl 1/Mlt Njx - 10/25/2020     NON-GU PSH: Hernia Repair, Left - 2018, Left - 2018 Laparoscopy, Surgical; Implantation Of Mesh Or Other Prosthesis For Incisional Or Ventral Hernia Rep - 2018 Surgical Pathology, Gross And Microscopic Examination For Prostate Needle - 05/05/2020     GU PMH: Acute Cystitis/UTI - 04/13/2021 BPH w/LUTS - 04/13/2021 Incomplete bladder emptying - 04/13/2021 Prostate Cancer - 04/13/2021, - 03/14/2021, - 11/01/2020, Reviewed and provided copy of biopsy pathology report (Gleason 3+3=6, Grade group 1, low risk prostate cancer). Counseled regarding diagnosis, gleason grade system, clinical staging system and patient's own diagnosis grade and stage counseled regarding prostate cancer treatment including active surveillance, radiation, and surgery. Counseled patient regarding options for curative  treatment including surgery, open and robotic surgery counseled regarding hospitalization and recovery including indwelling catheter for 10 days and post operative restrictions such as lifting restriction less than 10-15 pounds for 4-6 weeks counseled regarding risks including but not limited to operative risks such as heart attack and stroke and post treatment side effects such as incomplete cure of cancer, need for adjuvant therapy, urinary incontinence and erectile dysfunction requiring treatment or intervention Such interventions for incontinence and ED discussed. provided AUA foundation literature regarding prostate cancer and Discussed the MSK pretreatment nomograms. Patient with [99%] 15 yr CSS and [82%] 10 yr progression free survival after prostatectomy. Also discussed need for lifelong surveillance and options for secondary treatment should that be necessary. [email or web address] patient expressed understanding regarding all above answered all current questions , - 05/12/2020 Obstructive and reflux uropathy, Unspec - 03/14/2021, - 02/24/2021 Urinary Retention - 03/14/2021, - 03/03/2021, - 02/24/2021 Elevated PSA - 03/03/2021, - 05/05/2020, Discussed possible reasons for elevated PSA including recent infection, trauma, inflammation, indwelling catheter, enlarged prostate and prostate cancer. I would like to schedule transrectal ultrasound guided prostate biopsy for patient. I discussed risks and benefits of prostate biospy including blood in the urine/stool/semen, pain, and risk of post biopsy sepsis. He was given instructions regarding the prostate biospy. He will take 1 tab of levaquin 750mg  PO the morning of his prostate biopsy. Patient understands and agrees with the above. , - 03/25/2020 Oth hypospadias, He has a subcoronal hypospadias. Foley catheter inserted without any noted resistance. - 02/24/2021 Unil Inguinal Hernia W/obst, W/O gang, non-recurrent      PMH  Notes: Memory Loss   NON-GU PMH: Erythema intertrigo -  03/03/2021 Atelectasis Chronic respiratory failure with hypercapnia Hypertension Hypokalemia Hypo-osmolality and hyponatremia Pleural effusion, not elsewhere classified Pneumonia, unspecified organism Tachycardia, unspecified    FAMILY HISTORY: 1 Daughter - Runs in Family   SOCIAL HISTORY: Marital Status: Single Ethnicity: Not Hispanic Or Latino; Race: White Current Smoking Status: Patient has never smoked.   Tobacco Use Assessment Completed: Used Tobacco in last 30 days? Has never drank.  Drinks 2 caffeinated drinks per day.    REVIEW OF SYSTEMS:    GU Review Male:   Patient denies frequent urination, hard to postpone urination, burning/ pain with urination, get up at night to urinate, leakage of urine, stream starts and stops, trouble starting your stream, have to strain to urinate , erection problems, and penile pain.  Gastrointestinal (Upper):   Patient denies nausea, vomiting, and indigestion/ heartburn.  Gastrointestinal (Lower):   Patient denies diarrhea and constipation.  Constitutional:   Patient denies fever, night sweats, weight loss, and fatigue.  Skin:   Patient denies skin rash/ lesion and itching.  Eyes:   Patient denies blurred vision and double vision.  Ears/ Nose/ Throat:   Patient denies sore throat and sinus problems.  Hematologic/Lymphatic:   Patient denies swollen glands and easy bruising.  Cardiovascular:   Patient denies leg swelling and chest pains.  Respiratory:   Patient denies cough and shortness of breath.  Endocrine:   Patient denies excessive thirst.  Musculoskeletal:   Patient denies back pain and joint pain.  Neurological:   Patient denies headaches and dizziness.  Psychologic:   Patient denies depression and anxiety.   VITAL SIGNS: None   GU PHYSICAL EXAMINATION:    Penis: Glanular hypospadias   MULTI-SYSTEM PHYSICAL EXAMINATION:    Constitutional: Well-nourished. No physical  deformities. Normally developed. Good grooming.  Neck: Neck symmetrical, not swollen. Normal tracheal position.  Respiratory: No labored breathing, no use of accessory muscles.   Skin: No paleness, no jaundice, no cyanosis. No lesion, no ulcer, no rash.  Neurologic / Psychiatric: Oriented to time, oriented to place, oriented to person. No depression, no anxiety, no agitation.  Gastrointestinal: No rigidity, non obese abdomen.   Eyes: Normal conjunctivae. Normal eyelids.  Ears, Nose, Mouth, and Throat: Left ear no scars, no lesions, no masses. Right ear no scars, no lesions, no masses. Nose no scars, no lesions, no masses. Normal hearing. Normal lips.  Musculoskeletal: Normal gait and station of head and neck.     Complexity of Data:  Records Review:   Previous Patient Records, POC Tool  Urine Test Review:   Urinalysis   03/14/21  PSA  Total PSA 0.59 ng/mL    PROCEDURES:         Flexible Cystoscopy - 52000  Risks, benefits, and some of the potential complications of the procedure were discussed at length with the patient including infection, bleeding, voiding discomfort, urinary retention, fever, chills, sepsis, and others. All questions were answered. Informed consent was obtained. Sterile technique and intraurethral analgesia were used.  Meatus:  Normal size. Normal location. Normal condition.  Urethra:  No strictures.  External Sphincter:  Normal.  Verumontanum:  Normal.  Prostate:  Obstructing lateral lobes.   Bladder Neck:  Non-obstructing.  Ureteral Orifices:  Normal location. Normal size. Normal shape. Effluxed clear urine.  Bladder:  Severe trabeculation. Visualization poor due to debris. Bladder tumor not identified.      The lower urinary tract was carefully examined. The procedure was well-tolerated and without complications. Antibiotic instructions were given. Instructions were given to call  the office immediately for bloody urine, difficulty urinating, urinary retention,  painful or frequent urination, fever, chills, nausea, vomiting or other illness. The patient stated that he understood these instructions and would comply with them.         Urinalysis w/Scope Dipstick Dipstick Cont'd Micro  Color: Yellow Bilirubin: Neg mg/dL WBC/hpf: >60/hpf  Appearance: Turbid Ketones: Neg mg/dL RBC/hpf: 20 - 40/hpf  Specific Gravity: 1.025 Blood: 2+ ery/uL Bacteria: Many (>50/hpf)  pH: 6.0 Protein: 2+ mg/dL Cystals: NS (Not Seen)  Glucose: Neg mg/dL Urobilinogen: 0.2 mg/dL Casts: NS (Not Seen)    Nitrites: Positive Trichomonas: Not Present    Leukocyte Esterase: 3+ leu/uL Mucous: Present      Epithelial Cells: 0 - 5/hpf      Yeast: NS (Not Seen)      Sperm: Not Present    Notes: microscopic not concentrated    ASSESSMENT:      ICD-10 Details  1 GU:   Acute Cystitis/UTI - X44.81 Acute, Uncomplicated  2   BPH w/LUTS - N40.1 Chronic, Worsening  3   Incomplete bladder emptying - R39.14 Chronic, Worsening  4   Prostate Cancer - C61 Chronic, Stable   PLAN:            Medications New Meds: Bactrim Ds 800 mg-160 mg tablet 1 tablet PO BID   #20  0 Refill(s)  Tamsulosin Hcl 0.4 mg capsule 1 capsule PO Q HS   #90  3 Refill(s)            Orders Labs CULTURE, URINE          Document Letter(s):  Created for Patient: Clinical Summary         Notes:   Urine still appears to be infected however patient remains on antibiotics and we discussed the risks and benefits of proceeding with cystoscopy. We decided to go ahead with cystoscopy. Unfortunately visualization was poor due to debris. We talked about diagnostic cystoscopy in the operating room verses CT scan. Due to patient's change in cognitive function decided to proceed with cystoscopy in OR so that possible intervention could be done at the same time to limit visits back to the office. In addition I will start him on tamsulosin to help with urinary symptoms and change antibiotic to Bactrim. Urine to be sent for  culture as well.

## 2021-05-23 NOTE — Interval H&P Note (Signed)
History and Physical Interval Note:  05/23/2021 12:24 PM  Cloy Wedeking  has presented today for surgery, with the diagnosis of BLADDER MASS.  The various methods of treatment have been discussed with the patient and family. After consideration of risks, benefits and other options for treatment, the patient has consented to  Procedure(s) with comments: CYSTOSCOPY WITH POSSIBLE BIOPSY (N/A) - 75 MINS as a surgical intervention.  The patient's history has been reviewed, patient examined, no change in status, stable for surgery.  I have reviewed the patient's chart and labs.  Questions were answered to the patient's satisfaction.     Burhan Barham D Wray Goehring

## 2021-05-23 NOTE — Op Note (Signed)
PATIENT:  Dan Salazar  PRE-OPERATIVE DIAGNOSIS: Bladder tumor  POST-OPERATIVE DIAGNOSIS: Same  PROCEDURE:  Procedure(s): 1. Cystoscopy, bladder biopsy, fulguration  SURGEON:  Jacalyn Lefevre, MD  ANESTHESIA:   General  EBL:  Minimal  DRAINS: none  SPECIMEN:  1. Trigone bladder biopsy  DISPOSITION OF SPECIMEN:  PATHOLOGY  FINDINGS: Distal shaft hypospadias Bilateral prostatic hypertrophy, no intravesical median lobe Severe bladder trabeculations Bilateral orthotropic ureteral orifices Small, round bladder lesion on trigone (<1 cm)  Indication:  60 yo man with prostate cancer found to have bladder growth during cystosocpy.    Description of operation: The patient was taken to the operating room and administered general anesthesia. They were then placed on the table and moved to the dorsal lithotomy position after which the genitalia was sterilely prepped and draped. An official timeout was then performed.  Sounds were used to dilate patient's urethra from 20 [place] to 26 [place].  Next the 23 French rigid cystoscope was placed in the urethral opening noted to be at the distal shaft.  Is advanced in the bladder under direct visualization.  Findings are noted above.  The previously seen bladder lesion was again noted at the trigone.  Patient was also noted to have severe bladder trabeculations.    A cold cup bladder biopsy was then taken of the trigone lesion.  Fulguration took place with Bugbee.  Hemostasis was deemed adequate with irrigant turned off.  Reinspection of the bladder revealed no other bladder abnormalities.  The patient's bladder was decompressed.  The cystoscope was removed.  The patient was awakened and taken to the recovery room.   PLAN OF CARE: Discharge to home after PACU  PATIENT DISPOSITION:  PACU - hemodynamically stable.

## 2021-05-23 NOTE — Anesthesia Procedure Notes (Signed)
Procedure Name: LMA Insertion Date/Time: 05/23/2021 1:28 PM Performed by: Gean Maidens, CRNA Pre-anesthesia Checklist: Patient identified, Emergency Drugs available, Suction available, Patient being monitored and Timeout performed Patient Re-evaluated:Patient Re-evaluated prior to induction Oxygen Delivery Method: Circle system utilized Preoxygenation: Pre-oxygenation with 100% oxygen Induction Type: IV induction Ventilation: Mask ventilation without difficulty LMA: LMA inserted LMA Size: 4.0 Number of attempts: 1 Placement Confirmation: positive ETCO2 and breath sounds checked- equal and bilateral Tube secured with: Tape Dental Injury: Teeth and Oropharynx as per pre-operative assessment

## 2021-05-23 NOTE — Anesthesia Preprocedure Evaluation (Addendum)
Anesthesia Evaluation  Patient identified by MRN, date of birth, ID band Patient awake    Reviewed: Allergy & Precautions, NPO status , Patient's Chart, lab work & pertinent test results  Airway Mallampati: II  TM Distance: >3 FB Neck ROM: Full    Dental  (+) Edentulous Upper, Partial Lower, Dental Advisory Given, Poor Dentition,    Pulmonary shortness of breath and with exertion, pneumonia, COPD (Severe by PFT 02/2020),  COPD inhaler,    Pulmonary exam normal breath sounds clear to auscultation       Cardiovascular hypertension (hx HTN, has been off meds since 2020), Normal cardiovascular exam Rhythm:Regular Rate:Normal  Echo 2019: - Left ventricle: The cavity size was normal. There was mild concentric hypertrophy. Systolic function was vigorous. The estimated ejection fraction was in the range of 65% to 70%. Wall motion was normal; there were no regional wall motion abnormalities. Left ventricular diastolic function parameters were normal.  - Aortic valve: Trileaflet; mildly thickened, mildly calcified  leaflets.  - Aorta: Aortic root dimension: 39 mm (ED).  - Aortic root: The aortic root was mildly dilated.  - Left atrium: The atrium was mildly dilated.  - Right atrium: The atrium was mildly dilated.  - Atrial septum: There was increased thickness of the septum, consistent with lipomatous hypertrophy.  - Pulmonary arteries: PA peak pressure: 38 mm Hg (S).    Neuro/Psych PSYCHIATRIC DISORDERS Anxiety Dementia  Neuromuscular disease (RLS)    GI/Hepatic negative GI ROS, (+)     substance abuse  marijuana use,   Endo/Other  negative endocrine ROS  Renal/GU Renal InsufficiencyRenal disease (cr 1.33)   Prostate ca     Musculoskeletal negative musculoskeletal ROS (+)   Abdominal   Peds  Hematology  (+) Blood dyscrasia, anemia , hct 49.3   Anesthesia Other Findings   Reproductive/Obstetrics negative OB ROS                            Anesthesia Physical Anesthesia Plan  ASA: 3  Anesthesia Plan: General   Post-op Pain Management:    Induction: Intravenous  PONV Risk Score and Plan: 3 and Ondansetron, Dexamethasone, Treatment may vary due to age or medical condition and Midazolam  Airway Management Planned: LMA  Additional Equipment: None  Intra-op Plan:   Post-operative Plan: Extubation in OR  Informed Consent: I have reviewed the patients History and Physical, chart, labs and discussed the procedure including the risks, benefits and alternatives for the proposed anesthesia with the patient or authorized representative who has indicated his/her understanding and acceptance.     Dental advisory given  Plan Discussed with: CRNA  Anesthesia Plan Comments:       Anesthesia Quick Evaluation

## 2021-05-23 NOTE — Discharge Instructions (Signed)
Transurethral Resection of Bladder Tumor (TURBT)   Definition:  Transurethral Resection of the Bladder Tumor is a surgical procedure used to diagnose and remove tumors within the bladder. TURBT is the most common treatment for early stage bladder cancer.  General instructions:     Your recent bladder surgery requires very little post hospital care but some definite precautions.  Despite the fact that no skin incisions were used, the area around the bladder incisions are raw and covered with scabs to promote healing and prevent bleeding. Certain precautions are needed to insure that the scabs are not disturbed over the next 2-4 weeks while the healing proceeds.  Because the raw surface inside your bladder and the irritating effects of urine you may expect frequency of urination and/or urgency (a stronger desire to urinate) and perhaps even getting up at night more often. This will usually resolve or improve slowly over the healing period. You may see some blood in your urine over the first 6 weeks. Do not be alarmed, even if the urine was clear for a while. Get off your feet and drink lots of fluids until clearing occurs. If you start to pass clots or don't improve call us.   Diet:  You may return to your normal diet immediately. Because of the raw surface of your bladder, alcohol, spicy foods, foods high in acid and drinks with caffeine may cause irritation or frequency and should be used in moderation. To keep your urine flowing freely and avoid constipation, drink plenty of fluids during the day (8-10 glasses). Tip: Avoid cranberry juice because it is very acidic.  Activity:  Your physical activity doesn't need to be restricted. However, if you are very active, you may see some blood in the urine. We suggest that you reduce your activity under the circumstances until the bleeding has stopped.  Bowels:  It is important to keep your bowels regular during the postoperative period. Straining  with bowel movements can cause bleeding. A bowel movement every other day is reasonable. Use a mild laxative if needed, such as milk of magnesia 2-3 tablespoons, or 2 Dulcolax tablets. Call if you continue to have problems. If you had been taking narcotics for pain, before, during or after your surgery, you may be constipated. Take a laxative if necessary.    Medication:  You should resume your pre-surgery medications unless told not to. In addition you may be given an antibiotic to prevent or treat infection. Antibiotics are not always necessary. All medication should be taken as prescribed until the bottles are finished unless you are having an unusual reaction to one of the drugs.

## 2021-05-23 NOTE — Transfer of Care (Signed)
Immediate Anesthesia Transfer of Care Note  Patient: Dan Salazar  Procedure(s) Performed: CYSTOSCOPY WITH BLADDER BIOPSY WITH FULGURATION  Patient Location: PACU  Anesthesia Type:General  Level of Consciousness: unresponsive, patient cooperative and responds to stimulation  Airway & Oxygen Therapy: Patient Spontanous Breathing and Patient connected to face mask oxygen  Post-op Assessment: Report given to RN and Post -op Vital signs reviewed and stable  Post vital signs: Reviewed and stable  Last Vitals:  Vitals Value Taken Time  BP 106/71 05/23/21 1345  Temp 36.4 C 05/23/21 1345  Pulse 50 05/23/21 1349  Resp 11 05/23/21 1349  SpO2 100 % 05/23/21 1349  Vitals shown include unvalidated device data.  Last Pain:  Vitals:   05/23/21 1345  TempSrc:   PainSc: Asleep         Complications: No notable events documented.

## 2021-05-24 ENCOUNTER — Encounter (HOSPITAL_COMMUNITY): Payer: Self-pay | Admitting: Urology

## 2021-05-24 LAB — SURGICAL PATHOLOGY

## 2021-05-24 NOTE — Anesthesia Postprocedure Evaluation (Signed)
Anesthesia Post Note  Patient: Dan Salazar, Dan Salazar  Procedure(s) Performed: CYSTOSCOPY WITH BLADDER BIOPSY WITH FULGURATION     Patient location during evaluation: PACU Anesthesia Type: General Level of consciousness: sedated and patient cooperative Pain management: pain level controlled Vital Signs Assessment: post-procedure vital signs reviewed and stable Respiratory status: spontaneous breathing Cardiovascular status: stable Anesthetic complications: no   No notable events documented.  Last Vitals:  Vitals:   05/23/21 1430 05/23/21 1445  BP: (!) 123/92 116/69  Pulse: 70   Resp: 16   Temp: 36.6 C   SpO2: 98%     Last Pain:  Vitals:   05/23/21 1430  TempSrc:   PainSc: 0-No pain                 Nolon Nations

## 2021-06-12 ENCOUNTER — Other Ambulatory Visit: Payer: Self-pay

## 2021-06-12 ENCOUNTER — Ambulatory Visit: Payer: Medicaid Other | Admitting: Physician Assistant

## 2021-06-12 ENCOUNTER — Encounter: Payer: Self-pay | Admitting: Physician Assistant

## 2021-06-12 VITALS — BP 126/77 | HR 74 | Resp 18 | Ht 74.0 in | Wt 198.0 lb

## 2021-06-12 DIAGNOSIS — R569 Unspecified convulsions: Secondary | ICD-10-CM

## 2021-06-12 DIAGNOSIS — F0390 Unspecified dementia without behavioral disturbance: Secondary | ICD-10-CM

## 2021-06-12 NOTE — Patient Instructions (Signed)
It was a pleasure to see you today at our office.   Recommendations:  Follow up in 6  months Increase activities outside of home EEG to rule out any seizure activity    RECOMMENDATIONS FOR ALL PATIENTS WITH MEMORY PROBLEMS: 1. Continue to exercise (Recommend 30 minutes of walking everyday, or 3 hours every week) 2. Increase social interactions - continue going to [place] and enjoy social gatherings with friends and family 3. Eat healthy, avoid fried foods and eat more fruits and vegetables 4. Maintain adequate blood pressure, blood sugar, and blood cholesterol level. Reducing the risk of stroke and cardiovascular disease also helps promoting better memory. 5. Avoid stressful situations. Live a simple life and avoid aggravations. Organize your time and prepare for the next day in anticipation. 6. Sleep well, avoid any interruptions of sleep and avoid any distractions in the bedroom that may interfere with adequate sleep quality 7. Avoid sugar, avoid sweets as there is a strong link between excessive sugar intake, diabetes, and cognitive impairment We discussed the Mediterranean diet, which has been shown to help patients reduce the risk of progressive memory disorders and reduces cardiovascular risk. This includes eating fish, eat fruits and green leafy vegetables, nuts like almonds and hazelnuts, walnuts, and also use olive oil. Avoid fast foods and fried foods as much as possible. Avoid sweets and sugar as sugar use has been linked to worsening of memory function.  There is always a concern of gradual progression of memory problems. If this is the case, then we may need to adjust level of care according to patient needs. Support, both to the patient and caregiver, should then be put into place.    FALL PRECAUTIONS: Be cautious when walking. Scan the area for obstacles that may increase the risk of trips and falls. When getting up in the mornings, sit up at the edge of the bed for a few minutes  before getting out of bed. Consider elevating the bed at the head end to avoid drop of blood pressure when getting up. Walk always in a well-lit room (use night lights in the walls). Avoid area rugs or power cords from appliances in the middle of the walkways. Use a walker or a cane if necessary and consider physical therapy for balance exercise. Get your eyesight checked regularly.  FINANCIAL OVERSIGHT: Supervision, especially oversight when making financial decisions or transactions is also recommended.  HOME SAFETY: Consider the safety of the kitchen when operating appliances like stoves, microwave oven, and blender. Consider having supervision and share cooking responsibilities until no longer able to participate in those. Accidents with firearms and other hazards in the house should be identified and addressed as well.   ABILITY TO BE LEFT ALONE: If patient is unable to contact 911 operator, consider using LifeLine, or when the need is there, arrange for someone to stay with patients. Smoking is a fire hazard, consider supervision or cessation. Risk of wandering should be assessed by caregiver and if detected at any point, supervision and safe proof recommendations should be instituted.  MEDICATION SUPERVISION: Inability to self-administer medication needs to be constantly addressed. Implement a mechanism to ensure safe administration of the medications.   DRIVING: Regarding driving, in patients with progressive memory problems, driving will be impaired. We advise to have someone else do the driving if trouble finding directions or if minor accidents are reported. Independent driving assessment is available to determine safety of driving.   If you are interested in the driving assessment, you can  contact the following:  [employer] in [place]  [place] [phone]  [place]  [facility] [phone] or  [phone]

## 2021-06-12 NOTE — Progress Notes (Signed)
Assessment/Plan:   Dan Salazar is a 60 y.o. year old male with risk factors including  age,   with deficiencies in visuospatial/executive, memory, attention, language,  delayed recall  3/5, orientation  0/6 .    Recommendations:   Early Onset Dementia with Behavioral Disturbance   MRI brain with/without contrast to assess for underlying structural abnormality and assess vascular load  Check B12 Start Donepezil Take half tablet (5 mg) daily for 2 weeks, then increase to the full tablet at 10 mg daily.  Side Effects discussed  Discussed safety both in and out of the home. Recommend advanced care planning and long term planning as he will eventually need 24/7 supervision.  Discussed the importance of regular daily schedule to maintain brain function.  Continue to monitor mood with PCP.  Stay active at least 30 minutes at least 3 times a week.  Naps should be scheduled and should be no longer than 60 minutes and should not occur after 2 PM.  Folllow up in 2-3 months  Subjective:   60 y.o. year old RH  male hypertension, prostate cancer sp seed implant, COPD with chronic bronchitis, hyperlipidemia and prostate cancer seen today for evaluation of memory loss. Carries a diagnosis of Early Onset Dementia with Behavioral Disturbance from a prior evaluation in 09/2018, lost to follow up. Donepezil 10 mg was prescribed but he never took the medicine. MoCA at the time was 20/30.  Last MoCA in May of this year was 16/30 .  Since his last visit, the patient is doing well, he has gained 28 pounds, and his overall stamina is better since was seen priorly.  He is tolerating donepezil well, his friend told me states that his memory is "stabilized, he has more clear thoughts ".  He continues to repeat some stories or asked the same questions, but is less frequent.  He continues to live alone with home monitoring.  His mood is more stable, without depression or irritability.  He is very active at church  events, and spends time with his best friend [place].  He does not walk frequently, but does activities outdoors.  He sleeps well, denies sleepwalking, vivid dreams, hallucinations or paranoia.  Denies leaving objects in unusual places.  He continues to hate taking showers, and likes to dress with the same outfit.  He denies trouble swallowing, he no longer cooks.  He denies any recent falls or head injuries, he began to drive again very short distances, with more confidence.  He denies any headaches, double vision, dizziness, focal numbness or tingling, unilateral weakness or tremors.  He has mild urine incontinence followed by urology.  He had cystoscopy recently, with negative work-up.  Denies constipation or diarrhea,or anosmia Mild urine incontinence,followed by urology. Had cystoscopy with negative workup  Denies constipation or diarrhea. Denies anosmia. No ETOH Tobacco.    History of Present Illness on 11/13/2018: This is a pleasant 60 year old right-handed man with a history of hypertension, hyperlipidemia, presenting for evaluation of dementia. He reports his memory "ain't as good as it used to be." He reports symptoms started a year ago, however his POA Dan Salazar reports that symptoms started in 2017. 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 In 2017, they started noticing that he would be sent for a job on [place] but he would end up in [place]. This happened 2-3 times, or  they would get calls from a customer that he had no idea what he was doing there. Dan Salazar and his church started helping him with bills, they realized some bills were way past due date. They started a disability process for him in 2017 which was granted after SS assessment in 2018. Dan Salazar states he was not diagnosed with dementia, but by a "long medical term that means wrinkling of the brain." Dan Salazar reports he has not  been evaluated by a neurologist and as far as he know, no brain imaging has been done. He was admitted for pneumonia complicated by pleural effusion and lung collapse in 2019. Dan Salazar reports that they were told that his nutritional status contributed to this, he was not eating correctly. Church and [place] started helping him with nutrition. He stopped driving in 4650. He manages his own medications, Dan Salazar tries to check behind him and call to remind him. He endorses a lot of word-finding difficulties. He is able to bathe and dress independently, no hygiene concerns per Dan Salazar, he does his own laundry. He reports his mood is pretty good, no personality changes, paranoia or hallucinations. As far as Dan Salazar know, no wandering behavior at night. He reports sleep is sometimes better than other times, no daytime drowsiness. He lives alone in a [place] house. He is estranged from his daughter and grandson who live in [place], his mother lives in [place].    He has occasional horizontal diplopia and mild back pain. Otherwise he denies any headaches, dizziness, dysarthria/dysphagia, neck pain, focal numbness/tingling/weakness, bowel/bladder dysfunction, anosmia, or tremors. His mother has memory loss. He denies any significant head injuries. He drinks beer occasionally.      Labs 01/1721 TSH 1.404  EKG QT 354, QTC 452, SR with possible LAE  Allergies  Allergen Reactions   Hctz [Hydrochlorothiazide] Other (See Comments)    Severe electrolyte abnormalities    Current Outpatient Medications  Medication Instructions   acetaminophen (TYLENOL) 1,000 mg, Oral, Every 6 hours PRN   budesonide-formoterol (SYMBICORT) 160-4.5 MCG/ACT inhaler 2 puffs, Inhalation, 2 times daily   dicyclomine (BENTYL) 20 mg, Oral, 2 times daily   donepezil (ARICEPT) 10 MG tablet Take half tablet (5 mg) daily for 2 weeks, then increase to the full tablet at 10 mg daily   tamsulosin (FLOMAX) 0.4 mg, Oral, Daily at bedtime   vitamin B-12  (CYANOCOBALAMIN) 1,000 mcg, Oral, 2 times daily     VITALS:   Vitals:   06/12/21 1428  BP: 126/77  Pulse: 74  Resp: 18  SpO2: 97%  Weight: 198 lb (89.8 kg)  Height: 6\' 2"  (1.88 m)    No flowsheet data found.  PHYSICAL EXAM   HEENT:Disheveled appearance   Normocephalic, atraumatic.temporal wasting and sunken eyes noted.  The mucous membranes are moist. The superficial temporal arteries are without ropiness or tenderness. Cardiovascular: Regular rate and rhythm. Lungs: Clear to auscultation bilaterally. Neck: There are no carotid bruits noted bilaterally.  NEUROLOGICAL: Montreal Cognitive Assessment  04/12/2021 11/12/2018  Visuospatial/ Executive (0/5) 0 4  Naming (0/3) 3 3  Attention: Read list of digits (0/2) 2 1  Attention: Read list of letters (0/1) 1 1  Attention: Serial 7 subtraction starting at 100 (0/3) 1 2  Language: Repeat phrase (0/2) 2 2  Language : Fluency (0/1) 0 0  Abstraction (0/2) 2 1  Delayed Recall (0/5) 3 0  Orientation (0/6) 2 5  Total 16 19  Adjusted Score (based on education) 16 20   No flowsheet data found.  No flowsheet data found.   Orientation:  Alert and oriented to person,  not to place and time. No aphasia or dysarthria. Fund of knowledge is reduced. Recent and remote memory impaired  Attention and concentration are normal.  Able to name objects and repeat phrases  Cranial nerves: There is good facial symmetry. Extraocular muscles are intact and visual fields are full to confrontational testing. "Sunken eyes". . Speech is  not fluent  but clear. Soft palate rises symmetrically and there is no tongue deviation. Hearing is intact to conversational tone. Tone: Tone is good throughout. Sensation: Sensation is intact to light touch and pinprick throughout. Vibration is intact at the bilateral big toe.There is no extinction with double simultaneous stimulation. There is no sensory dermatomal level identified. Coordination: The patient has no  difficulty with RAM's or FNF bilaterally. Normal finger to nose  Motor: Strength is 5/5 in the bilateral upper and lower extremities. There is no pronator drift. There are no fasciculations noted. DTR's: Deep tendon reflexes are 2/4 at the bilateral biceps, triceps, brachioradialis, patella and achilles.  Plantar responses are downgoing bilaterally. Gait and Station: The patient is able to ambulate without difficulty.The patient is able to heel toe walk without any difficulty.The patient is able to ambulate in a tandem fashion. The patient is able to stand in the Romberg position.     Thank you for allowing [place] the opportunity to participate in the care of this nice patient. Please do not hesitate to contact us for any questions or concerns.   Total time spent on today's visit was 30 minutes, including both face-to-face time and nonface-to-face time.  Time included that spent on review of records (prior notes available to me/labs/imaging if pertinent), discussing treatment and goals, answering patient's questions and coordinating care.  Cc:  Mardi Mainland, FNP  Sharene Butters 06/12/2021 2:44 PM

## 2021-12-11 ENCOUNTER — Ambulatory Visit: Payer: Medicaid Other | Admitting: Physician Assistant

## 2024-03-10 ENCOUNTER — Emergency Department (HOSPITAL_COMMUNITY)

## 2024-03-10 ENCOUNTER — Inpatient Hospital Stay (HOSPITAL_COMMUNITY)

## 2024-03-10 ENCOUNTER — Encounter (HOSPITAL_COMMUNITY): Payer: Self-pay

## 2024-03-10 ENCOUNTER — Inpatient Hospital Stay (HOSPITAL_COMMUNITY)
Admission: EM | Admit: 2024-03-10 | Discharge: 2024-03-17 | DRG: 884 | Disposition: A | Discharge status: Skilled Nursing Facility | Attending: Family Medicine | Admitting: Family Medicine

## 2024-03-10 DIAGNOSIS — F1290 Cannabis use, unspecified, uncomplicated: Secondary | ICD-10-CM | POA: Diagnosis present

## 2024-03-10 DIAGNOSIS — R4182 Altered mental status, unspecified: Secondary | ICD-10-CM | POA: Diagnosis not present

## 2024-03-10 DIAGNOSIS — Z8673 Personal history of transient ischemic attack (TIA), and cerebral infarction without residual deficits: Secondary | ICD-10-CM

## 2024-03-10 DIAGNOSIS — Z8546 Personal history of malignant neoplasm of prostate: Secondary | ICD-10-CM | POA: Diagnosis not present

## 2024-03-10 DIAGNOSIS — Z7951 Long term (current) use of inhaled steroids: Secondary | ICD-10-CM | POA: Diagnosis not present

## 2024-03-10 DIAGNOSIS — I428 Other cardiomyopathies: Secondary | ICD-10-CM | POA: Diagnosis present

## 2024-03-10 DIAGNOSIS — R0609 Other forms of dyspnea: Secondary | ICD-10-CM | POA: Diagnosis not present

## 2024-03-10 DIAGNOSIS — Z888 Allergy status to other drugs, medicaments and biological substances status: Secondary | ICD-10-CM

## 2024-03-10 DIAGNOSIS — N189 Chronic kidney disease, unspecified: Secondary | ICD-10-CM | POA: Diagnosis not present

## 2024-03-10 DIAGNOSIS — F0154 Vascular dementia, unspecified severity, with anxiety: Secondary | ICD-10-CM | POA: Diagnosis present

## 2024-03-10 DIAGNOSIS — Z8659 Personal history of other mental and behavioral disorders: Secondary | ICD-10-CM

## 2024-03-10 DIAGNOSIS — Z6820 Body mass index (BMI) 20.0-20.9, adult: Secondary | ICD-10-CM

## 2024-03-10 DIAGNOSIS — E860 Dehydration: Secondary | ICD-10-CM | POA: Diagnosis present

## 2024-03-10 DIAGNOSIS — R627 Adult failure to thrive: Secondary | ICD-10-CM | POA: Diagnosis present

## 2024-03-10 DIAGNOSIS — I130 Hypertensive heart and chronic kidney disease with heart failure and stage 1 through stage 4 chronic kidney disease, or unspecified chronic kidney disease: Secondary | ICD-10-CM | POA: Diagnosis present

## 2024-03-10 DIAGNOSIS — R000 Tachycardia, unspecified: Secondary | ICD-10-CM | POA: Diagnosis present

## 2024-03-10 DIAGNOSIS — Z79899 Other long term (current) drug therapy: Secondary | ICD-10-CM | POA: Diagnosis not present

## 2024-03-10 DIAGNOSIS — E785 Hyperlipidemia, unspecified: Secondary | ICD-10-CM | POA: Diagnosis present

## 2024-03-10 DIAGNOSIS — L89152 Pressure ulcer of sacral region, stage 2: Secondary | ICD-10-CM | POA: Diagnosis present

## 2024-03-10 DIAGNOSIS — G2581 Restless legs syndrome: Secondary | ICD-10-CM | POA: Diagnosis present

## 2024-03-10 DIAGNOSIS — E46 Unspecified protein-calorie malnutrition: Secondary | ICD-10-CM | POA: Insufficient documentation

## 2024-03-10 DIAGNOSIS — R059 Cough, unspecified: Secondary | ICD-10-CM

## 2024-03-10 DIAGNOSIS — Z801 Family history of malignant neoplasm of trachea, bronchus and lung: Secondary | ICD-10-CM | POA: Diagnosis not present

## 2024-03-10 DIAGNOSIS — N1832 Chronic kidney disease, stage 3b: Secondary | ICD-10-CM | POA: Diagnosis present

## 2024-03-10 DIAGNOSIS — I2510 Atherosclerotic heart disease of native coronary artery without angina pectoris: Secondary | ICD-10-CM | POA: Diagnosis present

## 2024-03-10 DIAGNOSIS — I5023 Acute on chronic systolic (congestive) heart failure: Secondary | ICD-10-CM | POA: Diagnosis not present

## 2024-03-10 DIAGNOSIS — I5022 Chronic systolic (congestive) heart failure: Secondary | ICD-10-CM | POA: Diagnosis present

## 2024-03-10 DIAGNOSIS — Z7982 Long term (current) use of aspirin: Secondary | ICD-10-CM

## 2024-03-10 DIAGNOSIS — J449 Chronic obstructive pulmonary disease, unspecified: Secondary | ICD-10-CM | POA: Diagnosis present

## 2024-03-10 DIAGNOSIS — E43 Unspecified severe protein-calorie malnutrition: Secondary | ICD-10-CM | POA: Diagnosis present

## 2024-03-10 DIAGNOSIS — Z66 Do not resuscitate: Secondary | ICD-10-CM | POA: Diagnosis present

## 2024-03-10 DIAGNOSIS — R531 Weakness: Principal | ICD-10-CM

## 2024-03-10 DIAGNOSIS — I509 Heart failure, unspecified: Secondary | ICD-10-CM

## 2024-03-10 DIAGNOSIS — N400 Enlarged prostate without lower urinary tract symptoms: Secondary | ICD-10-CM | POA: Diagnosis present

## 2024-03-10 DIAGNOSIS — R058 Other specified cough: Secondary | ICD-10-CM

## 2024-03-10 DIAGNOSIS — Z751 Person awaiting admission to adequate facility elsewhere: Secondary | ICD-10-CM

## 2024-03-10 DIAGNOSIS — Z789 Other specified health status: Secondary | ICD-10-CM

## 2024-03-10 LAB — COMPREHENSIVE METABOLIC PANEL WITH GFR
ALT: 21 U/L (ref 0–44)
AST: 18 U/L (ref 15–41)
Albumin: 3.8 g/dL (ref 3.5–5.0)
Alkaline Phosphatase: 61 U/L (ref 38–126)
Anion gap: 20 — ABNORMAL HIGH (ref 5–15)
BUN: 30 mg/dL — ABNORMAL HIGH (ref 8–23)
CO2: 17 mmol/L — ABNORMAL LOW (ref 22–32)
Calcium: 9.2 mg/dL (ref 8.9–10.3)
Chloride: 95 mmol/L — ABNORMAL LOW (ref 98–111)
Creatinine, Ser: 1.76 mg/dL — ABNORMAL HIGH (ref 0.61–1.24)
GFR, Estimated: 42 mL/min — ABNORMAL LOW (ref >60)
Glucose, Bld: 102 mg/dL — ABNORMAL HIGH (ref 70–99)
Potassium: 4.5 mmol/L (ref 3.5–5.1)
Sodium: 132 mmol/L — ABNORMAL LOW (ref 135–145)
Total Bilirubin: 2.3 mg/dL — ABNORMAL HIGH (ref 0.0–1.2)
Total Protein: 6.7 g/dL (ref 6.5–8.1)

## 2024-03-10 LAB — BLOOD GAS, VENOUS
Acid-Base Excess: 5.5 mmol/L — ABNORMAL HIGH (ref 0.0–2.0)
Bicarbonate: 31.1 mmol/L — ABNORMAL HIGH (ref 20.0–28.0)
O2 Saturation: 31.6 %
Patient temperature: 36.2
pCO2, Ven: 46 mmHg (ref 44–60)
pH, Ven: 7.43 (ref 7.25–7.43)
pO2, Ven: <31 mmHg — CL (ref 32–45)

## 2024-03-10 LAB — TSH: TSH: 1.126 u[IU]/mL (ref 0.350–4.500)

## 2024-03-10 LAB — URINALYSIS, ROUTINE W REFLEX MICROSCOPIC
Bilirubin Urine: NEGATIVE
Glucose, UA: NEGATIVE mg/dL
Hgb urine dipstick: NEGATIVE
Ketones, ur: 20 mg/dL — AB
Leukocytes,Ua: NEGATIVE
Nitrite: NEGATIVE
Protein, ur: NEGATIVE mg/dL
Specific Gravity, Urine: 1.02 (ref 1.005–1.030)
pH: 5 (ref 5.0–8.0)

## 2024-03-10 LAB — RESP PANEL BY RT-PCR (RSV, FLU A&B, COVID)  RVPGX2
Influenza A by PCR: NEGATIVE
Influenza B by PCR: NEGATIVE
Resp Syncytial Virus by PCR: NEGATIVE
SARS Coronavirus 2 by RT PCR: NEGATIVE

## 2024-03-10 LAB — CBC
HCT: 47.2 % (ref 39.0–52.0)
Hemoglobin: 16.8 g/dL (ref 13.0–17.0)
MCH: 35.1 pg — ABNORMAL HIGH (ref 26.0–34.0)
MCHC: 35.6 g/dL (ref 30.0–36.0)
MCV: 98.7 fL (ref 80.0–100.0)
Platelets: 150 K/uL (ref 150–400)
RBC: 4.78 MIL/uL (ref 4.22–5.81)
RDW: 12.8 % (ref 11.5–15.5)
WBC: 11.1 K/uL — ABNORMAL HIGH (ref 4.0–10.5)
nRBC: 0 % (ref 0.0–0.2)

## 2024-03-10 LAB — HIV ANTIBODY (ROUTINE TESTING W REFLEX): HIV Screen 4th Generation wRfx: NONREACTIVE

## 2024-03-10 LAB — ETHANOL: Alcohol, Ethyl (B): <15 mg/dL (ref <15)

## 2024-03-10 LAB — TROPONIN I (HIGH SENSITIVITY): Troponin I (High Sensitivity): 11 ng/L (ref <18)

## 2024-03-10 MED ORDER — IPRATROPIUM-ALBUTEROL 0.5-2.5 (3) MG/3ML IN SOLN: 3.0000 mL | RESPIRATORY_TRACT | Status: DC | PRN

## 2024-03-10 MED ORDER — THIAMINE MONONITRATE 100 MG PO TABS
100.0000 mg | ORAL_TABLET | Freq: Every day | ORAL | Status: DC
  Administered 2024-03-10 – 2024-03-17 (×10): 100 mg via ORAL
  Filled 2024-03-10 (×8): qty 1

## 2024-03-10 MED ORDER — GERHARDT'S BUTT CREAM
TOPICAL_CREAM | Freq: Two times a day (BID) | CUTANEOUS | Status: DC
  Filled 2024-03-10 (×3): qty 60

## 2024-03-10 MED ORDER — LACTATED RINGERS IV BOLUS
500.0000 mL | Freq: Once | INTRAVENOUS | Status: AC
  Administered 2024-03-10 (×2): 500 mL via INTRAVENOUS

## 2024-03-10 MED ORDER — ENOXAPARIN SODIUM 30 MG/0.3ML IJ SOSY
30.0000 mg | PREFILLED_SYRINGE | INTRAMUSCULAR | Status: DC
  Administered 2024-03-10 – 2024-03-11 (×4): 30 mg via SUBCUTANEOUS
  Filled 2024-03-10 (×2): qty 0.3

## 2024-03-10 MED ORDER — TAMSULOSIN HCL 0.4 MG PO CAPS
0.4000 mg | ORAL_CAPSULE | Freq: Every day | ORAL | Status: DC
  Administered 2024-03-10 – 2024-03-16 (×9): 0.4 mg via ORAL
  Filled 2024-03-10 (×7): qty 1

## 2024-03-10 MED ORDER — FOLIC ACID 1 MG PO TABS
1.0000 mg | ORAL_TABLET | Freq: Every day | ORAL | Status: DC
  Administered 2024-03-10 – 2024-03-17 (×10): 1 mg via ORAL
  Filled 2024-03-10 (×8): qty 1

## 2024-03-10 MED ORDER — HYDROMORPHONE HCL 1 MG/ML IJ SOLN
1.0000 mg | Freq: Once | INTRAMUSCULAR | Status: AC
  Administered 2024-03-10 (×2): 1 mg via INTRAVENOUS
  Filled 2024-03-10: qty 1

## 2024-03-10 MED ORDER — ADULT MULTIVITAMIN W/MINERALS CH
1.0000 | ORAL_TABLET | Freq: Every day | ORAL | Status: DC
  Administered 2024-03-10 – 2024-03-17 (×10): 1 via ORAL
  Filled 2024-03-10 (×8): qty 1

## 2024-03-10 MED ORDER — LACTATED RINGERS IV BOLUS
1000.0000 mL | Freq: Once | INTRAVENOUS | Status: AC
  Administered 2024-03-10 (×2): 1000 mL via INTRAVENOUS

## 2024-03-10 NOTE — ED Provider Notes (Signed)
 Overall lab work concerning for dehydration.  Possibly concerning for polypharmacy may be stroke.  Will admit to medicine for further care MRI hydration.  Urinalysis was negative for infection.  However he has been off his baseline for the last week or 2.  He has history of dementia lives by himself.  I do not think it is safe for him to go home by himself.  I think he likely needs placement PT OT further neurologic and metabolic workup.  Patient to be admitted to medicine service for further care.  This chart was dictated using voice recognition software.  Despite best efforts to proofread,  errors can occur which can change the documentation meaning.    Ruthe Cornet, DO 03/10/24 1721

## 2024-03-10 NOTE — Assessment & Plan Note (Signed)
 Differential as above.  CT head without acute pathology, extensive cerebral white matter disease present with chronic infarcts throughout.  Passed bedside swallow. - Admit to FMTS, med-tele, attending Dr. Donzetta - Follow-up MRI - UDS, ethanol, TSH - Delirium/fall precautions - PT/OT

## 2024-03-10 NOTE — Discharge Instructions (Addendum)
 It was our pleasure to provide your ER care today - we hope that you feel better.  Drink plenty of fluids/stay well hydrated.   Follow up closely with primary care doctor in the coming week.  Return to ER if worse, new symptoms, fevers, trouble breathing, chest pain, or other concern.

## 2024-03-10 NOTE — Assessment & Plan Note (Signed)
 HTN-not on medications at home, trend blood pressures start medication if necessary Dementia-question vascular dementia given prior strokes, hold donepezil  at this time

## 2024-03-10 NOTE — Plan of Care (Addendum)
 FMTS Interim Progress Note  S: Went with Dr. Cleotilde to evaluate patient at bedside. Patient was pleasant, however animated and only oriented to himself. Patient is unsure when his last BM was, but he says it was a while ago. His neighbor was at bedside and says he has been having loose stool. We discussed the results of the MRI performed today shows some progression of pts vascular dementia, and awaiting results to r/o other causes of AMS.  O: BP (!) 142/99   Pulse (!) 57   Temp (!) 97.2 F (36.2 C) (Axillary)   Resp 12   SpO2 100%   General: disheveled, laying in bed, NAD Cardiac: tachycardiac, no r/m/g Respiratory: normal work of breathing on RA, new onset wet cough however CTAB Abd: non distended, non tender to light or deep palpation Extremities: multiple excoriations on bilat. UE  A/P: AMS: Still exhibiting bizarre behavior and only oriented to self. We will perform bladder scan to make sure he is not retaining urine and XR abd to r/o stool burden causing AMS.  -f/u bladder scan  - f/u UDS -XR abdomen -delirium precautions  Agitation overnight: Precaution for possible sundowning-> if not redirectable and believed to be a danger to himself or others - 1mg - 5mg  haldol IM PRN  Idelle Nakai, DO 03/10/2024, 8:33 PM PGY-1, [facility] pager [phone]

## 2024-03-10 NOTE — ED Provider Notes (Signed)
 [place] [facility] AT [facility] Provider Note   CSN: [id number] Arrival date & time: 03/10/24  1312     Patient presents with: Weakness and Altered Mental Status   Dan Salazar is a 63 y.o. male.   Pt with hx dementia, brought by friend who goes to check on patient - indicates patient seemed generally weak, more disheveled than normal, w non prod cough, and more cantankerous and less jovial than normal.  Pt limited historian - level 5 caveat - indicates is just hungry. Friend indicates pt lives independently, and has been able to drive self to grocery to get food, but had not seen patient in past couple weeks, so is not sure how regularly patient is getting out, eating, etc. No report of trauma/fall. No report of fevers.   The history is provided by the patient, medical records, a friend and the EMS personnel.  Weakness Associated symptoms: cough   Associated symptoms: no abdominal pain, no chest pain, no dysuria, no fever, no headaches, no shortness of breath and no vomiting   Altered Mental Status Associated symptoms: weakness   Associated symptoms: no abdominal pain, no fever, no headaches and no vomiting        Prior to Admission medications   Medication Sig Start Date End Date Taking? Authorizing Provider  acetaminophen  (TYLENOL ) 500 MG tablet Take 1,000 mg by mouth every 6 (six) hours as needed for mild pain.    [provider]  budesonide -formoterol  (SYMBICORT ) 160-4.5 MCG/ACT inhaler Inhale 2 puffs into the lungs 2 (two) times daily. Patient taking differently: Inhale 2 puffs into the lungs 2 (two) times daily as needed (wheezing/shortness of breath). 04/14/20   Neda Jennet LABOR, MD  dicyclomine  (BENTYL ) 20 MG tablet Take 1 tablet (20 mg total) by mouth 2 (two) times daily. 02/14/21   Neldon Hamp RAMAN, PA  donepezil  (ARICEPT ) 10 MG tablet Take half tablet (5 mg) daily for 2 weeks, then increase to the full tablet at 10 mg daily Patient taking  differently: Take 10 mg by mouth daily. 04/12/21   Wertman, Sara E, PA-C  tamsulosin  (FLOMAX ) 0.4 MG CAPS capsule Take 0.4 mg by mouth at bedtime. 04/13/21   [provider]  vitamin B-12 (CYANOCOBALAMIN ) 1000 MCG tablet Take 1,000 mcg by mouth in the morning and at bedtime.    [provider]    Allergies: Hctz [hydrochlorothiazide]    Review of Systems  Constitutional:  Negative for fever.  HENT:  Negative for sore throat and trouble swallowing.   Respiratory:  Positive for cough. Negative for shortness of breath.   Cardiovascular:  Negative for chest pain.  Gastrointestinal:  Negative for abdominal pain and vomiting.  Genitourinary:  Negative for dysuria.  Musculoskeletal:  Negative for neck pain and neck stiffness.  Neurological:  Positive for weakness. Negative for headaches.    Updated Vital Signs BP 137/86   Pulse 83   Temp (!) 97.5 F (36.4 C) (Oral)   Resp (!) 22   SpO2 97%   Physical Exam Vitals and nursing note reviewed.  Constitutional:      Appearance: Normal appearance. He is well-developed.  HENT:     Head: Atraumatic.     Comments: ~3 cm lesion left parietal scalp (friend indicates formerly evaluated and neg for malignancy).     Nose: Nose normal.     Mouth/Throat:     Mouth: Mucous membranes are moist.     Pharynx: Oropharynx is clear.  Eyes:  General: No scleral icterus.    Conjunctiva/sclera: Conjunctivae normal.     Pupils: Pupils are equal, round, and reactive to light.  Neck:     Vascular: No carotid bruit.     Trachea: No tracheal deviation.     Comments: Trachea midline, thyroid  not grossly enlarged or tender. No neck stiffness or rigidity.  Cardiovascular:     Rate and Rhythm: Normal rate and regular rhythm.     Pulses: Normal pulses.     Heart sounds: Normal heart sounds. No murmur heard.    No friction rub. No gallop.  Pulmonary:     Effort: Pulmonary effort is normal. No accessory muscle usage or respiratory distress.      Breath sounds: Normal breath sounds.  Abdominal:     General: Bowel sounds are normal. There is no distension.     Palpations: Abdomen is soft.     Tenderness: There is no abdominal tenderness.  Genitourinary:    Comments: No cva tenderness. Musculoskeletal:        General: No swelling.     Cervical back: Normal range of motion and neck supple. No rigidity.     Comments: CTLS spine, non tender, aligned, no step off. Good rom bil extremities without pain or focal bony tenderness.  Chronically avulsed left toe nail hanging on by tiny thread - removed, no sign of infection.  Sebaceous cyst to right chest, expressed material, no sign of infection to site.   Skin:    General: Skin is warm and dry.     Findings: No rash.     Comments: Subcutaneous lipoma mid to lower back (friend indicates is chronic).  Neurological:     Mental Status: He is alert.     Comments: Alert, speech clear. Motor/sens grossly intact bil.   Psychiatric:        Mood and Affect: Mood normal.     (all labs ordered are listed, but only abnormal results are displayed) Results for orders placed or performed during the hospital encounter of 03/10/24  Comprehensive metabolic panel   Collection Time: 03/10/24  1:26 PM  Result Value Ref Range   Sodium 132 (L) 135 - 145 mmol/L   Potassium 4.5 3.5 - 5.1 mmol/L   Chloride 95 (L) 98 - 111 mmol/L   CO2 17 (L) 22 - 32 mmol/L   Glucose, Bld 102 (H) 70 - 99 mg/dL   BUN 30 (H) 8 - 23 mg/dL   Creatinine, Ser 8.23 (H) 0.61 - 1.24 mg/dL   Calcium  9.2 8.9 - 10.3 mg/dL   Total Protein 6.7 6.5 - 8.1 g/dL   Albumin  3.8 3.5 - 5.0 g/dL   AST 18 15 - 41 U/L   ALT 21 0 - 44 U/L   Alkaline Phosphatase 61 38 - 126 U/L   Total Bilirubin 2.3 (H) 0.0 - 1.2 mg/dL   GFR, Estimated 42 (L) >60 mL/min   Anion gap 20 (H) 5 - 15  CBC   Collection Time: 03/10/24  1:26 PM  Result Value Ref Range   WBC 11.1 (H) 4.0 - 10.5 K/uL   RBC 4.78 4.22 - 5.81 MIL/uL   Hemoglobin 16.8 13.0 - 17.0  g/dL   HCT 52.7 60.9 - 47.9 %   MCV 98.7 80.0 - 100.0 fL   MCH 35.1 (H) 26.0 - 34.0 pg   MCHC 35.6 30.0 - 36.0 g/dL   RDW 87.1 88.4 - 84.4 %   Platelets 150 150 - 400 K/uL   nRBC  0.0 0.0 - 0.2 %      EKG: EKG Interpretation Date/Time:  Tuesday March 10 2024 13:30:01 EDT Ventricular Rate:  85 PR Interval:  49 QRS Duration:  122 QT Interval:  368 QTC Calculation: 438 R Axis:   58  Text Interpretation: Sinus rhythm Short PR interval Nonspecific intraventricular conduction delay Nonspecific T wave abnormality No significant change since last tracing Confirmed by Bernard Drivers ([id number]) on 03/10/2024 2:43:19 PM  Radiology: CT Head Wo Contrast Result Date: 03/10/2024 EXAM: CT HEAD WITHOUT CONTRAST 03/10/2024 02:10:04 PM TECHNIQUE: CT of the head was performed without the administration of intravenous contrast. Automated exposure control, iterative reconstruction, and/or weight based adjustment of the mA/kV was utilized to reduce the radiation dose to as low as reasonably achievable. COMPARISON: MRI of the head dated 05/04/2001. CLINICAL HISTORY: Mental status change, unknown cause. Triage notes; Per EMS, Pt, from home, presents due to AMS and failure to thrive. Pt denies pain. Pt lives alone. Pt's friend reported to EMS that he is more cantankerous and less jovial than usual. Also, his congested cough is worse than normal and he hasn't been eating or drinking well. Hx of dementia. Pt given en route. FINDINGS: BRAIN AND VENTRICLES: Moderate generalized cerebral and cerebellar volume loss present. Extensive cerebral white matter disease present. Chronic lacunar infarcts within the basal ganglia bilaterally, worse on the left. Chronic lacunar infarcts within the thalami bilaterally. No acute hemorrhage. Gray-white differentiation is preserved. No hydrocephalus. No extra-axial collection. No mass effect or midline shift. ORBITS: No acute abnormality. SINUSES: No acute abnormality. SOFT TISSUES  AND SKULL: Ovoid, circumscribed and partially calcified lesion demonstrated in the left parietal scalp, measuring approximately 2.2 x 1.1 x 2.2 cm, likely representing a sebaceous cyst. No acute soft tissue abnormality. No skull fracture. IMPRESSION: 1. No acute intracranial abnormality. 2. Moderate generalized cerebral and cerebellar volume loss, extensive cerebral white matter disease, and chronic lacunar infarcts within the basal ganglia bilaterally (worse on the left) and thalami bilaterally. 3. Ovoid, circumscribed, and partially calcified lesion in the left parietal scalp, likely representing a sebaceous cyst. Electronically signed by: evalene berrigan 03/10/2024 02:54 PM EDT RP Workstation: [id number]   DG Chest Port 1 View Result Date: 03/10/2024 CLINICAL DATA:  Cough.  Altered mental status.  Failure to thrive. EXAM: PORTABLE CHEST 1 VIEW COMPARISON:  02/13/2021 FINDINGS: The cardiomediastinal contours are normal. Unchanged left lung base scarring. Pulmonary vasculature is normal. No consolidation, pleural effusion, or pneumothorax. No acute osseous abnormalities are seen. IMPRESSION: Unchanged left lung base scarring. No acute findings. Electronically Signed   By: Andrea Gasman M.D.   On: 03/10/2024 14:40     Procedures   Medications Ordered in the ED  HYDROmorphone  (DILAUDID ) injection 1 mg (1 mg Intravenous Given 03/10/24 1356)                                    Medical Decision Making Amount and/or Complexity of Data Reviewed Labs: ordered. Radiology: ordered.  Risk Prescription drug management.   Iv ns. Continuous pulse ox and cardiac monitoring. Labs ordered/sent. Imaging ordered.   Differential diagnosis includes pna, uti, viral syndrome, dehydration, etc. Dispo decision including potential need for admission considered - will get labs and imaging and reassess.   Reviewed nursing notes and prior charts for additional history. External reports reviewed. Additional  history from: ems, friend.   Cardiac monitor: sinus rhythm, rate 88.  LR bolus. Pt  asking for food/drink - provided.   Labs reviewed/interpreted by me - ckd c/w prior. Hco3 mildly low, recent decreased po intake/dehydration, ivf bolus. Wbc 11, hct normal.   Xrays reviewed/interpreted by me - no pna.   CT reviewed/interpreted by me - atrophy, neg acute.   1532 UA/labs pending, signed out to Dr Avonne to check pending labs, recheck pt, and dispo appropriately.       Final diagnoses:  Generalized weakness  Dehydration  Stage 3b chronic kidney disease (HCC)  Non-productive cough    ED Discharge Orders     None          Bernard Drivers, MD 03/10/24 [phone]

## 2024-03-10 NOTE — ED Triage Notes (Signed)
 Per EMS, Pt, from home, presents due to AMS and failure to thrive.  Pt denies pain.  Pt lives alone.  Pt's friend reported to EMS that he is more cantankerous and less jovial than usual.  Also, his congested cough is worse than normal and he hasn't been eating or drinking well.  Hx of dementia.    Pt given en route.

## 2024-03-10 NOTE — ED Provider Notes (Signed)
 [place] EMERGENCY DEPARTMENT AT [facility] Provider Note   CSN: [id number] Arrival date & time: 03/10/24  1312     Patient presents with: Weakness and Altered Mental Status   Dan Salazar is a 63 y.o. male.   Pt with hx dementia, brought by friend who goes to check on patient - indicates patient seemed generally weak, more disheveled than normal, w non prod cough, and more cantankerous and less jovial than normal.  Pt limited historian - level 5 caveat - indicates is just hungry. Friend indicates pt lives independently, and has been able to drive self to grocery to get food, but had not seen patient in past couple weeks, so is not sure how regularly patient is getting out, eating, etc. No report of trauma/fall. No report of fevers.   The history is provided by the patient, medical records, a friend and the EMS personnel.  Weakness Associated symptoms: cough   Associated symptoms: no abdominal pain, no chest pain, no dysuria, no fever, no headaches, no shortness of breath and no vomiting   Altered Mental Status Associated symptoms: weakness   Associated symptoms: no abdominal pain, no fever, no headaches and no vomiting        Prior to Admission medications   Medication Sig Start Date End Date Taking? Authorizing Provider  acetaminophen  (TYLENOL ) 500 MG tablet Take 1,000 mg by mouth every 6 (six) hours as needed for mild pain.    [provider]  budesonide -formoterol  (SYMBICORT ) 160-4.5 MCG/ACT inhaler Inhale 2 puffs into the lungs 2 (two) times daily. Patient taking differently: Inhale 2 puffs into the lungs 2 (two) times daily as needed (wheezing/shortness of breath). 04/14/20   Olalere, Jennet LABOR, MD  dicyclomine  (BENTYL ) 20 MG tablet Take 1 tablet (20 mg total) by mouth 2 (two) times daily. 02/14/21   Neldon Hamp RAMAN, PA  donepezil  (ARICEPT ) 10 MG tablet Take half tablet (5 mg) daily for 2 weeks, then increase to the full tablet at 10 mg daily Patient taking  differently: Take 10 mg by mouth daily. 04/12/21   Wertman, Sara E, PA-C  tamsulosin  (FLOMAX ) 0.4 MG CAPS capsule Take 0.4 mg by mouth at bedtime. 04/13/21   [provider]  vitamin B-12 (CYANOCOBALAMIN ) 1000 MCG tablet Take 1,000 mcg by mouth in the morning and at bedtime.    [provider]    Allergies: Hctz [hydrochlorothiazide]    Review of Systems  Constitutional:  Negative for fever.  HENT:  Negative for sore throat and trouble swallowing.   Respiratory:  Positive for cough. Negative for shortness of breath.   Cardiovascular:  Negative for chest pain.  Gastrointestinal:  Negative for abdominal pain and vomiting.  Genitourinary:  Negative for dysuria.  Musculoskeletal:  Negative for neck pain and neck stiffness.  Neurological:  Positive for weakness. Negative for headaches.    Updated Vital Signs BP 137/86   Pulse 83   Temp (!) 97.5 F (36.4 C) (Oral)   Resp (!) 22   SpO2 97%   Physical Exam Vitals and nursing note reviewed.  Constitutional:      Appearance: Normal appearance. He is well-developed.  HENT:     Head: Atraumatic.     Comments: ~3 cm lesion left parietal scalp (friend indicates formerly evaluated and neg for malignancy).     Nose: Nose normal.     Mouth/Throat:     Mouth: Mucous membranes are moist.     Pharynx: Oropharynx is clear.  Eyes:  General: No scleral icterus.    Conjunctiva/sclera: Conjunctivae normal.     Pupils: Pupils are equal, round, and reactive to light.  Neck:     Vascular: No carotid bruit.     Trachea: No tracheal deviation.     Comments: Trachea midline, thyroid  not grossly enlarged or tender. No neck stiffness or rigidity.  Cardiovascular:     Rate and Rhythm: Normal rate and regular rhythm.     Pulses: Normal pulses.     Heart sounds: Normal heart sounds. No murmur heard.    No friction rub. No gallop.  Pulmonary:     Effort: Pulmonary effort is normal. No accessory muscle usage or respiratory distress.      Breath sounds: Normal breath sounds.  Abdominal:     General: Bowel sounds are normal. There is no distension.     Palpations: Abdomen is soft.     Tenderness: There is no abdominal tenderness.  Genitourinary:    Comments: No cva tenderness. Musculoskeletal:        General: No swelling.     Cervical back: Normal range of motion and neck supple. No rigidity.     Comments: CTLS spine, non tender, aligned, no step off. Good rom bil extremities without pain or focal bony tenderness.  Chronically avulsed left toe nail hanging on by tiny thread - removed, no sign of infection.  Sebaceous cyst to right chest, expressed material, no sign of infection to site.   Skin:    General: Skin is warm and dry.     Findings: No rash.     Comments: Subcutaneous lipoma mid to lower back (friend indicates is chronic).  Neurological:     Mental Status: He is alert.     Comments: Alert, speech clear. Motor/sens grossly intact bil.   Psychiatric:        Mood and Affect: Mood normal.     (all labs ordered are listed, but only abnormal results are displayed) Results for orders placed or performed during the hospital encounter of 03/10/24  Comprehensive metabolic panel   Collection Time: 03/10/24  1:26 PM  Result Value Ref Range   Sodium 132 (L) 135 - 145 mmol/L   Potassium 4.5 3.5 - 5.1 mmol/L   Chloride 95 (L) 98 - 111 mmol/L   CO2 17 (L) 22 - 32 mmol/L   Glucose, Bld 102 (H) 70 - 99 mg/dL   BUN 30 (H) 8 - 23 mg/dL   Creatinine, Ser 8.23 (H) 0.61 - 1.24 mg/dL   Calcium  9.2 8.9 - 10.3 mg/dL   Total Protein 6.7 6.5 - 8.1 g/dL   Albumin  3.8 3.5 - 5.0 g/dL   AST 18 15 - 41 U/L   ALT 21 0 - 44 U/L   Alkaline Phosphatase 61 38 - 126 U/L   Total Bilirubin 2.3 (H) 0.0 - 1.2 mg/dL   GFR, Estimated 42 (L) >60 mL/min   Anion gap 20 (H) 5 - 15  CBC   Collection Time: 03/10/24  1:26 PM  Result Value Ref Range   WBC 11.1 (H) 4.0 - 10.5 K/uL   RBC 4.78 4.22 - 5.81 MIL/uL   Hemoglobin 16.8 13.0 - 17.0  g/dL   HCT 52.7 60.9 - 47.9 %   MCV 98.7 80.0 - 100.0 fL   MCH 35.1 (H) 26.0 - 34.0 pg   MCHC 35.6 30.0 - 36.0 g/dL   RDW 87.1 88.4 - 84.4 %   Platelets 150 150 - 400 K/uL   nRBC  0.0 0.0 - 0.2 %      EKG: EKG Interpretation Date/Time:  Tuesday March 10 2024 13:30:01 EDT Ventricular Rate:  85 PR Interval:  49 QRS Duration:  122 QT Interval:  368 QTC Calculation: 438 R Axis:   58  Text Interpretation: Sinus rhythm Short PR interval Nonspecific intraventricular conduction delay Nonspecific T wave abnormality No significant change since last tracing Confirmed by Dan Salazar ([id number]) on 03/10/2024 2:43:19 PM  Radiology: CT Head Wo Contrast Result Date: 03/10/2024 EXAM: CT HEAD WITHOUT CONTRAST 03/10/2024 02:10:04 PM TECHNIQUE: CT of the head was performed without the administration of intravenous contrast. Automated exposure control, iterative reconstruction, and/or weight based adjustment of the mA/kV was utilized to reduce the radiation dose to as low as reasonably achievable. COMPARISON: MRI of the head dated 05/04/2001. CLINICAL HISTORY: Mental status change, unknown cause. Triage notes; Per EMS, Pt, from home, presents due to AMS and failure to thrive. Pt denies pain. Pt lives alone. Pt's friend reported to EMS that he is more cantankerous and less jovial than usual. Also, his congested cough is worse than normal and he hasn't been eating or drinking well. Hx of dementia. Pt given en route. FINDINGS: BRAIN AND VENTRICLES: Moderate generalized cerebral and cerebellar volume loss present. Extensive cerebral white matter disease present. Chronic lacunar infarcts within the basal ganglia bilaterally, worse on the left. Chronic lacunar infarcts within the thalami bilaterally. No acute hemorrhage. Gray-white differentiation is preserved. No hydrocephalus. No extra-axial collection. No mass effect or midline shift. ORBITS: No acute abnormality. SINUSES: No acute abnormality. SOFT TISSUES  AND SKULL: Ovoid, circumscribed and partially calcified lesion demonstrated in the left parietal scalp, measuring approximately 2.2 x 1.1 x 2.2 cm, likely representing a sebaceous cyst. No acute soft tissue abnormality. No skull fracture. IMPRESSION: 1. No acute intracranial abnormality. 2. Moderate generalized cerebral and cerebellar volume loss, extensive cerebral white matter disease, and chronic lacunar infarcts within the basal ganglia bilaterally (worse on the left) and thalami bilaterally. 3. Ovoid, circumscribed, and partially calcified lesion in the left parietal scalp, likely representing a sebaceous cyst. Electronically signed by: evalene berrigan 03/10/2024 02:54 PM EDT RP Workstation: [id number]   DG Chest Port 1 View Result Date: 03/10/2024 CLINICAL DATA:  Cough.  Altered mental status.  Failure to thrive. EXAM: PORTABLE CHEST 1 VIEW COMPARISON:  02/13/2021 FINDINGS: The cardiomediastinal contours are normal. Unchanged left lung base scarring. Pulmonary vasculature is normal. No consolidation, pleural effusion, or pneumothorax. No acute osseous abnormalities are seen. IMPRESSION: Unchanged left lung base scarring. No acute findings. Electronically Signed   By: Andrea Gasman M.D.   On: 03/10/2024 14:40     Procedures   Medications Ordered in the ED  HYDROmorphone  (DILAUDID ) injection 1 mg (1 mg Intravenous Given 03/10/24 1356)                                    Medical Decision Making Amount and/or Complexity of Data Reviewed Labs: ordered. Radiology: ordered.  Risk Prescription drug management.   Iv ns. Continuous pulse ox and cardiac monitoring. Labs ordered/sent. Imaging ordered.   Differential diagnosis includes pna, uti, viral syndrome, dehydration, etc. Dispo decision including potential need for admission considered - will get labs and imaging and reassess.   Reviewed nursing notes and prior charts for additional history. External reports reviewed. Additional  history from: ems, friend.   Cardiac monitor: sinus rhythm, rate 88.  LR bolus. Pt  asking for food/drink - provided.   Labs reviewed/interpreted by me - ckd c/w prior. Hco3 mildly low, recent decreased po intake/dehydration, ivf bolus. Wbc 11, hct normal.   Xrays reviewed/interpreted by me - no pna.   CT reviewed/interpreted by me - atrophy, neg acute.   1532 UA/labs pending, signed out to Dr Avonne to check pending labs, recheck pt, and dispo appropriately.       Final diagnoses:  Generalized weakness  Dehydration  Stage 3b chronic kidney disease (HCC)  Non-productive cough    ED Discharge Orders     None          Dan Drivers, MD 03/10/24 [id number]

## 2024-03-10 NOTE — Assessment & Plan Note (Signed)
 Poor p.o. intake for at least a week, malnourished appearing.  Neighbor concerned for poor appetite and weakness. - Nutrition consult - Start multivitamin, folate, B1 - Regular diet

## 2024-03-10 NOTE — Assessment & Plan Note (Signed)
 EKG with normal sinus, nonspecific T wave abnormalities.  No chest pain. Poor historian, may have worsening dyspnea. Altered state. - Troponins - Telemetry 24 hours - Echocardiogram for worsening dyspnea and diminished heart sounds on exam.  Reassuringly EKG not suspicious for tamponade.

## 2024-03-10 NOTE — H&P (Signed)
 Hospital Admission History and Physical Service Pager: [phone]  Patient name: Dan Salazar Medical record number: [id number] Date of Birth: [birth year] Age: 63 y.o. Gender: male  Primary Care Provider: Delores Delorise Lunger, FNP Consultants: None Code Status: DNR- confirmed by patient's power of attorney at bedside, who provided us  POA paper-work.  Preferred Emergency Contact:  Contact Information     Name Relation Home Work Mobile   Dan Salazar Friend [phone]  [phone]      Other Contacts   None on File      Chief Complaint: Altered mental status, worsening cough  Assessment and Plan: Dan Salazar is a 63 y.o. male presenting with altered mental status and worsening cough . Differential for presentation of this includes: Substance use, metabolic insult from malnourishment and dehydration, new stroke, progression of dementia.  Assessment & Plan Altered mental status Differential as above.  CT head without acute pathology, extensive cerebral white matter disease present with chronic infarcts throughout.  Passed bedside swallow. - Admit to FMTS, med-tele, attending Dr. Donzetta - Follow-up MRI - UDS, ethanol, TSH - Delirium/fall precautions - PT/OT Cough History of COPD, not take inhalers at home.  Not in acute exacerbation on exam today.  CXR not suggestive of pneumonia.  Normal work of breathing and on room air. - DuoNebs every 4 hours as needed - Consider starting maintenance inhaler such as Anoro Ellipta Malnourished (HCC) Poor p.o. intake for at least a week, malnourished appearing.  Neighbor concerned for poor appetite and weakness. - Nutrition consult - Start multivitamin, folate, B1 - Regular diet Pressure injury of sacral region, stage 2 (HCC) Noted at admission. - Wound care consult Chronic health problem HTN-not on medications at home, trend blood pressures start medication if necessary Dementia-question vascular dementia given prior strokes,  hold donepezil  at this time Tachycardia EKG with normal sinus, nonspecific T wave abnormalities.  No chest pain. Poor historian, may have worsening dyspnea. Altered state. - Troponins - Telemetry 24 hours - Echocardiogram for worsening dyspnea and diminished heart sounds on exam.  Reassuringly EKG not suspicious for tamponade.  FEN/GI: Regular diet VTE Prophylaxis: Lovenox   Disposition: Med/tele  History of Present Illness:  Dan Salazar is a 63 y.o. male presenting with altered mental status and worsening cough.  Patient lives alone, and is cared for by his neighbor who is also his power of attorney.  Paperwork for power of attorney provided by neighbor during encounter today.  Patient reportedly drives despite dementia, and typically is able to feed himself.  Neighbor reports that patient has not been driving for least 1 week, and not coming to church so he decided to check up on him.  On Sunday, he was concerned that patient had not been eating.  His home was messy and cluttered.  Neighbor denies seeing alcohol, but did see a pipe with a questionable substance inside of it.  His neighbor brought him food, and checked up on him again on Monday and today.  Today, patient had worsening altered state and neighbor decided to bring patient to ED for evaluation.  No known falls.  Significantly poor p.o. intake per neighbor.  Although patient has dementia, usually conversational and not agitated.  Patient is less conversational, and more agitated today.  In the ED, CT head obtained and unremarkable for acute pathology.  Urinalysis without infection.  CMP/CBC unremarkable for cause of altered state.  ED concern for safe disposition, as well as continued altered state-paged for admission.  Review Of Systems: Per HPI  Pertinent Past Medical History:  Remainder reviewed in history tab.   Pertinent Past Surgical History:  Remainder reviewed in history tab.  Pertinent Social History: Tobacco  use: Unknown Alcohol use: Unknown Other Substance use: Marijuana, potentially other substances Lives with alone  Pertinent Family History:  Remainder reviewed in history tab.   Important Outpatient Medications:  Donepezil  Multivitamin Tamsulosin   Remainder reviewed in medication history.   Objective: BP (!) 114/102   Pulse (!) 59   Temp (!) 97.2 F (36.2 C) (Axillary)   Resp 11   SpO2 100%  Exam: General: Disheveled, chronically ill-appearing, malnourished, resting comfortably in bed Eyes: EOM intact, no scleral icterus, normal conjunctiva ENTM: Atraumatic, normocephalic head. Neck: Free range of motion Cardiovascular: Tachycardic, slightly diminished heart sounds Respiratory: Coarse breath sounds bilaterally, no work of breathing on room air Gastrointestinal: Soft, nontender, distended Derm: Multiple excoriations of skin on upper and lower extremities, no overt signs of infection.  Noted to have stage II pressure injury to sacrum, see image in chart. Neuro: Oriented to self only  Labs:  CBC BMET  Recent Labs  Lab 03/10/24 1326  WBC 11.1*  HGB 16.8  HCT 47.2  PLT 150   Recent Labs  Lab 03/10/24 1326  NA 132*  K 4.5  CL 95*  CO2 17*  BUN 30*  CREATININE 1.76*  GLUCOSE 102*  CALCIUM  9.2     EKG: Normal sinus, nonspecific T wave changes.  ST elevation in V6.   Imaging Studies Performed:  DG Chest Port 1 View Result Date: 03/10/2024 IMPRESSION: Unchanged left lung base scarring. No acute findings.    Howell Lunger, DO 03/10/2024, 5:55 PM PGY-3, [facility]  FPTS Intern pager: [phone], text pages welcome Secure chat group [facility] Teaching Service

## 2024-03-10 NOTE — Hospital Course (Addendum)
 Dan Salazar is a 63 y.o.male with a history of vascular dementia, COPD, HTN who was admitted to the [facility] Teaching Service at [facility] for AMS. His hospital course is detailed below:  AMS:  Patient was brought to the ED by his neighbor (power of attorney) who noted patient was altered from baseline and disheveled. In the ED work-up was unremarkable, including CT head, MRI brain, UA, and CMP/CBC. MRI brain with chronic ischemic infarcts, white/gray matter atrophy (consistent with his known dementia) and no acute abnormalities. Of note UDS positive for THC, which may have contributed to initial presentation. Ultimately, infectious work-up was negative and patient returned to baseline with IV fluids and adequate nutrition. Suspect AMS related to progression of his dementia, substance use and acute metabolic insult. PT/OT recommend SNF for short-term rehab with planned long-term placement after ***.   Congestive Heart Failure (LEVF 40-45%) Echocardiogram obtained for dyspnea, tachycardia distant heart sounds and trace pitting edema. Newly worsened LVEF (40-45%) with regional wall abnormalities. Reassuringly tropon ins trended flat, tachycardia was NSR and resolved, and he remained evuolemic. Cardiology was consulted who recommended ***.  Cough Acute cough, in setting of known COPD not on inhalers. Not in COPD exacerbation. CXR negative for acute findings. Ultimately resolved at time of D/C. Recommend OP follow-up with PCP for management of COPD  Malnourished Dietician consulted and said that patient met criteria for severe protein malnutrition and recommended ***. He was monitored for refeeding syndrome, and remained stable ***. Patient had adequate PO and tolerating regular diet at time of D/C. 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

## 2024-03-10 NOTE — Consult Note (Signed)
 WOC Nurse Consult Note: Reason for Consult: sacral pressure injury  Wound type: Stage 2 Pressure Injury sacrum and buttocks  Pressure Injury POA: Yes Measurement: see nursing flowsheet  Wound bed: pink moist  Drainage (amount, consistency, odor) see nursing flowsheet  Periwound: erythema  Dressing procedure/placement/frequency: Cleanse sacral/buttocks wounds with NS, apply Xeroform gauze ([place] [phone]) to wound beds daily and secure with silicone foam.   Will write for Gerhardt's Butt Cream 2 times a day to surrounding intact skin.   POC discussed with bedside nurse. WOC team will not follow. Re-consult if further needs arise.   Thank you,    Powell Bar MSN, RN-BC, [employer]

## 2024-03-10 NOTE — Assessment & Plan Note (Signed)
 History of COPD, not take inhalers at home.  Not in acute exacerbation on exam today.  CXR not suggestive of pneumonia.  Normal work of breathing and on room air. - DuoNebs every 4 hours as needed - Consider starting maintenance inhaler such as Anoro Ellipta

## 2024-03-10 NOTE — Assessment & Plan Note (Signed)
 Noted at admission. - Wound care consult

## 2024-03-11 ENCOUNTER — Encounter (HOSPITAL_COMMUNITY): Payer: Self-pay | Admitting: Student

## 2024-03-11 ENCOUNTER — Inpatient Hospital Stay (HOSPITAL_COMMUNITY)

## 2024-03-11 ENCOUNTER — Other Ambulatory Visit: Payer: Self-pay

## 2024-03-11 DIAGNOSIS — R0609 Other forms of dyspnea: Secondary | ICD-10-CM

## 2024-03-11 DIAGNOSIS — R4182 Altered mental status, unspecified: Secondary | ICD-10-CM | POA: Diagnosis not present

## 2024-03-11 LAB — RAPID URINE DRUG SCREEN, HOSP PERFORMED
Amphetamines: NOT DETECTED
Barbiturates: NOT DETECTED
Benzodiazepines: NOT DETECTED
Cocaine: NOT DETECTED
Opiates: NOT DETECTED
Tetrahydrocannabinol: POSITIVE — AB

## 2024-03-11 LAB — BASIC METABOLIC PANEL WITH GFR
Anion gap: 12 (ref 5–15)
BUN: 28 mg/dL — ABNORMAL HIGH (ref 8–23)
CO2: 25 mmol/L (ref 22–32)
Calcium: 8.5 mg/dL — ABNORMAL LOW (ref 8.9–10.3)
Chloride: 96 mmol/L — ABNORMAL LOW (ref 98–111)
Creatinine, Ser: 1.49 mg/dL — ABNORMAL HIGH (ref 0.61–1.24)
GFR, Estimated: 52 mL/min — ABNORMAL LOW (ref >60)
Glucose, Bld: 139 mg/dL — ABNORMAL HIGH (ref 70–99)
Potassium: 3.6 mmol/L (ref 3.5–5.1)
Sodium: 133 mmol/L — ABNORMAL LOW (ref 135–145)

## 2024-03-11 LAB — ECHOCARDIOGRAM COMPLETE
Area-P 1/2: 2.65 cm2
S' Lateral: 3 cm

## 2024-03-11 LAB — CBC
HCT: 37 % — ABNORMAL LOW (ref 39.0–52.0)
Hemoglobin: 13.1 g/dL (ref 13.0–17.0)
MCH: 35.1 pg — ABNORMAL HIGH (ref 26.0–34.0)
MCHC: 35.4 g/dL (ref 30.0–36.0)
MCV: 99.2 fL (ref 80.0–100.0)
Platelets: 99 K/uL — ABNORMAL LOW (ref 150–400)
RBC: 3.73 MIL/uL — ABNORMAL LOW (ref 4.22–5.81)
RDW: 12.7 % (ref 11.5–15.5)
WBC: 7.3 K/uL (ref 4.0–10.5)
nRBC: 0 % (ref 0.0–0.2)

## 2024-03-11 LAB — TROPONIN I (HIGH SENSITIVITY): Troponin I (High Sensitivity): 12 ng/L (ref <18)

## 2024-03-11 NOTE — Progress Notes (Signed)
 Echocardiogram 2D Echocardiogram has been performed.  Tinnie FORBES Gosling RDCS 03/11/2024, 3:08 PM

## 2024-03-11 NOTE — ED Notes (Signed)
Put patient back on the monitor patient is resting with call bell in reach

## 2024-03-11 NOTE — ED Notes (Signed)
 Pharmacy messaged regarding missing gerhardts cream

## 2024-03-11 NOTE — Assessment & Plan Note (Addendum)
 CT head without acute pathology, extensive cerebral white matter disease present with chronic infarcts throughout. MRI head age-related cerebral atrophy with moderately advanced chronic microvascular ischemic disease, with multiple remote lacunar infarcts involving the bilateral basal ganglia, thalami, and pons. Ethanol nl <15, TSH nl 1.126. UDS positive for THC - PT/OT pending - Phosphorous + mag + BMP - Monitor for refeeding syndrome - Delirium/fall precautions

## 2024-03-11 NOTE — ED Notes (Signed)
 Help get patient straighten up in the bed patient has call bell in reach

## 2024-03-11 NOTE — Progress Notes (Signed)
 Occupational Therapy Evaluation Patient Details Name: Dan Salazar MRN: [id number] DOB: 1961/02/26 Today's Date: 03/11/2024   History of Present Illness   Pt is a 63 y.o. male who presented 8/12 with worsening AMS. MRI brain negative for acute intracranial abnormality. PMH: dementia, HTN, COPD, prostate cancer, anemia     Clinical Impressions PTA, pt lived alone and was independent in ADL and driving until ~7-6 weeks ago. Neighbor often helps with meals as able, but reports pt with difficulty caring for self, home management, and hygiene recently. Upon eval, pt performing UB ADL with set-up and LB ADL with up to mod A. Patient will benefit from continued inpatient follow up therapy, <3 hours/day. Discussed with [facility] POA potential transition to LTC after rehabilitation due to progressive nature of dementia.    If plan is discharge home, recommend the following:   A lot of help with walking and/or transfers;A lot of help with bathing/dressing/bathroom;Assistance with cooking/housework;Assist for transportation;Help with stairs or ramp for entrance     Functional Status Assessment   Patient has had a recent decline in their functional status and demonstrates the ability to make significant improvements in function in a reasonable and predictable amount of time.     Equipment Recommendations   Other (comment) (defer)     Recommendations for Other Services         Precautions/Restrictions   Precautions Precautions: Fall;Other (comment) Recall of Precautions/Restrictions: Impaired Precaution/Restrictions Comments: watch HR Restrictions Weight Bearing Restrictions Per Provider Order: No     Mobility Bed Mobility Overal bed mobility: Needs Assistance Bed Mobility: Supine to Sit, Sit to Supine     Supine to sit: Contact guard, HOB elevated Sit to supine: Contact guard assist, HOB elevated   General bed mobility comments: CGA for safety with bed mobility with pt  displaying some truncal instability when scooting.    Transfers Overall transfer level: Needs assistance Equipment used: 1 person hand held assist Transfers: Sit to/from Stand Sit to Stand: Min assist, +2 safety/equipment           General transfer comment: Pt needed minA HHA for balance when standing from edge of stretcher      Balance Overall balance assessment: Needs assistance Sitting-balance support: No upper extremity supported, Feet supported Sitting balance-Leahy Scale: Fair Sitting balance - Comments: static sitting EOB with supervision for safety; when performing dynamic tasks sitting EOB pt leans posteriorly and needs up to modA for balance   Standing balance support: Single extremity supported, Bilateral upper extremity supported, During functional activity Standing balance-Leahy Scale: Poor Standing balance comment: reliant on UE support and external physical assistance                           ADL either performed or assessed with clinical judgement   ADL Overall ADL's : Needs assistance/impaired Eating/Feeding: Set up;Bed level   Grooming: Set up;Sitting   Upper Body Bathing: Set up;Sitting   Lower Body Bathing: Minimal assistance;Sit to/from stand   Upper Body Dressing : Set up;Sitting   Lower Body Dressing: Moderate assistance;Sitting/lateral leans Lower Body Dressing Details (indicate cue type and reason): to don socks at EOB Toilet Transfer: Minimal assistance;+2 for physical assistance;+2 for safety/equipment;Stand-pivot           Functional mobility during ADLs: Minimal assistance       Vision Patient Visual Report: No change from baseline Additional Comments: pt resistant to vision testing and when askied, stating I can drive  Perception         Praxis         Pertinent Vitals/Pain Pain Assessment Pain Assessment: No/denies pain     Extremity/Trunk Assessment Upper Extremity Assessment Upper Extremity  Assessment: Generalized weakness   Lower Extremity Assessment Lower Extremity Assessment: Defer to PT evaluation       Communication Communication Communication: No apparent difficulties   Cognition Arousal: Alert Behavior During Therapy: Agitated Cognition: Cognition impaired, History of cognitive impairments (dementia at baseline, but was previously independent)   Orientation impairments: Time, Situation (place not formally assessed due to pt currently easily agitated) Awareness: Intellectual awareness impaired, Online awareness impaired (masks cognitive deficits with agitation. unaware of being soiled) Memory impairment (select all impairments): Short-term memory, Declarative long-term memory Attention impairment (select first level of impairment): Focused attention, Sustained attention Executive functioning impairment (select all impairments): Organization, Sequencing, Problem solving, Reasoning OT - Cognition Comments: needs cues to problem solve when difficulty arrises during ADL but does make some attempts to adjust (repositioned with LOB attempting LB ADL, but not enough to prevent another LOB)                 Following commands: Impaired Following commands impaired: Follows one step commands with increased time     Cueing  General Comments   Cueing Techniques: Verbal cues;Tactile cues  HR up to 150s with mobility.   Exercises     Shoulder Instructions      Home Living Family/patient expects to be discharged to:: Skilled nursing facility                                 Additional Comments: Pt typically lives home alone with PRN assistance from neighbor      Prior Functioning/Environment Prior Level of Function : Needs assist             Mobility Comments: Mod I holding onto furniture ADLs Comments: has recently become less hygenic, unaware of BM etc; neighbor and neighbor's family intermittently help with food and cleaning but otherwise  pt is independent; was still driving up until ~2 weeks ago. pt neighbor assists with medication management but it was not specified how much supervision he is able to give the patient.    OT Problem List: Decreased strength;Decreased activity tolerance;Impaired balance (sitting and/or standing);Decreased cognition;Decreased safety awareness;Cardiopulmonary status limiting activity   OT Treatment/Interventions: Self-care/ADL training;Therapeutic exercise;DME and/or AE instruction;Therapeutic activities;Cognitive remediation/compensation;Visual/perceptual remediation/compensation;Patient/family education      OT Goals(Current goals can be found in the care plan section)   Acute Rehab OT Goals Patient Stated Goal: get better OT Goal Formulation: With patient Time For Goal Achievement: 03/25/24 Potential to Achieve Goals: Good   OT Frequency:  Min 2X/week    Co-evaluation PT/OT/SLP Co-Evaluation/Treatment: Yes Reason for Co-Treatment: Necessary to address cognition/behavior during functional activity;For patient/therapist safety;To address functional/ADL transfers PT goals addressed during session: Mobility/safety with mobility;Balance OT goals addressed during session: ADL's and self-care      AM-PAC OT 6 Clicks Daily Activity     Outcome Measure Help from another person eating meals?: A Little Help from another person taking care of personal grooming?: A Little Help from another person toileting, which includes using toliet, bedpan, or urinal?: A Lot Help from another person bathing (including washing, rinsing, drying)?: A Lot Help from another person to put on and taking off regular upper body clothing?: A Little Help from another person to put on  and taking off regular lower body clothing?: A Lot 6 Click Score: 15   End of Session Equipment Utilized During Treatment: Gait belt Nurse Communication: Mobility status  Activity Tolerance: Patient tolerated treatment well Patient  left: in bed;with call bell/phone within reach  OT Visit Diagnosis: Unsteadiness on feet (R26.81);Muscle weakness (generalized) (M62.81);Other symptoms and signs involving cognitive function                Time: 9073-9042 OT Time Calculation (min): 31 min Charges:  OT General Charges $OT Visit: 1 Visit OT Evaluation $OT Eval Moderate Complexity: 1 Mod  Elma JONETTA Lebron FREDERICK, OTR/L [facility] Acute Rehabilitation Office: [phone]   Elma JONETTA Lebron 03/11/2024, 1:31 PM

## 2024-03-11 NOTE — Group Note (Deleted)
 Date:  03/11/2024 Time:  2:22 PM  Group Topic/Focus:  Wellness Toolbox:   The focus of this group is to discuss various aspects of wellness, balancing those aspects and exploring ways to increase the ability to experience wellness.  Patients will create a wellness toolbox for use upon discharge.     Participation Level:  {BHH PARTICIPATION OZCZO:77735}  Participation Quality:  {BHH PARTICIPATION QUALITY:22265}  Affect:  {BHH AFFECT:22266}  Cognitive:  {BHH COGNITIVE:22267}  Insight: {BHH Insight2:20797}  Engagement in Group:  {BHH ENGAGEMENT IN HMNLE:77731}  Modes of Intervention:  {BHH MODES OF INTERVENTION:22269}  Additional Comments:  ***  Dan Curtistine Salazar 03/11/2024, 2:22 PM

## 2024-03-11 NOTE — Assessment & Plan Note (Addendum)
 Noted at admission. - Wound care consulted and pending

## 2024-03-11 NOTE — Evaluation (Signed)
 Physical Therapy Evaluation Patient Details Name: Dan Salazar MRN: [id number] DOB: 10-19-1960 Today's Date: 03/11/2024  History of Present Illness  Pt is a 63 y.o. male who presented 8/12 with worsening AMS. MRI brain negative for acute intracranial abnormality. PMH: dementia, HTN, COPD, prostate cancer, anemia  Clinical Impression  Pt presents with condition above and deficits mentioned below, see PT Problem List. PTA, he was mod I holding onto furniture when mobilizing and living alone. His neighbor and the neighbor's family would check on him and assist with meals and cleaning as needed. Currently, the pt is frustrated when asked questions, but redirectable. He is limited by deficits noted in endurance and by his HR jumping up to the 150s with gentle standing mobility. His HR recovered with seated and supine rest. He is currently requiring minA for transfers and taking a few steps with HHA and modA for dynamic sitting balance. He is at high risk for falls and currently would be unsafe to d/c home alone. He could benefit from short-term inpatient rehab, < 3 hours/day. His neighbor is interested in pursuing long-term memory care following rehab. Will continue to follow acutely.        If plan is discharge home, recommend the following: A little help with walking and/or transfers;A lot of help with bathing/dressing/bathroom;Assistance with cooking/housework;Direct supervision/assist for medications management;Direct supervision/assist for financial management;Help with stairs or ramp for entrance;Assist for transportation;Supervision due to cognitive status   Can travel by private vehicle   Yes    Equipment Recommendations Other (comment) (TBD)  Recommendations for Other Services       Functional Status Assessment Patient has had a recent decline in their functional status and demonstrates the ability to make significant improvements in function in a reasonable and predictable amount of time.      Precautions / Restrictions Precautions Precautions: Fall;Other (comment) Recall of Precautions/Restrictions: Impaired Precaution/Restrictions Comments: watch HR Restrictions Weight Bearing Restrictions Per Provider Order: No      Mobility  Bed Mobility Overal bed mobility: Needs Assistance Bed Mobility: Supine to Sit, Sit to Supine     Supine to sit: Contact guard, HOB elevated Sit to supine: Contact guard assist, HOB elevated   General bed mobility comments: CGA for safety with bed mobility with pt displaying some truncal instability when scooting.    Transfers Overall transfer level: Needs assistance Equipment used: 1 person hand held assist Transfers: Sit to/from Stand Sit to Stand: Min assist, +2 safety/equipment           General transfer comment: Pt needed minA HHA for balance when standing from edge of stretcher    Ambulation/Gait Ambulation/Gait assistance: Min assist, +2 safety/equipment Gait Distance (Feet): 3 Feet Assistive device: 1 person hand held assist Gait Pattern/deviations: Step-through pattern, Decreased step length - right, Decreased step length - left, Decreased stride length, Trunk flexed Gait velocity: reduced Gait velocity interpretation: <1.31 ft/sec, indicative of household ambulator   General Gait Details: Pt takes slow, small, unsteady steps with minA HHA for balance. Pt easily fatigues and is limited by elevated HR.  Stairs            Wheelchair Mobility     Tilt Bed    Modified Rankin (Stroke Patients Only) Modified Rankin (Stroke Patients Only) Pre-Morbid Rankin Score: Slight disability Modified Rankin: Moderately severe disability     Balance Overall balance assessment: Needs assistance Sitting-balance support: No upper extremity supported, Feet supported Sitting balance-Leahy Scale: Fair Sitting balance - Comments: static sitting EOB with supervision for  safety; when performing dynamic tasks sitting EOB pt  leans posteriorly and needs up to modA for balance   Standing balance support: Single extremity supported, Bilateral upper extremity supported, During functional activity Standing balance-Leahy Scale: Poor Standing balance comment: reliant on UE support and external physical assistance                             Pertinent Vitals/Pain Pain Assessment Pain Assessment: No/denies pain    Home Living Family/patient expects to be discharged to:: Skilled nursing facility                   Additional Comments: Pt typically lives home alone with PRN assistance from neighbor    Prior Function Prior Level of Function : Needs assist             Mobility Comments: Mod I holding onto furniture ADLs Comments: has recently become less hygenic, unaware of BM etc; neighbor and neighbor's family intermittently help with food and cleaning but otherwise pt is independent; was still driving up until ~2 weeks ago     Extremity/Trunk Assessment   Upper Extremity Assessment Upper Extremity Assessment: Defer to OT evaluation    Lower Extremity Assessment Lower Extremity Assessment: Generalized weakness       Communication   Communication Communication: No apparent difficulties    Cognition Arousal: Alert Behavior During Therapy: Agitated   PT - Cognitive impairments: History of cognitive impairments                       PT - Cognition Comments: Hx of dementia but recently has progressed per pt's neighbor. Pt intermittently gets irritated with answering questions, getting louder in response to express his frustration. Pt able to be redirected though. Following commands: Impaired Following commands impaired: Follows one step commands with increased time     Cueing Cueing Techniques: Verbal cues, Tactile cues     General Comments General comments (skin integrity, edema, etc.): HR up to 150s with standing mobility, up to 140s with dynamic sitting, recovered  to </= 130s with seated resting    Exercises     Assessment/Plan    PT Assessment Patient needs continued PT services  PT Problem List Decreased strength;Decreased activity tolerance;Decreased balance;Decreased mobility;Decreased cognition;Decreased safety awareness;Decreased knowledge of use of DME       PT Treatment Interventions DME instruction;Gait training;Stair training;Functional mobility training;Therapeutic activities;Therapeutic exercise;Balance training;Neuromuscular re-education;Cognitive remediation;Patient/family education    PT Goals (Current goals can be found in the Care Plan section)  Acute Rehab PT Goals Patient Stated Goal: did not state; neighbor reports desire may be for long-term memory care PT Goal Formulation: With patient/family Time For Goal Achievement: 03/25/24 Potential to Achieve Goals: Fair    Frequency Min 2X/week     Co-evaluation   Reason for Co-Treatment: Necessary to address cognition/behavior during functional activity;For patient/therapist safety;To address functional/ADL transfers PT goals addressed during session: Mobility/safety with mobility;Balance         AM-PAC PT 6 Clicks Mobility  Outcome Measure Help needed turning from your back to your side while in a flat bed without using bedrails?: A Little Help needed moving from lying on your back to sitting on the side of a flat bed without using bedrails?: A Little Help needed moving to and from a bed to a chair (including a wheelchair)?: A Little Help needed standing up from a chair using your arms (e.g., wheelchair or  bedside chair)?: A Little Help needed to walk in hospital room?: Total (<20 ft) Help needed climbing 3-5 steps with a railing? : Total 6 Click Score: 14    End of Session Equipment Utilized During Treatment: Gait belt Activity Tolerance: Patient limited by fatigue;Other (comment) (limited by elevated HR) Patient left: in bed;with call bell/phone within reach    PT Visit Diagnosis: Unsteadiness on feet (R26.81);Other abnormalities of gait and mobility (R26.89);Muscle weakness (generalized) (M62.81);Difficulty in walking, not elsewhere classified (R26.2)    Time: 9069-9044 PT Time Calculation (min) (ACUTE ONLY): 25 min   Charges:   PT Evaluation $PT Eval Moderate Complexity: 1 Mod   PT General Charges $$ ACUTE PT VISIT: 1 Visit         Theo Ferretti, PT, DPT Acute Rehabilitation Services  Office: [phone]   Theo CHRISTELLA Ferretti 03/11/2024, 10:56 AM

## 2024-03-11 NOTE — Assessment & Plan Note (Deleted)
 EKG with normal sinus, nonspecific T wave abnormalities.  No chest pain. Poor historian, may have worsening dyspnea. Altered state. - Troponins - Telemetry 24 hours - Echocardiogram pending for worsening dyspnea and diminished heart sounds on exam  Reassuringly EKG not suspicious for tamponade

## 2024-03-11 NOTE — Assessment & Plan Note (Addendum)
 Poor PO intake for at least a week, malnourished appearing. Neighbor concerned for poor appetite and weakness. - Nutrition consulted and pending - Regular diet - Continue multivitamin and B1

## 2024-03-11 NOTE — Assessment & Plan Note (Addendum)
 HTN-not on medications at home, trend blood pressures start medication if necessary. Current BP  Dementia-question vascular dementia given prior strokes, hold donepezil  at this time

## 2024-03-11 NOTE — Plan of Care (Signed)

## 2024-03-11 NOTE — Assessment & Plan Note (Addendum)
 History of COPD, not take inhalers at home. Breathing on room air comfortably on exam today. Chest x-ray no acute findings. -DuoNebs every 4 hours as needed

## 2024-03-11 NOTE — Progress Notes (Signed)
 Daily Progress Note Intern Pager: [phone]  Patient name: Dan Salazar Medical record number: [id number] Date of birth: 1960-09-28 Age: 63 y.o. Gender: male  Primary Care Provider: Delores Delorise Lunger, FNP Consultants: Nutrition Code Status: DNR- confirmed by patient's power of attorney at bedside, who provided us  POA paper-work.   Pt Overview and Major Events to Date:  Admitted (8/12)  Assessment and Plan: Dan Salazar is a 63 y.o. male presenting with altered mental status and cough. Pertinent PMH/PSH includes HTN, dementia, COPD, prostate cancer, anxiety.  On further discussion with patient's healthcare power of attorney, his healthcare power of attorney has been managing patient's finances and medications since 2019.  Due to life circumstances recently he has not been checking on patient as frequently.  When he went to check on him prior to arrival to the hospital he noticed that patient was disheveled, incontinent, and house was not taken care of.  I suspect that this is a progression of his dementia and do not suspect that there has been an acute medical change for him recently. Assessment & Plan Altered mental status CT head without acute pathology, extensive cerebral white matter disease present with chronic infarcts throughout. MRI head age-related cerebral atrophy with moderately advanced chronic microvascular ischemic disease, with multiple remote lacunar infarcts involving the bilateral basal ganglia, thalami, and pons. Ethanol nl <15, TSH nl 1.126. UDS positive for THC - PT/OT pending - Phosphorous + mag + BMP - Monitor for refeeding syndrome - Delirium/fall precautions Cough History of COPD, not take inhalers at home. Breathing on room air comfortably on exam today. Chest x-ray no acute findings. - DuoNebs every 4 hours as needed Malnourished (HCC) Poor PO intake for at least a week, malnourished appearing. Neighbor concerned for poor appetite and weakness. -  Nutrition consulted and pending - Regular diet - Continue multivitamin and B1 Pressure injury of sacral region, stage 2 (HCC) Noted at admission. - Wound care consulted and pending Chronic health problem HTN-not on medications at home, trend blood pressures start medication if necessary. Current BP  Dementia-question vascular dementia given prior strokes, hold donepezil  at this time  FEN/GI: Regular diet PPx: Lovenox  Dispo: Med/tele  Subjective:  63 year old male with AMS and agitated when asked questions. Patient's best friend states that patient lives on his own, and that he regularly checks up on patient, but was not able to check up on him as much this past week due to personal obligations. Patient has dementia and not able to drive, and his friend is concerned that he has not been able to take care of himself/feed himself as he was disheveled, not his usual self, and found in unkept home environment.  Pertinent Past Medical History: Dementia COPD HTN Prostate cancer RLS Anemia Anxiety   Pertinent Past Surgical History: Inguinal hernia repair VATS/empyema Cystoscopy with biopsy  Pertinent Social History: Tobacco use: Unknown Alcohol use: Unknown Other Substance use: Marijuana, potentially other substances Lives with alone   Pertinent Family History: Father- lung cancer   Important Outpatient Medications: Donepezil  Multivitamin Tamsulosin    Remainder reviewed in medication history Objective: Temp:  [97.1 F (36.2 C)-97.5 F (36.4 C)] 97.1 F (36.2 C) (08/13 0421) Pulse Rate:  [51-118] 71 (08/13 0600) Resp:  [11-28] 16 (08/13 0600) BP: (108-147)/(72-102) 113/72 (08/13 0600) SpO2:  [89 %-100 %] 99 % (08/13 0600) Physical Exam: General: Laying on bed, agitated when asked questions Neuro: AxOx2 to self and location Respiratory: No respiratory distress, breathing comfortably, able to speak  in full sentences Skin: warm and dry, no rashes noted on exposed  skin Psych: Agitated mood, not willing to completed physical exam. Not violent.   Most recent CBC Lab Results  Component Value Date   WBC 7.3 03/11/2024   HGB 13.1 03/11/2024   HCT 37.0 (L) 03/11/2024   MCV 99.2 03/11/2024   PLT 99 (L) 03/11/2024   Most recent BMP    Latest Ref Rng & Units 03/11/2024   12:43 AM  BMP  Glucose 70 - 99 mg/dL 860   BUN 8 - 23 mg/dL 28   Creatinine 9.38 - 1.24 mg/dL 8.50   Sodium 864 - 854 mmol/L 133   Potassium 3.5 - 5.1 mmol/L 3.6   Chloride 98 - 111 mmol/L 96   CO2 22 - 32 mmol/L 25   Calcium  8.9 - 10.3 mg/dL 8.5     Other pertinent labs Na 132>133 Cl 95>96 Gluc 102>139 BUN 30>28 Cr 1.76>1.49 Anion gap 20>12 Total bili 2.3 GFR 42>52 Trop HS 11>12 nl  Plts 150>99 TSH 1.126 nl  UA hazy, ketones 20 Alcohol <15 nl  Imaging/Diagnostic Tests: Radiologist impressions:  Chest x-ray: 1. Unchanged left lung base scarring. No acute findings. Comparison 01/2021  Head CT w/o contrast 1. No acute intracranial abnormality. 2. Moderate generalized cerebral and cerebellar volume loss, extensive cerebral white matter disease, and chronic lacunar infarcts within the basal ganglia bilaterally (worse on the left) and thalami bilaterally. 3. Ovoid, circumscribed, and partially calcified lesion in the left parietal scalp, likely representing a sebaceous cyst.  MR Brain w/o contrast 1. No acute intracranial abnormality. 2. Age-related cerebral atrophy with moderately advanced chronic microvascular ischemic disease, with multiple remote lacunar infarcts involving the bilateral basal ganglia, thalami, and pons. 3. 2.3 cm nodular lesion at the left parietal scalp, slightly increased as compared to prior MRI from 05/04/2021. Findings indeterminate, and could reflect a sebaceous or trichilemmal cyst.  KUB 1. Gaseous distention of the colon to the level of the rectum. Stool burden is low. 2. Small left pleural effusion.  Bladder  scan Pending   [name] [name], Medical Student 03/11/2024, 8:47 AM  [name] FPTS Intern pager: [phone], text pages welcome Secure chat group [facility] Teaching Service   I was personally present and performed or re-performed the history, physical exam and medical decision making activities of this service and have verified that the service and findings are accurately documented in the student's note.  Deloros Beretta, DO                  03/11/2024, 6:08 PM

## 2024-03-12 ENCOUNTER — Other Ambulatory Visit: Payer: Self-pay

## 2024-03-12 ENCOUNTER — Encounter (HOSPITAL_COMMUNITY)
Admission: EM | Disposition: A | Discharge status: Skilled Nursing Facility | Payer: Self-pay | Source: Home / Self Care | Attending: Family Medicine

## 2024-03-12 DIAGNOSIS — I5023 Acute on chronic systolic (congestive) heart failure: Secondary | ICD-10-CM | POA: Diagnosis not present

## 2024-03-12 DIAGNOSIS — E43 Unspecified severe protein-calorie malnutrition: Secondary | ICD-10-CM

## 2024-03-12 DIAGNOSIS — R4182 Altered mental status, unspecified: Secondary | ICD-10-CM | POA: Diagnosis not present

## 2024-03-12 DIAGNOSIS — E46 Unspecified protein-calorie malnutrition: Secondary | ICD-10-CM

## 2024-03-12 DIAGNOSIS — N189 Chronic kidney disease, unspecified: Secondary | ICD-10-CM

## 2024-03-12 DIAGNOSIS — I509 Heart failure, unspecified: Secondary | ICD-10-CM

## 2024-03-12 HISTORY — PX: RIGHT/LEFT HEART CATH AND CORONARY ANGIOGRAPHY: CATH118266

## 2024-03-12 LAB — POCT I-STAT EG7
Acid-Base Excess: 0 mmol/L (ref 0.0–2.0)
Bicarbonate: 25.6 mmol/L (ref 20.0–28.0)
Calcium, Ion: 1.19 mmol/L (ref 1.15–1.40)
HCT: 34 % — ABNORMAL LOW (ref 39.0–52.0)
Hemoglobin: 11.6 g/dL — ABNORMAL LOW (ref 13.0–17.0)
O2 Saturation: 66 %
Potassium: 4 mmol/L (ref 3.5–5.1)
Sodium: 134 mmol/L — ABNORMAL LOW (ref 135–145)
TCO2: 27 mmol/L (ref 22–32)
pCO2, Ven: 42.7 mmHg — ABNORMAL LOW (ref 44–60)
pH, Ven: 7.386 (ref 7.25–7.43)
pO2, Ven: 35 mmHg (ref 32–45)

## 2024-03-12 LAB — BASIC METABOLIC PANEL WITH GFR
Anion gap: 9 (ref 5–15)
BUN: 24 mg/dL — ABNORMAL HIGH (ref 8–23)
CO2: 29 mmol/L (ref 22–32)
Calcium: 8.6 mg/dL — ABNORMAL LOW (ref 8.9–10.3)
Chloride: 97 mmol/L — ABNORMAL LOW (ref 98–111)
Creatinine, Ser: 1.6 mg/dL — ABNORMAL HIGH (ref 0.61–1.24)
GFR, Estimated: 48 mL/min — ABNORMAL LOW (ref >60)
Glucose, Bld: 105 mg/dL — ABNORMAL HIGH (ref 70–99)
Potassium: 3.5 mmol/L (ref 3.5–5.1)
Sodium: 135 mmol/L (ref 135–145)

## 2024-03-12 LAB — POCT I-STAT 7, (LYTES, BLD GAS, ICA,H+H)
Acid-Base Excess: 1 mmol/L (ref 0.0–2.0)
Bicarbonate: 24.9 mmol/L (ref 20.0–28.0)
Calcium, Ion: 1.15 mmol/L (ref 1.15–1.40)
HCT: 35 % — ABNORMAL LOW (ref 39.0–52.0)
Hemoglobin: 11.9 g/dL — ABNORMAL LOW (ref 13.0–17.0)
O2 Saturation: 96 %
Potassium: 3.9 mmol/L (ref 3.5–5.1)
Sodium: 134 mmol/L — ABNORMAL LOW (ref 135–145)
TCO2: 26 mmol/L (ref 22–32)
pCO2 arterial: 37.6 mmHg (ref 32–48)
pH, Arterial: 7.429 (ref 7.35–7.45)
pO2, Arterial: 81 mmHg — ABNORMAL LOW (ref 83–108)

## 2024-03-12 LAB — MAGNESIUM: Magnesium: 1.8 mg/dL (ref 1.7–2.4)

## 2024-03-12 LAB — PHOSPHORUS: Phosphorus: 2.5 mg/dL (ref 2.5–4.6)

## 2024-03-12 MED ORDER — MAGNESIUM SULFATE 2 GM/50ML IV SOLN
2.0000 g | Freq: Once | INTRAVENOUS | Status: AC
  Administered 2024-03-12: 2 g via INTRAVENOUS
  Filled 2024-03-12: qty 50

## 2024-03-12 MED ORDER — SODIUM CHLORIDE 0.9% FLUSH: 3.0000 mL | INTRAVENOUS | Status: DC | PRN

## 2024-03-12 MED ORDER — FENTANYL CITRATE (PF) 100 MCG/2ML IJ SOLN
INTRAMUSCULAR | Status: AC
Start: 2024-03-12 — End: 2024-03-12
  Filled 2024-03-12: qty 2

## 2024-03-12 MED ORDER — LIDOCAINE HCL (PF) 1 % IJ SOLN
INTRAMUSCULAR | Status: DC | PRN
  Administered 2024-03-12 (×2): 5 mL

## 2024-03-12 MED ORDER — ACETAMINOPHEN 325 MG PO TABS: 650.0000 mg | ORAL_TABLET | ORAL | Status: DC | PRN

## 2024-03-12 MED ORDER — LABETALOL HCL 5 MG/ML IV SOLN: 10.0000 mg | INTRAVENOUS | Status: AC | PRN

## 2024-03-12 MED ORDER — IOHEXOL 350 MG/ML SOLN
INTRAVENOUS | Status: DC | PRN
  Administered 2024-03-12: 45 mL

## 2024-03-12 MED ORDER — SODIUM CHLORIDE 0.9 % IV SOLN
INTRAVENOUS | Status: AC | PRN
  Administered 2024-03-12: 100 mL/h via INTRAVENOUS

## 2024-03-12 MED ORDER — ONDANSETRON HCL 4 MG/2ML IJ SOLN
4.0000 mg | Freq: Four times a day (QID) | INTRAMUSCULAR | Status: DC | PRN
Start: 2024-03-12 — End: 2024-03-17

## 2024-03-12 MED ORDER — HYDRALAZINE HCL 20 MG/ML IJ SOLN: 10.0000 mg | INTRAMUSCULAR | Status: AC | PRN

## 2024-03-12 MED ORDER — SODIUM CHLORIDE 0.9 % IV SOLN: INTRAVENOUS | Status: AC

## 2024-03-12 MED ORDER — ENSURE PLUS HIGH PROTEIN PO LIQD
237.0000 mL | Freq: Three times a day (TID) | ORAL | Status: DC
  Administered 2024-03-12 – 2024-03-17 (×12): 237 mL via ORAL

## 2024-03-12 MED ORDER — SODIUM CHLORIDE 0.9% FLUSH
3.0000 mL | Freq: Two times a day (BID) | INTRAVENOUS | Status: DC
  Administered 2024-03-12 – 2024-03-17 (×10): 3 mL via INTRAVENOUS

## 2024-03-12 MED ORDER — SODIUM CHLORIDE 0.9 % IV SOLN: 250.0000 mL | INTRAVENOUS | Status: AC | PRN

## 2024-03-12 MED ORDER — POTASSIUM CHLORIDE CRYS ER 20 MEQ PO TBCR
40.0000 meq | EXTENDED_RELEASE_TABLET | Freq: Once | ORAL | Status: AC
Start: 2024-03-12 — End: 2024-03-12
  Administered 2024-03-12: 40 meq via ORAL
  Filled 2024-03-12: qty 2

## 2024-03-12 MED ORDER — HEPARIN (PORCINE) IN NACL 1000-0.9 UT/500ML-% IV SOLN
INTRAVENOUS | Status: DC | PRN
Start: 2024-03-12 — End: 2024-03-12
  Administered 2024-03-12 (×2): 500 mL

## 2024-03-12 MED ORDER — ASPIRIN 81 MG PO CHEW
81.0000 mg | CHEWABLE_TABLET | Freq: Every day | ORAL | Status: DC
  Administered 2024-03-13 – 2024-03-17 (×5): 81 mg via ORAL
  Filled 2024-03-12 (×5): qty 1

## 2024-03-12 MED ORDER — ASPIRIN 81 MG PO CHEW: 81.0000 mg | CHEWABLE_TABLET | ORAL | Status: DC

## 2024-03-12 MED ORDER — MIDAZOLAM HCL 2 MG/2ML IJ SOLN
INTRAMUSCULAR | Status: AC
  Filled 2024-03-12: qty 2

## 2024-03-12 MED ORDER — OXYCODONE HCL 5 MG PO TABS: 5.0000 mg | ORAL_TABLET | ORAL | Status: DC | PRN

## 2024-03-12 MED ORDER — MIDAZOLAM HCL 2 MG/2ML IJ SOLN
INTRAMUSCULAR | Status: DC | PRN
  Administered 2024-03-12: 1 mg via INTRAVENOUS

## 2024-03-12 MED ORDER — FENTANYL CITRATE (PF) 100 MCG/2ML IJ SOLN
INTRAMUSCULAR | Status: DC | PRN
  Administered 2024-03-12 (×3): 25 ug via INTRAVENOUS

## 2024-03-12 MED ORDER — CARVEDILOL 6.25 MG PO TABS
3.1250 mg | ORAL_TABLET | Freq: Two times a day (BID) | ORAL | Status: DC
  Administered 2024-03-13 – 2024-03-16 (×7): 3.125 mg via ORAL
  Filled 2024-03-12: qty 0.5
  Filled 2024-03-12 (×6): qty 1
  Filled 2024-03-12: qty 0.5
  Filled 2024-03-12: qty 1

## 2024-03-12 MED ORDER — FREE WATER
500.0000 mL | Freq: Once | Status: AC
  Administered 2024-03-12: 500 mL via ORAL

## 2024-03-12 NOTE — NC FL2 (Signed)
 [place]  MEDICAID FL2 LEVEL OF CARE FORM     IDENTIFICATION  Patient Name: Dan Salazar Birthdate: [birth year] Sex: male Admission Date (Current Location): 03/10/2024  [facility] and [place] Number:  Producer, television/film/video and Address:  [facility] [place], 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   Cough 03/10/2024   Malnourished (HCC) 03/10/2024   Pressure injury of sacral region, stage 2 (HCC) 03/10/2024   Chronic health problem 03/10/2024   Malignant neoplasm of prostate (HCC) 07/15/2020   Chronic obstructive lung disease (HCC) 03/08/2020   S/P right inguinal hernia repair 05/23/2018   Difficult intravenous access    Encounter for intubation    Acute respiratory failure with hypoxia (HCC)    Pneumonia    Atelectasis    Pleural effusion on left 10/31/2017   Incarcerated inguinal hernia 04/30/2017   Hypokalemia 09/28/2016   Hyponatremia    Metabolic alkalosis    Essential hypertension     Orientation RESPIRATION BLADDER Height & Weight     Self  Normal Continent, External catheter Weight: 160 lb 7.9 oz (72.8 kg) Height:     BEHAVIORAL SYMPTOMS/MOOD NEUROLOGICAL BOWEL NUTRITION STATUS      Continent Diet (see dc summary)  AMBULATORY STATUS COMMUNICATION OF NEEDS Skin   Extensive Assist Verbally PU Stage and Appropriate Care (sacral region pressure injury)   PU Stage 2 Dressing:  (-Wound care: Gerhardt's Butt Cream 2 times a day to  surrounding intact skin,)                   Personal Care Assistance Level of Assistance  Bathing, Feeding, Dressing Bathing Assistance: Maximum assistance Feeding assistance: Limited assistance Dressing Assistance: Maximum assistance     Functional Limitations Info  Sight, Hearing, Speech Sight Info: Adequate Hearing Info: Adequate Speech Info: Adequate    SPECIAL CARE FACTORS FREQUENCY  PT (By licensed PT), OT (By licensed OT)     PT Frequency: 5x/week OT Frequency: 5x/week            Contractures Contractures Info: Not present    Additional Factors Info  Code Status, Allergies Code Status Info: DNR Allergies Info: Hctz (Hydrochlorothiazide)           Current Medications (03/12/2024):  This is the current hospital active medication list Current Facility-Administered Medications  Medication Dose Route Frequency Provider Last Rate Last Admin   0.9 %  sodium chloride  infusion   Intravenous Continuous Claudene Pacific, MD 75 mL/hr at 03/12/24 1059 New Bag at 03/12/24 1059   [START ON 03/13/2024] aspirin  chewable tablet 81 mg  81 mg Oral Pre-Cath Kadakia, Ajay, MD       carvedilol  (COREG ) tablet 3.125 mg  3.125 mg Oral BID WC Claudene Pacific, MD       enoxaparin  (LOVENOX ) injection 30 mg  30 mg Subcutaneous Q24H Howell Lunger, DO   30 mg at 03/11/24 1638   folic acid  (FOLVITE ) tablet 1 mg  1 mg Oral  Daily Howell Lunger, DO   1 mg at 03/12/24 9158   Gerhardt's butt cream   Topical BID Pray, Margaret E, MD   Given at 03/12/24 0900   ipratropium-albuterol  (DUONEB) 0.5-2.5 (3) MG/3ML nebulizer solution 3 mL  3 mL Nebulization Q4H PRN Howell Lunger, DO       multivitamin with minerals tablet 1 tablet  1 tablet Oral Daily Bronson, Martin, DO   1 tablet at 03/12/24 9158   tamsulosin  (FLOMAX ) capsule 0.4 mg  0.4 mg Oral QHS Cleotilde Perkins, DO   0.4 mg at 03/11/24 2029   thiamine  (VITAMIN B1) tablet 100 mg  100 mg Oral Daily Bronson, Martin, DO   100 mg at 03/12/24 9158      Discharge Medications: Please see discharge summary for a list of discharge medications.  Relevant Imaging Results:  Relevant Lab Results:   Additional Information [id number]  Arville Postlewaite A [place], LCSW

## 2024-03-12 NOTE — Progress Notes (Signed)
 Pt.stable - handoff to Laurne, RN, Rt groin level 0

## 2024-03-12 NOTE — Plan of Care (Signed)
 FMTS Interim Progress Note  S: I went with Dr. Cleotilde to evaluate patient at bedside s/p cardiac catheterization. Patient says he is feeling great. He is denying pain and SOB. Patient is tolerating PO intake. Neighbor was outside his room, we discussed that he would like a doctor to help guide the conversation for SNF disposition sometime tomorrow. He is worried about patient receiving proper placement and coverage by insurance.   O: BP 124/82 (BP Location: Right Arm)   Pulse 91   Temp 98.7 F (37.1 C) (Oral)   Resp 16   Wt 160 lb 7.9 oz (72.8 kg)   SpO2 95%   BMI 20.61 kg/m   General: disheveled, laying in bed, NAD, cooperative and polite Cardiac: RRR, no m/r/g Respiratory: normal work of breathing on RA Abd: non distended, non tender, soft, bowel sounds present Extremities: non edematous, symmetrical size and well-perfused LE. Multiple excoriations on bilat UE  A/P: AMS: Patient seems to be approaching baseline mentation - if not at baseline. He is much less agitated on exam today. Given his AMS improvement with IVF and positive THC on admission currently suspecting metabolic insult from dehydration/THC use. - PT/OT following, plan for SNF -delirium precautions  Heart Failure: Patient is doing well s/p cardiac catheterization, no complaints, denies pain - continue aspirin  81mg  daily  - Continue carvedilol  3.125 mg twice daily - Consider ARB for additional GDMT - cardiology following   Agitation overnight: Precaution for possible sundowning-> if not redirectable and believed to be a danger to himself or others - 1mg - 5mg  haldol IM PRN  Dan Inabinet, DO 03/12/2024, 8:44 PM PGY-1, [facility] Service pager [phone]

## 2024-03-12 NOTE — Assessment & Plan Note (Addendum)
 HTN-currently normotensive, added carvedilol  as above.  Consider losartan  for GDMT and blood pressure control if blood pressure can tolerate. Dementia-likely vascular dementia

## 2024-03-12 NOTE — Assessment & Plan Note (Addendum)
 At baseline.  Overall workup not revealing for cause.  Given improvement with IV fluids, and positive THC on admission suspect metabolic insult from dehydration/malnourishment and drug use. -PT/OT following, plan for SNF

## 2024-03-12 NOTE — Assessment & Plan Note (Addendum)
 Noted at admission. -Wound care: Gerhardt's Butt Cream 2 times a day to surrounding intact skin, will not follow.

## 2024-03-12 NOTE — Plan of Care (Signed)

## 2024-03-12 NOTE — Consult Note (Signed)
 Referring Physician: Isaiah Lai, DO/FMTS  Dan Salazar is an 63 y.o. male.                       Chief Complaint: Decreased LV systolic function  HPI: 63 years old white male with PMH of HTN, COPD, prostate cancer, anxiety and dementia has reduced LV systolic function. Patient most likely has ischemic heart disease. He prefers to undergo cardiac catheterization and intervention if needed. He denies chest pain but endorses shortness of breath and has reduced activity.  Past Medical History:  Diagnosis Date   Anemia    Anxiety    C. difficile diarrhea 2020   hx..resolved   COPD (chronic obstructive pulmonary disease) (HCC)    Dementia (HCC)    cogentive ability   Dyspnea    doe with heavy exertion   History of hypertension    no meds since 2020   Pneumonia 11/03/2017   pleural effusion also vats done   Prostate cancer (HCC)    RLS (restless legs syndrome)    Wears dentures full      Past Surgical History:  Procedure Laterality Date   CYSTOSCOPY N/A 10/25/2020   Procedure: CYSTOSCOPY FLEXIBLE;  Surgeon: Elisabeth Valli BIRCH, MD;  Location: [facility];  Service: Urology;  Laterality: N/A;  No seeds detected by Dr. Elisabeth   CYSTOSCOPY WITH BIOPSY N/A 05/23/2021   Procedure: CYSTOSCOPY WITH BLADDER BIOPSY WITH FULGURATION;  Surgeon: Elisabeth Valli BIRCH, MD;  Location: [facility];  Service: Urology;  Laterality: N/A;  75 MINS   FLEXIBLE BRONCHOSCOPY Left 11/03/2017   Procedure: FLEXIBLE BRONCHOSCOPY;  Surgeon: Kerrin Elspeth BROCKS, MD;  Location: [facility] OR;  Service: Thoracic;  Laterality: Left;   HERNIA REPAIR     Lap diagnostic inguinal hernia repar 04/30/17 Dr. Camellia Blush   INGUINAL HERNIA REPAIR Left 04/30/2017   Procedure: OPEN REPAIR MASSIVE INCARCERATED LEFT INGUINAL HERNIA REPAIR WITH MESH;  Surgeon: Blush Camellia, MD;  Location: [facility];  Service: General;  Laterality: Left;   INGUINAL HERNIA REPAIR  05/23/2018   INGUINAL HERNIA REPAIR Right 05/23/2018   Procedure:  OPEN RIGHT INGUINAL HERNIA REPAIR ERAS PATHWAY;  Surgeon: Blush Camellia, MD;  Location: [facility] OR;  Service: General;  Laterality: Right;   INSERTION OF MESH Left 04/30/2017   Procedure: INSERTION OF MESH;  Surgeon: Blush Camellia, MD;  Location: [facility];  Service: General;  Laterality: Left;   INSERTION OF MESH Right 05/23/2018   Procedure: INSERTION OF MESH;  Surgeon: Blush Camellia, MD;  Location: [facility] OR;  Service: General;  Laterality: Right;   LAPAROSCOPY N/A 04/30/2017   Procedure: LAPAROSCOPY DIAGNOSTIC;  Surgeon: Blush Camellia, MD;  Location: [facility];  Service: General;  Laterality: N/A;   OPEN REDUCTION INTERNAL FIXATION (ORIF) DISTAL RADIAL FRACTURE Left 03/02/2013   Procedure: OPEN REDUCTION INTERNAL FIXATION (ORIF) LEFT DISTAL RADIAL FRACTURE;  Surgeon: Alm DELENA Hummer, MD;  Location: [facility];  Service: Orthopedics;  Laterality: Left;   RADIOACTIVE SEED IMPLANT N/A 10/25/2020   Procedure: RADIOACTIVE SEED IMPLANT/BRACHYTHERAPY IMPLANT;  Surgeon: Elisabeth Valli BIRCH, MD;  Location: [facility];  Service: Urology;  Laterality: N/A;  90 MINS   SPACE OAR INSTILLATION N/A 10/25/2020   Procedure: SPACE OAR INSTILLATION;  Surgeon: Elisabeth Valli BIRCH, MD;  Location: [facility];  Service: Urology;  Laterality: N/A;   VIDEO ASSISTED THORACOSCOPY (VATS)/EMPYEMA Left 11/03/2017   Procedure: VIDEO ASSISTED THORACOSCOPY (VATS) FOR DRAINAGE OF PLEURAL EFFUSION;  Surgeon: Kerrin Elspeth  C, MD;  Location: [facility];  Service: Thoracic;  Laterality: Left;    Family History  Problem Relation Age of Onset   Lung cancer Father    Breast cancer Neg Hx    Prostate cancer Neg Hx    Colon cancer Neg Hx    Pancreatic cancer Neg Hx    Social History:  reports that he has never smoked. He has never used smokeless tobacco. He reports that he does not currently use alcohol. He reports that he does not currently use drugs after having used the following drugs:  Marijuana.  Allergies:  Allergies  Allergen Reactions   Hctz [Hydrochlorothiazide] Other (See Comments)    Severe electrolyte abnormalities    Medications Prior to Admission  Medication Sig Dispense Refill   acetaminophen  (TYLENOL ) 500 MG tablet Take 1,000 mg by mouth every 6 (six) hours as needed for mild pain.     budesonide -formoterol  (SYMBICORT ) 160-4.5 MCG/ACT inhaler Inhale 2 puffs into the lungs 2 (two) times daily. (Patient taking differently: Inhale 2 puffs into the lungs 2 (two) times daily as needed (wheezing/shortness of breath).) 1 each 6   donepezil  (ARICEPT ) 10 MG tablet Take half tablet (5 mg) daily for 2 weeks, then increase to the full tablet at 10 mg daily (Patient taking differently: Take 10 mg by mouth daily.) 30 tablet 11   tamsulosin  (FLOMAX ) 0.4 MG CAPS capsule Take 0.4 mg by mouth at bedtime.     vitamin B-12 (CYANOCOBALAMIN ) 1000 MCG tablet Take 1,000 mcg by mouth in the morning and at bedtime.      Results for orders placed or performed during the hospital encounter of 03/10/24 (from the past 48 hours)  Comprehensive metabolic panel     Status: Abnormal   Collection Time: 03/10/24  1:26 PM  Result Value Ref Range   Sodium 132 (L) 135 - 145 mmol/L   Potassium 4.5 3.5 - 5.1 mmol/L    Comment: HEMOLYSIS AT THIS LEVEL MAY AFFECT RESULT   Chloride 95 (L) 98 - 111 mmol/L   CO2 17 (L) 22 - 32 mmol/L   Glucose, Bld 102 (H) 70 - 99 mg/dL    Comment: Glucose reference range applies only to samples taken after fasting for at least 8 hours.   BUN 30 (H) 8 - 23 mg/dL   Creatinine, Ser 8.23 (H) 0.61 - 1.24 mg/dL   Calcium  9.2 8.9 - 10.3 mg/dL   Total Protein 6.7 6.5 - 8.1 g/dL   Albumin  3.8 3.5 - 5.0 g/dL   AST 18 15 - 41 U/L    Comment: HEMOLYSIS AT THIS LEVEL MAY AFFECT RESULT   ALT 21 0 - 44 U/L    Comment: HEMOLYSIS AT THIS LEVEL MAY AFFECT RESULT   Alkaline Phosphatase 61 38 - 126 U/L   Total Bilirubin 2.3 (H) 0.0 - 1.2 mg/dL    Comment: HEMOLYSIS AT THIS  LEVEL MAY AFFECT RESULT   GFR, Estimated 42 (L) >60 mL/min    Comment: (NOTE) Calculated using the CKD-EPI Creatinine Equation (2021)    Anion gap 20 (H) 5 - 15    Comment: Performed at [facility], [address] [place], [place] [address]  CBC     Status: Abnormal   Collection Time: 03/10/24  1:26 PM  Result Value Ref Range   WBC 11.1 (H) 4.0 - 10.5 K/uL   RBC 4.78 4.22 - 5.81 MIL/uL   Hemoglobin 16.8 13.0 - 17.0 g/dL   HCT 52.7 60.9 - 47.9 %  MCV 98.7 80.0 - 100.0 fL   MCH 35.1 (H) 26.0 - 34.0 pg   MCHC 35.6 30.0 - 36.0 g/dL   RDW 87.1 88.4 - 84.4 %   Platelets 150 150 - 400 K/uL   nRBC 0.0 0.0 - 0.2 %    Comment: Performed at [facility], [address] [place], [place] [address]  Resp panel by RT-PCR (RSV, Flu A&B, Covid) Anterior Nasal Swab     Status: None   Collection Time: 03/10/24  1:39 PM   Specimen: Anterior Nasal Swab  Result Value Ref Range   SARS Coronavirus 2 by RT PCR NEGATIVE NEGATIVE   Influenza A by PCR NEGATIVE NEGATIVE   Influenza B by PCR NEGATIVE NEGATIVE    Comment: (NOTE) The Xpert Xpress SARS-CoV-2/FLU/RSV plus assay is intended as an aid in the diagnosis of influenza from Nasopharyngeal swab specimens and should not be used as a sole basis for treatment. Nasal washings and aspirates are unacceptable for Xpert Xpress SARS-CoV-2/FLU/RSV testing.  Fact Sheet for Patients: BloggerCourse.com  Fact Sheet for Healthcare Providers: SeriousBroker.it  This test is not yet approved or cleared by the [place]  FDA and has been authorized for detection and/or diagnosis of SARS-CoV-2 by FDA under an Emergency Use Authorization (EUA). This EUA will remain in effect (meaning this test can be used) for the duration of the COVID-19 declaration under Section 564(b)(1) of the Act, 21 U.S.C. section 360bbb-3(b)(1), unless the authorization is terminated or revoked.     Resp Syncytial Virus  by PCR NEGATIVE NEGATIVE    Comment: (NOTE) Fact Sheet for Patients: BloggerCourse.com  Fact Sheet for Healthcare Providers: SeriousBroker.it  This test is not yet approved or cleared by the [place]  FDA and has been authorized for detection and/or diagnosis of SARS-CoV-2 by FDA under an Emergency Use Authorization (EUA). This EUA will remain in effect (meaning this test can be used) for the duration of the COVID-19 declaration under Section 564(b)(1) of the Act, 21 U.S.C. section 360bbb-3(b)(1), unless the authorization is terminated or revoked.  Performed at [facility], [address], [place], [place] [address]   Urinalysis, Routine w reflex microscopic -Urine, Clean Catch     Status: Abnormal   Collection Time: 03/10/24  3:50 PM  Result Value Ref Range   Color, Urine YELLOW YELLOW   APPearance HAZY (A) CLEAR   Specific Gravity, Urine 1.020 1.005 - 1.030   pH 5.0 5.0 - 8.0   Glucose, UA NEGATIVE NEGATIVE mg/dL   Hgb urine dipstick NEGATIVE NEGATIVE   Bilirubin Urine NEGATIVE NEGATIVE   Ketones, ur 20 (A) NEGATIVE mg/dL   Protein, ur NEGATIVE NEGATIVE mg/dL   Nitrite NEGATIVE NEGATIVE   Leukocytes,Ua NEGATIVE NEGATIVE    Comment: Performed at [facility], [address] [place], [place] [address]  Ethanol     Status: None   Collection Time: 03/10/24 10:16 PM  Result Value Ref Range   Alcohol, Ethyl (B) <15 <15 mg/dL    Comment: (NOTE) For medical purposes only. Performed at [facility], [address] [place], [place] [address]   HIV Antibody (routine testing w rflx)     Status: None   Collection Time: 03/10/24 10:16 PM  Result Value Ref Range   HIV Screen 4th Generation wRfx Non Reactive Non Reactive    Comment: Performed at [facility], [address], [place], [place] [address]  TSH     Status: None   Collection  Time: 03/10/24 10:16 PM  Result Value Ref Range   TSH 1.126 0.350  - 4.500 uIU/mL    Comment: Performed by a 3rd Generation assay with a functional sensitivity of <=0.01 uIU/mL. Performed at [facility], [address], [place], [place] [address]   Troponin I (High Sensitivity)     Status: None   Collection Time: 03/10/24 10:16 PM  Result Value Ref Range   Troponin I (High Sensitivity) 11 <18 ng/L    Comment: (NOTE) Elevated high sensitivity troponin I (hsTnI) values and significant  changes across serial measurements may suggest ACS but many other  chronic and acute conditions are known to elevate hsTnI results.  Refer to the Links section for chest pain algorithms and additional  guidance. Performed at [facility], [address] [place], [place] [address]   Blood gas, venous     Status: Abnormal   Collection Time: 03/10/24 10:16 PM  Result Value Ref Range   pH, Ven 7.43 7.25 - 7.43   pCO2, Ven 46 44 - 60 mmHg   pO2, Ven <31 (LL) 32 - 45 mmHg    Comment: CRITICAL RESULT CALLED TO, READ BACK BY AND VERIFIED WITH: 1ST CALL ATTEMPT AT 2030 SECOND CALL ATTEMPT AT 2045 JANESE IVAR PRIDE RN [phone] M. ALAMANO    Bicarbonate 31.1 (H) 20.0 - 28.0 mmol/L   Acid-Base Excess 5.5 (H) 0.0 - 2.0 mmol/L   O2 Saturation 31.6 %   Patient temperature 36.2     Comment: Performed at [facility], [address] [place], [place] [address]  Basic metabolic panel     Status: Abnormal   Collection Time: 03/11/24 12:43 AM  Result Value Ref Range   Sodium 133 (L) 135 - 145 mmol/L   Potassium 3.6 3.5 - 5.1 mmol/L   Chloride 96 (L) 98 - 111 mmol/L   CO2 25 22 - 32 mmol/L   Glucose, Bld 139 (H) 70 - 99 mg/dL    Comment: Glucose reference range applies only to samples taken after fasting for at least 8 hours.   BUN 28 (H) 8 - 23 mg/dL   Creatinine, Ser 8.50 (H) 0.61 - 1.24 mg/dL   Calcium  8.5 (L) 8.9 - 10.3 mg/dL   GFR, Estimated 52 (L) >60 mL/min    Comment: (NOTE) Calculated using the CKD-EPI Creatinine Equation (2021)    Anion gap  12 5 - 15    Comment: Performed at [facility], [address], [place], [place] [address]  CBC     Status: Abnormal   Collection Time: 03/11/24 12:43 AM  Result Value Ref Range   WBC 7.3 4.0 - 10.5 K/uL   RBC 3.73 (L) 4.22 - 5.81 MIL/uL   Hemoglobin 13.1 13.0 - 17.0 g/dL   HCT 62.9 (L) 60.9 - 47.9 %   MCV 99.2 80.0 - 100.0 fL   MCH 35.1 (H) 26.0 - 34.0 pg   MCHC 35.4 30.0 - 36.0 g/dL   RDW 87.2 88.4 - 84.4 %   Platelets 99 (L) 150 - 400 K/uL    Comment: SPECIMEN CHECKED FOR CLOTS PLATELET COUNT CONFIRMED BY SMEAR REPEATED TO VERIFY Immature Platelet Fraction may be clinically indicated, consider ordering this additional test [id number]    nRBC 0.0 0.0 - 0.2 %    Comment: Performed at [facility], [address] [place], [place] [address]  Troponin I (High Sensitivity)     Status: None   Collection  Time: 03/11/24 12:43 AM  Result Value Ref Range   Troponin I (High Sensitivity) 12 <18 ng/L    Comment: (NOTE) Elevated high sensitivity troponin I (hsTnI) values and significant  changes across serial measurements may suggest ACS but many other  chronic and acute conditions are known to elevate hsTnI results.  Refer to the Links section for chest pain algorithms and additional  guidance. Performed at [facility], [address] [place], [place] [address]   Magnesium      Status: None   Collection Time: 03/12/24  2:30 AM  Result Value Ref Range   Magnesium  1.8 1.7 - 2.4 mg/dL    Comment: Performed at [facility], [address], [place], [place] [address]  Phosphorus     Status: None   Collection Time: 03/12/24  2:30 AM  Result Value Ref Range   Phosphorus 2.5 2.5 - 4.6 mg/dL    Comment: Performed at [facility], [address], [place], [place] [address]  Basic metabolic panel     Status: Abnormal   Collection Time: 03/12/24  2:30 AM  Result Value Ref Range   Sodium 135 135 - 145 mmol/L   Potassium 3.5 3.5 - 5.1 mmol/L   Chloride 97  (L) 98 - 111 mmol/L   CO2 29 22 - 32 mmol/L   Glucose, Bld 105 (H) 70 - 99 mg/dL    Comment: Glucose reference range applies only to samples taken after fasting for at least 8 hours.   BUN 24 (H) 8 - 23 mg/dL   Creatinine, Ser 8.39 (H) 0.61 - 1.24 mg/dL   Calcium  8.6 (L) 8.9 - 10.3 mg/dL   GFR, Estimated 48 (L) >60 mL/min    Comment: (NOTE) Calculated using the CKD-EPI Creatinine Equation (2021)    Anion gap 9 5 - 15    Comment: Performed at [facility], [address] [place], [place] [address]   ECHOCARDIOGRAM COMPLETE Result Date: 03/11/2024    ECHOCARDIOGRAM REPORT   Patient Name:   Franck Vinal Date of Exam: 03/11/2024 Medical Rec #:  [id number]     Height:       74.0 in Accession #:    [id number]    Weight:       198.0 lb Date of Birth:  06/18/1961     BSA:          2.164 m Patient Age:    63 years      BP:           107/80 mmHg Patient Gender: M             HR:           65 bpm. Exam Location:  Inpatient Procedure: 2D Echo, Cardiac Doppler and Color Doppler (Both Spectral and Color            Flow Doppler were utilized during procedure). Indications:    Dyspnea R06.00  History:        Patient has prior history of Echocardiogram examinations, most                 recent 09/29/2016.  Sonographer:    Tinnie Gosling RDCS Referring Phys: GLADIS CHURCH IMPRESSIONS  1. Left ventricular ejection fraction, by estimation, is 40 to 45%. The left ventricle has mildly decreased function. The left ventricle demonstrates regional wall motion abnormalities (see scoring diagram/findings for description). There is mild concentric left ventricular hypertrophy. Left ventricular diastolic parameters are consistent  with Grade I diastolic dysfunction (impaired relaxation).  2. Right ventricular systolic function is normal. The right ventricular size is normal.  3. The mitral valve is normal in structure. No evidence of mitral valve regurgitation. No evidence of mitral stenosis.  4. The aortic valve is normal in  structure. There is mild calcification of the aortic valve. Aortic valve regurgitation is moderate. Aortic valve sclerosis/calcification is present, without any evidence of aortic stenosis.  5. Aortic dilatation noted. There is borderline dilatation of the ascending aorta, measuring 39 mm. FINDINGS  Left Ventricle: Left ventricular ejection fraction, by estimation, is 40 to 45%. The left ventricle has mildly decreased function. The left ventricle demonstrates regional wall motion abnormalities. The left ventricular internal cavity size was normal in size. There is mild concentric left ventricular hypertrophy. Left ventricular diastolic parameters are consistent with Grade I diastolic dysfunction (impaired relaxation).  LV Wall Scoring: The posterior wall, basal anterolateral segment, and basal inferior segment are hypokinetic. Right Ventricle: The right ventricular size is normal. No increase in right ventricular wall thickness. Right ventricular systolic function is normal. Left Atrium: Left atrial size was normal in size. Right Atrium: Right atrial size was normal in size. Pericardium: There is no evidence of pericardial effusion. Mitral Valve: The mitral valve is normal in structure. No evidence of mitral valve regurgitation. No evidence of mitral valve stenosis. Tricuspid Valve: The tricuspid valve is normal in structure. Tricuspid valve regurgitation is not demonstrated. No evidence of tricuspid stenosis. Aortic Valve: The aortic valve is normal in structure. There is mild calcification of the aortic valve. Aortic valve regurgitation is moderate. Aortic valve sclerosis/calcification is present, without any evidence of aortic stenosis. Pulmonic Valve: The pulmonic valve was not well visualized. Pulmonic valve regurgitation is not visualized. No evidence of pulmonic stenosis. Aorta: Aortic dilatation noted. There is borderline dilatation of the ascending aorta, measuring 39 mm. Venous: The inferior vena cava was  not well visualized. IAS/Shunts: No atrial level shunt detected by color flow Doppler.  LEFT VENTRICLE PLAX 2D LVIDd:         4.90 cm   Diastology LVIDs:         3.00 cm   LV e' medial:    6.85 cm/s LV PW:         1.20 cm   LV E/e' medial:  6.9 LV IVS:        1.10 cm   LV e' lateral:   4.57 cm/s LVOT diam:     2.10 cm   LV E/e' lateral: 10.3 LV SV:         67 LV SV Index:   31 LVOT Area:     3.46 cm  RIGHT VENTRICLE RV S prime:     9.68 cm/s TAPSE (M-mode): 1.3 cm LEFT ATRIUM           Index LA diam:      3.30 cm 1.52 cm/m LA Vol (A4C): 10.6 ml 4.90 ml/m  AORTIC VALVE LVOT Vmax:   98.20 cm/s LVOT Vmean:  66.300 cm/s LVOT VTI:    0.193 m  AORTA Ao Root diam: 3.60 cm Ao Asc diam:  3.90 cm MITRAL VALVE MV Area (PHT): 2.65 cm    SHUNTS MV Decel Time: 286 msec    Systemic VTI:  0.19 m MV E velocity: 47.20 cm/s  Systemic Diam: 2.10 cm MV A velocity: 69.20 cm/s MV E/A ratio:  0.68 Toribio Fuel MD Electronically signed by Toribio Fuel MD Signature Date/Time: 03/11/2024/3:50:24 PM  Final    DG Abd 1 View Result Date: 03/10/2024 CLINICAL DATA:  Constipation EXAM: ABDOMEN - 1 VIEW COMPARISON:  None Available. FINDINGS: There is gaseous distention of the colon to the level the rectum. Colon is borderline dilated. No dilated small bowel. There is also gaseous distention of the stomach. Stool burden is low. Prostate radiotherapy seeds are present. No suspicious calcifications are identified. There is a small left pleural effusion. Lung bases are otherwise clear. IMPRESSION: 1. Gaseous distention of the colon to the level of the rectum. Stool burden is low. 2. Small left pleural effusion. Electronically Signed   By: Greig Pique M.D.   On: 03/10/2024 21:57   MR BRAIN WO CONTRAST Result Date: 03/10/2024 CLINICAL DATA:  Deaf initial evaluation for acute neuro deficit, stroke suspected. EXAM: MRI HEAD WITHOUT CONTRAST TECHNIQUE: Multiplanar, multiecho pulse sequences of the brain and surrounding structures were  obtained without intravenous contrast. COMPARISON:  Prior CT from earlier the same day and MRI from 05/04/2021. FINDINGS: Brain: Examination degraded by motion artifact. Generalized age-related cerebral atrophy. Patchy and confluent T2/FLAIR hyperintensity involving the periventricular and deep white matter of both cerebral hemispheres as well as the pons, consistent with chronic small vessel ischemic disease, moderately advanced in nature. Multiple superimposed remote lacunar infarcts present about the bilateral basal ganglia, thalami, and pons. Few small remote cerebellar infarcts noted. No evidence for acute or subacute ischemia. Gray-white matter differentiation maintained. No acute intracranial hemorrhage. Few punctate chronic micro hemorrhages noted at the right parietal convexity. No mass lesion, midline shift or mass effect. Ventricular prominence related global parenchymal volume loss of hydrocephalus. No extra-axial fluid collection. Pituitary gland grossly within normal limits. Vascular: Major intracranial vascular flow voids are maintained. Skull and upper cervical spine: Craniocervical junction within normal limits. Bone marrow signal intensity normal. 2.3 cm partially calcified soft tissue nodule again noted at the left parietal scalp (series 5, image 25). Sinuses/Orbits: Globes orbital soft tissues within normal limits. Paranasal sinuses are largely clear. Small right mastoid effusion noted. Other: None. IMPRESSION: 1. No acute intracranial abnormality. 2. Age-related cerebral atrophy with moderately advanced chronic microvascular ischemic disease, with multiple remote lacunar infarcts involving the bilateral basal ganglia, thalami, and pons. 3. 2.3 cm nodular lesion at the left parietal scalp, slightly increased as compared to prior MRI from 05/04/2021. Findings indeterminate, and could reflect a sebaceous or trichilemmal cyst. Electronically Signed   By: Morene Hoard M.D.   On: 03/10/2024  19:12   CT Head Wo Contrast Result Date: 03/10/2024 EXAM: CT HEAD WITHOUT CONTRAST 03/10/2024 02:10:04 PM TECHNIQUE: CT of the head was performed without the administration of intravenous contrast. Automated exposure control, iterative reconstruction, and/or weight based adjustment of the mA/kV was utilized to reduce the radiation dose to as low as reasonably achievable. COMPARISON: MRI of the head dated 05/04/2001. CLINICAL HISTORY: Mental status change, unknown cause. Triage notes; Per EMS, Pt, from home, presents due to AMS and failure to thrive. Pt denies pain. Pt lives alone. Pt's friend reported to EMS that he is more cantankerous and less jovial than usual. Also, his congested cough is worse than normal and he hasn't been eating or drinking well. Hx of dementia. Pt given en route. FINDINGS: BRAIN AND VENTRICLES: Moderate generalized cerebral and cerebellar volume loss present. Extensive cerebral white matter disease present. Chronic lacunar infarcts within the basal ganglia bilaterally, worse on the left. Chronic lacunar infarcts within the thalami bilaterally. No acute hemorrhage. Gray-white differentiation is preserved. No hydrocephalus. No extra-axial collection.  No mass effect or midline shift. ORBITS: No acute abnormality. SINUSES: No acute abnormality. SOFT TISSUES AND SKULL: Ovoid, circumscribed and partially calcified lesion demonstrated in the left parietal scalp, measuring approximately 2.2 x 1.1 x 2.2 cm, likely representing a sebaceous cyst. No acute soft tissue abnormality. No skull fracture. IMPRESSION: 1. No acute intracranial abnormality. 2. Moderate generalized cerebral and cerebellar volume loss, extensive cerebral white matter disease, and chronic lacunar infarcts within the basal ganglia bilaterally (worse on the left) and thalami bilaterally. 3. Ovoid, circumscribed, and partially calcified lesion in the left parietal scalp, likely representing a sebaceous cyst. Electronically  signed by: evalene berrigan 03/10/2024 02:54 PM EDT RP Workstation: [id number]   DG Chest Port 1 View Result Date: 03/10/2024 CLINICAL DATA:  Cough.  Altered mental status.  Failure to thrive. EXAM: PORTABLE CHEST 1 VIEW COMPARISON:  02/13/2021 FINDINGS: The cardiomediastinal contours are normal. Unchanged left lung base scarring. Pulmonary vasculature is normal. No consolidation, pleural effusion, or pneumothorax. No acute osseous abnormalities are seen. IMPRESSION: Unchanged left lung base scarring. No acute findings. Electronically Signed   By: Andrea Gasman M.D.   On: 03/10/2024 14:40    Review Of Systems Constitutional: No fever, chills, chronic weight loss. Eyes: No vision change, wears glasses. No discharge or pain. Ears: No hearing loss, No tinnitus. Respiratory: No asthma, Positive COPD, pneumonias, shortness of breath. No hemoptysis. Cardiovascular: No chest pain, palpitation, leg edema. Gastrointestinal: No nausea, vomiting, diarrhea, constipation. No GI bleed. No hepatitis. Genitourinary: No dysuria, hematuria, kidney stone. No incontinance. Neurological: No headache, stroke, seizures.  Psychiatry: No psych facility admission for anxiety, depression, suicide. No detox. Positive for anxiety and dementia. Skin: No rash. Musculoskeletal: No joint pain, fibromyalgia. No neck pain, back pain. Lymphadenopathy: No lymphadenopathy. Hematology: No anemia or easy bruising.   Blood pressure 118/74, pulse 76, temperature 97.8 F (36.6 C), resp. rate 18, weight 72.8 kg, SpO2 97%. Body mass index is 20.61 kg/m. General appearance: alert, cooperative, appears stated age and no distress Head: Normocephalic, atraumatic. Eyes: Brown/Blue/Hazel eyes, pink conjunctiva, corneas clear. PERRL, EOM's intact. Neck: No adenopathy, no carotid bruit, no JVD, supple, symmetrical, trachea midline and thyroid  not enlarged. Resp: Clear to auscultation bilaterally. Cardio: Regular rate and rhythm, S1, S2  normal, II/VI systolic murmur, no click, rub or gallop GI: Soft, non-tender; bowel sounds normal; no organomegaly. Extremities: No edema, cyanosis or clubbing. Skin: Warm and dry.  Neurologic: Alert and oriented X 3, normal strength. Normal coordination and gait.  Assessment/Plan Chronic systolic left heart failure Ischemic heart disease R/O CAD HTN CKD, IIIa COPD Dementia  Plan: R + L heart catheterization. Add low dose Carvedilol . Patient wants to proceed with cardiac cath in presence of his nurse.   Time spent: Review of old records, Lab, x-rays, EKG, other cardiac tests, examination, discussion with patient/Nurse/POA over 70 minutes.  Salena GORMAN Negri, MD  03/12/2024, 10:16 AM

## 2024-03-12 NOTE — TOC Initial Note (Addendum)
 Transition of Care [facility]) - Initial/Assessment Note    Patient Details  Name: Dan Salazar MRN: [id number] Date of Birth: November 02, 1960  [facility]) CM/SW Contact:    Shinita Mac A [place], LCSW Phone Number: 03/12/2024, 12:03 PM  Clinical Narrative:                  Pt is currently disoriented, oriented x1, admitted with altered mental status. CSW spoke with pt's friend and HCPOA, stated was scanned in the chart, [name] to complete assessment. He informed CSW that pt is his neighbor and that he has been assisting him over the years and that pt has no living relatives.  CSW informed him of the recommendation for SNF, he was agreeable, possible interest in long term at facility as well. Discussed interest in starting long term medicaid application, CSW to reach out to financial services to provide assistance. Notified by Phill Molt that pt has [place] [employer] Medicaid and possible LTC benefits. Stated CSW can reach out if further questions regarding. CSW to update [name] regarding information and provide contact to DSS [facility] to get specifics if needed.  CSW sent SNF referrals, bed offers pending.   Pt has traditional Medicare, 3 night stay required to qualify for SNF.   CSW will continue to follow.   Expected Discharge Plan: Skilled Nursing Facility Barriers to Discharge: Continued Medical Work up, SNF Pending bed offer   Patient Goals and CMS Choice Patient states their goals for this hospitalization and ongoing recovery are:: Pt's HCPOA [name] stated for pt to go to rehab and get better CMS Medicare.gov Compare Post Acute Care list provided to:: Patient Represenative (must comment) (Pt's HCPOA, [name])        Expected Discharge Plan and Services     Post Acute Care Choice: Skilled Nursing Facility Living arrangements for the past 2 months: Single Family Home                                      Prior Living Arrangements/Services Living arrangements for the  past 2 months: Single Family Home Lives with:: Self Patient language and need for interpreter reviewed:: Yes        Need for Family Participation in Patient Care: Yes (Comment) Care giver support system in place?: Yes (comment) (pt's friend, Advertising account planner)   Criminal Activity/Legal Involvement Pertinent to Current Situation/Hospitalization: No - Comment as needed  Activities of Daily Living   ADL Screening (condition at time of admission) Independently performs ADLs?: Yes (appropriate for developmental age) Is the patient deaf or have difficulty hearing?: No Does the patient have difficulty seeing, even when wearing glasses/contacts?: No Does the patient have difficulty concentrating, remembering, or making decisions?: Yes  Permission Sought/Granted Permission sought to share information with : Family Supports Permission granted to share information with : Yes, Release of Information Signed        Permission granted to share info w Relationship: Madeleine Clause  Permission granted to share info w Contact Information: Madeleine Clause, [phone]  Emotional Assessment Appearance:: Disheveled Attitude/Demeanor/Rapport: Inconsistent Affect (typically observed): Flat Orientation: : Oriented to Self Alcohol / Substance Use: Illicit Drugs (THC positive) Psych Involvement: No (comment)  Admission diagnosis:  Dehydration [E86.0] Altered mental status [R41.82] Non-productive cough [R05.8] Failure to thrive in adult [R62.7] Generalized weakness [R53.1] History of dementia [Z86.59] Stage 3b chronic kidney disease (HCC) [N18.32] Patient Active Problem List   Diagnosis Date  Noted   Heart failure (HCC) 03/12/2024   Altered mental status 03/10/2024   CKD3b 03/10/2024   Cough 03/10/2024   Malnourished (HCC) 03/10/2024   Pressure injury of sacral region, stage 2 (HCC) 03/10/2024   Chronic health problem 03/10/2024   Malignant neoplasm of prostate (HCC) 07/15/2020   Chronic obstructive lung  disease (HCC) 03/08/2020   S/P right inguinal hernia repair 05/23/2018   Difficult intravenous access    Encounter for intubation    Acute respiratory failure with hypoxia (HCC)    Pneumonia    Atelectasis    Pleural effusion on left 10/31/2017   Incarcerated inguinal hernia 04/30/2017   Hypokalemia 09/28/2016   Hyponatremia    Metabolic alkalosis    Essential hypertension    PCP:  Delores Delorise Lunger, FNP Pharmacy:   [facility] [phone] [place], [place] - [address] [place] [address] Phone: [phone] Fax: [phone]     Social Drivers of Health (SDOH) Social History: SDOH Screenings   Food Insecurity: Patient Unable To Answer (03/11/2024)  Housing: Unknown (03/11/2024)  Transportation Needs: Patient Declined (03/11/2024)  Utilities: Patient Declined (03/11/2024)  Social Connections: Patient Declined (03/11/2024)  Tobacco Use: Low Risk  (03/10/2024)   SDOH Interventions:     Readmission Risk Interventions     No data to display

## 2024-03-12 NOTE — Assessment & Plan Note (Addendum)
 History of COPD, not take inhalers at home. Breathing on room air comfortably on exam today. Chest x-ray no acute findings. -DuoNebs every 4 hours as needed

## 2024-03-12 NOTE — Progress Notes (Signed)
 Pt recd with Rfa/rfv sheaths (41f rfv, 5 f rfa) intact- level 0- 5 fr sheath (A)- removed - manual pressure held x 30 min- 7 fr rfv sheath removed at 1530 manula pressure held x 15 min- hemostasis achieved. Dry , sterile dressing applied to rfa/rfv site- r dp +2 noted r dp edema. Pt v/s/s 117/75 hr 67 s throughout sheath hold. Pt. Instructed and hand guided to rfa/rfv dressing- pt advised if he laughs, coughs, sneezes. Or bears down with abd. To hold pressure on rfa/ rfv site. Pt return demonstrated to validate understanding of instructions. Pt. Advised if he feel wet/ warm sensation at groin or between legs to call for help- immediately- that means he is bleeding. Pt verbally validates understanding of these instructions as he repeats them back to me. - awaiting bed assignment.

## 2024-03-12 NOTE — Progress Notes (Signed)
 Daily Progress Note Intern Pager: [phone]  Patient name: Dan Salazar Medical record number: [id number] Date of birth: 30-Jan-1961 Age: 63 y.o. Gender: male  Primary Care Provider: Delores Delorise Lunger, FNP Consultants: Nutrition Code Status: DNR- confirmed by patient's power of attorney at bedside, who provided us  POA paper-work   Pt Overview and Major Events to Date:  8/12: Admitted 8/14: Heart cath  Assessment and Plan: Dan Salazar is a 63 y.o. male presenting who presented with altered mental status and acute cough.  He is now at baseline, cough improved.  Echocardiogram with worsening heart failure, cardiology on board.  PT/OT recommending SNF, social work on board.  Assessment & Plan Altered mental status At baseline.  Overall workup not revealing for cause.  Given improvement with IV fluids, and positive THC on admission suspect metabolic insult from dehydration/malnourishment and drug use. -PT/OT following, plan for SNF Heart failure (HCC) Echocardiogram: Ejection fraction 40-45%, with left ventricle regional wall abnormalities.  Cardiology consulted, who recommended heart cath today.  Heart cath with mild stenosis, cardiology recommending aspirin .  Started on low-dose carvedilol .  Recommend adding ARB once renal function improves. - Cardiology consulted, appreciate recommendations - Start aspirin  81 mg once he is able to p.o. after cath - Continue carvedilol  3.125 mg twice daily -Consider ARB for additional GDMT Cough History of COPD, not take inhalers at home. Breathing on room air comfortably on exam today. Chest x-ray no acute findings. -DuoNebs every 4 hours as needed Malnourished (HCC) Tolerating regular diet. -Nutrition consulted and pending -Regular diet Pressure injury of sacral region, stage 2 (HCC) Noted at admission. -Wound care: Gerhardt's Butt Cream 2 times a day to surrounding intact skin, will not follow. Chronic health problem HTN-currently  normotensive, added carvedilol  as above.  Consider losartan  for GDMT and blood pressure control if blood pressure can tolerate. Dementia-likely vascular dementia  FEN/GI: Regular diet PPx: Lovenox  Dispo:SNF pending recovery from heart cath and bed offer  Subjective:  63 year old male with improving AMS. He is jovial and conversational. He is agreeable to seeing cardiologist for his heart failure. Discussed possibility of SNF.  Objective: Temp:  [97.8 F (36.6 C)-98.3 F (36.8 C)] 97.8 F (36.6 C) (08/14 0839) Pulse Rate:  [76-105] 76 (08/14 0839) Resp:  [13-21] 18 (08/14 0839) BP: (102-149)/(64-92) 118/74 (08/14 0839) SpO2:  [94 %-99 %] 97 % (08/14 0839) Weight:  [72.8 kg] 72.8 kg (08/14 0900) Physical Exam: General: Laying on bed, conversational, in no acute distress, jovial Cardiovascular: RRR, mild distal heart sounds Respiratory: CTAB breathing comfortably Abdomen: No TTP, soft Extremities: No deformities  Laboratory: Most recent CBC Lab Results  Component Value Date   WBC 7.3 03/11/2024   HGB 13.1 03/11/2024   HCT 37.0 (L) 03/11/2024   MCV 99.2 03/11/2024   PLT 99 (L) 03/11/2024   Most recent BMP    Latest Ref Rng & Units 03/12/2024    2:30 AM  BMP  Glucose 70 - 99 mg/dL 894   BUN 8 - 23 mg/dL 24   Creatinine 9.38 - 1.24 mg/dL 8.39   Sodium 864 - 854 mmol/L 135   Potassium 3.5 - 5.1 mmol/L 3.5   Chloride 98 - 111 mmol/L 97   CO2 22 - 32 mmol/L 29   Calcium  8.9 - 10.3 mg/dL 8.6     Other pertinent labs Gluc 139>105 Cr 1.49>1.60 Phos 2.5 Mag 1.8 GFR 52>48  Trop 11>12    Imaging/Diagnostic Tests: Radiologist impressions:   Chest x-ray: 1. Unchanged  left lung base scarring. No acute findings. Comparison 01/2021   Head CT w/o contrast 1. No acute intracranial abnormality. 2. Moderate generalized cerebral and cerebellar volume loss, extensive cerebral white matter disease, and chronic lacunar infarcts within the basal ganglia bilaterally (worse on  the left) and thalami bilaterally. 3. Ovoid, circumscribed, and partially calcified lesion in the left parietal scalp, likely representing a sebaceous cyst.   MR Brain w/o contrast 1. No acute intracranial abnormality. 2. Age-related cerebral atrophy with moderately advanced chronic microvascular ischemic disease, with multiple remote lacunar infarcts involving the bilateral basal ganglia, thalami, and pons. 3. 2.3 cm nodular lesion at the left parietal scalp, slightly increased as compared to prior MRI from 05/04/2021. Findings indeterminate, and could reflect a sebaceous or trichilemmal cyst.   KUB 1. Gaseous distention of the colon to the level of the rectum. Stool burden is low. 2. Small left pleural effusion.   [name] [name], Medical Student 03/12/2024, 10:25 AM  I was personally present and performed or re-performed the history, physical exam and medical decision making activities of this service and have verified that the service and findings are accurately documented in the student's note.  Gladis Church, DO                  03/12/2024, 6:03 PM

## 2024-03-12 NOTE — Assessment & Plan Note (Addendum)
 Tolerating regular diet. -Nutrition consulted and pending -Regular diet

## 2024-03-12 NOTE — Progress Notes (Addendum)
 Educated pt r/t importance of keeping right leg straight s/p sheath pull. Pt refuses teachings and continues to bend right leg. Site still level 0 at this time. Notified supervisor Lucienne, Charity fundraiser.

## 2024-03-12 NOTE — Assessment & Plan Note (Addendum)
 Echocardiogram: Ejection fraction 40-45%, with left ventricle regional wall abnormalities.  Cardiology consulted, who recommended heart cath today.  Heart cath with mild stenosis, cardiology recommending aspirin .  Started on low-dose carvedilol .  Recommend adding ARB once renal function improves. - Cardiology consulted, appreciate recommendations - Start aspirin  81 mg once he is able to p.o. after cath - Continue carvedilol  3.125 mg twice daily -Consider ARB for additional GDMT

## 2024-03-12 NOTE — Progress Notes (Signed)
 Initial Nutrition Assessment  DOCUMENTATION CODES:   Severe malnutrition in context of chronic illness  INTERVENTION:  -Continue regular menu, regular texture, thin liquids diet with assist once back from procedure -Add Ensure Plus High Protein TID -Continue MVI, Thiamine  -Continue to monitor for refeeding syndrome   NUTRITION DIAGNOSIS:   Severe Malnutrition related to acute illness, chronic illness as evidenced by moderate fat depletion, moderate muscle depletion.  GOAL:   Patient will meet greater than or equal to 90% of their needs  MONITOR:   PO intake, Weight trends, Supplement acceptance, Labs, Skin  REASON FOR ASSESSMENT:   Consult Poor PO, Assessment of nutrition requirement/status  ASSESSMENT:   Hx heart failure, CKD3b, HTN, COPD, prostate cancer, dementia who presents with worsening altered mental status.  Spoke to pt at bedside. Pt with AMS, dementia. Does not answer all questions asked. Pt focused on wanting to go home and asks repetitive questions pertaining to this. Pt does deny n/v/c/d or chewing/swallowing difficulties. Last BM unknown. Pt with no recent weight hx. NFPE completed (see below), pt does meet criteria for severe protein calorie malnutrition. Unable to assess all areas d/t pt not wanting to follow commands. Pt with one documented meal intake at 100%. NPO currently for heart cath, return to regular diet per MD.   Pt living situation is home alone. Neighbor/MPOA checks on pt regularly and helps him manage medications/finances. Neighbor unable to check on him as frequently this week and found pt at home disheveled, incontinent, AMS. Per chart, d/c plan possibly to LTC. Suspect poor PO intake at home r/t AMS, difficulty in preparing meals. Pt is at risk for refeeding as PO intake unknown and most likely inadequate prior to admit.   Pt denies additional questions/concerns/needs at this time, will continue to monitor, RDN available prn.   NUTRITION -  FOCUSED PHYSICAL EXAM:  Flowsheet Row Most Recent Value  Orbital Region Moderate depletion  Upper Arm Region Mild depletion  Thoracic and Lumbar Region Unable to assess  Buccal Region Moderate depletion  Temple Region Moderate depletion  Clavicle Bone Region Moderate depletion  Clavicle and Acromion Bone Region Moderate depletion  Scapular Bone Region Unable to assess  Dorsal Hand Mild depletion  Patellar Region Moderate depletion  Anterior Thigh Region Moderate depletion  Posterior Calf Region Moderate depletion  Edema (RD Assessment) None  Hair Reviewed  Eyes Reviewed  Mouth Unable to assess  Skin Reviewed  Nails Reviewed    Diet Order:   Diet Order             Diet NPO time specified  Diet effective now                   EDUCATION NEEDS:   Not appropriate for education at this time  Skin:  Skin Assessment: Reviewed RN Assessment  Last BM:  PTA  Height:   Ht Readings from Last 1 Encounters:  06/12/21 6' 2 (1.88 m)    Weight:   Wt Readings from Last 1 Encounters:  03/12/24 72.8 kg    BMI:  Body mass index is 20.61 kg/m.  Estimated Nutritional Needs:   Kcal:  1450-1850 kcal  Protein:  85-110 g  Fluid:  >/=1.5 L  Zurie Platas Daml-Budig, RDN, LDN Registered Dietitian Nutritionist RD Inpatient Contact Info in [place]

## 2024-03-13 ENCOUNTER — Encounter (HOSPITAL_COMMUNITY): Payer: Self-pay | Admitting: Cardiovascular Disease

## 2024-03-13 DIAGNOSIS — L89152 Pressure ulcer of sacral region, stage 2: Secondary | ICD-10-CM | POA: Diagnosis not present

## 2024-03-13 DIAGNOSIS — R4182 Altered mental status, unspecified: Secondary | ICD-10-CM | POA: Diagnosis not present

## 2024-03-13 DIAGNOSIS — E43 Unspecified severe protein-calorie malnutrition: Secondary | ICD-10-CM

## 2024-03-13 DIAGNOSIS — R627 Adult failure to thrive: Secondary | ICD-10-CM

## 2024-03-13 DIAGNOSIS — Z8659 Personal history of other mental and behavioral disorders: Secondary | ICD-10-CM

## 2024-03-13 DIAGNOSIS — N189 Chronic kidney disease, unspecified: Secondary | ICD-10-CM | POA: Diagnosis not present

## 2024-03-13 LAB — CBC
HCT: 34.4 % — ABNORMAL LOW (ref 39.0–52.0)
Hemoglobin: 12 g/dL — ABNORMAL LOW (ref 13.0–17.0)
MCH: 34.3 pg — ABNORMAL HIGH (ref 26.0–34.0)
MCHC: 34.9 g/dL (ref 30.0–36.0)
MCV: 98.3 fL (ref 80.0–100.0)
Platelets: 103 K/uL — ABNORMAL LOW (ref 150–400)
RBC: 3.5 MIL/uL — ABNORMAL LOW (ref 4.22–5.81)
RDW: 12.6 % (ref 11.5–15.5)
WBC: 7.4 K/uL (ref 4.0–10.5)
nRBC: 0 % (ref 0.0–0.2)

## 2024-03-13 LAB — MAGNESIUM: Magnesium: 2.1 mg/dL (ref 1.7–2.4)

## 2024-03-13 LAB — BASIC METABOLIC PANEL WITH GFR
Anion gap: 8 (ref 5–15)
BUN: 22 mg/dL (ref 8–23)
CO2: 23 mmol/L (ref 22–32)
Calcium: 8.1 mg/dL — ABNORMAL LOW (ref 8.9–10.3)
Chloride: 103 mmol/L (ref 98–111)
Creatinine, Ser: 1.28 mg/dL — ABNORMAL HIGH (ref 0.61–1.24)
GFR, Estimated: >60 mL/min (ref >60)
Glucose, Bld: 103 mg/dL — ABNORMAL HIGH (ref 70–99)
Potassium: 4.3 mmol/L (ref 3.5–5.1)
Sodium: 134 mmol/L — ABNORMAL LOW (ref 135–145)

## 2024-03-13 LAB — PHOSPHORUS: Phosphorus: 2.8 mg/dL (ref 2.5–4.6)

## 2024-03-13 MED ORDER — ENOXAPARIN SODIUM 40 MG/0.4ML IJ SOSY
40.0000 mg | PREFILLED_SYRINGE | INTRAMUSCULAR | Status: DC
  Administered 2024-03-13 – 2024-03-16 (×4): 40 mg via SUBCUTANEOUS
  Filled 2024-03-13 (×4): qty 0.4

## 2024-03-13 NOTE — Assessment & Plan Note (Deleted)
 HTN-currently normotensive, added carvedilol  as above.  Consider losartan  for GDMT and blood pressure control if blood pressure can tolerate. Dementia-likely vascular dementia

## 2024-03-13 NOTE — Assessment & Plan Note (Addendum)
 S/P cardiac cath yesterday as recc by cardiology. No complaints. Cardiac cath showed mild stenosis, cards recommended low dose aspirin  and carvedilol . - Continue aspirin  81mg  - Continue carvedilol  3.125mg  BID - Consider ARB for additional GDMT - Cards following

## 2024-03-13 NOTE — Assessment & Plan Note (Deleted)
 Noted at admission. -Wound care: Gerhardt's Butt Cream 2 times a day to surrounding intact skin, will not follow.

## 2024-03-13 NOTE — Assessment & Plan Note (Addendum)
 Tolerating regular diet. - Nutrition consulted: met criteria for severe protein calorie malnutrition - Monitor for refeeding syndrome - Regular diet

## 2024-03-13 NOTE — Assessment & Plan Note (Addendum)
 Currently resolved.  -DuoNebs every 4 hours as needed

## 2024-03-13 NOTE — Assessment & Plan Note (Deleted)
 S/P cardiac cath yesterday as recc by cardiology. Cardiac cath showed mild stenosis, cards recommended aspirin . Doing well. No pain and no other complaints. - Continue aspirin  81mg  - Continue carvedilol  3.125mg  BID - Consider ARB for additional GDMT - Cards following

## 2024-03-13 NOTE — Progress Notes (Signed)
 Physical Therapy Treatment Patient Details Name: Dan Salazar MRN: [id number] DOB: July 21, 1961 Today's Date: 03/13/2024   History of Present Illness Pt is a 63 y.o. male who presented 8/12 with worsening AMS. MRI brain negative for acute intracranial abnormality. S/p cardiac cath 8/14. PMH: dementia, HTN, COPD, prostate cancer, anemia    PT Comments  The pt is demonstrating some progress cognitively and functionally. He was oriented to himself and the location. He utilized the calendar in the room to identify the current date. He recalls some events of the admission but not all the events leading up to his hospitalization. He continues to get frustrated fairly easily, but is redirectable with extra time and encouragement. The pt progressed to ambulating up to ~70 ft with a RW this date, but needed minA for balance and cues to remain proximal to the RW throughout. He had x1 R lateral LOB when turning to the L, needing modA to recover. He remains at high risk for falls and would be unsafe to be home alone. Thus, current recommendations remain appropriate. Will continue to follow acutely.    If plan is discharge home, recommend the following: A little help with walking and/or transfers;A lot of help with bathing/dressing/bathroom;Assistance with cooking/housework;Direct supervision/assist for medications management;Direct supervision/assist for financial management;Help with stairs or ramp for entrance;Assist for transportation;Supervision due to cognitive status   Can travel by private vehicle     Yes  Equipment Recommendations  Rolling walker (2 wheels);Other (comment) (shower/tub seat)    Recommendations for Other Services       Precautions / Restrictions Precautions Precautions: Fall;Other (comment) Recall of Precautions/Restrictions: Impaired Precaution/Restrictions Comments: watch HR Restrictions Weight Bearing Restrictions Per Provider Order: No Other Position/Activity Restrictions:  Per Dr. Claudene 8/14 - Ambulating as tolerated without squatting or straining post cath     Mobility  Bed Mobility Overal bed mobility: Needs Assistance Bed Mobility: Supine to Sit, Sit to Supine     Supine to sit: HOB elevated, Min assist Sit to supine: Contact guard assist, HOB elevated   General bed mobility comments: Pt requesting HHA minA to pull up to sit R EOB from elevated HOB. Pt able to return himself to supine with CGA for safety    Transfers Overall transfer level: Needs assistance Equipment used: Rolling walker (2 wheels) Transfers: Sit to/from Stand Sit to Stand: Min assist           General transfer comment: Pt needed minA to stand from EOB to RW    Ambulation/Gait Ambulation/Gait assistance: Min assist, Mod assist Gait Distance (Feet): 70 Feet Assistive device: Rolling walker (2 wheels) Gait Pattern/deviations: Step-through pattern, Decreased step length - right, Decreased step length - left, Decreased stride length, Trunk flexed, Staggering right Gait velocity: reduced Gait velocity interpretation: <1.31 ft/sec, indicative of household ambulator   General Gait Details: Pt ambulates with a flexed posture, needing cues to remain proximal within his RW. When turning to the L pt staggered and lost balance to the R, needing modA to recover. MinA otherwise for balance while ambulating. VCs provided to improve posture, but no success.   Stairs             Wheelchair Mobility     Tilt Bed    Modified Rankin (Stroke Patients Only) Modified Rankin (Stroke Patients Only) Pre-Morbid Rankin Score: Slight disability Modified Rankin: Moderately severe disability     Balance Overall balance assessment: Needs assistance Sitting-balance support: No upper extremity supported, Feet supported Sitting balance-Leahy Scale: Fair Sitting balance -  Comments: static sitting EOB with supervision for safety   Standing balance support: Bilateral upper extremity  supported, During functional activity, Reliant on assistive device for balance Standing balance-Leahy Scale: Poor Standing balance comment: reliant on RW and external physical assistance, x1 LOB to his R needing modA to recover when ambulating with a RW                            Communication Communication Communication: No apparent difficulties  Cognition Arousal: Alert Behavior During Therapy:  (frustrated)   PT - Cognitive impairments: History of cognitive impairments                       PT - Cognition Comments: Hx of dementia. Oriented to himself and location. While able to recall the MDs are looking at his heart, he does not appear to recall what resulted in his hospitalization. Pt uses calendar to identify the current date. Pt still intermittently frustrated, raising his voice in response to questions or requests, but then apologizes intermittently. Follows simple cues with extra time ~70% of the time Following commands: Impaired Following commands impaired: Follows one step commands with increased time, Follows one step commands inconsistently    Cueing Cueing Techniques: Verbal cues, Tactile cues  Exercises      General Comments General comments (skin integrity, edema, etc.): HR up to 110s      Pertinent Vitals/Pain Pain Assessment Pain Assessment: Faces Faces Pain Scale: No hurt Pain Intervention(s): Monitored during session    Home Living                          Prior Function            PT Goals (current goals can now be found in the care plan section) Acute Rehab PT Goals Patient Stated Goal: to go home PT Goal Formulation: With patient Time For Goal Achievement: 03/25/24 Potential to Achieve Goals: Fair Progress towards PT goals: Progressing toward goals    Frequency    Min 2X/week      PT Plan      Co-evaluation              AM-PAC PT 6 Clicks Mobility   Outcome Measure  Help needed turning from your  back to your side while in a flat bed without using bedrails?: A Little Help needed moving from lying on your back to sitting on the side of a flat bed without using bedrails?: A Little Help needed moving to and from a bed to a chair (including a wheelchair)?: A Little Help needed standing up from a chair using your arms (e.g., wheelchair or bedside chair)?: A Little Help needed to walk in hospital room?: A Lot Help needed climbing 3-5 steps with a railing? : Total 6 Click Score: 15    End of Session Equipment Utilized During Treatment: Gait belt Activity Tolerance: Patient tolerated treatment well Patient left: in bed;with call bell/phone within reach;with bed alarm set Nurse Communication: Mobility status PT Visit Diagnosis: Unsteadiness on feet (R26.81);Other abnormalities of gait and mobility (R26.89);Muscle weakness (generalized) (M62.81);Difficulty in walking, not elsewhere classified (R26.2)     Time: 9060-9041 PT Time Calculation (min) (ACUTE ONLY): 19 min  Charges:    $Therapeutic Activity: 8-22 mins PT General Charges $$ ACUTE PT VISIT: 1 Visit  Theo Ferretti, PT, DPT Acute Rehabilitation Services  Office: [phone]    Theo CHRISTELLA Ferretti 03/13/2024, 11:15 AM

## 2024-03-13 NOTE — Assessment & Plan Note (Addendum)
 Noted at admission. -Wound care: Gerhardt's Butt Cream 2 times a day to surrounding intact skin, will not follow.

## 2024-03-13 NOTE — Progress Notes (Addendum)
 Daily Progress Note Intern Pager: [phone]  Patient name: Dan Salazar Medical record number: [id number] Date of birth: [birth year] Age: 63 y.o. Gender: male  Primary Care Provider: Delores Delorise Lunger, FNP Consultants: Cardiology Code Status: DNR which was confirmed by HCPOA at bedside, who provided us  POA paper-work  Pt Overview and Major Events to Date:  8/12 Admitted 8/14 Cardiac cath  Assessment and Plan: Dan Salazar is a 63 y.o. male with a PMHx of dementia and COPD who presented with altered mental status and acute cough. Plan is SNF as he is at baseline with cough improved except for occasional agitation when questioned. Social work on Theatre manager. S/p cardiac cath. No complaints. Assessment & Plan Altered mental status Improved at baseline AxOx2 except for occasional agitation when asked questions. Overall workup not revealing for cause. Given improvement with IV fluids, and positive THC on admission suspect metabolic insult from dehydration/malnourishment and drug use. -PT/OT following, plan for SNF, pending bed placement -Delirium precautions Heart failure (HCC) S/P cardiac cath yesterday as recc by cardiology. No complaints. Cardiac cath showed mild stenosis, cards recommended low dose aspirin  and carvedilol . - Continue aspirin  81mg  - Continue carvedilol  3.125mg  BID - Consider ARB for additional GDMT - Cards following Cough Currently resolved.  -DuoNebs every 4 hours as needed Protein-calorie malnutrition, severe Tolerating regular diet. - Nutrition consulted: met criteria for severe protein calorie malnutrition - Monitor for refeeding syndrome - Regular diet Pressure injury of sacral region, stage 2 (HCC) Noted at admission. -Wound care: Gerhardt's Butt Cream 2 times a day to surrounding intact skin, will not follow. Chronic health problem HTN-currently normotensive, added carvedilol  as above.  Consider losartan  for GDMT and blood pressure control if blood  pressure can tolerate. Dementia-likely vascular dementia  FEN/GI: Regular diet VTE Prophylaxis: Lovenox    Dispo:SNF today or tomorrow. Barriers include patient's agreement.  Subjective:  63 year old male with improved AMS. At baseline with mentation but is occasionally agitated with questions. Reports wanting to go home. No complaints.  Objective: Temp:  [97.3 F (36.3 C)-98.9 F (37.2 C)] 97.8 F (36.6 C) (08/15 0700) Pulse Rate:  [0-99] 75 (08/15 0700) Resp:  [12-25] 17 (08/15 0700) BP: (109-163)/(68-108) 125/78 (08/15 0700) SpO2:  [86 %-99 %] 97 % (08/15 0700) Weight:  [72 kg-72.9 kg] 72.9 kg (08/15 9361) Physical Exam: General: Laying on bed, occasionally agitated when asked questions, in no acute distress Cardiovascular: RRR, mild distal heart sounds Respiratory: CTAB breathing comfortably Abdomen: No TTP, soft Extremities: No deformities Neuro: AxOx2 to self and location. Pysch: Occasional agitation.  Laboratory: Most recent CBC Lab Results  Component Value Date   WBC 7.4 03/13/2024   HGB 12.0 (L) 03/13/2024   HCT 34.4 (L) 03/13/2024   MCV 98.3 03/13/2024   PLT 103 (L) 03/13/2024   Most recent BMP    Latest Ref Rng & Units 03/13/2024    2:30 AM  BMP  Glucose 70 - 99 mg/dL 896   BUN 8 - 23 mg/dL 22   Creatinine 9.38 - 1.24 mg/dL 8.71   Sodium 864 - 854 mmol/L 134   Potassium 3.5 - 5.1 mmol/L 4.3   Chloride 98 - 111 mmol/L 103   CO2 22 - 32 mmol/L 23   Calcium  8.9 - 10.3 mg/dL 8.1     Other pertinent labs Gluc 105>103 Phos 2.5>2,.8 Mag 1.8>2.1 GFR 48>60  Hgb 11.6>11.9>12.0 Plts 99>103  Imaging/Diagnostic Tests: Radiologist Impression:  Cardiac cath:   Mid LAD lesion is 30% stenosed.  Prox Cx lesion is 40% stenosed.   Mid RCA lesion is 10% stenosed.   There is mild left ventricular systolic dysfunction.   LV end diastolic pressure is low.   LV end diastolic pressure is normal.   The left ventricular ejection fraction is 45-50% by visual  estimate.   Recommend Aspirin  81mg  daily for moderate CAD.  Medical treatment with low dose B-blocker if tolerated, Low dose ACE or ARB if renal function improves and Statins, diet and exercise.  Chest x-ray: 1. Unchanged left lung base scarring. No acute findings. Comparison 01/2021   Head CT w/o contrast 1. No acute intracranial abnormality. 2. Moderate generalized cerebral and cerebellar volume loss, extensive cerebral white matter disease, and chronic lacunar infarcts within the basal ganglia bilaterally (worse on the left) and thalami bilaterally. 3. Ovoid, circumscribed, and partially calcified lesion in the left parietal scalp, likely representing a sebaceous cyst.   MR Brain w/o contrast 1. No acute intracranial abnormality. 2. Age-related cerebral atrophy with moderately advanced chronic microvascular ischemic disease, with multiple remote lacunar infarcts involving the bilateral basal ganglia, thalami, and pons. 3. 2.3 cm nodular lesion at the left parietal scalp, slightly increased as compared to prior MRI from 05/04/2021. Findings indeterminate, and could reflect a sebaceous or trichilemmal cyst.   KUB 1. Gaseous distention of the colon to the level of the rectum. Stool burden is low. 2. Small left pleural effusion.  [name] [name], Medical Student 03/13/2024, 7:26 AM  AI, [facility] FPTS Intern pager: [phone], text pages welcome Secure chat group [facility] Teaching Service   I was personally present and performed or re-performed the history, physical exam and medical decision making activities of this service and have verified that the service and findings are accurately documented in the student's note.  Maryn Freelove, DO                  03/13/2024, 1:17 PM

## 2024-03-13 NOTE — Assessment & Plan Note (Addendum)
 HTN-currently normotensive, added carvedilol  as above.  Consider losartan  for GDMT and blood pressure control if blood pressure can tolerate. Dementia-likely vascular dementia

## 2024-03-13 NOTE — Assessment & Plan Note (Addendum)
 Improved at baseline AxOx2 except for occasional agitation when asked questions. Overall workup not revealing for cause. Given improvement with IV fluids, and positive THC on admission suspect metabolic insult from dehydration/malnourishment and drug use. -PT/OT following, plan for SNF, pending bed placement -Delirium precautions

## 2024-03-13 NOTE — Plan of Care (Signed)
 Went to evaluate the patient and update POA at bedside with Dr. Cleotilde. Lupita says he is feeling well other than being stuck in a hospital, and has no complaints. When asked where he will be going once he is discharged, he said home. We discussed with Lupita the best course of action is to complete 4-6 weeks of rehabilitation at SNF to gain strength to return home. We believe this will put him at decreased risk of re-hospitalization, and Lupita was agreeable to this. POA says he is working on bed placement and patient is medically ready for discharge on our end.  POA emphasized he believed it would be beneficial for the patient to be able to acclimate at SNF for a few days before escalating measures like anxiolytics. We discussed SNFs will be able to redirect Lupita and help him acclimate to the new facility.

## 2024-03-13 NOTE — Assessment & Plan Note (Deleted)
 Improved at baseline. Overall workup not revealing for cause. Given improvement with IV fluids, and positive THC on admission suspect metabolic insult from dehydration/malnourishment and drug use. -PT/OT following, plan for SNF

## 2024-03-13 NOTE — Assessment & Plan Note (Deleted)
 History of COPD, not take inhalers at home. Breathing on room air comfortably on exam today. Chest x-ray no acute findings. -DuoNebs every 4 hours as needed

## 2024-03-13 NOTE — Assessment & Plan Note (Deleted)
 Tolerating regular diet. - Nutrition consulted: monitor for refeeding syndrome, met criteria for severe protein calorie malnutrition - Regular diet

## 2024-03-13 NOTE — Consult Note (Signed)
 Ref: Delores Delorise Lunger, FNP   Subjective:  Awake. No chest pain. No right groin swelling, discharge or pain. VS stable. Creatinine is improving.  Objective:  Vital Signs in the last 24 hours: Temp:  [97.3 F (36.3 C)-99.1 F (37.3 C)] 99.1 F (37.3 C) (08/15 1142) Pulse Rate:  [0-99] 76 (08/15 1142) Cardiac Rhythm: Normal sinus rhythm (08/15 0845) Resp:  [12-25] 17 (08/15 1142) BP: (107-163)/(63-108) 107/63 (08/15 1142) SpO2:  [86 %-99 %] 97 % (08/15 1142) Weight:  [72 kg-72.9 kg] 72.9 kg (08/15 9361)  Physical Exam: BP Readings from Last 1 Encounters:  03/13/24 107/63     Wt Readings from Last 1 Encounters:  03/13/24 72.9 kg    Weight change:  Body mass index is 21.2 kg/m. HEENT: [place]/AT, Eyes-Blue, Conjunctiva-Pink, Sclera-Non-icteric Neck: No JVD, No bruit, Trachea midline. Lungs:  Clear, Bilateral. Cardiac:  Regular rhythm, normal S1 and S2, no S3. II/VI systolic murmur. Abdomen:  Soft, non-tender. BS present. Extremities:  No edema present. No cyanosis. No clubbing.Right groin without pain, swelling or discharge. CNS: AxOx3, Cranial nerves grossly intact, moves all 4 extremities.  Skin: Warm and dry.   Intake/Output from previous day: 08/14 0701 - 08/15 0700 In: 1123 [P.O.:620; I.V.:3; NG/GT:500] Out: -     Lab Results: BMET    Component Value Date/Time   NA 134 (L) 03/13/2024 0230   NA 134 (L) 03/12/2024 1412   NA 134 (L) 03/12/2024 1403   K 4.3 03/13/2024 0230   K 3.9 03/12/2024 1412   K 4.0 03/12/2024 1403   CL 103 03/13/2024 0230   CL 97 (L) 03/12/2024 0230   CL 96 (L) 03/11/2024 0043   CO2 23 03/13/2024 0230   CO2 29 03/12/2024 0230   CO2 25 03/11/2024 0043   GLUCOSE 103 (H) 03/13/2024 0230   GLUCOSE 105 (H) 03/12/2024 0230   GLUCOSE 139 (H) 03/11/2024 0043   BUN 22 03/13/2024 0230   BUN 24 (H) 03/12/2024 0230   BUN 28 (H) 03/11/2024 0043   CREATININE 1.28 (H) 03/13/2024 0230   CREATININE 1.60 (H) 03/12/2024 0230   CREATININE 1.49  (H) 03/11/2024 0043   CALCIUM  8.1 (L) 03/13/2024 0230   CALCIUM  8.6 (L) 03/12/2024 0230   CALCIUM  8.5 (L) 03/11/2024 0043   GFRNONAA >60 03/13/2024 0230   GFRNONAA 48 (L) 03/12/2024 0230   GFRNONAA 52 (L) 03/11/2024 0043   GFRAA >60 05/15/2018 1423   GFRAA >60 11/13/2017 0309   GFRAA >60 11/11/2017 0630   CBC    Component Value Date/Time   WBC 7.4 03/13/2024 0230   RBC 3.50 (L) 03/13/2024 0230   HGB 12.0 (L) 03/13/2024 0230   HCT 34.4 (L) 03/13/2024 0230   PLT 103 (L) 03/13/2024 0230   MCV 98.3 03/13/2024 0230   MCH 34.3 (H) 03/13/2024 0230   MCHC 34.9 03/13/2024 0230   RDW 12.6 03/13/2024 0230   LYMPHSABS 1.3 05/15/2018 1423   MONOABS 0.3 05/15/2018 1423   EOSABS 0.0 05/15/2018 1423   BASOSABS 0.0 05/15/2018 1423   HEPATIC Function Panel Recent Labs    03/10/24 1326  PROT 6.7  ALBUMIN  3.8  AST 18  ALT 21  ALKPHOS 61   HEMOGLOBIN A1C Lab Results  Component Value Date   MPG 128.37 11/01/2017   CARDIAC ENZYMES Lab Results  Component Value Date   TROPONINI <0.03 11/01/2017   TROPONINI 0.18 (HH) 10/31/2017   TROPONINI <0.03 09/28/2016   BNP No results for input(s): PROBNP in the last 8760 hours. TSH  Recent Labs    03/10/24 2216  TSH 1.126   CHOLESTEROL No results for input(s): CHOL in the last 8760 hours.  Scheduled Meds:  aspirin   81 mg Oral Daily   carvedilol   3.125 mg Oral BID WC   enoxaparin  (LOVENOX ) injection  40 mg Subcutaneous Q24H   feeding supplement  237 mL Oral TID BM   folic acid   1 mg Oral Daily   Gerhardt's butt cream   Topical BID   multivitamin with minerals  1 tablet Oral Daily   sodium chloride  flush  3 mL Intravenous Q12H   tamsulosin   0.4 mg Oral QHS   thiamine   100 mg Oral Daily   Continuous Infusions:  sodium chloride      PRN Meds:.sodium chloride , acetaminophen , ipratropium-albuterol , ondansetron  (ZOFRAN ) IV, sodium chloride  flush  Assessment/Plan: Chronic systolic left heart failure Ischemic heart disease   Multivessel, native vessel CAD HTN CKD, IIIa COPD Dementia  Plan: Increase activity as tolerated. Patient understood not to squat or strain for 5 days, May take bandage off in 2 days and no sitting in tub x 1 week, standing shower is okay.   LOS: 3 days   Time spent including chart review, lab review, examination, discussion with patient :  min   Dan Negri  MD  03/13/2024, 12:05 PM

## 2024-03-14 DIAGNOSIS — E46 Unspecified protein-calorie malnutrition: Secondary | ICD-10-CM | POA: Diagnosis not present

## 2024-03-14 DIAGNOSIS — R4182 Altered mental status, unspecified: Secondary | ICD-10-CM | POA: Diagnosis not present

## 2024-03-14 DIAGNOSIS — I5023 Acute on chronic systolic (congestive) heart failure: Secondary | ICD-10-CM | POA: Diagnosis not present

## 2024-03-14 DIAGNOSIS — Z8659 Personal history of other mental and behavioral disorders: Secondary | ICD-10-CM | POA: Diagnosis not present

## 2024-03-14 LAB — LIPOPROTEIN A (LPA): Lipoprotein (a): 156.6 nmol/L — ABNORMAL HIGH (ref <75.0)

## 2024-03-14 LAB — MAGNESIUM: Magnesium: 1.9 mg/dL (ref 1.7–2.4)

## 2024-03-14 LAB — PHOSPHORUS: Phosphorus: 3.2 mg/dL (ref 2.5–4.6)

## 2024-03-14 MED ORDER — LOSARTAN POTASSIUM 25 MG PO TABS
12.5000 mg | ORAL_TABLET | Freq: Every day | ORAL | Status: DC
  Administered 2024-03-14 – 2024-03-17 (×4): 12.5 mg via ORAL
  Filled 2024-03-14 (×5): qty 0.5

## 2024-03-14 NOTE — Assessment & Plan Note (Signed)
 Noted at admission. -Wound care: Gerhardt's Butt Cream 2 times a day to surrounding intact skin, will not follow.

## 2024-03-14 NOTE — Plan of Care (Signed)

## 2024-03-14 NOTE — Assessment & Plan Note (Signed)
 HTN-currently normotensive, added carvedilol  as above.  Consider losartan  for GDMT and blood pressure control if blood pressure can tolerate. Dementia-likely vascular dementia

## 2024-03-14 NOTE — Progress Notes (Signed)
 Subjective:  Patient denies any chest pain or shortness of breath Eager to go home  Objective:  Vital Signs in the last 24 hours: Temp:  [98 F (36.7 C)-99.2 F (37.3 C)] 98.4 F (36.9 C) (08/16 0849) Pulse Rate:  [72-83] 79 (08/16 0849) Resp:  [16-18] 18 (08/16 0849) BP: (100-125)/(55-82) 125/82 (08/16 0849) SpO2:  [96 %-100 %] 99 % (08/16 0849)  Intake/Output from previous day: 08/15 0701 - 08/16 0700 In: 837 [P.O.:837] Out: 450 [Urine:450] Intake/Output from this shift: Total I/O In: 120 [P.O.:120] Out: -   Physical Exam: Neck: no adenopathy, no carotid bruit, no JVD, and supple, symmetrical, trachea midline Lungs: clear to auscultation bilaterally Heart: regular rate and rhythm, S1, S2 normal, and 2/6 systolic murmur noted Abdomen: soft, non-tender; bowel sounds normal; no masses,  no organomegaly Extremities: extremities normal, atraumatic, no cyanosis or edema and right groin stable  Lab Results: Recent Labs    03/12/24 1412 03/13/24 0230  WBC  --  7.4  HGB 11.9* 12.0*  PLT  --  103*   Recent Labs    03/12/24 0230 03/12/24 1403 03/12/24 1412 03/13/24 0230  NA 135   < > 134* 134*  K 3.5   < > 3.9 4.3  CL 97*  --   --  103  CO2 29  --   --  23  GLUCOSE 105*  --   --  103*  BUN 24*  --   --  22  CREATININE 1.60*  --   --  1.28*   < > = values in this interval not displayed.   No results for input(s): TROPONINI in the last 72 hours.  Invalid input(s): CK, MB Hepatic Function Panel No results for input(s): PROT, ALBUMIN , AST, ALT, ALKPHOS, BILITOT, BILIDIR, IBILI in the last 72 hours. No results for input(s): CHOL in the last 72 hours. No results for input(s): PROTIME in the last 72 hours.  Imaging: Imaging results have been reviewed and CARDIAC CATHETERIZATION Result Date: 03/12/2024   Mid LAD lesion is 30% stenosed.   Prox Cx lesion is 40% stenosed.   Mid RCA lesion is 10% stenosed.   There is mild left ventricular systolic  dysfunction.   LV end diastolic pressure is low.   LV end diastolic pressure is normal.   The left ventricular ejection fraction is 45-50% by visual estimate.   Recommend Aspirin  81mg  daily for moderate CAD. Medical treatment with low dose B-blocker if tolerated, Low dose ACE or ARB if renal function improves and Statins, diet and exercise.    Cardiac Studies:  Assessment/Plan:  Compensated systolic congestive heart failure Nonischemic cardiomyopathy Nonobstructive multivessel CAD Hypertension COPD Chronic kidney disease stage IIIa improved Dementia Agitation Plan Add low-dose losartan  as per orders Okay to discharge from cardiac point of view Will add GDMT as tolerated as outpatient Follow-up with Dr. Claudene in 1 to 2 weeks  LOS: 4 days    Dan Salazar 03/14/2024, 9:44 AM

## 2024-03-14 NOTE — Assessment & Plan Note (Signed)
 Improved at baseline AxOx2 except for occasional agitation when asked questions. Overall workup not revealing for cause. Given improvement with IV fluids, and positive THC on admission suspect metabolic insult from dehydration/malnourishment and drug use. -PT/OT following, plan for SNF, pending bed placement -Delirium precautions

## 2024-03-14 NOTE — Progress Notes (Addendum)
     Daily Progress Note Intern Pager: [phone]  Patient name: Dan Salazar Medical record number: [id number] Date of birth: Oct 29, 1960 Age: 63 y.o. Gender: male  Primary Care Provider: Delores Delorise Lunger, FNP Consultants: cardiology Code Status: DNR  Pt Overview and Major Events to Date:  8/12 Admitted 8/14 Cardiac cath  Medical Decision Making:  Dan Salazar is a 63 y.o. male with a PMHx of dementia and COPD who presented with altered mental status and acute cough. Plan is SNF as he is at baseline with cough improved except for occasional agitation when questioned. Social work on Theatre manager. S/p cardiac cath. No complaints.  Assessment & Plan Altered mental status Improved at baseline AxOx2 except for occasional agitation when asked questions. Overall workup not revealing for cause. Given improvement with IV fluids, and positive THC on admission suspect metabolic insult from dehydration/malnourishment and drug use. -PT/OT following, plan for SNF, pending bed placement -Delirium precautions Heart failure (HCC) S/P cardiac cath yesterday as recc by cardiology. No complaints. Cardiac cath showed mild stenosis, cards recommended low dose aspirin  and carvedilol . - Continue aspirin  81mg  - Continue carvedilol  3.125mg  BID - Consider ARB for additional GDMT - Cards following Cough Currently resolved.  -DuoNebs every 4 hours as needed Protein-calorie malnutrition, severe Tolerating regular diet. - Nutrition consulted: met criteria for severe protein calorie malnutrition - Monitor for refeeding syndrome - Regular diet Pressure injury of sacral region, stage 2 (HCC) Noted at admission. -Wound care: Gerhardt's Butt Cream 2 times a day to surrounding intact skin, will not follow. Chronic health problem HTN-currently normotensive, added carvedilol  as above.  Consider losartan  for GDMT and blood pressure control if blood pressure can tolerate. Dementia-likely vascular dementia  FEN/GI:  regular diet PPx: lovenox  Dispo:SNF awaiting bed placement  Subjective:  morning rounds. Patient resting comfortably in bed, no new complaints.   Objective: Temp:  [97.8 F (36.6 C)-99.2 F (37.3 C)] 98.2 F (36.8 C) (08/15 2100) Pulse Rate:  [72-83] 72 (08/15 2100) Resp:  [16-17] 16 (08/15 2100) BP: (100-125)/(55-78) 100/55 (08/15 2100) SpO2:  [96 %-100 %] 96 % (08/15 2100) Weight:  [160 lb 11.5 oz (72.9 kg)] 160 lb 11.5 oz (72.9 kg) (08/15 9361) Physical Exam: General: resting comfortably in bed, NAD Cardiovascular: RRR, no r/m/g Respiratory: CTAB  Laboratory: Most recent CBC Lab Results  Component Value Date   WBC 7.4 03/13/2024   HGB 12.0 (L) 03/13/2024   HCT 34.4 (L) 03/13/2024   MCV 98.3 03/13/2024   PLT 103 (L) 03/13/2024   Most recent BMP    Latest Ref Rng & Units 03/13/2024    2:30 AM  BMP  Glucose 70 - 99 mg/dL 896   BUN 8 - 23 mg/dL 22   Creatinine 9.38 - 1.24 mg/dL 8.71   Sodium 864 - 854 mmol/L 134   Potassium 3.5 - 5.1 mmol/L 4.3   Chloride 98 - 111 mmol/L 103   CO2 22 - 32 mmol/L 23   Calcium  8.9 - 10.3 mg/dL 8.1     Idelle Nakai, DO 03/14/2024, 6:34 AM  PGY-1, [place] [facility] FPTS Intern pager: [phone], text pages welcome Secure chat group [facility] Teaching Service    I have personally seen and examined this patient and reviewed their chart. I have discussed this patient with the resident. I agree with the assessment and plan as outlined.   Medically stable for discharge to SNF once bed available.  Donald Lai, DO Family Medicine Teaching Service

## 2024-03-14 NOTE — Assessment & Plan Note (Signed)
 S/P cardiac cath yesterday as recc by cardiology. No complaints. Cardiac cath showed mild stenosis, cards recommended low dose aspirin  and carvedilol . - Continue aspirin  81mg  - Continue carvedilol  3.125mg  BID - Consider ARB for additional GDMT - Cards following

## 2024-03-14 NOTE — Assessment & Plan Note (Signed)
 Currently resolved.  -DuoNebs every 4 hours as needed

## 2024-03-14 NOTE — Assessment & Plan Note (Signed)
 Tolerating regular diet. - Nutrition consulted: met criteria for severe protein calorie malnutrition - Monitor for refeeding syndrome - Regular diet

## 2024-03-15 DIAGNOSIS — Z8659 Personal history of other mental and behavioral disorders: Secondary | ICD-10-CM | POA: Diagnosis not present

## 2024-03-15 DIAGNOSIS — R4182 Altered mental status, unspecified: Secondary | ICD-10-CM | POA: Diagnosis not present

## 2024-03-15 DIAGNOSIS — E43 Unspecified severe protein-calorie malnutrition: Secondary | ICD-10-CM | POA: Diagnosis not present

## 2024-03-15 DIAGNOSIS — N1832 Chronic kidney disease, stage 3b: Secondary | ICD-10-CM

## 2024-03-15 DIAGNOSIS — L89152 Pressure ulcer of sacral region, stage 2: Secondary | ICD-10-CM | POA: Diagnosis not present

## 2024-03-15 LAB — PHOSPHORUS: Phosphorus: 3.8 mg/dL (ref 2.5–4.6)

## 2024-03-15 LAB — MAGNESIUM: Magnesium: 1.9 mg/dL (ref 1.7–2.4)

## 2024-03-15 LAB — BASIC METABOLIC PANEL WITH GFR
Anion gap: 9 (ref 5–15)
BUN: 28 mg/dL — ABNORMAL HIGH (ref 8–23)
CO2: 26 mmol/L (ref 22–32)
Calcium: 8.1 mg/dL — ABNORMAL LOW (ref 8.9–10.3)
Chloride: 99 mmol/L (ref 98–111)
Creatinine, Ser: 1.24 mg/dL (ref 0.61–1.24)
GFR, Estimated: >60 mL/min (ref >60)
Glucose, Bld: 102 mg/dL — ABNORMAL HIGH (ref 70–99)
Potassium: 4.1 mmol/L (ref 3.5–5.1)
Sodium: 134 mmol/L — ABNORMAL LOW (ref 135–145)

## 2024-03-15 NOTE — Assessment & Plan Note (Signed)
 HTN-currently normotensive, added carvedilol  as above.  Consider losartan  for GDMT and blood pressure control if blood pressure can tolerate. Dementia-likely vascular dementia

## 2024-03-15 NOTE — Progress Notes (Signed)
 Subjective:  Denies chest pain or shortness of breath renal function improving tolerating ARB  Objective:  Vital Signs in the last 24 hours: Temp:  [98 F (36.7 C)-99 F (37.2 C)] 98.1 F (36.7 C) (08/17 0816) Pulse Rate:  [72-84] 76 (08/17 0816) Resp:  [18-20] 18 (08/17 0816) BP: (87-125)/(54-84) 109/84 (08/17 0816) SpO2:  [95 %-98 %] 98 % (08/17 0816)  Intake/Output from previous day: 08/16 0701 - 08/17 0700 In: 363 [P.O.:360; I.V.:3] Out: 300 [Urine:300] Intake/Output from this shift: Total I/O In: -  Out: 1200 [Urine:1200]  Physical Exam: Exam unchanged  Lab Results: Recent Labs    03/12/24 1412 03/13/24 0230  WBC  --  7.4  HGB 11.9* 12.0*  PLT  --  103*   Recent Labs    03/13/24 0230 03/15/24 0452  NA 134* 134*  K 4.3 4.1  CL 103 99  CO2 23 26  GLUCOSE 103* 102*  BUN 22 28*  CREATININE 1.28* 1.24   No results for input(s): TROPONINI in the last 72 hours.  Invalid input(s): CK, MB Hepatic Function Panel No results for input(s): PROT, ALBUMIN , AST, ALT, ALKPHOS, BILITOT, BILIDIR, IBILI in the last 72 hours. No results for input(s): CHOL in the last 72 hours. No results for input(s): PROTIME in the last 72 hours.  Imaging: Imaging results have been reviewed and No results found.  Cardiac Studies:  Assessment/Plan:  Compensated systolic congestive heart failure Nonischemic cardiomyopathy Nonobstructive multivessel CAD Hypertension COPD Chronic kidney disease stage IIIa improved Dementia Agitation Plan Continue present management I will sign off please call if needed  follow-up with Dr. Claudene in 1 to 2 weeks  LOS: 5 days    Dan Salazar 03/15/2024, 9:30 AM

## 2024-03-15 NOTE — Plan of Care (Signed)
   Problem: Education: Goal: Knowledge of General Education information will improve Description: Including pain rating scale, medication(s)/side effects and non-pharmacologic comfort measures Outcome: Progressing   Problem: Clinical Measurements: Goal: Will remain free from infection Outcome: Progressing

## 2024-03-15 NOTE — Assessment & Plan Note (Addendum)
 Negative cath, cards signed off with medication recs as below.  Plan to add more GDMT outpatient. - Continue aspirin  81mg  - Continue carvedilol  3.125mg  BID, losartan  12.5 mg daily

## 2024-03-15 NOTE — Progress Notes (Addendum)
     Daily Progress Note Intern Pager: [phone]  Patient name: Dan Salazar Medical record number: [id number] Date of birth: 11-28-1960 Age: 63 y.o. Gender: male  Primary Care Provider: Delores Delorise Lunger, Dan Salazar Consultants: Cardiology (signed off) Code Status: DNR  Pt Overview and Major Events to Date:  8/12 Admitted 8/14 Cardiac cath (negative)  Assessment and Plan: Dan Salazar is a 63 y.o. male with a PMHx of dementia and COPD who presented with altered mental status and acute cough.   Now medically stable for discharge to SNF. Assessment & Plan Altered mental status Some agitation this a.m., appears to be at baseline mental status per prior report.  Medically stable for discharge at now pending SNF placement -PT/OT following, plan for SNF, pending bed placement -Delirium precautions Heart failure (HCC) Negative cath, cards signed off with medication recs as below.  Plan to add more GDMT outpatient. - Continue aspirin  81mg  - Continue carvedilol  3.125mg  BID, losartan  12.5 mg daily Protein-calorie malnutrition, severe Refeeding labs negative x 4 days.  Will defer additional monitoring and continue on regular diet. Pressure injury of sacral region, stage 2 (HCC) Noted at admission. -Wound care: Gerhardt's Butt Cream 2 times a day to surrounding intact skin, will not follow. Cough (Resolved: 03/15/2024) -DuoNebs every 4 hours as needed Chronic health problem HTN-currently normotensive, added carvedilol  as above.  Consider losartan  for GDMT and blood pressure control if blood pressure can tolerate. Dementia-likely vascular dementia   FEN/GI: regular diet PPx: lovenox  Dispo:SNF awaiting bed placement  Subjective:  Assessed at bedside, arouses to voice.  Some agitation with physical exam but calms down with redirection.  Per nursing staff waxes and wanes.  Objective: Temp:  [98 F (36.7 C)-99 F (37.2 C)] 98 F (36.7 C) (08/17 0417) Pulse Rate:  [72-84] 72 (08/17  0417) Resp:  [18-20] 20 (08/17 0417) BP: (87-125)/(54-82) 114/64 (08/17 0417) SpO2:  [95 %-99 %] 96 % (08/17 0417) Physical Exam: General: Well-appearing. Alert. NAD CV: RRR without murmur Pulm: CTAB. Normal WOB on RA. No wheezing Abdomen: Soft, non-tender, non-distended. +BS Ext: No peripheral edema  Laboratory: Most recent CBC Lab Results  Component Value Date   WBC 7.4 03/13/2024   HGB 12.0 (L) 03/13/2024   HCT 34.4 (L) 03/13/2024   MCV 98.3 03/13/2024   PLT 103 (L) 03/13/2024   Most recent BMP    Latest Ref Rng & Units 03/15/2024    4:52 AM  BMP  Glucose 70 - 99 mg/dL 897   BUN 8 - 23 mg/dL 28   Creatinine 9.38 - 1.24 mg/dL 8.75   Sodium 864 - 854 mmol/L 134   Potassium 3.5 - 5.1 mmol/L 4.1   Chloride 98 - 111 mmol/L 99   CO2 22 - 32 mmol/L 26   Calcium  8.9 - 10.3 mg/dL 8.1     Other pertinent labs: Mag: 1.9 Phos: 3.8  Imaging/Diagnostic Tests: None in the past 24 hours  Theophilus Pagan, MD 03/15/2024, 5:59 AM  PGY-3, [facility] Health Family Medicine FPTS Intern pager: [phone], text pages welcome Secure chat group [facility] Teaching Service

## 2024-03-15 NOTE — Assessment & Plan Note (Addendum)
-  DuoNebs every 4 hours as needed

## 2024-03-15 NOTE — Assessment & Plan Note (Signed)
 Noted at admission. -Wound care: Gerhardt's Butt Cream 2 times a day to surrounding intact skin, will not follow.

## 2024-03-15 NOTE — Assessment & Plan Note (Addendum)
 Refeeding labs negative x 4 days.  Will defer additional monitoring and continue on regular diet.

## 2024-03-15 NOTE — Assessment & Plan Note (Addendum)
 Some agitation this a.m., appears to be at baseline mental status per prior report.  Medically stable for discharge at now pending SNF placement -PT/OT following, plan for SNF, pending bed placement -Delirium precautions

## 2024-03-16 DIAGNOSIS — R4182 Altered mental status, unspecified: Secondary | ICD-10-CM | POA: Diagnosis not present

## 2024-03-16 DIAGNOSIS — N189 Chronic kidney disease, unspecified: Secondary | ICD-10-CM | POA: Diagnosis not present

## 2024-03-16 DIAGNOSIS — E43 Unspecified severe protein-calorie malnutrition: Secondary | ICD-10-CM | POA: Diagnosis not present

## 2024-03-16 DIAGNOSIS — L89152 Pressure ulcer of sacral region, stage 2: Secondary | ICD-10-CM | POA: Diagnosis not present

## 2024-03-16 NOTE — Assessment & Plan Note (Addendum)
 HTN -currently normotensive, continue carvedilol .  Consider losartan  for GDMT and blood pressure control if blood pressure can tolerate. Dementia -likely vascular dementia BPH - continue tamsulosin  0.4 mg at night

## 2024-03-16 NOTE — Progress Notes (Signed)
 Mobility Specialist Progress Note:    03/16/24 1402  Mobility  Activity  (x10 leg extension and x10 ankle pumps)  Level of Assistance Standby assist, set-up cues, supervision of patient - no hands on  Range of Motion/Exercises Right leg;Left leg  Activity Response Tolerated well  Mobility Referral Yes  Mobility visit 1 Mobility  Mobility Specialist Start Time (ACUTE ONLY) 1342  Mobility Specialist Stop Time (ACUTE ONLY) 1354  Mobility Specialist Time Calculation (min) (ACUTE ONLY) 12 min   Received pt in bed and declined ambulation, reason unspecified. Pt had BM and was unaware; MS assisted with cleaning pt up. Pt was agreeable to bed-level mobility which pt completed without physical assistance. Personal belongings and call light within reach. All needs met.  Lavanda Pollack Mobility Specialist  Please contact via [employer] or  Rehab Office [phone]

## 2024-03-16 NOTE — TOC Progression Note (Signed)
 Transition of Care [facility]) - Progression Note    Patient Details  Name: Albin Duckett MRN: [id number] Date of Birth: [birth year]  Transition of Care [facility]) CM/SW Contact  Lauraine FORBES Saa, LCSWA Phone Number: 03/16/2024, 3:22 PM  Clinical Narrative:     3:22 PM CSW Luise emailed patient's friend/HCPOA, [name] ([email or web address]) patient's SNF options (patient was not and is still not fully oriented) with their Medicare ratings. Madeleine called this CSW today and informed her of SNF decision of [facility]. CSW informed [facility] of bed acceptance. SNF confirmed they can admit patient tomorrow. CSW relayed information to medical team.  Expected Discharge Plan: Skilled Nursing Facility Barriers to Discharge: Continued Medical Work up               Expected Discharge Plan and Services In-house Referral: Clinical Social Work   Post Acute Care Choice: Skilled Nursing Facility Living arrangements for the past 2 months: Single Family Home                                       Social Drivers of Health (SDOH) Interventions SDOH Screenings   Food Insecurity: Patient Unable To Answer (03/11/2024)  Housing: Unknown (03/11/2024)  Transportation Needs: Patient Declined (03/11/2024)  Utilities: Patient Declined (03/11/2024)  Social Connections: Patient Declined (03/11/2024)  Tobacco Use: Low Risk  (03/10/2024)    Readmission Risk Interventions     No data to display

## 2024-03-16 NOTE — Assessment & Plan Note (Signed)
 Noted at admission. -Wound care: Gerhardt's Butt Cream 2 times a day to surrounding intact skin, will not follow.

## 2024-03-16 NOTE — Plan of Care (Signed)
  Problem: Clinical Measurements: Goal: Ability to maintain clinical measurements within normal limits will improve Outcome: Adequate for Discharge   Problem: Clinical Measurements: Goal: Respiratory complications will improve Outcome: Adequate for Discharge   

## 2024-03-16 NOTE — Plan of Care (Signed)

## 2024-03-16 NOTE — Assessment & Plan Note (Addendum)
 Still some irritability this morning, appears to be at baseline mental status. Medically stable for discharge- now pending SNF placement -PT/OT following, plan for SNF, pending bed placement -Delirium precautions

## 2024-03-16 NOTE — Progress Notes (Addendum)
     Daily Progress Note Intern Pager: [phone]  Patient name: Dan Salazar Medical record number: [id number] Date of birth: 1960/10/08 Age: 63 y.o. Gender: male  Primary Care Provider: Delores Delorise Lunger, FNP Consultants: cardiology Code Status: DNR  Pt Overview and Major Events to Date:  8/12 Admitted 8/14 Cardiac cath  Medical Decision Making: Dan Salazar is a 63 y.o. male with a PMHx of dementia and COPD who presented with altered mental status and acute cough.   Medically stable for discharge- pending SNF placement Assessment & Plan Altered mental status Still some irritability this morning, appears to be at baseline mental status. Medically stable for discharge- now pending SNF placement -PT/OT following, plan for SNF, pending bed placement -Delirium precautions Heart failure (HCC) Negative cath, cards signed off with medication recs as below.  Plan to add more GDMT outpatient. - Continue aspirin  81mg  - Continue carvedilol  3.125mg  BID, losartan  12.5 mg daily Pressure injury of sacral region, stage 2 (HCC) Noted at admission. -Wound care: Gerhardt's Butt Cream 2 times a day to surrounding intact skin, will not follow. Chronic health problem HTN -currently normotensive, continue carvedilol .  Consider losartan  for GDMT and blood pressure control if blood pressure can tolerate. Dementia -likely vascular dementia BPH - continue tamsulosin  0.4 mg at night  FEN/GI: regular PPx: lovenox  Dispo:SNF today. Barriers include awaiting bed placement.   Subjective:  patient resting comfortably in bed. He has no new complaints of concerns at this time. He says he would like to get out of the hospital.   Objective: Temp:  [97.4 F (36.3 C)-98.2 F (36.8 C)] 97.8 F (36.6 C) (08/18 0600) Pulse Rate:  [61-81] 61 (08/18 0600) Resp:  [16-18] 16 (08/18 0600) BP: (106-136)/(64-84) 106/64 (08/18 0600) SpO2:  [94 %-100 %] 96 % (08/18 0600) Physical Exam: General: resting in  bed comfortably, NAD  Cardiovascular: RRR, no r/m/g Respiratory: CTAB, normal work of breathing on RA  Laboratory: Most recent CBC Lab Results  Component Value Date   WBC 7.4 03/13/2024   HGB 12.0 (L) 03/13/2024   HCT 34.4 (L) 03/13/2024   MCV 98.3 03/13/2024   PLT 103 (L) 03/13/2024   Most recent BMP    Latest Ref Rng & Units 03/15/2024    4:52 AM  BMP  Glucose 70 - 99 mg/dL 897   BUN 8 - 23 mg/dL 28   Creatinine 9.38 - 1.24 mg/dL 8.75   Sodium 864 - 854 mmol/L 134   Potassium 3.5 - 5.1 mmol/L 4.1   Chloride 98 - 111 mmol/L 99   CO2 22 - 32 mmol/L 26   Calcium  8.9 - 10.3 mg/dL 8.1     Idelle Nakai, DO 03/16/2024, 7:08 AM  PGY-1, [facility] Family Medicine FPTS Intern pager: [phone], text pages welcome Secure chat group [facility] Teaching Service

## 2024-03-16 NOTE — Assessment & Plan Note (Deleted)
 Refeeding labs negative x 4 days.  Will defer additional monitoring and continue on regular diet.

## 2024-03-16 NOTE — Assessment & Plan Note (Signed)
 Negative cath, cards signed off with medication recs as below.  Plan to add more GDMT outpatient. - Continue aspirin  81mg  - Continue carvedilol  3.125mg  BID, losartan  12.5 mg daily

## 2024-03-16 NOTE — Discharge Summary (Incomplete)
 Family Medicine Teaching [facility] Discharge Summary  Patient name: Dan Salazar Medical record number: [id number] Date of birth: 1960-10-07 Age: 63 y.o. Gender: male Date of Admission: 03/10/2024  Date of Discharge: 03/17/2024 Admitting Physician: Gladis Church, DO  Primary Care Provider: Delores Delorise Gladis, FNP Consultants: cardiology  Indication for Hospitalization: AMS  Discharge Diagnoses/Problem List:  Principal Problem for Admission: AMS Other Problems addressed during stay:  Principal Problem:   Altered mental status Active Problems:   CKD3b   Malnourished (HCC)   Pressure injury of sacral region, stage 2 (HCC)   Chronic health problem   Heart failure (HCC)   History of dementia   Failure to thrive in adult   Protein-calorie malnutrition, severe   ***The above problem list has been updated and reviewed for accuracy, including the initial reason for admission.   Brief Hospital Course:  Dan Salazar is a 63 y.o.male with a history of vascular dementia, COPD, HTN who was admitted to the [facility] Medicine Teaching Service at [facility] for AMS. His hospital course is detailed below:  AMS:  Patient was brought to the ED by his neighbor (power of attorney) who noted patient was altered from baseline and disheveled. In the ED work-up was unremarkable, including CT head, MRI brain, UA, and CMP/CBC. MRI brain with chronic ischemic infarcts, white/gray matter atrophy (consistent with his known dementia) and no acute abnormalities. Of note UDS positive for THC, which may have contributed to initial presentation. Ultimately, infectious work-up was negative and patient returned to baseline with IV fluids and adequate nutrition. Suspect AMS related to progression of his dementia, substance use and acute metabolic insult. PT/OT recommend SNF for short-term rehab with planned long-term placement.   Congestive Heart Failure (LEVF 40-45%) Echocardiogram obtained for dyspnea,  tachycardia distant heart sounds and trace pitting edema. Newly worsened LVEF (40-45%) with regional wall abnormalities.  Patient underwent right and left heart cath on 8/14 which showed some stenosis of mid LAD, proximal circumferential, mid RCA and mild left ventricular systolic dysfunction.  He was started on aspirin  81 mg, carvedilol , and already on ARB.  Will follow-up with cardiology outpatient.  Cough Acute cough, in setting of known COPD not on inhalers. Not in COPD exacerbation. CXR negative for acute findings. Ultimately resolved at time of D/C. Recommend OP follow-up with PCP for management of COPD  Malnourished RD consulted and followed.  Monitored for refeeding syndrome.  Was at baseline at time of discharge.  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 Recent Labs  Lab 03/15/24 0452  NA 134*  K 4.1  CL 99  CO2 26  GLUCOSE 102*  BUN 28*  CREATININE 1.24  CALCIUM  8.1*  MG 1.9  PHOS 3.8   Chest x-ray: 1. Unchanged  left lung base scarring. No acute findings. Comparison 01/2021   Head CT w/o contrast 1. No acute intracranial abnormality. 2. Moderate generalized cerebral and cerebellar volume loss, extensive cerebral white  matter disease, and chronic lacunar infarcts within the basal ganglia bilaterally (worse on the left) and thalami bilaterally. 3. Ovoid, circumscribed, and partially calcified lesion in the left parietal scalp, likely representing a sebaceous cyst.   MR Brain w/o contrast 1. No acute intracranial abnormality. 2. Age-related cerebral atrophy with moderately advanced chronic microvascular ischemic disease, with multiple remote lacunar infarcts involving the bilateral basal ganglia, thalami, and pons. 3. 2.3 cm nodular lesion at the left parietal scalp, slightly increased as compared to prior MRI from 05/04/2021. Findings indeterminate, and could reflect a sebaceous or trichilemmal cyst.   KUB 1. Gaseous distention of the colon to the level of the rectum. Stool burden is low. 2. Small left pleural effusion.   Discharge Medications:  Allergies as of 03/16/2024       Reactions   Hctz [hydrochlorothiazide] Other (See Comments)   Severe electrolyte abnormalities     Med Rec must be completed prior to using this [facility]***       Discharge Instructions: Please refer to Patient Instructions section of EMR for full details.  Patient was counseled important signs and symptoms that should prompt return to medical care, changes in medications, dietary instructions, activity restrictions, and follow up appointments.   Follow-Up Appointments:  Follow-up Information     Claudene Pacific, MD Follow up in 1 week(s).   Specialty: Cardiology Why: f/u c. cath. Contact information: [address] [place] [address] [phone]                 Idelle Nakai, DO 03/16/2024, 3:56 PM PGY-1, [facility] Health Family Medicine

## 2024-03-17 MED ORDER — CARVEDILOL 3.125 MG PO TABS: 3.1250 mg | ORAL_TABLET | Freq: Two times a day (BID) | ORAL | Status: AC

## 2024-03-17 MED ORDER — LOSARTAN POTASSIUM 25 MG PO TABS: 12.5000 mg | ORAL_TABLET | Freq: Every day | ORAL | Status: AC

## 2024-03-17 MED ORDER — ASPIRIN 81 MG PO CHEW: 81.0000 mg | CHEWABLE_TABLET | Freq: Every day | ORAL | Status: AC

## 2024-03-17 NOTE — Assessment & Plan Note (Addendum)
 Negative cath,  Plan to add more GDMT outpatient. - Continue aspirin  81mg  - Continue carvedilol  3.125mg  BID, losartan  12.5 mg daily

## 2024-03-17 NOTE — Care Management Important Message (Signed)
 Important Message  Patient Details  Name: Dan Salazar MRN: [id number] Date of Birth: February 28, 1961   Important Message Given:  Yes - Medicare IM  Patient left prior to IM delivery will mail a copy to the patient home address.    Atticus Wedin 03/17/2024, 12:48 PM

## 2024-03-17 NOTE — Assessment & Plan Note (Addendum)
 appears to be at baseline mental status.  -PT/OT following, plan for SNF today -Delirium precautions

## 2024-03-17 NOTE — Care Management Important Message (Signed)
 Important Message  Patient Details  Name: Dan Salazar MRN: [id number] Date of Birth: 31-Jul-1960   Important Message Given:  Yes - Medicare IM     Claretta Deed 03/17/2024, 5:17 PM

## 2024-03-17 NOTE — Assessment & Plan Note (Signed)
 HTN -currently normotensive, continue carvedilol .  Consider losartan  for GDMT and blood pressure control if blood pressure can tolerate. Dementia -likely vascular dementia BPH - continue tamsulosin  0.4 mg at night

## 2024-03-17 NOTE — Progress Notes (Signed)
     Daily Progress Note Intern Pager: [phone]  Patient name: Keyonte Cookston Medical record number: [id number] Date of birth: Nov 30, 1960 Age: 63 y.o. Gender: male  Primary Care Provider: Delores Delorise Lunger, FNP Consultants: cardiology Code Status: DNR  Pt Overview and Major Events to Date:  8/12 Admitted 8/14 Cardiac cath  Medical Decision Making: Tylor Courtwright is a 63 y.o. male with a PMHx of dementia and COPD who presented with altered mental status.  Medically stable for discharge- now pending SNF placement Assessment & Plan Altered mental status appears to be at baseline mental status.  -PT/OT following, plan for SNF today -Delirium precautions Heart failure (HCC) Negative cath,  Plan to add more GDMT outpatient. - Continue aspirin  81mg  - Continue carvedilol  3.125mg  BID, losartan  12.5 mg daily Pressure injury of sacral region, stage 2 (HCC) Noted at admission. -Wound care: Gerhardt's Butt Cream 2 times a day to surrounding intact skin, will not follow. Chronic health problem HTN -currently normotensive, continue carvedilol .  Consider losartan  for GDMT and blood pressure control if blood pressure can tolerate. Dementia -likely vascular dementia BPH - continue tamsulosin  0.4 mg at night   FEN/GI: regular PPx: lovenox  Dispo:SNF today.   Subjective:  Patient was asleep resting in bed on exam this morning.  He awoke and was excited breakfast was ready.  He is denying abdominal pain, SOB, CP and has no complaints today.   Objective: Temp:  [97.6 F (36.4 C)-98.9 F (37.2 C)] 98.2 F (36.8 C) (08/19 0433) Pulse Rate:  [78-91] 80 (08/19 0433) Resp:  [17-18] 17 (08/19 0433) BP: (92-130)/(57-87) 130/78 (08/19 0433) SpO2:  [95 %-98 %] 95 % (08/19 0433) Physical Exam: General: Resting in bed, NAD Cardiovascular: RRR, no r/m/g Respiratory: CTAB, normal work of breathing on room air Abdomen: Soft, nontender, nondistended  Laboratory: Most recent CBC Lab Results   Component Value Date   WBC 7.4 03/13/2024   HGB 12.0 (L) 03/13/2024   HCT 34.4 (L) 03/13/2024   MCV 98.3 03/13/2024   PLT 103 (L) 03/13/2024   Most recent BMP    Latest Ref Rng & Units 03/15/2024    4:52 AM  BMP  Glucose 70 - 99 mg/dL 897   BUN 8 - 23 mg/dL 28   Creatinine 9.38 - 1.24 mg/dL 8.75   Sodium 864 - 854 mmol/L 134   Potassium 3.5 - 5.1 mmol/L 4.1   Chloride 98 - 111 mmol/L 99   CO2 22 - 32 mmol/L 26   Calcium  8.9 - 10.3 mg/dL 8.1     Idelle Nakai, DO 03/17/2024, 7:22 AM  PGY-1, [facility] FPTS Intern pager: [phone], text pages welcome Secure chat group [facility] Teaching Service

## 2024-03-17 NOTE — TOC Transition Note (Signed)
 Transition of Care [facility]) - Discharge Note   Patient Details  Name: Dan Salazar MRN: [id number] Date of Birth: 04-14-1961  Transition of Care [facility]) CM/SW Contact:  Lauraine FORBES Saa, LCSWA Phone Number: 03/17/2024, 11:44 AM   Clinical Narrative:     Patient will DC to: [facility] Anticipated DC date: 03/17/2024 Family notified: Madeleine Clause; Friend/HCPOA; [phone] Transport by: [facility]   Per MD patient ready for DC to [facility]. RN to call report prior to discharge [phone] RN, patient's friend/HCPOA, and facility notified of DC (patient has vascular dementia). Discharge Summary and FL2 sent to facility. DC packet on chart. Ambulance transport requested for patient at 10:58.   CSW will sign off for now as social work intervention is no longer needed. Please consult us  again if new needs arise.    Final next level of care: Skilled Nursing Facility Barriers to Discharge: Barriers Resolved   Patient Goals and CMS Choice Patient states their goals for this hospitalization and ongoing recovery are:: Pt's HCPOA [name] stated for pt to go to rehab and get better CMS Medicare.gov Compare Post Acute Care list provided to:: Patient Represenative (must comment) (Pt's HCPOA, [name]) Choice offered to / list presented to : [facility] POA / Guardian      Discharge Placement              Patient [facility] bed at: [facility] Patient to be transferred to facility by: [facility] Name of family member notified: Madeleine Clause; Friend/HCPOA; [phone] Patient and family notified of of transfer: 03/17/24  Discharge Plan and Services Additional resources added to the After Visit Summary for   In-house Referral: Clinical Social Work   Post Acute Care Choice: Skilled Nursing Facility                               Social Drivers of Health (SDOH) Interventions SDOH Screenings   Food Insecurity: Patient Unable To Answer (03/11/2024)  Housing: Unknown  (03/11/2024)  Transportation Needs: Patient Declined (03/11/2024)  Utilities: Patient Declined (03/11/2024)  Social Connections: Patient Declined (03/11/2024)  Tobacco Use: Low Risk  (03/10/2024)     Readmission Risk Interventions     No data to display

## 2024-03-17 NOTE — Progress Notes (Signed)
 Attempted to call report to SNF- RN unavailable to take call. Left message w/ number to call back for report

## 2024-03-17 NOTE — Assessment & Plan Note (Signed)
 Noted at admission. -Wound care: Gerhardt's Butt Cream 2 times a day to surrounding intact skin, will not follow.

## 2024-03-17 NOTE — Plan of Care (Signed)
